# Patient Record
Sex: Male | Born: 1955 | Race: Black or African American | Hispanic: No | Smoking: Current every day smoker
Health system: Southern US, Community
[De-identification: ages and names within clinical notes are randomized; demographics above are authoritative.]

## PROBLEM LIST (undated history)

## (undated) DIAGNOSIS — I739 Peripheral vascular disease, unspecified: Secondary | ICD-10-CM

## (undated) DIAGNOSIS — H409 Unspecified glaucoma: Secondary | ICD-10-CM

## (undated) DIAGNOSIS — I509 Heart failure, unspecified: Secondary | ICD-10-CM

## (undated) DIAGNOSIS — N189 Chronic kidney disease, unspecified: Secondary | ICD-10-CM

## (undated) DIAGNOSIS — I779 Disorder of arteries and arterioles, unspecified: Secondary | ICD-10-CM

## (undated) DIAGNOSIS — I255 Ischemic cardiomyopathy: Secondary | ICD-10-CM

## (undated) DIAGNOSIS — I5020 Unspecified systolic (congestive) heart failure: Secondary | ICD-10-CM

## (undated) DIAGNOSIS — H5440 Blindness, one eye, unspecified eye: Secondary | ICD-10-CM

## (undated) DIAGNOSIS — E119 Type 2 diabetes mellitus without complications: Secondary | ICD-10-CM

## (undated) DIAGNOSIS — I10 Essential (primary) hypertension: Secondary | ICD-10-CM

## (undated) DIAGNOSIS — I2510 Atherosclerotic heart disease of native coronary artery without angina pectoris: Secondary | ICD-10-CM

## (undated) HISTORY — DX: Unspecified systolic (congestive) heart failure: I50.20

---

## 2020-03-27 ENCOUNTER — Ambulatory Visit (HOSPITAL_COMMUNITY)
Admission: EM | Admit: 2020-03-27 | Discharge: 2020-03-27 | Disposition: A | Discharge status: Home / Self Care | Payer: Self-pay

## 2020-03-27 ENCOUNTER — Other Ambulatory Visit: Payer: Self-pay

## 2020-03-27 ENCOUNTER — Encounter (HOSPITAL_COMMUNITY): Payer: Self-pay

## 2020-03-27 ENCOUNTER — Inpatient Hospital Stay (HOSPITAL_COMMUNITY)
Admission: EM | Admit: 2020-03-27 | Discharge: 2020-04-07 | DRG: 233 | Disposition: A | Discharge status: Home / Self Care | Payer: Self-pay | Attending: Cardiothoracic Surgery | Admitting: Cardiothoracic Surgery

## 2020-03-27 ENCOUNTER — Emergency Department (HOSPITAL_COMMUNITY): Payer: Self-pay

## 2020-03-27 DIAGNOSIS — I130 Hypertensive heart and chronic kidney disease with heart failure and stage 1 through stage 4 chronic kidney disease, or unspecified chronic kidney disease: Secondary | ICD-10-CM | POA: Diagnosis present

## 2020-03-27 DIAGNOSIS — F17210 Nicotine dependence, cigarettes, uncomplicated: Secondary | ICD-10-CM | POA: Diagnosis present

## 2020-03-27 DIAGNOSIS — N179 Acute kidney failure, unspecified: Secondary | ICD-10-CM | POA: Diagnosis present

## 2020-03-27 DIAGNOSIS — I255 Ischemic cardiomyopathy: Secondary | ICD-10-CM | POA: Diagnosis present

## 2020-03-27 DIAGNOSIS — Z7984 Long term (current) use of oral hypoglycemic drugs: Secondary | ICD-10-CM

## 2020-03-27 DIAGNOSIS — E1169 Type 2 diabetes mellitus with other specified complication: Secondary | ICD-10-CM | POA: Diagnosis present

## 2020-03-27 DIAGNOSIS — Z09 Encounter for follow-up examination after completed treatment for conditions other than malignant neoplasm: Secondary | ICD-10-CM

## 2020-03-27 DIAGNOSIS — K5900 Constipation, unspecified: Secondary | ICD-10-CM | POA: Diagnosis not present

## 2020-03-27 DIAGNOSIS — I440 Atrioventricular block, first degree: Secondary | ICD-10-CM | POA: Diagnosis present

## 2020-03-27 DIAGNOSIS — Z20822 Contact with and (suspected) exposure to covid-19: Secondary | ICD-10-CM | POA: Diagnosis present

## 2020-03-27 DIAGNOSIS — Z9889 Other specified postprocedural states: Secondary | ICD-10-CM

## 2020-03-27 DIAGNOSIS — L97509 Non-pressure chronic ulcer of other part of unspecified foot with unspecified severity: Secondary | ICD-10-CM | POA: Diagnosis present

## 2020-03-27 DIAGNOSIS — E1122 Type 2 diabetes mellitus with diabetic chronic kidney disease: Secondary | ICD-10-CM | POA: Diagnosis present

## 2020-03-27 DIAGNOSIS — I509 Heart failure, unspecified: Secondary | ICD-10-CM

## 2020-03-27 DIAGNOSIS — E119 Type 2 diabetes mellitus without complications: Secondary | ICD-10-CM

## 2020-03-27 DIAGNOSIS — R609 Edema, unspecified: Secondary | ICD-10-CM

## 2020-03-27 DIAGNOSIS — Z79899 Other long term (current) drug therapy: Secondary | ICD-10-CM

## 2020-03-27 DIAGNOSIS — M86672 Other chronic osteomyelitis, left ankle and foot: Secondary | ICD-10-CM | POA: Diagnosis present

## 2020-03-27 DIAGNOSIS — Z951 Presence of aortocoronary bypass graft: Secondary | ICD-10-CM

## 2020-03-27 DIAGNOSIS — D62 Acute posthemorrhagic anemia: Secondary | ICD-10-CM | POA: Diagnosis not present

## 2020-03-27 DIAGNOSIS — I5043 Acute on chronic combined systolic (congestive) and diastolic (congestive) heart failure: Secondary | ICD-10-CM | POA: Diagnosis present

## 2020-03-27 DIAGNOSIS — R0602 Shortness of breath: Secondary | ICD-10-CM

## 2020-03-27 DIAGNOSIS — M86671 Other chronic osteomyelitis, right ankle and foot: Secondary | ICD-10-CM | POA: Diagnosis present

## 2020-03-27 DIAGNOSIS — D631 Anemia in chronic kidney disease: Secondary | ICD-10-CM | POA: Diagnosis present

## 2020-03-27 DIAGNOSIS — E11621 Type 2 diabetes mellitus with foot ulcer: Secondary | ICD-10-CM | POA: Diagnosis present

## 2020-03-27 DIAGNOSIS — I25110 Atherosclerotic heart disease of native coronary artery with unstable angina pectoris: Principal | ICD-10-CM

## 2020-03-27 DIAGNOSIS — I10 Essential (primary) hypertension: Secondary | ICD-10-CM | POA: Diagnosis present

## 2020-03-27 DIAGNOSIS — L97519 Non-pressure chronic ulcer of other part of right foot with unspecified severity: Secondary | ICD-10-CM | POA: Diagnosis present

## 2020-03-27 DIAGNOSIS — N183 Chronic kidney disease, stage 3 unspecified: Secondary | ICD-10-CM | POA: Diagnosis present

## 2020-03-27 DIAGNOSIS — F1290 Cannabis use, unspecified, uncomplicated: Secondary | ICD-10-CM | POA: Diagnosis present

## 2020-03-27 DIAGNOSIS — L84 Corns and callosities: Secondary | ICD-10-CM | POA: Diagnosis present

## 2020-03-27 DIAGNOSIS — E785 Hyperlipidemia, unspecified: Secondary | ICD-10-CM | POA: Diagnosis present

## 2020-03-27 DIAGNOSIS — L97529 Non-pressure chronic ulcer of other part of left foot with unspecified severity: Secondary | ICD-10-CM | POA: Diagnosis present

## 2020-03-27 HISTORY — DX: Essential (primary) hypertension: I10

## 2020-03-27 HISTORY — DX: Type 2 diabetes mellitus without complications: E11.9

## 2020-03-27 LAB — CBC
HCT: 34 % — ABNORMAL LOW (ref 39.0–52.0)
Hemoglobin: 10.8 g/dL — ABNORMAL LOW (ref 13.0–17.0)
MCH: 31.8 pg (ref 26.0–34.0)
MCHC: 31.8 g/dL (ref 30.0–36.0)
MCV: 100 fL (ref 80.0–100.0)
Platelets: 286 10*3/uL (ref 150–400)
RBC: 3.4 MIL/uL — ABNORMAL LOW (ref 4.22–5.81)
RDW: 12.6 % (ref 11.5–15.5)
WBC: 6.7 10*3/uL (ref 4.0–10.5)
nRBC: 0 % (ref 0.0–0.2)

## 2020-03-27 LAB — BASIC METABOLIC PANEL
Anion gap: 10 (ref 5–15)
BUN: 19 mg/dL (ref 8–23)
CO2: 23 mmol/L (ref 22–32)
Calcium: 8.8 mg/dL — ABNORMAL LOW (ref 8.9–10.3)
Chloride: 105 mmol/L (ref 98–111)
Creatinine, Ser: 1.3 mg/dL — ABNORMAL HIGH (ref 0.61–1.24)
GFR, Estimated: >60 mL/min (ref >60)
Glucose, Bld: 122 mg/dL — ABNORMAL HIGH (ref 70–99)
Potassium: 4.3 mmol/L (ref 3.5–5.1)
Sodium: 138 mmol/L (ref 135–145)

## 2020-03-27 LAB — BRAIN NATRIURETIC PEPTIDE: B Natriuretic Peptide: 1383.1 pg/mL — ABNORMAL HIGH (ref 0.0–100.0)

## 2020-03-27 MED ORDER — INSULIN ASPART 100 UNIT/ML ~~LOC~~ SOLN
0.0000 [IU] | Freq: Three times a day (TID) | SUBCUTANEOUS | Status: DC
  Administered 2020-03-28: 10:00:00 3 [IU] via SUBCUTANEOUS
  Administered 2020-03-29: 8 [IU] via SUBCUTANEOUS
  Administered 2020-03-30: 3 [IU] via SUBCUTANEOUS
  Administered 2020-03-30 (×2): 2 [IU] via SUBCUTANEOUS
  Administered 2020-03-31: 3 [IU] via SUBCUTANEOUS
  Administered 2020-03-31: 2 [IU] via SUBCUTANEOUS
  Administered 2020-03-31 – 2020-04-02 (×5): 3 [IU] via SUBCUTANEOUS

## 2020-03-27 MED ORDER — ACETAMINOPHEN 325 MG PO TABS: 650.0000 mg | ORAL_TABLET | ORAL | Status: DC | PRN

## 2020-03-27 MED ORDER — FUROSEMIDE 10 MG/ML IJ SOLN
40.0000 mg | Freq: Every day | INTRAMUSCULAR | Status: DC
  Administered 2020-03-28: 40 mg via INTRAVENOUS
  Filled 2020-03-27: qty 4

## 2020-03-27 MED ORDER — ENOXAPARIN SODIUM 40 MG/0.4ML ~~LOC~~ SOLN
40.0000 mg | SUBCUTANEOUS | Status: DC
  Administered 2020-03-27: 40 mg via SUBCUTANEOUS
  Filled 2020-03-27: qty 0.4

## 2020-03-27 MED ORDER — SODIUM CHLORIDE 0.9% FLUSH: 3.0000 mL | INTRAVENOUS | Status: DC | PRN

## 2020-03-27 MED ORDER — ONDANSETRON HCL 4 MG/2ML IJ SOLN: 4.0000 mg | Freq: Four times a day (QID) | INTRAMUSCULAR | Status: DC | PRN

## 2020-03-27 MED ORDER — SODIUM CHLORIDE 0.9 % IV SOLN
250.0000 mL | INTRAVENOUS | Status: DC | PRN
  Administered 2020-03-29: 250 mL via INTRAVENOUS

## 2020-03-27 MED ORDER — FUROSEMIDE 10 MG/ML IJ SOLN
40.0000 mg | Freq: Once | INTRAMUSCULAR | Status: AC
  Administered 2020-03-27: 40 mg via INTRAVENOUS
  Filled 2020-03-27: qty 4

## 2020-03-27 MED ORDER — LISINOPRIL 5 MG PO TABS
5.0000 mg | ORAL_TABLET | Freq: Every day | ORAL | Status: DC
  Administered 2020-03-28 – 2020-03-30 (×3): 5 mg via ORAL
  Filled 2020-03-27 (×3): qty 1

## 2020-03-27 MED ORDER — SODIUM CHLORIDE 0.9% FLUSH
3.0000 mL | Freq: Two times a day (BID) | INTRAVENOUS | Status: DC
  Administered 2020-03-27 – 2020-03-28 (×2): 3 mL via INTRAVENOUS

## 2020-03-27 NOTE — ED Provider Notes (Addendum)
[facility]    CSN: [id number] Arrival date & time: 03/27/20  1426      History   Chief Complaint Chief Complaint  Patient presents with  . Hypotension  . Shortness of Breath  . Leg Swelling    HPI Curtis Watkins is a 65 y.o. male.   HPI   SOB: Pt states that he has had SOB, hypotension, leg swelling for the past 5 days. He reports that first he noticed hypotension episodes at home. He then held his hctz 25 mg medication for the last 5 days which he helped some. He then noticed weight gain of over 2 pounds per day, SOB when laying down, fatigue, peripheral edema and mild cough. Symptoms have not improved. He denies chest pains, weeping lesions of lower legs, productive cough.   Past Medical History:  Diagnosis Date  . Diabetes mellitus without complication (HCC)   . Hypertension     There are no problems to display for this patient.   History reviewed. No pertinent surgical history.     Home Medications    Prior to Admission medications   Medication Sig Start Date End Date Taking? Authorizing Provider  metFORMIN (GLUCOPHAGE) 1000 MG tablet Take by mouth. 12/12/19  Yes [provider]  hydrochlorothiazide (HYDRODIURIL) 25 MG tablet Take 25 mg by mouth daily. 12/12/19   [provider]    Family History History reviewed. No pertinent family history.  Social History Social History   Tobacco Use  . Smoking status: Current Every Day Smoker    Packs/day: 1.00    Types: Cigarettes  . Smokeless tobacco: Never Used  Substance Use Topics  . Alcohol use: Yes  . Drug use: Yes    Frequency: 3.0 times per week    Types: Marijuana     Allergies   Patient has no known allergies.   Review of Systems Review of Systems  As stated above in HPI  Physical Exam Triage Vital Signs ED Triage Vitals  Enc Vitals Group     BP 03/27/20 1444 115/73     Pulse Rate 03/27/20 1444 88     Resp 03/27/20 1444 (!) 28     Temp 03/27/20 1444 97.9 F  (36.6 C)     Temp Source 03/27/20 1444 Oral     SpO2 03/27/20 1444 100 %     Weight --      Height --      Head Circumference --      Peak Flow --      Pain Score 03/27/20 1441 0     Pain Loc --      Pain Edu? --      Excl. in GC? --    No data found.  Updated Vital Signs BP 115/73 (BP Location: Right Arm)   Pulse 88   Temp 97.9 F (36.6 C) (Oral)   Resp (!) 28   SpO2 100%  Resp repeated on exam were 22  Physical Exam Vitals and nursing note reviewed.  Constitutional:      General: He is not in acute distress.    Appearance: He is well-developed. He is not ill-appearing, toxic-appearing or diaphoretic.  HENT:     Head: Normocephalic.     Mouth/Throat:     Mouth: Mucous membranes are moist.  Cardiovascular:     Rate and Rhythm: Normal rate and regular rhythm.     Heart sounds: No murmur heard. No gallop.   Pulmonary:     Effort:  Pulmonary effort is normal. No tachypnea, bradypnea or respiratory distress.     Breath sounds: No stridor. Examination of the right-lower field reveals rhonchi. Examination of the left-lower field reveals rhonchi. Rhonchi present.  Chest:     Chest wall: No mass, deformity, tenderness, crepitus or edema.  Abdominal:     Palpations: Abdomen is soft.     Tenderness: There is no rebound.  Musculoskeletal:     Right lower leg: Edema present.     Left lower leg: Edema present.  Skin:    General: Skin is dry.  Neurological:     Mental Status: He is alert.      UC Treatments / Results  Labs (all labs ordered are listed, but only abnormal results are displayed) Labs Reviewed - No data to display  EKG   Radiology No results found.  Procedures Procedures (including critical care time)  Medications Ordered in UC Medications - No data to display  Initial Impression / Assessment and Plan / UC Course  I have reviewed the triage vital signs and the nursing notes.  Pertinent labs & imaging results that were available during my care  of the patient were reviewed by me and considered in my medical decision making (see chart for details).     New. Likely new onset CHF. Discussed with patient along with recommendation for treatment in the ER. Pt agreeable. Discussed red flag symptoms. He wishes to go via private vehicle.    Final Clinical Impressions(s) / UC Diagnoses   Final diagnoses:  Peripheral edema  SOB (shortness of breath)     Discharge Instructions     Please go directly to the ER    ED Prescriptions    None     PDMP not reviewed this encounter.   Rushie Chestnut, PA-C 03/27/20 [address] [place] 03/27/20 [phone]

## 2020-03-27 NOTE — Discharge Instructions (Addendum)
Please go directly to the ER.

## 2020-03-27 NOTE — ED Triage Notes (Addendum)
Pt presents with bilateral swelling in legs, shortness of breath when laying down and starts coughing days, shortness of breath on exertion x 2 days. Pt was told by the nurse at the PCP to be evaluated at the ED or UC, as PCP is out of the office. Denies chest pain, vision changes, weakness, fatigue, headache.  Pt took hydrochlorothiazide 25 mg, last time 6 days ago, as his blood pressure was low 80/47, 70/45.

## 2020-03-27 NOTE — ED Notes (Signed)
Patient transported to X-ray 

## 2020-03-27 NOTE — ED Triage Notes (Signed)
Patient sent from urgent care to be evaluated for bilateral leg swelling and SOB x 2 days. patient has no hx of CHF. Denies CP. Vaccinated and denies fever

## 2020-03-27 NOTE — ED Provider Notes (Signed)
[facility] EMERGENCY DEPARTMENT Provider Note   CSN: [id number] Arrival date & time: 03/27/20  1629     History No chief complaint on file.   Curtis Watkins is a 65 y.o. male with PMH of HTN and DM who presents the ED sent from urgent care for bilateral leg swelling and shortness of breath.    On my examination, patient reports that over the course the past few days, he has been feeling short of breath, particular with exertion.  He states that when he lays down flat at night, he feels as though he cannot breathe.  He also reports a nonproductive cough worse when lying flat.  He has noticed significant lower extremity swelling and edema which is new for him.  He denies any history of clots or clotting disorder.  No recent hemoptysis.  He reports that he experienced chills and cold symptoms a few weeks ago, but his symptoms improved.  He is fully immunized for COVID-19 including multiple booster injections.  Patient also states that he has been self treating his wounds on his feet bilaterally over the course of the past few weeks.  He states that he wanted to follow-up with his primary care provider, but no longer has insurance.  Patient denies any chest pain.  No pleuritic symptoms.  Of note, patient states that he is checking his blood sugars regularly and they have been relatively well controlled with Metformin.  While he had chills associated with his cold symptoms a few weeks ago, no recent fevers or chills.  He states that he had been experiencing hypotension at home and stopped taking his HCTZ medications.  HPI     Past Medical History:  Diagnosis Date  . Diabetes mellitus without complication (HCC)   . Hypertension     There are no problems to display for this patient.   History reviewed. No pertinent surgical history.     No family history on file.  Social History   Tobacco Use  . Smoking status: Current Every Day Smoker    Packs/day: 1.00    Types:  Cigarettes  . Smokeless tobacco: Never Used  Substance Use Topics  . Alcohol use: Yes  . Drug use: Yes    Frequency: 3.0 times per week    Types: Marijuana    Home Medications Prior to Admission medications   Medication Sig Start Date End Date Taking? Authorizing Provider  hydrochlorothiazide (HYDRODIURIL) 25 MG tablet Take 25 mg by mouth daily. 12/12/19   [provider]  metFORMIN (GLUCOPHAGE) 1000 MG tablet Take by mouth. 12/12/19   [provider]    Allergies    Patient has no known allergies.  Review of Systems   Review of Systems  All other systems reviewed and are negative.   Physical Exam Updated Vital Signs BP 129/78   Pulse 79   Temp 98.1 F (36.7 C)   Resp 16   Ht 5\' 11"  (1.803 m)   Wt 74.8 kg   SpO2 99%   BMI 23.01 kg/m   Physical Exam Vitals and nursing note reviewed. Exam conducted with a chaperone present.  Constitutional:      General: He is not in acute distress.    Appearance: He is ill-appearing. He is not toxic-appearing.  HENT:     Head: Normocephalic and atraumatic.  Eyes:     General: No scleral icterus.    Conjunctiva/sclera: Conjunctivae normal.  Cardiovascular:     Rate and Rhythm: Normal rate  and regular rhythm.     Pulses: Normal pulses.     Heart sounds: Normal heart sounds.  Pulmonary:     Effort: Pulmonary effort is normal. No respiratory distress.     Breath sounds: Normal breath sounds. No wheezing or rales.  Musculoskeletal:     Right lower leg: Edema present.     Left lower leg: Edema present.     Comments: 3+ pitting edema in lower extremities bilaterally. Fusiform swelling of second and third digits on right foot with wound on plantar aspect of distal toes.  No significant tenderness. Wound on plantar aspect of left foot. Peripheral pulses intact and symmetric. Can wiggle toes.  Sensation grossly intact throughout.  Skin:    General: Skin is dry.  Neurological:     Mental Status: He is alert.      GCS: GCS eye subscore is 4. GCS verbal subscore is 5. GCS motor subscore is 6.  Psychiatric:        Mood and Affect: Mood normal.        Behavior: Behavior normal.        Thought Content: Thought content normal.          No  ED Results / Procedures / Treatments   Labs (all labs ordered are listed, but only abnormal results are displayed) Labs Reviewed  BASIC METABOLIC PANEL - Abnormal; Notable for the following components:      Result Value   Glucose, Bld 122 (*)    Creatinine, Ser 1.30 (*)    Calcium 8.8 (*)    All other components within normal limits  CBC - Abnormal; Notable for the following components:   RBC 3.40 (*)    Hemoglobin 10.8 (*)    HCT 34.0 (*)    All other components within normal limits  BRAIN NATRIURETIC PEPTIDE - Abnormal; Notable for the following components:   B Natriuretic Peptide 1,383.1 (*)    All other components within normal limits  SARS CORONAVIRUS 2 (TAT 6-24 HRS)  TROPONIN I (HIGH SENSITIVITY)    EKG None  Radiology DG Chest 2 View  Result Date: 03/27/2020 CLINICAL DATA:  65 year old male with shortness of breath. EXAM: CHEST - 2 VIEW COMPARISON:  Chest radiograph dated 03/15/2016 FINDINGS: The small bilateral pleural effusions with bibasilar atelectasis or infiltrate. No pneumothorax. The cardiac silhouette is within limits. No acute osseous pathology. Atherosclerotic calcification of the aorta. IMPRESSION: Small bilateral pleural effusions with bibasilar atelectasis or infiltrate. Electronically Signed   By: Elgie Collard M.D.   On: 03/27/2020 17:23   DG Foot Complete Left  Result Date: 03/27/2020 CLINICAL DATA:  Wounds on the second and third digits EXAM: LEFT FOOT - COMPLETE 3+ VIEW COMPARISON:  None. FINDINGS: There is no evidence of fracture or dislocation. There is no evidence of arthropathy or other focal bone abnormality. Mild soft tissue swelling seen around the third through fifth distal phalanges. IMPRESSION: Negative.  Electronically Signed   By: Jonna Clark M.D.   On: 03/27/2020 23:04   DG Foot Complete Right  Result Date: 03/27/2020 CLINICAL DATA:  Wounds second third digit EXAM: RIGHT FOOT COMPLETE - 3+ VIEW COMPARISON:  None. FINDINGS: There is cortical destruction and erosive type change seen at the distal phalanx the second third digits. There is overlying tissue swelling and nailbed irregularity noted. No displaced fracture is identified. Dorsal soft tissue swelling is seen. 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 Electronically Signed   By: Kandis Fantasia  Avutu M.D.   On: 03/27/2020 23:03    Procedures Procedures   Medications Ordered in ED Medications  furosemide (LASIX) injection 40 mg (has no administration in time range)    ED Course  I have reviewed the triage vital signs and the nursing notes.  Pertinent labs & imaging results that were available during my care of the patient were reviewed by me and considered in my medical decision making (see chart for details).  Clinical Course as of 03/27/20 2321  Wed Mar 27, 2020  2316 I spoke with Dr. Julian Reil.  He will see and admit patient.  His EKG is concerning for ischemia and if his new onset CHF was precipitated by an MI he will need to be admitted by cardiology.  [GG]    Clinical Course User Index [GG] Lorelee New, PA-C   MDM Rules/Calculators/A&P                          Curtis Watkins was evaluated in Emergency Department on 03/27/2020 for the symptoms described in the history of present illness. He was evaluated in the context of the global COVID-19 pandemic, which necessitated consideration that the patient might be at risk for infection with the SARS-CoV-2 virus that causes COVID-19. Institutional protocols and algorithms that pertain to the evaluation of patients at risk for COVID-19 are in a state of rapid change based on information released by regulatory bodies including the CDC and  federal and state organizations. These policies and algorithms were followed during the patient's care in the ED.  I personally reviewed patient's medical chart and all notes from triage and staff during today's encounter. I have also ordered and reviewed all labs and imaging that I felt to be medically necessary in the evaluation of this patient's complaints and with consideration of their with their physical exam. If needed, translation services were available and utilized.   Patient's history physical exam is suggestive of new onset CHF.  Small bilateral effusions noted on chest x-ray.  We will start patient on 40 mg Lasix IV and admit to hospitalist services.  Patient would benefit from echocardiogram.  We will also obtain plain films of feet given diabetic foot ulcers and concern for osteomyelitis.  Patient reported fevers and chills a few weeks ago, but attributed to cold symptoms.  Vital signs stable here in the ED and no leukocytosis.  Do not feel as though cultures or antibiotics are warranted at this time.  He has poor outpatient follow-up given lack of insurance at this time.  Will consult hospitalist given that unassigned medicine has been capped for this evening.  I spoke with Dr. Julian Reil.  He will see and admit.  His EKG is concerning for ischemia and if his new onset CHF was precipitated by an MI he will need to be admitted by cardiology.     Final Clinical Impression(s) / ED Diagnoses Final diagnoses:  Acute congestive heart failure, unspecified heart failure type [facility])    Rx / DC Orders ED Discharge Orders    None       Elvera Maria 03/27/20 2321    Gerhard Munch, MD 03/28/20 0005

## 2020-03-27 NOTE — H&P (Signed)
History and Physical    Curtis Watkins LXB:[id number] DOB: 1955/09/12 DOA: 03/27/2020  PCP: Doran Stabler, NP  Patient coming from: Home  I have personally briefly reviewed patient's old medical records in [facility]  Chief Complaint: SOB, leg swelling  HPI: Curtis Watkins is a 65 y.o. male with medical history significant of DM2, HTN.  Pt sent to ED from UC for new onset of BLE edema and SOB.  Symptoms onset over the past few days.  He had recently stopped taking HCTZ after noting his BP was low at home.  Since that time he has had progressive BLE edema, DOE, orthopnea.  Pt is COVID vaccinated and boosted.  Has had wounds on BLE feet over course of past couple of weeks.  No CP, no abd pain.   ED Course: BNP 1383.  Creat 1.3.  CXR = small B pleural effusions with bibasilar atelectasis or infiltrate.  COVID pending.   Review of Systems: As per HPI, otherwise all review of systems negative.  Past Medical History:  Diagnosis Date  . Diabetes mellitus without complication (HCC)   . Hypertension     History reviewed. No pertinent surgical history.   reports that he has been smoking cigarettes. He has been smoking about 1.00 pack per day. He has never used smokeless tobacco. He reports current alcohol use. He reports current drug use. Frequency: 3.00 times per week. Drug: Marijuana.  No Known Allergies  No family history on file.   Prior to Admission medications   Medication Sig Start Date End Date Taking? Authorizing Provider  hydrochlorothiazide (HYDRODIURIL) 25 MG tablet Take 25 mg by mouth daily. 12/12/19   [provider]  metFORMIN (GLUCOPHAGE) 1000 MG tablet Take by mouth. 12/12/19   [provider]    Physical Exam: Vitals:   03/27/20 1654 03/27/20 1812 03/27/20 2029 03/27/20 2208  BP: 119/81 120/85 110/68 129/78  Pulse: 80 82 77 79  Resp: 20 18 18 16   Temp: 99.2 F (37.3 C)  98.9 F (37.2 C) 98.1 F (36.7 C)  TempSrc: Oral  Oral    SpO2: 100% 100% 99% 99%  Weight: 74.8 kg     Height: 5\' 11"  (1.803 m)       Constitutional: NAD, calm, comfortable Eyes: PERRL, lids and conjunctivae normal ENMT: Mucous membranes are moist. Posterior pharynx clear of any exudate or lesions.Normal dentition.  Neck: normal, supple, no masses, no thyromegaly Respiratory: clear to auscultation bilaterally, no wheezing, no crackles. Normal respiratory effort. No accessory muscle use.  Cardiovascular: Regular rate and rhythm, no murmurs / rubs / gallops. 3+ BLE edema. 2+ pedal pulses. No carotid bruits.  Abdomen: no tenderness, no masses palpated. No hepatosplenomegaly. Bowel sounds positive.  Musculoskeletal: no clubbing / cyanosis. No joint deformity upper and lower extremities. Good ROM, no contractures. Normal muscle tone.  Skin:      Neurologic: CN 2-12 grossly intact. Sensation intact, DTR normal. Strength 5/5 in all 4.  Psychiatric: Normal judgment and insight. Alert and oriented x 3. Normal mood.    Labs on Admission: I have personally reviewed following labs and imaging studies  CBC: Recent Labs  Lab 03/27/20 1701  WBC 6.7  HGB 10.8*  HCT 34.0*  MCV 100.0  PLT 286   Basic Metabolic Panel: Recent Labs  Lab 03/27/20 1701  NA 138  K 4.3  CL 105  CO2 23  GLUCOSE 122*  BUN 19  CREATININE 1.30*  CALCIUM 8.8*   GFR: Estimated Creatinine  Clearance: 60.7 mL/min (A) (by C-G formula based on SCr of 1.3 mg/dL (H)). Liver Function Tests: No results for input(s): AST, ALT, ALKPHOS, BILITOT, PROT, ALBUMIN in the last 168 hours. No results for input(s): LIPASE, AMYLASE in the last 168 hours. No results for input(s): AMMONIA in the last 168 hours. Coagulation Profile: No results for input(s): INR, PROTIME in the last 168 hours. Cardiac Enzymes: No results for input(s): CKTOTAL, CKMB, CKMBINDEX, TROPONINI in the last 168 hours. BNP (last 3 results) No results for input(s): PROBNP in the last 8760 hours. HbA1C: No  results for input(s): HGBA1C in the last 72 hours. CBG: No results for input(s): GLUCAP in the last 168 hours. Lipid Profile: No results for input(s): CHOL, HDL, LDLCALC, TRIG, CHOLHDL, LDLDIRECT in the last 72 hours. Thyroid Function Tests: No results for input(s): TSH, T4TOTAL, FREET4, T3FREE, THYROIDAB in the last 72 hours. Anemia Panel: No results for input(s): VITAMINB12, FOLATE, FERRITIN, TIBC, IRON, RETICCTPCT in the last 72 hours. Urine analysis: No results found for: COLORURINE, APPEARANCEUR, LABSPEC, PHURINE, GLUCOSEU, HGBUR, BILIRUBINUR, KETONESUR, PROTEINUR, UROBILINOGEN, NITRITE, LEUKOCYTESUR  Radiological Exams on Admission: DG Chest 2 View  Result Date: 03/27/2020 CLINICAL DATA:  65 year old male with shortness of breath. EXAM: CHEST - 2 VIEW COMPARISON:  Chest radiograph dated 03/15/2016 FINDINGS: The small bilateral pleural effusions with bibasilar atelectasis or infiltrate. No pneumothorax. The cardiac silhouette is within limits. No acute osseous pathology. Atherosclerotic calcification of the aorta. IMPRESSION: Small bilateral pleural effusions with bibasilar atelectasis or infiltrate. Electronically Signed   By: Elgie Collard M.D.   On: 03/27/2020 17:23   DG Foot Complete Left  Result Date: 03/27/2020 CLINICAL DATA:  Wounds on the second and third digits EXAM: LEFT FOOT - COMPLETE 3+ VIEW COMPARISON:  None. FINDINGS: There is no evidence of fracture or dislocation. There is no evidence of arthropathy or other focal bone abnormality. Mild soft tissue swelling seen around the third through fifth distal phalanges. IMPRESSION: Negative. Electronically Signed   By: Jonna Clark M.D.   On: 03/27/2020 23:04   DG Foot Complete Right  Result Date: 03/27/2020 CLINICAL DATA:  Wounds second third digit EXAM: RIGHT FOOT COMPLETE - 3+ VIEW COMPARISON:  None. FINDINGS: There is cortical destruction and erosive type change seen at the distal phalanx the second third digits. There is  overlying tissue swelling and nailbed irregularity noted. No displaced fracture is identified. Dorsal soft tissue swelling is seen. 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 Electronically Signed   By: Jonna Clark M.D.   On: 03/27/2020 23:03    EKG: Independently reviewed. TWI in lateral leads.  ? LVH.  ? Some amount of early repol vs LVH causing ST elevations in leads V2 and V3?  Assessment/Plan Principal Problem:   New onset of congestive heart failure (HCC) Active Problems:   Diabetic foot ulcers (HCC)   DM2 (diabetes mellitus, type 2) (HCC)   HTN (hypertension)    1. New onset CHF - 1. CHF pathway 2. Lasix 40mg  IV x1 now then 40mg  IV daily 3. BMP daily 4. Strict intake and output 5. 2d echo ordered and pending 6. Tele monitor 7. Trop ordered and pending 1. STEMI felt unlikely by myself and Dr. after reviewing the EKG. 2. Immediate cards consult if significant elevation however. 2. HTN - 1. Will go ahead and start lisinopril 5mg  2. Stop HCTZ 3. Monitor renal function with lisinopril 3. DM2 - 1. Hold metformin 2. Mod scale SSI AC  for the moment 4. Diabetic foot ulcers - 1. No osteo on plain films 2. dont appear grossly infected 3. Wound care consult  DVT prophylaxis: Lovenox Code Status: Full Family Communication: No family in room Disposition Plan: Home after CHF work up Consults called: None Admission status: Admit to inpatient  Severity of Illness: The appropriate patient status for this patient is INPATIENT. Inpatient status is judged to be reasonable and necessary in order to provide the required intensity of service to ensure the patient's safety. The patient's presenting symptoms, physical exam findings, and initial radiographic and laboratory data in the context of their chronic comorbidities is felt to place them at high risk for further clinical deterioration. Furthermore, it is not anticipated  that the patient will be medically stable for discharge from the hospital within 2 midnights of admission. The following factors support the patient status of inpatient.   IP status for new onset CHF.   * I certify that at the point of admission it is my clinical judgment that the patient will require inpatient hospital care spanning beyond 2 midnights from the point of admission due to high intensity of service, high risk for further deterioration and high frequency of surveillance required.*    Ashonte Angelucci M. DO [facility]  How to contact the [facility] Attending or Consulting provider 7A - 7P or covering provider during after hours 7P -7A, for this patient?  1. Check the care team in [facility] and look for a) attending/consulting [facility] provider listed and b) the [facility] team listed 2. Log into [email or web address]  Am[employer] Physician Scheduling and messaging for groups and whole hospitals  On call and physician scheduling software for group practices, residents, hospitalists and other medical providers for call, clinic, rotation and shift schedules. On[employer] is a hospital-wide system for scheduling doctors and paging doctors on call. EasyPlot is for scientific plotting and data analysis.  [email or web address]  and use [facility]'s universal password to access. If you do not have the password, please contact the hospital operator.  3. Locate the [facility] provider you are looking for under [facility] and page to a number that you can be directly reached. 4. If you still have difficulty reaching the provider, please page the [facility] (Director on Call) for the Hospitalists listed on amion for assistance.  03/27/2020, 11:31 PM

## 2020-03-28 ENCOUNTER — Inpatient Hospital Stay (HOSPITAL_COMMUNITY): Payer: Self-pay

## 2020-03-28 DIAGNOSIS — I5041 Acute combined systolic (congestive) and diastolic (congestive) heart failure: Secondary | ICD-10-CM

## 2020-03-28 DIAGNOSIS — I10 Essential (primary) hypertension: Secondary | ICD-10-CM

## 2020-03-28 DIAGNOSIS — I214 Non-ST elevation (NSTEMI) myocardial infarction: Secondary | ICD-10-CM

## 2020-03-28 DIAGNOSIS — L97509 Non-pressure chronic ulcer of other part of unspecified foot with unspecified severity: Secondary | ICD-10-CM

## 2020-03-28 DIAGNOSIS — I5021 Acute systolic (congestive) heart failure: Secondary | ICD-10-CM

## 2020-03-28 LAB — HEMOGLOBIN A1C
Hgb A1c MFr Bld: 6.8 % — ABNORMAL HIGH (ref 4.8–5.6)
Mean Plasma Glucose: 148.46 mg/dL

## 2020-03-28 LAB — ECHOCARDIOGRAM COMPLETE
Area-P 1/2: 3.72 cm2
Calc EF: 41 %
Height: 71 in
S' Lateral: 3.5 cm
Single Plane A2C EF: 41.4 %
Single Plane A4C EF: 40.3 %
Weight: 2640 oz

## 2020-03-28 LAB — TROPONIN I (HIGH SENSITIVITY)
Troponin I (High Sensitivity): 353 ng/L (ref <18)
Troponin I (High Sensitivity): 337 ng/L (ref <18)
Troponin I (High Sensitivity): 309 ng/L (ref <18)

## 2020-03-28 LAB — BASIC METABOLIC PANEL
Anion gap: 8 (ref 5–15)
BUN: 18 mg/dL (ref 8–23)
CO2: 24 mmol/L (ref 22–32)
Calcium: 8.7 mg/dL — ABNORMAL LOW (ref 8.9–10.3)
Chloride: 105 mmol/L (ref 98–111)
Creatinine, Ser: 1.29 mg/dL — ABNORMAL HIGH (ref 0.61–1.24)
GFR, Estimated: >60 mL/min (ref >60)
Glucose, Bld: 115 mg/dL — ABNORMAL HIGH (ref 70–99)
Potassium: 3.9 mmol/L (ref 3.5–5.1)
Sodium: 137 mmol/L (ref 135–145)

## 2020-03-28 LAB — CBC
HCT: 33.7 % — ABNORMAL LOW (ref 39.0–52.0)
Hemoglobin: 11.3 g/dL — ABNORMAL LOW (ref 13.0–17.0)
MCH: 31.7 pg (ref 26.0–34.0)
MCHC: 33.5 g/dL (ref 30.0–36.0)
MCV: 94.7 fL (ref 80.0–100.0)
Platelets: 254 10*3/uL (ref 150–400)
RBC: 3.56 MIL/uL — ABNORMAL LOW (ref 4.22–5.81)
RDW: 12.3 % (ref 11.5–15.5)
WBC: 6.6 10*3/uL (ref 4.0–10.5)
nRBC: 0 % (ref 0.0–0.2)

## 2020-03-28 LAB — SARS CORONAVIRUS 2 (TAT 6-24 HRS): SARS Coronavirus 2: NEGATIVE

## 2020-03-28 LAB — HEPARIN LEVEL (UNFRACTIONATED): Heparin Unfractionated: <0.1 IU/mL — ABNORMAL LOW (ref 0.30–0.70)

## 2020-03-28 LAB — HIV ANTIBODY (ROUTINE TESTING W REFLEX): HIV Screen 4th Generation wRfx: NONREACTIVE

## 2020-03-28 LAB — CREATININE, SERUM
Creatinine, Ser: 1.36 mg/dL — ABNORMAL HIGH (ref 0.61–1.24)
GFR, Estimated: 58 mL/min — ABNORMAL LOW (ref >60)

## 2020-03-28 LAB — CBG MONITORING, ED
Glucose-Capillary: 171 mg/dL — ABNORMAL HIGH (ref 70–99)
Glucose-Capillary: 81 mg/dL (ref 70–99)

## 2020-03-28 LAB — GLUCOSE, CAPILLARY: Glucose-Capillary: 189 mg/dL — ABNORMAL HIGH (ref 70–99)

## 2020-03-28 MED ORDER — HYDROCERIN EX CREA
TOPICAL_CREAM | Freq: Every day | CUTANEOUS | Status: DC
  Administered 2020-04-05: 1 via TOPICAL
  Filled 2020-03-28 (×3): qty 113

## 2020-03-28 MED ORDER — ASPIRIN EC 81 MG PO TBEC
81.0000 mg | DELAYED_RELEASE_TABLET | Freq: Every day | ORAL | Status: DC
  Administered 2020-03-29 – 2020-04-02 (×5): 81 mg via ORAL
  Filled 2020-03-28 (×5): qty 1

## 2020-03-28 MED ORDER — SODIUM CHLORIDE 0.9 % IV SOLN: INTRAVENOUS | Status: DC

## 2020-03-28 MED ORDER — FUROSEMIDE 10 MG/ML IJ SOLN
40.0000 mg | Freq: Two times a day (BID) | INTRAMUSCULAR | Status: DC
  Administered 2020-03-28 – 2020-03-30 (×3): 40 mg via INTRAVENOUS
  Filled 2020-03-28 (×3): qty 4

## 2020-03-28 MED ORDER — HEPARIN (PORCINE) 25000 UT/250ML-% IV SOLN
1300.0000 [IU]/h | INTRAVENOUS | Status: DC
  Filled 2020-03-28: qty 250

## 2020-03-28 MED ORDER — SODIUM CHLORIDE 0.9% FLUSH: 3.0000 mL | INTRAVENOUS | Status: DC | PRN

## 2020-03-28 MED ORDER — ASPIRIN 325 MG PO TABS
325.0000 mg | ORAL_TABLET | Freq: Once | ORAL | Status: AC
  Administered 2020-03-28: 325 mg via ORAL
  Filled 2020-03-28: qty 1

## 2020-03-28 MED ORDER — HEPARIN (PORCINE) 25000 UT/250ML-% IV SOLN
900.0000 [IU]/h | INTRAVENOUS | Status: DC
  Administered 2020-03-28: 900 [IU]/h via INTRAVENOUS
  Filled 2020-03-28: qty 250

## 2020-03-28 MED ORDER — SODIUM CHLORIDE 0.9% FLUSH
3.0000 mL | Freq: Two times a day (BID) | INTRAVENOUS | Status: DC
  Administered 2020-03-28: 3 mL via INTRAVENOUS

## 2020-03-28 MED ORDER — SODIUM CHLORIDE 0.9 % IV SOLN: 250.0000 mL | INTRAVENOUS | Status: DC | PRN

## 2020-03-28 MED ORDER — ATORVASTATIN CALCIUM 80 MG PO TABS
80.0000 mg | ORAL_TABLET | Freq: Every day | ORAL | Status: DC
  Administered 2020-03-29 – 2020-04-07 (×9): 80 mg via ORAL
  Filled 2020-03-28 (×9): qty 1

## 2020-03-28 MED ORDER — ASPIRIN EC 81 MG PO TBEC: 81.0000 mg | DELAYED_RELEASE_TABLET | Freq: Every day | ORAL | Status: DC

## 2020-03-28 NOTE — Consult Note (Signed)
Reason for Consult:Right foot osteo Referring Physician: Nunzio CoryM Mikhail Time called: 1545 Time at bedside: 1548   Curtis Watkins is an 65 y.o. male.  HPI: Curtis Watkins came to the ED 2/2 BLE edema and was found to be in new onset CHF. During examination it was noted that he had some foot ulcerations and swelling and x-rays were obtained. 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 He denies pain, fevers, chills, sweats, N/V, and says that he gets ulcers from time to time.  Past Medical History:  Diagnosis Date  . Diabetes mellitus without complication (HCC)   . Hypertension     History reviewed. No pertinent surgical history.  No family history on file.  Social History:  reports that he has been smoking cigarettes. He has been smoking about 1.00 pack per day. He has never used smokeless tobacco. He reports current alcohol use. He reports current drug use. Frequency: 3.00 times per week. Drug: Marijuana.  Allergies: No Known Allergies  Medications: I have reviewed the patient's current medications.  Results for orders placed or performed during the hospital encounter of 03/27/20 (from the past 48 hour(s))  Basic metabolic panel     Status: Abnormal   Collection Time: 03/27/20  5:01 PM  Result Value Ref Range   Sodium 138 135 - 145 mmol/L   Potassium 4.3 3.5 - 5.1 mmol/L   Chloride 105 98 - 111 mmol/L   CO2 23 22 - 32 mmol/L   Glucose, Bld 122 (H) 70 - 99 mg/dL    Comment: Glucose reference range applies only to samples taken after fasting for at least 8 hours.   BUN 19 8 - 23 mg/dL   Creatinine, Ser 1.611.30 (H) 0.61 - 1.24 mg/dL   Calcium 8.8 (L) 8.9 - 10.3 mg/dL   GFR, Estimated >09>60 >60>60 mL/min    Comment: (NOTE) Calculated using the CKD-EPI Creatinine Equation (2021)    Anion gap 10 5 - 15    Comment: Performed at [facility], [address] [place], [place] [address]  CBC     Status: Abnormal   Collection Time: 03/27/20  5:01 PM  Result  Value Ref Range   WBC 6.7 4.0 - 10.5 K/uL   RBC 3.40 (L) 4.22 - 5.81 MIL/uL   Hemoglobin 10.8 (L) 13.0 - 17.0 g/dL   HCT 81.134.0 (L) 91.439.0 - 78.252.0 %   MCV 100.0 80.0 - 100.0 fL   MCH 31.8 26.0 - 34.0 pg   MCHC 31.8 30.0 - 36.0 g/dL   RDW 95.612.6 21.311.5 - 08.615.5 %   Platelets 286 150 - 400 K/uL   nRBC 0.0 0.0 - 0.2 %    Comment: Performed at [facility], [address] [place], [place] [address]  Brain natriuretic peptide     Status: Abnormal   Collection Time: 03/27/20  6:11 PM  Result Value Ref Range   B Natriuretic Peptide 1,383.1 (H) 0.0 - 100.0 pg/mL    Comment: Performed at [facility], [address] [place], [place] [address]  SARS CORONAVIRUS 2 (TAT 6-24 HRS) Nasopharyngeal Nasopharyngeal Swab     Status: None   Collection Time: 03/27/20 10:33 PM   Specimen: Nasopharyngeal Swab  Result Value Ref Range   SARS Coronavirus 2 NEGATIVE NEGATIVE    Comment: (NOTE) SARS-CoV-2 target nucleic acids are NOT DETECTED.  The SARS-CoV-2 RNA is generally detectable in upper and lower respiratory specimens during the acute  phase of infection. Negative results do not preclude SARS-CoV-2 infection, do not rule out co-infections with other pathogens, and should not be used as the sole basis for treatment or other patient management decisions. Negative results must be combined with clinical observations, patient history, and epidemiological information. The expected result is Negative.  Fact Sheet for Patients: HairSlick.no  Fact Sheet for Healthcare Providers: quierodirigir.com  This test is not yet approved or cleared by the [place] FDA and  has been authorized for detection and/or diagnosis of SARS-CoV-2 by FDA under an Emergency Use Authorization (EUA). This EUA will remain  in effect (meaning this test can be used) for the duration of the COVID-19 declaration under Se ction 564(b)(1) of the Act, 21 U.S.C. section  360bbb-3(b)(1), unless the authorization is terminated or revoked sooner.  Performed at [facility], [address] [place], [place] [address]   Troponin I (High Sensitivity)     Status: Abnormal   Collection Time: 03/27/20 11:37 PM  Result Value Ref Range   Troponin I (High Sensitivity) 353 (HH) <18 ng/L    Comment: CRITICAL RESULT CALLED TO, READ BACK BY AND VERIFIED WITH: CHRISTINE CHRISCO RN [phone] M GARRETT (NOTE) Elevated high sensitivity troponin I (hsTnI) values and significant  changes across serial measurements may suggest ACS but many other  chronic and acute conditions are known to elevate hsTnI results.  Refer to the Links section for chest pain algorithms and additional  guidance. Performed at [facility], [address] [place], [place] [address]   Creatinine, serum     Status: Abnormal   Collection Time: 03/27/20 11:37 PM  Result Value Ref Range   Creatinine, Ser 1.36 (H) 0.61 - 1.24 mg/dL   GFR, Estimated 58 (L) >60 mL/min    Comment: (NOTE) Calculated using the CKD-EPI Creatinine Equation (2021) Performed at [facility], [address] [place], [place] [address]   Troponin I (High Sensitivity)     Status: Abnormal   Collection Time: 03/28/20  1:46 AM  Result Value Ref Range   Troponin I (High Sensitivity) 337 (HH) <18 ng/L    Comment: CRITICAL VALUE NOTED.  VALUE IS CONSISTENT WITH PREVIOUSLY REPORTED AND CALLED VALUE. (NOTE) Elevated high sensitivity troponin I (hsTnI) values and significant  changes across serial measurements may suggest ACS but many other  chronic and acute conditions are known to elevate hsTnI results.  Refer to the Links section for chest pain algorithms and additional  guidance. Performed at [facility], [address] [place], [place] [address]   Basic metabolic panel     Status: Abnormal   Collection Time: 03/28/20  4:29 AM  Result Value Ref Range   Sodium 137 135 - 145 mmol/L   Potassium  3.9 3.5 - 5.1 mmol/L   Chloride 105 98 - 111 mmol/L   CO2 24 22 - 32 mmol/L   Glucose, Bld 115 (H) 70 - 99 mg/dL    Comment: Glucose reference range applies only to samples taken after fasting for at least 8 hours.   BUN 18 8 - 23 mg/dL   Creatinine, Ser 0.97 (H) 0.61 - 1.24 mg/dL   Calcium 8.7 (L) 8.9 - 10.3 mg/dL   GFR, Estimated >35 >32 mL/min    Comment: (NOTE) Calculated using the CKD-EPI Creatinine Equation (2021)    Anion gap 8 5 - 15    Comment: Performed at [facility], [address], [place], [place] [address]  CBG monitoring, ED     Status: Abnormal   Collection Time: 03/28/20  9:15 AM  Result Value Ref Range   Glucose-Capillary 171 (H) 70 - 99 mg/dL    Comment: Glucose reference range applies only to samples taken after fasting for at least 8 hours.  CBG monitoring, ED     Status: None   Collection Time: 03/28/20 12:13 PM  Result Value Ref Range   Glucose-Capillary 81 70 - 99 mg/dL    Comment: Glucose reference range applies only to samples taken after fasting for at least 8 hours.    DG Chest 2 View  Result Date: 03/27/2020 CLINICAL DATA:  65 year old male with shortness of breath. EXAM: CHEST - 2 VIEW COMPARISON:  Chest radiograph dated 03/15/2016 FINDINGS: The small bilateral pleural effusions with bibasilar atelectasis or infiltrate. No pneumothorax. The cardiac silhouette is within limits. No acute osseous pathology. Atherosclerotic calcification of the aorta. IMPRESSION: Small bilateral pleural effusions with bibasilar atelectasis or infiltrate. Electronically Signed   By: Elgie Collard M.D.   On: 03/27/2020 17:23   DG Foot Complete Left  Result Date: 03/27/2020 CLINICAL DATA:  Wounds on the second and third digits EXAM: LEFT FOOT - COMPLETE 3+ VIEW COMPARISON:  None. FINDINGS: There is no evidence of fracture or dislocation. There is no evidence of arthropathy or other focal bone abnormality. Mild soft tissue swelling seen around the third through fifth  distal phalanges. IMPRESSION: Negative. Electronically Signed   By: Jonna Clark M.D.   On: 03/27/2020 23:04   DG Foot Complete Right  Result Date: 03/27/2020 CLINICAL DATA:  Wounds second third digit EXAM: RIGHT FOOT COMPLETE - 3+ VIEW COMPARISON:  None. FINDINGS: There is cortical destruction and erosive type change seen at the distal phalanx the second third digits. There is overlying tissue swelling and nailbed irregularity noted. No displaced fracture is identified. Dorsal soft tissue swelling is seen. 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 Electronically Signed   By: Jonna Clark M.D.   On: 03/27/2020 23:03   ECHOCARDIOGRAM COMPLETE  Result Date: 03/28/2020    ECHOCARDIOGRAM REPORT   Patient Name:   Curtis Watkins Date of Exam: 03/28/2020 Medical Rec #:  [id number]   Height:       71.0 in Accession #:    [id number]  Weight:       165.0 lb Date of Birth:  01-01-1956  BSA:          1.943 m Patient Age:    64 years    BP:           123/80 mmHg Patient Gender: M           HR:           75 bpm. Exam Location:  Inpatient Procedure: 2D Echo, Color Doppler and Cardiac Doppler Indications:    I50.21 Acute systolic (congestive) heart failure  History:        Patient has no prior history of Echocardiogram examinations.                 Risk Factors:Hypertension and Diabetes.  Sonographer:    Irving Burton Senior RDCS Referring Phys: [phone] JARED M GARDNER IMPRESSIONS  1. Left ventricular ejection fraction, by estimation, is 40 to 45%. The left ventricle has mildly decreased function. There is mild left ventricular hypertrophy. Left ventricular diastolic parameters are consistent with Grade II diastolic dysfunction (pseudonormalization). Elevated left atrial pressure.  2. Right ventricular systolic function is normal.  The right ventricular size is normal.  3. Left atrial size was mildly dilated.  4. Mild mitral valve regurgitation.  5. The inferior vena cava is dilated in  size with <50% respiratory variability, suggesting right atrial pressure of 15 mmHg. FINDINGS  Left Ventricle: LVEF is depressed with hypokineis of the distal septal, distal inferior, distal lateral walls; akinesis at very apex. Left ventricular ejection fraction, by estimation, is 40 to 45%. The left ventricle has mildly decreased function. The left ventricle demonstrates regional wall motion abnormalities. The left ventricular internal cavity size was normal in size. There is mild left ventricular hypertrophy. Left ventricular diastolic parameters are consistent with Grade II diastolic dysfunction (pseudonormalization). Elevated left atrial pressure. Right Ventricle: The right ventricular size is normal. Right vetricular wall thickness was not assessed. Right ventricular systolic function is normal. Left Atrium: Left atrial size was mildly dilated. Right Atrium: Right atrial size was normal in size. Pericardium: There is no evidence of pericardial effusion. Mitral Valve: The mitral valve is normal in structure. Mild mitral valve regurgitation. Tricuspid Valve: The tricuspid valve is normal in structure. Tricuspid valve regurgitation is trivial. Aortic Valve: The aortic valve is normal in structure. Aortic valve regurgitation is not visualized. Pulmonic Valve: The pulmonic valve was grossly normal. Pulmonic valve regurgitation is not visualized. Aorta: The aortic root is normal in size and structure. Venous: The inferior vena cava is dilated in size with less than 50% respiratory variability, suggesting right atrial pressure of 15 mmHg. IAS/Shunts: No atrial level shunt detected by color flow Doppler.  LEFT VENTRICLE PLAX 2D LVIDd:         4.60 cm      Diastology LVIDs:         3.50 cm      LV e' medial:    3.92 cm/s LV PW:         1.20 cm      LV E/e' medial:  32.1 LV IVS:        1.10 cm      LV e' lateral:   5.44 cm/s LVOT diam:     2.20 cm      LV E/e' lateral: 23.2 LV SV:         71 LV SV Index:   37 LVOT Area:      3.80 cm  LV Volumes (MOD) LV vol d, MOD A2C: 78.9 ml LV vol d, MOD A4C: 142.0 ml LV vol s, MOD A2C: 46.2 ml LV vol s, MOD A4C: 84.8 ml LV SV MOD A2C:     32.7 ml LV SV MOD A4C:     142.0 ml LV SV MOD BP:      47.1 ml RIGHT VENTRICLE RV S prime:     9.14 cm/s TAPSE (M-mode): 2.7 cm LEFT ATRIUM             Index       RIGHT ATRIUM           Index LA diam:        4.40 cm 2.26 cm/m  RA Area:     11.10 cm LA Vol (A2C):   65.6 ml 33.76 ml/m RA Volume:   25.20 ml  12.97 ml/m LA Vol (A4C):   70.2 ml 36.13 ml/m LA Biplane Vol: 74.1 ml 38.14 ml/m  AORTIC VALVE LVOT Vmax:   74.60 cm/s LVOT Vmean:  55.100 cm/s LVOT VTI:    0.187 m  AORTA Ao Root diam: 3.10 cm MITRAL VALVE MV Area (PHT):  3.72 cm     SHUNTS MV Decel Time: 204 msec     Systemic VTI:  0.19 m MV E velocity: 126.00 cm/s  Systemic Diam: 2.20 cm MV A velocity: 79.60 cm/s MV E/A ratio:  1.58 Dietrich Pates MD Electronically signed by Dietrich Pates MD Signature Date/Time: 03/28/2020/11:51:17 AM    Final     Review of Systems  Constitutional: Negative for chills, diaphoresis and fever.  HENT: Negative for ear discharge, ear pain, hearing loss and tinnitus.   Eyes: Negative for photophobia and pain.  Respiratory: Negative for cough and shortness of breath.   Cardiovascular: Positive for leg swelling. Negative for chest pain.  Gastrointestinal: Negative for abdominal pain, nausea and vomiting.  Genitourinary: Negative for dysuria, flank pain, frequency and urgency.  Musculoskeletal: Negative for back pain, myalgias and neck pain.  Neurological: Negative for dizziness and headaches.  Hematological: Does not bruise/bleed easily.  Psychiatric/Behavioral: The patient is not nervous/anxious.    Blood pressure 98/70, pulse 77, temperature 98.1 F (36.7 C), resp. rate 19, height 5\' 11"  (1.803 m), weight 74.8 kg, SpO2 96 %. Physical Exam Constitutional:      General: He is not in acute distress.    Appearance: He is well-developed and well-nourished. He is  not diaphoretic.  HENT:     Head: Normocephalic and atraumatic.  Eyes:     General: No scleral icterus.       Right eye: No discharge.        Left eye: No discharge.     Conjunctiva/sclera: Conjunctivae normal.  Cardiovascular:     Rate and Rhythm: Normal rate and regular rhythm.  Pulmonary:     Effort: Pulmonary effort is normal. No respiratory distress.  Musculoskeletal:     Cervical back: Normal range of motion.  Feet:     Comments: Right foot: 2nd and 3rd toes discolored with fusiform edema, no significant ulcerations except at tips of toes, EHL/flex/ext 5/5, SPN/DPN/TN intact, 2+ DP, 0 PT  Left foot: Ulceration plantar 1st MTP joint, EHL/flex/ext 5/5, SPN/DPN/TN intact, 2+ DP, 0 PT Skin:    General: Skin is warm and dry.  Neurological:     Mental Status: He is alert.  Psychiatric:        Mood and Affect: Mood and affect normal.        Behavior: Behavior normal.     Assessment/Plan: Right foot osteo -- Will have Dr. evaluate. Broached amputation with patient.    Lajoyce Corners, PA-C Orthopedic Surgery [phone] 03/28/2020, 3:53 PM

## 2020-03-28 NOTE — Progress Notes (Signed)
Curtis Watkins is a 65 y.o. male with a hx of HTN and DM who is being seen today for the evaluation of CHF & elevated troponin at the request of Dr. Catha Gosselin. No prior cardiac hx.  Presented with few days hx of bilateral LE swelling, shortness of breath, orthopnea and non productive cough. He was hypotensive at home and held HCTZ. Has gained weight. Went to urgent care and sent to ER. Has bilateral LE wound. He has received his COVID vaccine and boosted.   Physical exam showed:   Lab showed: Crea 1.29, Troponin  353-> 337. Hb 10.8, Ptl 286. BNP 1383. CXR with small bilateral pleural effusions with bibasilar atelectasis or infiltrate.  ECG shows: SR, LVH, anterior Q wave, negative T waves in the lateral leads.   Echocardiogram showed: LVEF: 40 to 45%. There is hypokinesis in the apical inferior, septal and anterior walls. Mild LVH. Grade 2 DD, Normal RV size and diastolic function. LA mildly dilated, mild MR.

## 2020-03-28 NOTE — Progress Notes (Signed)
PROGRESS NOTE    Curtis Watkins  GHW:[id number] DOB: [birth year] DOA: 03/27/2020 PCP: Doran Stabler, NP   Brief Narrative:  HPI On 03/27/20 by Dr. Lyda Perone Curtis Watkins is a 65 y.o. male with medical history significant of DM2, HTN.  Pt sent to ED from [facility] for new onset of BLE edema and SOB.  Symptoms onset over the past few days.  He had recently stopped taking HCTZ after noting his BP was low at home.  Since that time he has had progressive BLE edema, DOE, orthopnea.  Pt is COVID vaccinated and boosted.  Has had wounds on BLE feet over course of past couple of weeks.  No CP, no abd pain.  Interim history Patient presented with volume overload, found to have a BNP of 1383.  Echocardiogram showing CHF.  Cardiology consulted and appreciated, pending catheterization. Also noted to have osteo of the right toes, ortho consulted. Assessment & Plan   New onset combined systolic and diastolic CHF -Patient presented with lower extremity swelling and orthopnea along with fatigue -BNP on admission 1383 -Echocardiogram showed an EF of 40 to 45%, grade 2 diastolic dysfunction.  Mild LVH. -Will started on IV Lasix -Cardiology consulted and appreciated  NSTEMI -High-sensitivity troponin 353, 337 -Cardiology consulted and -Patient started on heparin -Pending lipid panel -Planning for left heart catheterization on 03/29/2020  Osteomyelitis/diabetic foot ulcer -Right foot x-ray suggestive of osteomyelitis of the second and third distal phalanges -Patient does have foot ulcers on both feet bilaterally -Orthopedics consulted and appreciated  Acute kidney injury vs chronic kidney disease, stage II -No previous labs for comparison -Creatinine currently 1.29, continue to monitor BMP  Essential hypertension -BP currently soft -Currently placed on IV Lasix and lisinopril   Diabetes mellitus, type II -Metformin held -Continue insulin sliding scale CBG monitor -Hemoglobin A1c  pending  Anemia -Hemoglobin currently 10.8, no prior labs for comparison -Trend CBC  DVT Prophylaxis  Heparin  Code Status: Full  Family Communication: None at bedside  Disposition Plan:  Status is: Inpatient  Remains inpatient appropriate because:Inpatient level of care appropriate due to severity of illness   Dispo: The patient is from: Home              Anticipated d/c is to: Home              Anticipated d/c date is: 3 days              Patient currently is not medically stable to d/c.   Difficult to place patient No   Consultants Cardiology Orthopedics  Procedures  Echocardiogram  Antibiotics   Anti-infectives (From admission, onward)   None      Subjective:   Curtis Watkins seen and examined today.  Patient states he is feeling better since coming to the hospital.  He states he is able to lay flat somewhat.  States he has been having lower extremity swelling as well as fatigue and dyspnea with exertion and inability to lay flat for quite some time.  Currently denies chest pain, abdominal pain, nausea or vomiting, dizziness or headache. Objective:   Vitals:   03/28/20 0441 03/28/20 0933 03/28/20 1054 03/28/20 1416  BP: 123/80 (!) 94/55 100/68 98/70  Pulse: 70 72 77 77  Resp: (!) 21 (!) 22 (!) 22 19  Temp:      TempSrc:      SpO2: 98% 98% 94% 96%  Weight:      Height:  No intake or output data in the 24 hours ending 03/28/20 1541 Filed Weights   03/27/20 1654  Weight: 74.8 kg    Exam  General: Well developed, chronically ill-appearing, NAD  HEENT: NCAT,  mucous membranes moist.   Cardiovascular: S1 S2 auscultated, RRR, no murmur  Respiratory: Diminished breath sounds, faint rales  Abdomen: Soft, nontender, nondistended, + bowel sounds  Extremities: warm dry without cyanosis clubbing, LE edema with bilateral foot ulcers  Neuro: AAOx3, nonfocal  Skin: Without rashes exudates or nodules  Psych: Normal affect and demeanor with intact  judgement and insight   Data Reviewed: I have personally reviewed following labs and imaging studies  CBC: Recent Labs  Lab 03/27/20 1701  WBC 6.7  HGB 10.8*  HCT 34.0*  MCV 100.0  PLT 286   Basic Metabolic Panel: Recent Labs  Lab 03/27/20 1701 03/27/20 2337 03/28/20 0429  NA 138  --  137  K 4.3  --  3.9  CL 105  --  105  CO2 23  --  24  GLUCOSE 122*  --  115*  BUN 19  --  18  CREATININE 1.30* 1.36* 1.29*  CALCIUM 8.8*  --  8.7*   GFR: Estimated Creatinine Clearance: 61.2 mL/min (A) (by C-G formula based on SCr of 1.29 mg/dL (H)). Liver Function Tests: No results for input(s): AST, ALT, ALKPHOS, BILITOT, PROT, ALBUMIN in the last 168 hours. No results for input(s): LIPASE, AMYLASE in the last 168 hours. No results for input(s): AMMONIA in the last 168 hours. Coagulation Profile: No results for input(s): INR, PROTIME in the last 168 hours. Cardiac Enzymes: No results for input(s): CKTOTAL, CKMB, CKMBINDEX, TROPONINI in the last 168 hours. BNP (last 3 results) No results for input(s): PROBNP in the last 8760 hours. HbA1C: No results for input(s): HGBA1C in the last 72 hours. CBG: Recent Labs  Lab 03/28/20 0915 03/28/20 1213  GLUCAP 171* 81   Lipid Profile: No results for input(s): CHOL, HDL, LDLCALC, TRIG, CHOLHDL, LDLDIRECT in the last 72 hours. Thyroid Function Tests: No results for input(s): TSH, T4TOTAL, FREET4, T3FREE, THYROIDAB in the last 72 hours. Anemia Panel: No results for input(s): VITAMINB12, FOLATE, FERRITIN, TIBC, IRON, RETICCTPCT in the last 72 hours. Urine analysis: No results found for: COLORURINE, APPEARANCEUR, LABSPEC, PHURINE, GLUCOSEU, HGBUR, BILIRUBINUR, KETONESUR, PROTEINUR, UROBILINOGEN, NITRITE, LEUKOCYTESUR Sepsis Labs: @LABRCNTIP (procalcitonin:4,lacticidven:4)  ) Recent Results (from the past 240 hour(s))  SARS CORONAVIRUS 2 (TAT 6-24 HRS) Nasopharyngeal Nasopharyngeal Swab     Status: None   Collection Time: 03/27/20 10:33 PM    Specimen: Nasopharyngeal Swab  Result Value Ref Range Status   SARS Coronavirus 2 NEGATIVE NEGATIVE Final    Comment: (NOTE) SARS-CoV-2 target nucleic acids are NOT DETECTED.  The SARS-CoV-2 RNA is generally detectable in upper and lower respiratory specimens during the acute phase of infection. Negative results do not preclude SARS-CoV-2 infection, do not rule out co-infections with other pathogens, and should not be used as the sole basis for treatment or other patient management decisions. Negative results must be combined with clinical observations, patient history, and epidemiological information. The expected result is Negative.  Fact Sheet for Patients: HairSlick.no  Fact Sheet for Healthcare Providers: quierodirigir.com  This test is not yet approved or cleared by the [place] FDA and  has been authorized for detection and/or diagnosis of SARS-CoV-2 by FDA under an Emergency Use Authorization (EUA). This EUA will remain  in effect (meaning this test can be used) for the duration of the COVID-19  declaration under Se ction 564(b)(1) of the Act, 21 U.S.C. section 360bbb-3(b)(1), unless the authorization is terminated or revoked sooner.  Performed at [facility], [address] [place], [place] [address]       Radiology Studies: DG Chest 2 View  Result Date: 03/27/2020 CLINICAL DATA:  65 year old male with shortness of breath. EXAM: CHEST - 2 VIEW COMPARISON:  Chest radiograph dated 03/15/2016 FINDINGS: The small bilateral pleural effusions with bibasilar atelectasis or infiltrate. No pneumothorax. The cardiac silhouette is within limits. No acute osseous pathology. Atherosclerotic calcification of the aorta. IMPRESSION: Small bilateral pleural effusions with bibasilar atelectasis or infiltrate. Electronically Signed   By: Elgie CollardArash  Radparvar M.D.   On: 03/27/2020 17:23   DG Foot Complete Left  Result Date:  03/27/2020 CLINICAL DATA:  Wounds on the second and third digits EXAM: LEFT FOOT - COMPLETE 3+ VIEW COMPARISON:  None. FINDINGS: There is no evidence of fracture or dislocation. There is no evidence of arthropathy or other focal bone abnormality. Mild soft tissue swelling seen around the third through fifth distal phalanges. IMPRESSION: Negative. Electronically Signed   By: Jonna ClarkBindu  Avutu M.D.   On: 03/27/2020 23:04   DG Foot Complete Right  Result Date: 03/27/2020 CLINICAL DATA:  Wounds second third digit EXAM: RIGHT FOOT COMPLETE - 3+ VIEW COMPARISON:  None. FINDINGS: There is cortical destruction and erosive type change seen at the distal phalanx the second third digits. There is overlying tissue swelling and nailbed irregularity noted. No displaced fracture is identified. Dorsal soft tissue swelling is seen. 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 Electronically Signed   By: Jonna ClarkBindu  Avutu M.D.   On: 03/27/2020 23:03   ECHOCARDIOGRAM COMPLETE  Result Date: 03/28/2020    ECHOCARDIOGRAM REPORT   Patient Name:   Curtis Watkins Date of Exam: 03/28/2020 Medical Rec #:  [id number]   Height:       71.0 in Accession #:    [id number]  Weight:       165.0 lb Date of Birth:  [birth year]  BSA:          1.943 m Patient Age:    64 years    BP:           123/80 mmHg Patient Gender: M           HR:           75 bpm. Exam Location:  Inpatient Procedure: 2D Echo, Color Doppler and Cardiac Doppler Indications:    I50.21 Acute systolic (congestive) heart failure  History:        Patient has no prior history of Echocardiogram examinations.                 Risk Factors:Hypertension and Diabetes.  Sonographer:    Irving BurtonEmily Senior RDCS Referring Phys: [id number] JARED M GARDNER IMPRESSIONS  1. Left ventricular ejection fraction, by estimation, is 40 to 45%. The left ventricle has mildly decreased function. There is mild left ventricular hypertrophy. Left ventricular diastolic parameters are  consistent with Grade II diastolic dysfunction (pseudonormalization). Elevated left atrial pressure.  2. Right ventricular systolic function is normal. The right ventricular size is normal.  3. Left atrial size was mildly dilated.  4. Mild mitral valve regurgitation.  5. The inferior vena cava is dilated in size with <50% respiratory variability, suggesting right atrial pressure of 15 mmHg. FINDINGS  Left Ventricle: LVEF is depressed with hypokineis of the distal septal, distal inferior, distal lateral walls;  akinesis at very apex. Left ventricular ejection fraction, by estimation, is 40 to 45%. The left ventricle has mildly decreased function. The left ventricle demonstrates regional wall motion abnormalities. The left ventricular internal cavity size was normal in size. There is mild left ventricular hypertrophy. Left ventricular diastolic parameters are consistent with Grade II diastolic dysfunction (pseudonormalization). Elevated left atrial pressure. Right Ventricle: The right ventricular size is normal. Right vetricular wall thickness was not assessed. Right ventricular systolic function is normal. Left Atrium: Left atrial size was mildly dilated. Right Atrium: Right atrial size was normal in size. Pericardium: There is no evidence of pericardial effusion. Mitral Valve: The mitral valve is normal in structure. Mild mitral valve regurgitation. Tricuspid Valve: The tricuspid valve is normal in structure. Tricuspid valve regurgitation is trivial. Aortic Valve: The aortic valve is normal in structure. Aortic valve regurgitation is not visualized. Pulmonic Valve: The pulmonic valve was grossly normal. Pulmonic valve regurgitation is not visualized. Aorta: The aortic root is normal in size and structure. Venous: The inferior vena cava is dilated in size with less than 50% respiratory variability, suggesting right atrial pressure of 15 mmHg. IAS/Shunts: No atrial level shunt detected by color flow Doppler.  LEFT  VENTRICLE PLAX 2D LVIDd:         4.60 cm      Diastology LVIDs:         3.50 cm      LV e' medial:    3.92 cm/s LV PW:         1.20 cm      LV E/e' medial:  32.1 LV IVS:        1.10 cm      LV e' lateral:   5.44 cm/s LVOT diam:     2.20 cm      LV E/e' lateral: 23.2 LV SV:         71 LV SV Index:   37 LVOT Area:     3.80 cm  LV Volumes (MOD) LV vol d, MOD A2C: 78.9 ml LV vol d, MOD A4C: 142.0 ml LV vol s, MOD A2C: 46.2 ml LV vol s, MOD A4C: 84.8 ml LV SV MOD A2C:     32.7 ml LV SV MOD A4C:     142.0 ml LV SV MOD BP:      47.1 ml RIGHT VENTRICLE RV S prime:     9.14 cm/s TAPSE (M-mode): 2.7 cm LEFT ATRIUM             Index       RIGHT ATRIUM           Index LA diam:        4.40 cm 2.26 cm/m  RA Area:     11.10 cm LA Vol (A2C):   65.6 ml 33.76 ml/m RA Volume:   25.20 ml  12.97 ml/m LA Vol (A4C):   70.2 ml 36.13 ml/m LA Biplane Vol: 74.1 ml 38.14 ml/m  AORTIC VALVE LVOT Vmax:   74.60 cm/s LVOT Vmean:  55.100 cm/s LVOT VTI:    0.187 m  AORTA Ao Root diam: 3.10 cm MITRAL VALVE MV Area (PHT): 3.72 cm     SHUNTS MV Decel Time: 204 msec     Systemic VTI:  0.19 m MV E velocity: 126.00 cm/s  Systemic Diam: 2.20 cm MV A velocity: 79.60 cm/s MV E/A ratio:  1.58 Dietrich Pates MD Electronically signed by Dietrich Pates MD Signature Date/Time: 03/28/2020/11:51:17 AM    Final  Scheduled Meds: . [START ON 03/29/2020] aspirin EC  81 mg Oral Daily  . atorvastatin  80 mg Oral Daily  . furosemide  40 mg Intravenous BID  . hydrocerin   Topical Daily  . insulin aspart  0-15 Units Subcutaneous TID WC  . lisinopril  5 mg Oral Daily  . sodium chloride flush  3 mL Intravenous Q12H  . sodium chloride flush  3 mL Intravenous Q12H   Continuous Infusions: . sodium chloride    . heparin       LOS: 1 day   Time Spent in minutes   45 minutes  Shikira Folino D.O. on 03/28/2020 at 3:41 PM  Between 7am to 7pm - Please see pager noted on [email or web address]  After 7pm go to [email or web address]  And look for the night coverage person  covering for me after hours  [facility] Office  [phone]

## 2020-03-28 NOTE — Consult Note (Addendum)
Cardiology Consultation:   Patient ID: Curtis Watkins MRN: [id number]; DOB: [birth year]  Admit date: 03/27/2020 Date of Consult: 03/28/2020  Primary Care Provider: Doran Stabler, NP [facility] Cardiologist: New to [facility] (Dr. Delton See)   Patient Profile:   Curtis Watkins is a 65 y.o. male with a hx of HTN and DM who is being seen today for the evaluation of CHF & elevated troponin at the request of Dr. Catha Gosselin.   No prior cardiac hx.   History of Present Illness:   Curtis Watkins presented with 2 days hx of bilateral LE swelling, shortness of breath, orthopnea and non productive cough. He was hypotensive at home 4 days ago and held HCTZ. Has gained weight. Went to urgent care and sent to ER. Has bilateral LE wound. He has received his COVID vaccine and boosted. Had fever and chills 2 weeks ago. Did not tested for COVID at that time. He denies chest pressure or tightness. Reported SOB with exertion for past few days. No palpitations, dizziness, syncope or melena. Smokes daily tobacco for past "2 year". Occasional marijuana smoking and alcohol drinking. Denies high salt diet. No prior cardiac hx. No family hx of CAD.   BNP 1383 CXR with small bilateral pleural effusions with bibasilar atelectasis or infiltrate. Hs-troponin 353>>337 Scr 1.9>>1.36>>1.29 Right foot Xray: Findings suggestive of osteomyelitis involving the second and third distal phalanges.  He has received IV lasix x 2 with improved symptoms.   Past Medical History:  Diagnosis Date  . Diabetes mellitus without complication (HCC)   . Hypertension     Inpatient Medications: Scheduled Meds: . aspirin EC  81 mg Oral Daily  . enoxaparin (LOVENOX) injection  40 mg Subcutaneous Q24H  . furosemide  40 mg Intravenous Daily  . hydrocerin   Topical Daily  . insulin aspart  0-15 Units Subcutaneous TID WC  . lisinopril  5 mg Oral Daily  . sodium chloride flush  3 mL Intravenous Q12H   Continuous Infusions: . sodium chloride      PRN Meds: sodium chloride, acetaminophen, ondansetron (ZOFRAN) IV, sodium chloride flush  Allergies:   No Known Allergies  Social History:   Social History   Socioeconomic History  . Marital status: Significant Other    Spouse name: Not on file  . Number of children: Not on file  . Years of education: Not on file  . Highest education level: Not on file  Occupational History  . Not on file  Tobacco Use  . Smoking status: Current Every Day Smoker    Packs/day: 1.00    Types: Cigarettes  . Smokeless tobacco: Never Used  Substance and Sexual Activity  . Alcohol use: Yes  . Drug use: Yes    Frequency: 3.0 times per week    Types: Marijuana  . Sexual activity: Not Currently  Other Topics Concern  . Not on file  Social History Narrative  . Not on file   Social Determinants of Health   Financial Resource Strain: Not on file  Food Insecurity: Not on file  Transportation Needs: Not on file  Physical Activity: Not on file  Stress: Not on file  Social Connections: Not on file  Intimate Partner Violence: Not on file    Family History:   Denies family hx of CAD  ROS:  Please see the history of present illness.  All other ROS reviewed and negative.     Physical Exam/Data:   Vitals:   03/28/20 0441 03/28/20 0933 03/28/20 1054 03/28/20  1416  BP: 123/80 (!) 94/55 100/68 98/70  Pulse: 70 72 77 77  Resp: (!) 21 (!) 22 (!) 22 19  Temp:      TempSrc:      SpO2: 98% 98% 94% 96%  Weight:      Height:       No intake or output data in the 24 hours ending 03/28/20 1503 Last 3 Weights 03/27/2020  Weight (lbs) 165 lb  Weight (kg) 74.844 kg     Body mass index is 23.01 kg/m.  General:  Well nourished, well developed, in no acute distress HEENT: normal Lymph: no adenopathy Neck: + JVD Endocrine:  No thryomegaly Vascular: No carotid bruits; FA pulses 2+ bilaterally without bruits  Cardiac:  normal S1, S2; RRR; no murmur Lungs: faint rales bibasilar  Abd: soft,  nontender, no hepatomegaly  Ext: Trace to 1+ edema bilaterally foot digits with edema and discoloration  Musculoskeletal:  , BUE and BLE strength normal and equal Skin: warm and dry  Neuro:  CNs 2-12 intact, no focal abnormalities noted Psych:  Normal affect   EKG:  The EKG was personally reviewed and demonstrates:  Sinus rhythm with ST changes in lateral leads Telemetry:  Telemetry was personally reviewed and demonstrates:  Not on tele. In hallway currently   Relevant CV Studies:  Echo 03/28/2020 1. Left ventricular ejection fraction, by estimation, is 40 to 45%. The  left ventricle has mildly decreased function. There is mild left  ventricular hypertrophy. Left ventricular diastolic parameters are  consistent with Grade II diastolic dysfunction  (pseudonormalization). Elevated left atrial pressure.  2. Right ventricular systolic function is normal. The right ventricular  size is normal.  3. Left atrial size was mildly dilated.  4. Mild mitral valve regurgitation.  5. The inferior vena cava is dilated in size with <50% respiratory  variability, suggesting right atrial pressure of 15 mmHg.   FINDINGS  Left Ventricle: LVEF is depressed with hypokineis of the distal septal,  distal inferior, distal lateral walls; akinesis at very apex. Left  ventricular ejection fraction, by estimation, is 40 to 45%. The left  ventricle has mildly decreased function. The  left ventricle demonstrates regional wall motion abnormalities. The left  ventricular internal cavity size was normal in size. There is mild left  ventricular hypertrophy. Left ventricular diastolic parameters are  consistent with Grade II diastolic  dysfunction (pseudonormalization). Elevated left atrial pressure.   Right Ventricle: The right ventricular size is normal. Right vetricular  wall thickness was not assessed. Right ventricular systolic function is  normal.   Left Atrium: Left atrial size was mildly dilated.    Right Atrium: Right atrial size was normal in size.   Pericardium: There is no evidence of pericardial effusion.   Mitral Valve: The mitral valve is normal in structure. Mild mitral valve  regurgitation.   Tricuspid Valve: The tricuspid valve is normal in structure. Tricuspid  valve regurgitation is trivial.   Aortic Valve: The aortic valve is normal in structure. Aortic valve  regurgitation is not visualized.   Pulmonic Valve: The pulmonic valve was grossly normal. Pulmonic valve  regurgitation is not visualized.   Aorta: The aortic root is normal in size and structure.   Venous: The inferior vena cava is dilated in size with less than 50%  respiratory variability, suggesting right atrial pressure of 15 mmHg.   IAS/Shunts: No atrial level shunt detected by color flow Doppler.     LEFT VENTRICLE  PLAX 2D  LVIDd:  4.60 cm   Diastology  LVIDs:     3.50 cm   LV e' medial:  3.92 cm/s  LV PW:     1.20 cm   LV E/e' medial: 32.1  LV IVS:    1.10 cm   LV e' lateral:  5.44 cm/s  LVOT diam:   2.20 cm   LV E/e' lateral: 23.2  LV SV:     71  LV SV Index:  37  LVOT Area:   3.80 cm    LV Volumes (MOD)  LV vol d, MOD A2C: 78.9 ml  LV vol d, MOD A4C: 142.0 ml  LV vol s, MOD A2C: 46.2 ml  LV vol s, MOD A4C: 84.8 ml  LV SV MOD A2C:   32.7 ml  LV SV MOD A4C:   142.0 ml  LV SV MOD BP:   47.1 ml   RIGHT VENTRICLE  RV S prime:   9.14 cm/s  TAPSE (M-mode): 2.7 cm   LEFT ATRIUM       Index    RIGHT ATRIUM      Index  LA diam:    4.40 cm 2.26 cm/m RA Area:   11.10 cm  LA Vol (A2C):  65.6 ml 33.76 ml/m RA Volume:  25.20 ml 12.97 ml/m  LA Vol (A4C):  70.2 ml 36.13 ml/m  LA Biplane Vol: 74.1 ml 38.14 ml/m  AORTIC VALVE  LVOT Vmax:  74.60 cm/s  LVOT Vmean: 55.100 cm/s  LVOT VTI:  0.187 m    AORTA  Ao Root diam: 3.10 cm   MITRAL VALVE  MV Area (PHT): 3.72 cm   SHUNTS  MV Decel Time:  204 msec   Systemic VTI: 0.19 m  MV E velocity: 126.00 cm/s Systemic Diam: 2.20 cm  MV A velocity: 79.60 cm/s  MV E/A ratio: 1.58   Dietrich Pates MD  Electronically signed by Dietrich Pates MD  Signature Date/Time: 03/28/2020/11:51:17 AM   Laboratory Data:  High Sensitivity Troponin:   Recent Labs  Lab 03/27/20 2337 03/28/20 0146  TROPONINIHS 353* 337*     Chemistry Recent Labs  Lab 03/27/20 1701 03/27/20 2337 03/28/20 0429  NA 138  --  137  K 4.3  --  3.9  CL 105  --  105  CO2 23  --  24  GLUCOSE 122*  --  115*  BUN 19  --  18  CREATININE 1.30* 1.36* 1.29*  CALCIUM 8.8*  --  8.7*  GFRNONAA >60 58* >60  ANIONGAP 10  --  8    Hematology Recent Labs  Lab 03/27/20 1701  WBC 6.7  RBC 3.40*  HGB 10.8*  HCT 34.0*  MCV 100.0  MCH 31.8  MCHC 31.8  RDW 12.6  PLT 286   BNP Recent Labs  Lab 03/27/20 1811  BNP 1,383.1*    Radiology/Studies:  DG Chest 2 View  Result Date: 03/27/2020 CLINICAL DATA:  65 year old male with shortness of breath. EXAM: CHEST - 2 VIEW COMPARISON:  Chest radiograph dated 03/15/2016 FINDINGS: The small bilateral pleural effusions with bibasilar atelectasis or infiltrate. No pneumothorax. The cardiac silhouette is within limits. No acute osseous pathology. Atherosclerotic calcification of the aorta. IMPRESSION: Small bilateral pleural effusions with bibasilar atelectasis or infiltrate. Electronically Signed   By: Elgie Collard M.D.   On: 03/27/2020 17:23   DG Foot Complete Left  Result Date: 03/27/2020 CLINICAL DATA:  Wounds on the second and third digits EXAM: LEFT FOOT - COMPLETE 3+ VIEW COMPARISON:  None.  FINDINGS: There is no evidence of fracture or dislocation. There is no evidence of arthropathy or other focal bone abnormality. Mild soft tissue swelling seen around the third through fifth distal phalanges. IMPRESSION: Negative. Electronically Signed   By: Jonna Clark M.D.   On: 03/27/2020 23:04   DG Foot Complete Right  Result Date:  03/27/2020 CLINICAL DATA:  Wounds second third digit EXAM: RIGHT FOOT COMPLETE - 3+ VIEW COMPARISON:  None. FINDINGS: There is cortical destruction and erosive type change seen at the distal phalanx the second third digits. There is overlying tissue swelling and nailbed irregularity noted. No displaced fracture is identified. Dorsal soft tissue swelling is seen. 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 Electronically Signed   By: Jonna Clark M.D.   On: 03/27/2020 23:03   ECHOCARDIOGRAM COMPLETE  Result Date: 03/28/2020    ECHOCARDIOGRAM REPORT   Patient Name:   Curtis Watkins Date of Exam: 03/28/2020 Medical Rec #:  [id number]   Height:       71.0 in Accession #:    [id number]  Weight:       165.0 lb Date of Birth:  [birth year]  BSA:          1.943 m Patient Age:    64 years    BP:           123/80 mmHg Patient Gender: M           HR:           75 bpm. Exam Location:  Inpatient Procedure: 2D Echo, Color Doppler and Cardiac Doppler Indications:    I50.21 Acute systolic (congestive) heart failure  History:        Patient has no prior history of Echocardiogram examinations.                 Risk Factors:Hypertension and Diabetes.  Sonographer:    Irving Burton Senior RDCS Referring Phys: [phone] JARED M GARDNER IMPRESSIONS  1. Left ventricular ejection fraction, by estimation, is 40 to 45%. The left ventricle has mildly decreased function. There is mild left ventricular hypertrophy. Left ventricular diastolic parameters are consistent with Grade II diastolic dysfunction (pseudonormalization). Elevated left atrial pressure.  2. Right ventricular systolic function is normal. The right ventricular size is normal.  3. Left atrial size was mildly dilated.  4. Mild mitral valve regurgitation.  5. The inferior vena cava is dilated in size with <50% respiratory variability, suggesting right atrial pressure of 15 mmHg. FINDINGS  Left Ventricle: LVEF is depressed with hypokineis of the  distal septal, distal inferior, distal lateral walls; akinesis at very apex. Left ventricular ejection fraction, by estimation, is 40 to 45%. The left ventricle has mildly decreased function. The left ventricle demonstrates regional wall motion abnormalities. The left ventricular internal cavity size was normal in size. There is mild left ventricular hypertrophy. Left ventricular diastolic parameters are consistent with Grade II diastolic dysfunction (pseudonormalization). Elevated left atrial pressure. Right Ventricle: The right ventricular size is normal. Right vetricular wall thickness was not assessed. Right ventricular systolic function is normal. Left Atrium: Left atrial size was mildly dilated. Right Atrium: Right atrial size was normal in size. Pericardium: There is no evidence of pericardial effusion. Mitral Valve: The mitral valve is normal in structure. Mild mitral valve regurgitation. Tricuspid Valve: The tricuspid valve is normal in structure. Tricuspid valve regurgitation is trivial. Aortic Valve: The aortic valve is normal in structure. Aortic valve regurgitation is not visualized. Pulmonic Valve: The  pulmonic valve was grossly normal. Pulmonic valve regurgitation is not visualized. Aorta: The aortic root is normal in size and structure. Venous: The inferior vena cava is dilated in size with less than 50% respiratory variability, suggesting right atrial pressure of 15 mmHg. IAS/Shunts: No atrial level shunt detected by color flow Doppler.  LEFT VENTRICLE PLAX 2D LVIDd:         4.60 cm      Diastology LVIDs:         3.50 cm      LV e' medial:    3.92 cm/s LV PW:         1.20 cm      LV E/e' medial:  32.1 LV IVS:        1.10 cm      LV e' lateral:   5.44 cm/s LVOT diam:     2.20 cm      LV E/e' lateral: 23.2 LV SV:         71 LV SV Index:   37 LVOT Area:     3.80 cm  LV Volumes (MOD) LV vol d, MOD A2C: 78.9 ml LV vol d, MOD A4C: 142.0 ml LV vol s, MOD A2C: 46.2 ml LV vol s, MOD A4C: 84.8 ml LV SV MOD  A2C:     32.7 ml LV SV MOD A4C:     142.0 ml LV SV MOD BP:      47.1 ml RIGHT VENTRICLE RV S prime:     9.14 cm/s TAPSE (M-mode): 2.7 cm LEFT ATRIUM             Index       RIGHT ATRIUM           Index LA diam:        4.40 cm 2.26 cm/m  RA Area:     11.10 cm LA Vol (A2C):   65.6 ml 33.76 ml/m RA Volume:   25.20 ml  12.97 ml/m LA Vol (A4C):   70.2 ml 36.13 ml/m LA Biplane Vol: 74.1 ml 38.14 ml/m  AORTIC VALVE LVOT Vmax:   74.60 cm/s LVOT Vmean:  55.100 cm/s LVOT VTI:    0.187 m  AORTA Ao Root diam: 3.10 cm MITRAL VALVE MV Area (PHT): 3.72 cm     SHUNTS MV Decel Time: 204 msec     Systemic VTI:  0.19 m MV E velocity: 126.00 cm/s  Systemic Diam: 2.20 cm MV A velocity: 79.60 cm/s MV E/A ratio:  1.58 Dietrich Pates MD Electronically signed by Dietrich Pates MD Signature Date/Time: 03/28/2020/11:51:17 AM    Final      Assessment and Plan:   1. Elevated troponin / NSTEMI 2. Acute combined CHF 3. Abnormal EKG  Presented with 2 days hx of dyspnea, LE edema, orthopnea and weight gain while holding HCTZ due to hypotension. BNP elevated. Chest xray with infiltrate vs atelectasis. Hs-troponin 353>>337. EKG with non specific lateral ST changes. Echo with reduce LVEF at 40-45%, grade 2 DD, regional wall motion abnormality. Fortunately he did not have any chest pain but recently noted DOE and was sick with fever and chills 2 weeks ago. Cardiac risk factors includes HTN, DM and tobacco smoking. He has eaten today.   - Will plan LHC tomorrow.  - Add ASA 81mg  qd and Lipitor 80mg  qd - Check Lipid panel - Continue Lisinopril 5mg  and IV lasix 40mg  daily - Add BB post cath (BP soft currently)  - Start Heparin (No bolus)  4. Anemia -  HGb 10.8 - Work up per primary team - Denies bleeding  5. Osteomyelitis - Right foot Xray: Findings suggestive of osteomyelitis involving the second and third distal phalanges. - Normal WBC - Per primary team  6. HTN - BP soft - As above  7. DM - HgbA1c pending - Management per  primary team   8. AKI -Scr 1.3>>1.63>>1.29 - Follow closely   9. Tobacco and marijuana smoking - Cessation recomended  Risk Assessment/Risk Scores:   TIMI Risk Score for Unstable Angina or Non-ST Elevation MI:   The patient's TIMI risk score is 3, which indicates a 13% risk of all cause mortality, new or recurrent myocardial infarction or need for urgent revascularization in the next 14 days.  New York Heart Association (NYHA) Functional Class NYHA Class II  For questions or updates, please contact [facility] Please consult [email or web address] for contact info under   Lorelei PontSigned, Bhavinkumar Bhagat, [place]  03/28/2020 3:03 PM   The patient was seen, examined and discussed with Bhagat,Bhavinkumar PA-C and I agree with the above.   Curtis Watkins a 64 y.o.malewith a hx of HTN and DMwho is being seen today for the evaluation ofCHF & elevated troponinat the request ofDr. Catha GosselinMikhail. No prior cardiac hx. Presented with few days hx of bilateral LE swelling, shortness of breath, orthopnea and non productive cough. He was hypotensive at home and held HCTZ. Has gained weight. Went to urgent care and sent to ER. Has bilateral LE wound. He has received his COVID vaccine and boosted.   Physical exam showed: no JVDs, S1-S2 2 out of 6 systolic murmur, crackles at both lung bases, and bilateral 2+ pitting edema with chronic venous stasis discoloration of the skin, edema up to his knees.  Lab showed: Crea 1.29, Troponin  353-> 337. Hb 10.8, Ptl 286. BNP 1383. CXRwith small bilateral pleural effusions with bibasilar atelectasis or infiltrate.  ECG shows: SR, LVH, anterior Q wave, negative T waves in the lateral leads.   Echocardiogram showed: LVEF: 40 to 45%. There is hypokinesis in the apical inferior, septal and anterior walls. Mild LVH. Grade 2 DD, Normal RV size and diastolic function. LA mildly dilated, mild MR.   Assessment and plan:  Newly diagnosed acute combined systolic diastolic  CHF -We will follow creatinine closely, administer Lasix 40 mg IV twice daily  Elevated troponin, possibly's non-STEMI, will initiate heparin drip until cardiac cath despite downtrending troponins.  Will plan for cardiac catheterization tomorrow.  Hypertension Hyperlipidemia Smoking -smoking education will need to be provided.  Curtis AlexanderKatarina Albertia Carvin, MD 03/28/2020

## 2020-03-28 NOTE — Consult Note (Signed)
WOC Nurse Consult Note: Reason for Consult: Patient with chronic plantar foot calluses over metatarsal heads with early bony deformity of toes.  Was seen by Podiatry at [facility] on 10/19/19 (NP, Ms J. Foreman) for paring of the calluses, but no visit since then is noted. Wound type: Neuropathic Pressure Injury POA: N/A Measurement: To be measured by Bedside RN with first dressing change and documented on Nursing Flow Sheet. Right foot, second and third digits with edema, discoloration, peeling and flaking. Corresponding calluses on the plantar aspects of the foot at the metatarsal heads 1 and 2 (2>1) are in need of paring. Left foot, third digit with edema and mild discoloration, corresponding calluses on the plantar aspect of the foot at the metatarsal heads 1 and 3 (1>3) are in need of paring. Wound bed: Dry, deep red from trauma typically associated with ambulation without protective foot wear or off-loading orthotics. Drainage (amount, consistency, odor) none Periwound:dry, intact Dressing procedure/placement/frequency: See photos in the EMR from the ED provider last evening. The plantar surface calluses are dry and relatively flat.  I will provide Nursing with guidance for the topical care of these areas which will include daily cleansing with soap and water, moisturizing with Eucerin Cream and softening the calluses and wrapping the affected digits with a topical antimicrobial nonadherent (xeroform) dressing.  This will be secured with a few turns of Kerlix roll gauze/paper tape. Feet are to be placed into Prevalon Boots for pressure injury to the heel prevention and a sacral foam is to be placed for sacral pressure injury prevention.  As this is not the reason for the admission and they do not appear to be a source of infection, I will defer to the Medical team to see if they wish to consult with Podiatry while in house or consider referral as an outpatient.  It is noted that the patient states he did  not follow up with a PCP because he does not currently have medical insurance.   WOC nursing team will not follow, but will remain available to this patient, the nursing and medical teams.  Please re-consult if needed. Thanks, Ladona Mow, MSN, RN, GNP, [place]  Pager# [phone]

## 2020-03-28 NOTE — Progress Notes (Signed)
ANTICOAGULATION CONSULT NOTE   Pharmacy Consult for heparin Indication: chest pain/ACS   Labs: Recent Labs    03/27/20 1701 03/27/20 2337 03/28/20 0146 03/28/20 0429 03/28/20 1915 03/28/20 2124  HGB 10.8*  --   --   --  11.3*  --   HCT 34.0*  --   --   --  33.7*  --   PLT 286  --   --   --  254  --   HEPARINUNFRC  --   --   --   --   --  <0.10*  CREATININE 1.30* 1.36*  --  1.29*  --   --   TROPONINIHS  --  353* 337*  --  309*  --     Assessment: 65 yo M presents w/ concern for NSTEMI. Pharmacy asked to start IV heparin with no bolus. No AC noted PTA. CBC ok, Hgb 10, plts wnl. Last dose of enoxaparin given @2350  on 03/27/20 for DVT PPX. Initial heparin level <0.1 units/ml  Goal of Therapy:  Heparin level 0.3-0.7 units/ml Monitor platelets by anticoagulation protocol: Yes   Plan:  Increase heparin infusion to 1100 units/hr Monitor daily HL, CBC, s/sx bleeding  Thanks for allowing pharmacy to be a part of this patient's care.  05/25/20, PharmD Clinical Pharmacist

## 2020-03-28 NOTE — Progress Notes (Addendum)
First trop noted to be 353.  Pt resting comfortably (though sitting up in bed), still denying any CP.  Will give cards a message.  Spoke with Dr. Deforest Hoyles: 1) Trend trop 2) EKG doesn't show an acute STEMI, may have delayed presentation MI though, trop will tell us more. 3) dont start heparin gtt yet, start if trop elevates  Ill order ASA 324.

## 2020-03-28 NOTE — Progress Notes (Signed)
ANTICOAGULATION CONSULT NOTE - Initial Consult  Pharmacy Consult for heparin Indication: chest pain/ACS  No Known Allergies  Patient Measurements: Height: 5\' 11"  (180.3 cm) Weight: 74.8 kg (165 lb) IBW/kg (Calculated) : 75.3 Heparin Dosing Weight: 74.8 kg  Vital Signs: BP: 98/70 (02/03 1416) Pulse Rate: 77 (02/03 1416)  Labs: Recent Labs    03/27/20 1701 03/27/20 2337 03/28/20 0146 03/28/20 0429  HGB 10.8*  --   --   --   HCT 34.0*  --   --   --   PLT 286  --   --   --   CREATININE 1.30* 1.36*  --  1.29*  TROPONINIHS  --  353* 337*  --     Estimated Creatinine Clearance: 61.2 mL/min (A) (by C-G formula based on SCr of 1.29 mg/dL (H)).   Medical History: Past Medical History:  Diagnosis Date  . Diabetes mellitus without complication (HCC)   . Hypertension     Assessment: 65 yo M presents w/ concern for NSTEMI. Pharmacy asked to start IV heparin with no bolus. No AC noted PTA. CBC ok, Hgb 10, plts wnl. Last dose of enoxaparin given @2350  on 03/27/20 for DVT PPX.  Goal of Therapy:  Heparin level 0.3-0.7 units/ml Monitor platelets by anticoagulation protocol: Yes   Plan:  Start heparin infusion at 900 units/hr Check 6-hr HL Monitor daily HL, CBC, s/sx bleeding  , PharmD, BCPS PGY2 Cardiology Pharmacy Resident Phone: [phone] 03/28/2020  3:39 PM  Please check [email or web address] for unit-specific pharmacy phone numbers.

## 2020-03-28 NOTE — H&P (View-Only) (Signed)
Sayan L Bogan is a 64 y.o. male with a hx of HTN and DM who is being seen today for the evaluation of CHF & elevated troponin at the request of Dr. Mikhail. No prior cardiac hx.  Presented with few days hx of bilateral LE swelling, shortness of breath, orthopnea and non productive cough. He was hypotensive at home and held HCTZ. Has gained weight. Went to urgent care and sent to ER. Has bilateral LE wound. He has received his COVID vaccine and boosted.   Physical exam showed:   Lab showed: Crea 1.29, Troponin  353-> 337. Hb 10.8, Ptl 286. BNP 1383. CXR with small bilateral pleural effusions with bibasilar atelectasis or infiltrate.  ECG shows: SR, LVH, anterior Q wave, negative T waves in the lateral leads.   Echocardiogram showed: LVEF: 40 to 45%. There is hypokinesis in the apical inferior, septal and anterior walls. Mild LVH. Grade 2 DD, Normal RV size and diastolic function. LA mildly dilated, mild MR.    

## 2020-03-28 NOTE — ED Notes (Signed)
Breakfast ordered 

## 2020-03-28 NOTE — Progress Notes (Signed)
Echocardiogram 2D Echocardiogram has been performed.  Curtis Watkins 03/28/2020, 8:26 AM

## 2020-03-29 ENCOUNTER — Other Ambulatory Visit (HOSPITAL_COMMUNITY): Payer: Self-pay

## 2020-03-29 ENCOUNTER — Inpatient Hospital Stay (HOSPITAL_COMMUNITY): Payer: Self-pay

## 2020-03-29 ENCOUNTER — Encounter (HOSPITAL_COMMUNITY): Payer: Self-pay | Admitting: Interventional Cardiology

## 2020-03-29 ENCOUNTER — Encounter (HOSPITAL_COMMUNITY)
Admission: EM | Disposition: A | Discharge status: Home / Self Care | Payer: Self-pay | Source: Home / Self Care | Attending: Internal Medicine

## 2020-03-29 DIAGNOSIS — I25110 Atherosclerotic heart disease of native coronary artery with unstable angina pectoris: Principal | ICD-10-CM

## 2020-03-29 DIAGNOSIS — E119 Type 2 diabetes mellitus without complications: Secondary | ICD-10-CM

## 2020-03-29 DIAGNOSIS — I509 Heart failure, unspecified: Secondary | ICD-10-CM

## 2020-03-29 DIAGNOSIS — L97501 Non-pressure chronic ulcer of other part of unspecified foot limited to breakdown of skin: Secondary | ICD-10-CM

## 2020-03-29 DIAGNOSIS — E11621 Type 2 diabetes mellitus with foot ulcer: Secondary | ICD-10-CM

## 2020-03-29 HISTORY — PX: CORONARY PRESSURE WIRE/FFR WITH 3D MAPPING: CATH118309

## 2020-03-29 HISTORY — PX: UPPER EXTREMITY ANGIOGRAPHY: CATH118270

## 2020-03-29 HISTORY — PX: LEFT HEART CATH AND CORONARY ANGIOGRAPHY: CATH118249

## 2020-03-29 LAB — PULMONARY FUNCTION TEST
FEF 25-75 Pre: 1.37 L/sec
FEF2575-%Pred-Pre: 48 %
FEV1-%Pred-Pre: 53 %
FEV1-Pre: 1.69 L
FEV1FVC-%Pred-Pre: 76 %
FEV6-%Pred-Pre: 68 %
FEV6-Pre: 2.7 L
FEV6FVC-%Pred-Pre: 102 %
FVC-%Pred-Pre: 69 %
FVC-Pre: 2.85 L
Pre FEV1/FVC ratio: 59 %
Pre FEV6/FVC Ratio: 98 %

## 2020-03-29 LAB — LIPID PANEL
Cholesterol: 119 mg/dL (ref 0–200)
HDL: 42 mg/dL (ref >40)
LDL Cholesterol: 65 mg/dL (ref 0–99)
Total CHOL/HDL Ratio: 2.8 RATIO
Triglycerides: 59 mg/dL (ref <150)
VLDL: 12 mg/dL (ref 0–40)

## 2020-03-29 LAB — CBC
HCT: 31 % — ABNORMAL LOW (ref 39.0–52.0)
Hemoglobin: 11.2 g/dL — ABNORMAL LOW (ref 13.0–17.0)
MCH: 33 pg (ref 26.0–34.0)
MCHC: 36.1 g/dL — ABNORMAL HIGH (ref 30.0–36.0)
MCV: 91.4 fL (ref 80.0–100.0)
Platelets: 266 10*3/uL (ref 150–400)
RBC: 3.39 MIL/uL — ABNORMAL LOW (ref 4.22–5.81)
RDW: 12.3 % (ref 11.5–15.5)
WBC: 6.1 10*3/uL (ref 4.0–10.5)
nRBC: 0 % (ref 0.0–0.2)

## 2020-03-29 LAB — GLUCOSE, CAPILLARY
Glucose-Capillary: 119 mg/dL — ABNORMAL HIGH (ref 70–99)
Glucose-Capillary: 99 mg/dL (ref 70–99)
Glucose-Capillary: 88 mg/dL (ref 70–99)
Glucose-Capillary: 260 mg/dL — ABNORMAL HIGH (ref 70–99)
Glucose-Capillary: 83 mg/dL (ref 70–99)

## 2020-03-29 LAB — BASIC METABOLIC PANEL
Anion gap: 12 (ref 5–15)
BUN: 23 mg/dL (ref 8–23)
CO2: 24 mmol/L (ref 22–32)
Calcium: 8.8 mg/dL — ABNORMAL LOW (ref 8.9–10.3)
Chloride: 101 mmol/L (ref 98–111)
Creatinine, Ser: 1.38 mg/dL — ABNORMAL HIGH (ref 0.61–1.24)
GFR, Estimated: 57 mL/min — ABNORMAL LOW (ref >60)
Glucose, Bld: 129 mg/dL — ABNORMAL HIGH (ref 70–99)
Potassium: 3.8 mmol/L (ref 3.5–5.1)
Sodium: 137 mmol/L (ref 135–145)

## 2020-03-29 LAB — POCT ACTIVATED CLOTTING TIME
Activated Clotting Time: 220 seconds
Activated Clotting Time: 220 seconds
Activated Clotting Time: 172 seconds
Activated Clotting Time: 190 seconds

## 2020-03-29 LAB — HEPARIN LEVEL (UNFRACTIONATED): Heparin Unfractionated: 0.12 IU/mL — ABNORMAL LOW (ref 0.30–0.70)

## 2020-03-29 SURGERY — LEFT HEART CATH AND CORONARY ANGIOGRAPHY
Anesthesia: LOCAL

## 2020-03-29 MED ORDER — FENTANYL CITRATE (PF) 100 MCG/2ML IJ SOLN
INTRAMUSCULAR | Status: AC
  Filled 2020-03-29: qty 2

## 2020-03-29 MED ORDER — LABETALOL HCL 5 MG/ML IV SOLN: 10.0000 mg | INTRAVENOUS | Status: AC | PRN

## 2020-03-29 MED ORDER — HEPARIN (PORCINE) IN NACL 1000-0.9 UT/500ML-% IV SOLN
INTRAVENOUS | Status: DC | PRN
  Administered 2020-03-29: 500 mL

## 2020-03-29 MED ORDER — LIDOCAINE HCL (PF) 1 % IJ SOLN
INTRAMUSCULAR | Status: AC
  Filled 2020-03-29: qty 30

## 2020-03-29 MED ORDER — HEPARIN (PORCINE) 25000 UT/250ML-% IV SOLN
1600.0000 [IU]/h | INTRAVENOUS | Status: DC
  Administered 2020-03-29: 1400 [IU]/h via INTRAVENOUS
  Administered 2020-03-30 – 2020-04-02 (×6): 1600 [IU]/h via INTRAVENOUS
  Filled 2020-03-29 (×8): qty 250

## 2020-03-29 MED ORDER — SODIUM CHLORIDE 0.9 % IV SOLN: 250.0000 mL | INTRAVENOUS | Status: DC | PRN

## 2020-03-29 MED ORDER — NITROGLYCERIN 1 MG/10 ML FOR IR/CATH LAB
INTRA_ARTERIAL | Status: DC | PRN
  Administered 2020-03-29: 200 ug via INTRACORONARY

## 2020-03-29 MED ORDER — SODIUM CHLORIDE 0.9% FLUSH
3.0000 mL | Freq: Two times a day (BID) | INTRAVENOUS | Status: DC
  Administered 2020-03-29 – 2020-03-30 (×3): 3 mL via INTRAVENOUS

## 2020-03-29 MED ORDER — IOHEXOL 350 MG/ML SOLN
INTRAVENOUS | Status: DC | PRN
  Administered 2020-03-29: 120 mL

## 2020-03-29 MED ORDER — FENTANYL CITRATE (PF) 100 MCG/2ML IJ SOLN
INTRAMUSCULAR | Status: DC | PRN
  Administered 2020-03-29: 25 ug via INTRAVENOUS

## 2020-03-29 MED ORDER — HEPARIN SODIUM (PORCINE) 1000 UNIT/ML IJ SOLN
INTRAMUSCULAR | Status: DC | PRN
  Administered 2020-03-29: 2500 [IU] via INTRAVENOUS
  Administered 2020-03-29: 6000 [IU] via INTRAVENOUS

## 2020-03-29 MED ORDER — HEPARIN SODIUM (PORCINE) 1000 UNIT/ML IJ SOLN
INTRAMUSCULAR | Status: AC
  Filled 2020-03-29: qty 1

## 2020-03-29 MED ORDER — LIDOCAINE HCL (PF) 1 % IJ SOLN
INTRAMUSCULAR | Status: DC | PRN
  Administered 2020-03-29: 15 mL
  Administered 2020-03-29: 7 mL

## 2020-03-29 MED ORDER — ONDANSETRON HCL 4 MG/2ML IJ SOLN: 4.0000 mg | Freq: Four times a day (QID) | INTRAMUSCULAR | Status: DC | PRN

## 2020-03-29 MED ORDER — MIDAZOLAM HCL 2 MG/2ML IJ SOLN
INTRAMUSCULAR | Status: DC | PRN
  Administered 2020-03-29: 1 mg via INTRAVENOUS

## 2020-03-29 MED ORDER — VERAPAMIL HCL 2.5 MG/ML IV SOLN
INTRAVENOUS | Status: AC
  Filled 2020-03-29: qty 2

## 2020-03-29 MED ORDER — ASPIRIN 81 MG PO CHEW
CHEWABLE_TABLET | ORAL | Status: AC
  Filled 2020-03-29: qty 1

## 2020-03-29 MED ORDER — SODIUM CHLORIDE 0.9 % IV SOLN: INTRAVENOUS | Status: AC

## 2020-03-29 MED ORDER — SODIUM CHLORIDE 0.9% FLUSH: 3.0000 mL | INTRAVENOUS | Status: DC | PRN

## 2020-03-29 MED ORDER — VERAPAMIL HCL 2.5 MG/ML IV SOLN
INTRAVENOUS | Status: DC | PRN
  Administered 2020-03-29: 10 mL via INTRA_ARTERIAL

## 2020-03-29 MED ORDER — NITROGLYCERIN 1 MG/10 ML FOR IR/CATH LAB
INTRA_ARTERIAL | Status: AC
  Filled 2020-03-29: qty 10

## 2020-03-29 MED ORDER — ASPIRIN 81 MG PO CHEW
81.0000 mg | CHEWABLE_TABLET | Freq: Every day | ORAL | Status: DC
  Administered 2020-03-29 – 2020-04-02 (×5): 81 mg via ORAL
  Filled 2020-03-29 (×4): qty 1

## 2020-03-29 MED ORDER — HEPARIN (PORCINE) IN NACL 1000-0.9 UT/500ML-% IV SOLN
INTRAVENOUS | Status: AC
  Filled 2020-03-29: qty 1000

## 2020-03-29 MED ORDER — HYDRALAZINE HCL 20 MG/ML IJ SOLN
10.0000 mg | INTRAMUSCULAR | Status: AC | PRN
Start: 2020-03-29 — End: 2020-03-29

## 2020-03-29 MED ORDER — MIDAZOLAM HCL 2 MG/2ML IJ SOLN
INTRAMUSCULAR | Status: AC
  Filled 2020-03-29: qty 2

## 2020-03-29 MED ORDER — ACETAMINOPHEN 325 MG PO TABS: 650.0000 mg | ORAL_TABLET | ORAL | Status: DC | PRN

## 2020-03-29 SURGICAL SUPPLY — 20 items
BAG SNAP BAND KOVER 36X36 (MISCELLANEOUS) ×1 IMPLANT
CATH 5FR JL3.5 JR4 ANG PIG MP (CATHETERS) ×1 IMPLANT
CATH INFINITI 5FR JL4 (CATHETERS) ×1 IMPLANT
CATH INFINITI 5FR MPB2 (CATHETERS) ×1 IMPLANT
CATH LAUNCHER 5F EBU3.5 (CATHETERS) ×1 IMPLANT
COVER DOME SNAP 22 D (MISCELLANEOUS) ×1 IMPLANT
DEVICE CONTINUOUS FLUSH (MISCELLANEOUS) ×1 IMPLANT
DEVICE RAD COMP TR BAND LRG (VASCULAR PRODUCTS) ×1 IMPLANT
GLIDESHEATH SLEND A-KIT 6F 22G (SHEATH) ×1 IMPLANT
GUIDEWIRE INQWIRE 1.5J.035X260 (WIRE) IMPLANT
GUIDEWIRE PRESSURE X 175 (WIRE) ×1 IMPLANT
INQWIRE 1.5J .035X260CM (WIRE) ×3
KIT ESSENTIALS PG (KITS) ×1 IMPLANT
KIT HEART LEFT (KITS) ×3 IMPLANT
PACK CARDIAC CATHETERIZATION (CUSTOM PROCEDURE TRAY) ×3 IMPLANT
SHEATH PINNACLE 5F 10CM (SHEATH) ×1 IMPLANT
TRANSDUCER W/STOPCOCK (MISCELLANEOUS) ×3 IMPLANT
TUBING CIL FLEX 10 FLL-RA (TUBING) ×3 IMPLANT
WIRE EMERALD 3MM-J .035X150CM (WIRE) ×1 IMPLANT
WIRE HI TORQ VERSACORE-J 145CM (WIRE) ×1 IMPLANT

## 2020-03-29 NOTE — Progress Notes (Signed)
ANTICOAGULATION CONSULT NOTE   Pharmacy Consult for heparin Indication: chest pain/ACS   Labs: Recent Labs    03/27/20 1701 03/27/20 2337 03/28/20 0146 03/28/20 0429 03/28/20 1915 03/28/20 2124 03/29/20 0600  HGB 10.8*  --   --   --  11.3*  --  11.2*  HCT 34.0*  --   --   --  33.7*  --  31.0*  PLT 286  --   --   --  254  --  266  HEPARINUNFRC  --   --   --   --   --  <0.10* 0.12*  CREATININE 1.30* 1.36*  --  1.29*  --   --  1.38*  TROPONINIHS  --  353* 337*  --  309*  --   --     Assessment: 65 yo M presents w/ concern for NSTEMI.  S/p cath - needs CABG eval  Heparin to resume at 1830 pm  Goal of Therapy:  Heparin level 0.3-0.7 units/ml Monitor platelets by anticoagulation protocol: Yes   Plan:  Heparin at 1400 units / hr Monitor daily HL, CBC, s/sx bleeding  Thanks for allowing pharmacy to be a part of this patient's care.  Okey Regal, PharmD Clinical Pharmacist

## 2020-03-29 NOTE — Progress Notes (Addendum)
PROGRESS NOTE    Curtis Watkins  WPY:[id number] DOB: 01-Oct-1955 DOA: 03/27/2020 PCP: Doran Stabler, NP   Brief Narrative:  HPI On 03/27/20 by Dr. Lyda Perone Curtis Watkins is a 65 y.o. male with medical history significant of DM2, HTN.  Pt sent to ED from [facility] for new onset of BLE edema and SOB.  Symptoms onset over the past few days.  He had recently stopped taking HCTZ after noting his BP was low at home.  Since that time he has had progressive BLE edema, DOE, orthopnea.  Pt is COVID vaccinated and boosted.  Has had wounds on BLE feet over course of past couple of weeks.  No CP, no abd pain.  Interim history Patient presented with volume overload, found to have a BNP of 1383.  Echocardiogram showing CHF.  Cardiology consulted and appreciated, pending catheterization. Also noted to have osteo of the right toes, ortho consulted. Assessment & Plan   New onset combined systolic and diastolic CHF -Patient presented with lower extremity swelling and orthopnea along with fatigue -BNP on admission 1383 -Echocardiogram showed an EF of 40 to 45%, grade 2 diastolic dysfunction.  Mild LVH. -Continue Lasix 40 mg IV twice daily -Cardiology consulted and appreciated  NSTEMI -High-sensitivity troponin 353, 337 -Cardiology consulted and -Patient started on heparin -Pending lipid panel -Status post left heart catheterization showing severe two-vessel disease including the LAD and complex disease circumflex.  EF of 35 to 40%.  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, stage II -No previous labs for comparison -Creatinine currently 1.38, continue  to monitor BMP  Essential hypertension -BP currently soft -Currently placed on IV Lasix and lisinopril   Diabetes mellitus, type II -Metformin held -Continue insulin sliding scale CBG monitor -Hemoglobin A1c 6.8  Anemia -Hemoglobin currently 11.2 -Continue to monitor CBC  DVT Prophylaxis  Heparin  Code Status: Full  Family Communication: None at bedside  Disposition Plan:  Status is: Inpatient  Remains inpatient appropriate because:Inpatient level of care appropriate due to severity of illness   Dispo: The patient is from: Home              Anticipated d/c is to: Home              Anticipated d/c date is: 3 days              Patient currently is not medically stable to d/c.   Difficult to place patient No   Consultants Cardiology Orthopedics Cardiothoracic surgery  Procedures  Echocardiogram Cardiac cath  Antibiotics   Anti-infectives (From admission, onward)   None      Subjective:   Derec Mozingo seen and examined today.  Patient states that his breathing is better since coming to the hospital and being on medications.  He denies current chest pain, abdominal pain, nausea or vomiting, diarrhea constipation, dizziness or headache.    Objective:   Vitals:   03/29/20 1250 03/29/20 1255 03/29/20 1321 03/29/20 1340  BP: 110/77 118/84 117/82 122/85  Pulse: 75 79 79 81  Resp: 19 20 20 16   Temp:   98.3 F (36.8 C) 98.3 F (36.8 C)  TempSrc:   Oral Oral  SpO2: (!) 89% 92% 98% 100%  Weight:  Height:        Intake/Output Summary (Last 24 hours) at 03/29/2020 1413 Last data filed at 03/29/2020 1230 Gross per 24 hour  Intake 311.71 ml  Output 2025 ml  Net -1713.29 ml   Filed Weights   03/27/20 1654 03/28/20 1910 03/29/20 0300  Weight: 74.8 kg 75.5 kg 74.5 kg   Exam  General: Well developed, chronically ill-appearing, NAD  HEENT: NCAT, mucous membranes moist.   Cardiovascular: S1 S2 auscultated, RRR, no murmur  Respiratory: Diminished breath  sounds however clear  Abdomen: Soft, nontender, nondistended, + bowel sounds  Extremities: warm dry without cyanosis clubbing.  Lower extremity edema bilateral foot ulcers  Neuro: AAOx3, nonfocal  Psych: appropriate mood and affect, pleasant   Data Reviewed: I have personally reviewed following labs and imaging studies  CBC: Recent Labs  Lab 03/27/20 1701 03/28/20 1915 03/29/20 0600  WBC 6.7 6.6 6.1  HGB 10.8* 11.3* 11.2*  HCT 34.0* 33.7* 31.0*  MCV 100.0 94.7 91.4  PLT 286 254 266   Basic Metabolic Panel: Recent Labs  Lab 03/27/20 1701 03/27/20 2337 03/28/20 0429 03/29/20 0600  NA 138  --  137 137  K 4.3  --  3.9 3.8  CL 105  --  105 101  CO2 23  --  24 24  GLUCOSE 122*  --  115* 129*  BUN 19  --  18 23  CREATININE 1.30* 1.36* 1.29* 1.38*  CALCIUM 8.8*  --  8.7* 8.8*   GFR: Estimated Creatinine Clearance: 57 mL/min (A) (by C-G formula based on SCr of 1.38 mg/dL (H)). Liver Function Tests: No results for input(s): AST, ALT, ALKPHOS, BILITOT, PROT, ALBUMIN in the last 168 hours. No results for input(s): LIPASE, AMYLASE in the last 168 hours. No results for input(s): AMMONIA in the last 168 hours. Coagulation Profile: No results for input(s): INR, PROTIME in the last 168 hours. Cardiac Enzymes: No results for input(s): CKTOTAL, CKMB, CKMBINDEX, TROPONINI in the last 168 hours. BNP (last 3 results) No results for input(s): PROBNP in the last 8760 hours. HbA1C: Recent Labs    03/28/20 1915  HGBA1C 6.8*   CBG: Recent Labs  Lab 03/28/20 1213 03/28/20 2057 03/29/20 0607 03/29/20 1153 03/29/20 1323  GLUCAP 81 189* 119* 99 88   Lipid Profile: Recent Labs    03/29/20 0600  CHOL 119  HDL 42  LDLCALC 65  TRIG 59  CHOLHDL 2.8   Thyroid Function Tests: No results for input(s): TSH, T4TOTAL, FREET4, T3FREE, THYROIDAB in the last 72 hours. Anemia Panel: No results for input(s): VITAMINB12, FOLATE, FERRITIN, TIBC, IRON, RETICCTPCT in the last 72  hours. Urine analysis: No results found for: COLORURINE, APPEARANCEUR, LABSPEC, PHURINE, GLUCOSEU, HGBUR, BILIRUBINUR, KETONESUR, PROTEINUR, UROBILINOGEN, NITRITE, LEUKOCYTESUR Sepsis Labs: @LABRCNTIP (procalcitonin:4,lacticidven:4)  ) Recent Results (from the past 240 hour(s))  SARS CORONAVIRUS 2 (TAT 6-24 HRS) Nasopharyngeal Nasopharyngeal Swab     Status: None   Collection Time: 03/27/20 10:33 PM   Specimen: Nasopharyngeal Swab  Result Value Ref Range Status   SARS Coronavirus 2 NEGATIVE NEGATIVE Final    Comment: (NOTE) SARS-CoV-2 target nucleic acids are NOT DETECTED.  The SARS-CoV-2 RNA is generally detectable in upper and lower respiratory specimens during the acute phase of infection. Negative results do not preclude SARS-CoV-2 infection, do not rule out co-infections with other pathogens, and should not be used as the sole basis for treatment or other patient management decisions. Negative results must be combined with clinical observations, patient history, and epidemiological information. The expected  result is Negative.  Fact Sheet for Patients: HairSlick.no  Fact Sheet for Healthcare Providers: quierodirigir.com  This test is not yet approved or cleared by the [place] FDA and  has been authorized for detection and/or diagnosis of SARS-CoV-2 by FDA under an Emergency Use Authorization (EUA). This EUA will remain  in effect (meaning this test can be used) for the duration of the COVID-19 declaration under Se ction 564(b)(1) of the Act, 21 U.S.C. section 360bbb-3(b)(1), unless the authorization is terminated or revoked sooner.  Performed at [facility], [address] [place], [place] [address]       Radiology Studies: DG Chest 2 View  Result Date: 03/27/2020 CLINICAL DATA:  65 year old male with shortness of breath. EXAM: CHEST - 2 VIEW COMPARISON:  Chest radiograph dated 03/15/2016 FINDINGS:  The small bilateral pleural effusions with bibasilar atelectasis or infiltrate. No pneumothorax. The cardiac silhouette is within limits. No acute osseous pathology. Atherosclerotic calcification of the aorta. IMPRESSION: Small bilateral pleural effusions with bibasilar atelectasis or infiltrate. Electronically Signed   By: Elgie Collard M.D.   On: 03/27/2020 17:23   CARDIAC CATHETERIZATION  Result Date: 03/29/2020  Diabetic with severe two-vessel disease including the LAD and complex disease in the circumflex.  Ischemic cardiomyopathy with LVEF 35 to 40%.  LVEDP is less than 10 mmHg.  Left main is widely patent  Right coronary is widely patent but contains diffuse atherosclerosis without focal narrowing.  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 RECOMMENDATIONS:  Continue IV heparin  Continue to treat heart failure  Watch kidney function  T CTS evaluation to consider coronary artery bypass grafting with LIMA to the LAD.  SVG to circumflex or arterial graft assuming that the left radial has flow.Marland Kitchen  PERIPHERAL VASCULAR CATHETERIZATION  Result Date: 03/29/2020  Diabetic with severe two-vessel disease including the LAD and complex disease in the circumflex.  Ischemic cardiomyopathy with LVEF 35 to 40%.  LVEDP is less than 10 mmHg.  Left main is widely patent  Right coronary is widely patent but contains diffuse atherosclerosis without focal narrowing.  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 RECOMMENDATIONS:  Continue IV heparin  Continue to treat heart failure  Watch kidney function  T CTS evaluation to consider coronary artery bypass grafting with LIMA to the LAD.  SVG to circumflex or arterial graft assuming that the left radial has flow..  DG Foot Complete Left  Result Date: 03/27/2020 CLINICAL DATA:  Wounds on the second and third digits EXAM: LEFT FOOT - COMPLETE 3+ VIEW COMPARISON:  None. FINDINGS: There is no evidence of fracture or dislocation. There is no evidence of  arthropathy or other focal bone abnormality. Mild soft tissue swelling seen around the third through fifth distal phalanges. IMPRESSION: Negative. Electronically Signed   By: Jonna Clark M.D.   On: 03/27/2020 23:04   DG Foot Complete Right  Result Date: 03/27/2020 CLINICAL DATA:  Wounds second third digit EXAM: RIGHT FOOT COMPLETE - 3+ VIEW COMPARISON:  None. FINDINGS: There is cortical destruction and erosive type change seen at the distal phalanx the second third digits. There is overlying tissue swelling and nailbed irregularity noted. No displaced fracture is identified. Dorsal soft tissue swelling is seen. 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 Electronically Signed   By: Jonna Clark M.D.   On: 03/27/2020 23:03   ECHOCARDIOGRAM COMPLETE  Result Date: 03/28/2020    ECHOCARDIOGRAM REPORT   Patient Name:   Damacio L Breeze  Date of Exam: 03/28/2020 Medical Rec #:  [id number]   Height:       71.0 in Accession #:    [id number]  Weight:       165.0 lb Date of Birth:  09-Nov-1955  BSA:          1.943 m Patient Age:    64 years    BP:           123/80 mmHg Patient Gender: M           HR:           75 bpm. Exam Location:  Inpatient Procedure: 2D Echo, Color Doppler and Cardiac Doppler Indications:    I50.21 Acute systolic (congestive) heart failure  History:        Patient has no prior history of Echocardiogram examinations.                 Risk Factors:Hypertension and Diabetes.  Sonographer:    Irving Burton Senior RDCS Referring Phys: [phone] JARED M GARDNER IMPRESSIONS  1. Left ventricular ejection fraction, by estimation, is 40 to 45%. The left ventricle has mildly decreased function. There is mild left ventricular hypertrophy. Left ventricular diastolic parameters are consistent with Grade II diastolic dysfunction (pseudonormalization). Elevated left atrial pressure.  2. Right ventricular systolic function is normal. The right ventricular size is normal.  3. Left  atrial size was mildly dilated.  4. Mild mitral valve regurgitation.  5. The inferior vena cava is dilated in size with <50% respiratory variability, suggesting right atrial pressure of 15 mmHg. FINDINGS  Left Ventricle: LVEF is depressed with hypokineis of the distal septal, distal inferior, distal lateral walls; akinesis at very apex. Left ventricular ejection fraction, by estimation, is 40 to 45%. The left ventricle has mildly decreased function. The left ventricle demonstrates regional wall motion abnormalities. The left ventricular internal cavity size was normal in size. There is mild left ventricular hypertrophy. Left ventricular diastolic parameters are consistent with Grade II diastolic dysfunction (pseudonormalization). Elevated left atrial pressure. Right Ventricle: The right ventricular size is normal. Right vetricular wall thickness was not assessed. Right ventricular systolic function is normal. Left Atrium: Left atrial size was mildly dilated. Right Atrium: Right atrial size was normal in size. Pericardium: There is no evidence of pericardial effusion. Mitral Valve: The mitral valve is normal in structure. Mild mitral valve regurgitation. Tricuspid Valve: The tricuspid valve is normal in structure. Tricuspid valve regurgitation is trivial. Aortic Valve: The aortic valve is normal in structure. Aortic valve regurgitation is not visualized. Pulmonic Valve: The pulmonic valve was grossly normal. Pulmonic valve regurgitation is not visualized. Aorta: The aortic root is normal in size and structure. Venous: The inferior vena cava is dilated in size with less than 50% respiratory variability, suggesting right atrial pressure of 15 mmHg. IAS/Shunts: No atrial level shunt detected by color flow Doppler.  LEFT VENTRICLE PLAX 2D LVIDd:         4.60 cm      Diastology LVIDs:         3.50 cm      LV e' medial:    3.92 cm/s LV PW:         1.20 cm      LV E/e' medial:  32.1 LV IVS:        1.10 cm      LV e'  lateral:   5.44 cm/s LVOT diam:     2.20 cm      LV E/e' lateral: 23.2  LV SV:         71 LV SV Index:   37 LVOT Area:     3.80 cm  LV Volumes (MOD) LV vol d, MOD A2C: 78.9 ml LV vol d, MOD A4C: 142.0 ml LV vol s, MOD A2C: 46.2 ml LV vol s, MOD A4C: 84.8 ml LV SV MOD A2C:     32.7 ml LV SV MOD A4C:     142.0 ml LV SV MOD BP:      47.1 ml RIGHT VENTRICLE RV S prime:     9.14 cm/s TAPSE (M-mode): 2.7 cm LEFT ATRIUM             Index       RIGHT ATRIUM           Index LA diam:        4.40 cm 2.26 cm/m  RA Area:     11.10 cm LA Vol (A2C):   65.6 ml 33.76 ml/m RA Volume:   25.20 ml  12.97 ml/m LA Vol (A4C):   70.2 ml 36.13 ml/m LA Biplane Vol: 74.1 ml 38.14 ml/m  AORTIC VALVE LVOT Vmax:   74.60 cm/s LVOT Vmean:  55.100 cm/s LVOT VTI:    0.187 m  AORTA Ao Root diam: 3.10 cm MITRAL VALVE MV Area (PHT): 3.72 cm     SHUNTS MV Decel Time: 204 msec     Systemic VTI:  0.19 m MV E velocity: 126.00 cm/s  Systemic Diam: 2.20 cm MV A velocity: 79.60 cm/s MV E/A ratio:  1.58 Dietrich PatesPaula Ross MD Electronically signed by Dietrich PatesPaula Ross MD Signature Date/Time: 03/28/2020/11:51:17 AM    Final      Scheduled Meds: . aspirin  81 mg Oral Daily  . aspirin EC  81 mg Oral Daily  . atorvastatin  80 mg Oral Daily  . furosemide  40 mg Intravenous BID  . hydrocerin   Topical Daily  . insulin aspart  0-15 Units Subcutaneous TID WC  . lisinopril  5 mg Oral Daily  . sodium chloride flush  3 mL Intravenous Q12H  . sodium chloride flush  3 mL Intravenous Q12H  . sodium chloride flush  3 mL Intravenous Q12H   Continuous Infusions: . sodium chloride 250 mL (03/29/20 0957)  . sodium chloride 125 mL/hr at 03/29/20 1123  . sodium chloride    . heparin       LOS: 2 days   Time Spent in minutes   45 minutes  Aprill Banko D.O. on 03/29/2020 at 2:13 PM  Between 7am to 7pm - Please see pager noted on [email or web address]  After 7pm go to [email or web address]  And look for the night coverage person covering for me after hours  [facility] Office  [phone]

## 2020-03-29 NOTE — Interval H&P Note (Signed)
Cath Lab Visit (complete for each Cath Lab visit)  Clinical Evaluation Leading to the Procedure:   ACS: No.  Non-ACS:    Anginal Classification: CCS II  Anti-ischemic medical therapy: Minimal Therapy (1 class of medications)  Non-Invasive Test Results: No non-invasive testing performed  Prior CABG: No previous CABG      History and Physical Interval Note:  03/29/2020 9:13 AM  Curtis Watkins  has presented today for surgery, with the diagnosis of NSTEMI.  The various methods of treatment have been discussed with the patient and family. After consideration of risks, benefits and other options for treatment, the patient has consented to  Procedure(s): LEFT HEART CATH AND CORONARY ANGIOGRAPHY (N/A) as a surgical intervention.  The patient's history has been reviewed, patient examined, no change in status, stable for surgery.  I have reviewed the patient's chart and labs.  Questions were answered to the patient's satisfaction.     Lyn Records III

## 2020-03-29 NOTE — Plan of Care (Signed)
  Problem: Education: Goal: Ability to demonstrate management of disease process will improve Outcome: Progressing   Problem: Activity: Goal: Capacity to carry out activities will improve Outcome: Progressing   Problem: Safety: Goal: Ability to remain free from injury will improve Outcome: Progressing   

## 2020-03-29 NOTE — Consult Note (Signed)
[address]       [place] [address]             [phone]        Curtis Watkins [facility] Medical Record #[id number] Date of Birth: 05/31/1955  Referring: Lyn Records, MD Primary Care: Doran Stabler, NP Primary Cardiologist:No primary care provider on file.  Chief Complaint:   Shortness of breath, lower extremity swelling   History of Present Illness:     [name] is a 65 year old male with past history significant for type 2 diabetes mellitus, hypertension, and tobacco use.  He presented to an urgent care facility earlier this week with a primary complaint of shortness of breath of about 2 days duration with associated lower extremity swelling.  He was referred on to the [facility], ED where he was seen on 03/27/2020.  Initial EKG showed no acute ischemic changes.  Chest x-ray showed small bilateral pleural effusions.  High-sensitivity troponin was measured at about 300 remained near that level on serial studies.  He was admitted by the hospitalist service with the diagnosis of new onset congestive heart failure.  He was started on IV Lasix and oral lisinopril and quickly had improvement in his symptoms.  Cardiology service was consulted and further work-up with left heart catheterization was recommended.  That study was performed earlier today. In the cath lab,  Dr. Katrinka Blazing attempted to advance the catheter from the right radial artery to engage the coronary arteries but was unable to advance past the axillary artery.  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  The right femoral artery was then cannulated and the team proceeded with their two-vessel disease including the LAD and complex disease in the circumflex coronary artery.  Left ventriculogram was consistent with ischemic cardiomyopathy with left-ventricular ejection fraction of 35 to 40%.    We have been asked to evaluate Curtis Watkins for consideration  of surgical coronary revascularization.  Curtis Watkins was noted on initial assessment in the emergency room to also have significant diabetic foot ulcerations bilaterally.  X-rays of both feet have confirmed osteomyelitis in the distal portion of the right second and third toes.  He also has large chronic calluses Dr. Lajoyce Corners has been consulted.  He does not feel that he is a cellulitis or abscess and has recommended no surgical intervention at this time.  He will follow-up with Curtis Watkins as an outpatient.  Current Activity/ Functional Status:    Zubrod Score: At the time of surgery this patient's most appropriate activity status/level should be described as: []     0    Normal activity, no symptoms [x]     1    Restricted in physical strenuous activity but ambulatory, able to do out light work []     2    Ambulatory and capable of self care, unable to do work activities, up and about                 more than 50%  Of the time                            []     3    Only limited self care, in bed greater than 50% of waking hours []     4    Completely disabled, no self care, confined to bed or chair []     5  Moribund  Past Medical History:  Diagnosis Date  . Diabetes mellitus without complication (HCC)   . Hypertension     Past Surgical History:  Procedure Laterality Date  . CORONARY PRESSURE WIRE/FFR WITH 3D MAPPING N/A 03/29/2020   Procedure: Coronary Pressure Wire/FFR w/3D Mapping;  Surgeon: Lyn Records, MD;  Location: [facility] INVASIVE CV LAB;  Service: Cardiovascular;  Laterality: N/A;  . LEFT HEART CATH AND CORONARY ANGIOGRAPHY N/A 03/29/2020   Procedure: LEFT HEART CATH AND CORONARY ANGIOGRAPHY;  Surgeon: Lyn Records, MD;  Location: [facility] INVASIVE CV LAB;  Service: Cardiovascular;  Laterality: N/A;  . UPPER EXTREMITY ANGIOGRAPHY  03/29/2020   Procedure: Upper Extremity Angiography;  Surgeon: Lyn Records, MD;  Location: [facility] INVASIVE CV LAB;  Service: Cardiovascular;;  rt.  arm    Social History    Tobacco Use  Smoking Status Current Every Day Smoker  . Packs/day: 1.00  . Types: Cigarettes  Smokeless Tobacco Never Used    Social History   Substance and Sexual Activity  Alcohol Use Yes     No Known Allergies  Current Facility-Administered Medications  Medication Dose Route Frequency Provider Last Rate Last Admin  . 0.9 %  sodium chloride infusion  250 mL Intravenous PRN Lyn Records, MD 999 mL/hr at 03/29/20 0957 250 mL at 03/29/20 0957  . 0.9 %  sodium chloride infusion   Intravenous Continuous Lyn Records, MD 125 mL/hr at 03/29/20 1123 New Bag at 03/29/20 1123  . 0.9 %  sodium chloride infusion  250 mL Intravenous PRN Lyn Records, MD      . acetaminophen (TYLENOL) tablet 650 mg  650 mg Oral Q4H PRN Lyn Records, MD      . aspirin chewable tablet 81 mg  81 mg Oral Daily Lyn Records, MD   81 mg at 03/29/20 1127  . aspirin EC tablet 81 mg  81 mg Oral Daily Lyn Records, MD   81 mg at 03/29/20 1610  . atorvastatin (LIPITOR) tablet 80 mg  80 mg Oral Daily Lyn Records, MD      . furosemide (LASIX) injection 40 mg  40 mg Intravenous BID Lyn Records, MD   40 mg at 03/28/20 2357  . heparin ADULT infusion 100 units/mL (25000 units/290mL)  1,400 Units/hr Intravenous Continuous Mikhail, [name], DO      . hydrALAZINE (APRESOLINE) injection 10 mg  10 mg Intravenous Q20 Min PRN Lyn Records, MD      . hydrocerin (EUCERIN) cream   Topical Daily Lyn Records, MD      . insulin aspart (novoLOG) injection 0-15 Units  0-15 Units Subcutaneous TID WC Lyn Records, MD   3 Units at 03/28/20 0930  . labetalol (NORMODYNE) injection 10 mg  10 mg Intravenous Q10 min PRN Lyn Records, MD      . lisinopril (ZESTRIL) tablet 5 mg  5 mg Oral Daily Lyn Records, MD   5 mg at 03/28/20 1046  . ondansetron (ZOFRAN) injection 4 mg  4 mg Intravenous Q6H PRN Lyn Records, MD      . sodium chloride flush (NS) 0.9 % injection 3 mL  3 mL Intravenous Q12H Lyn Records, MD   3 mL at  03/28/20 1046  . sodium chloride flush (NS) 0.9 % injection 3 mL  3 mL Intravenous PRN Lyn Records, MD      . sodium chloride flush (NS) 0.9 % injection 3 mL  3 mL Intravenous Q12H Lyn Records, MD   3 mL at 03/28/20 1626  . sodium chloride flush (NS) 0.9 % injection 3 mL  3 mL Intravenous Q12H Lyn Records, MD      . sodium chloride flush (NS) 0.9 % injection 3 mL  3 mL Intravenous PRN Lyn Records, MD        Medications Prior to Admission  Medication Sig Dispense Refill Last Dose  . hydrochlorothiazide (HYDRODIURIL) 25 MG tablet Take 25 mg by mouth daily.   Past Week at Unknown time  . metFORMIN (GLUCOPHAGE) 1000 MG tablet Take 1,000 mg by mouth daily with breakfast.   03/27/2020 at Unknown time    No family history on file.   Review of Systems:     Cardiac Review of Systems: Y or  [    ]= no  Chest Pain [    ]  Resting SOB [ y  ] Exertional SOB  Cove.Etienne  ]  Orthopnea [  ]   Pedal Edema [  y ]    Palpitations [  ] Syncope  [  ]   Presyncope [   ]  General Review of Systems: [Y] = yes [  ]=no Constitional: recent weight change [  ]; anorexia [  ]; fatigue [  ]; nausea [  ]; night sweats [  ]; fever [  ]; or chills [  ]                                                                Eye : blurred vision [  ]; diplopia [   ]; vision changes [  ];  Amaurosis fugax[  ]; Resp: cough [ x ];  wheezing[  ];  hemoptysis[  ]; shortness of breath[ x ]; paroxysmal nocturnal dyspnea[  ]; dyspnea on exertion[ x ]; or orthopnea[ x ];  GI:  gallstones[  ], vomiting[  ];  dysphagia[  ]; melena[  ];  hematochezia [  ]; heartburn[  ];   Hx of  Colonoscopy[  ]; GU: kidney stones [  ]; hematuria[  ];   dysuria [  ];  nocturia[  ];  history of     obstruction [  ]; urinary frequency [  ]             Skin: rash, swelling[  ];, hair loss[  ];  peripheral edema[  ];  or itching[  ]; Musculosketetal: myalgias[  ];  joint swelling[  ];  joint erythema[  ];  joint pain[  ];  back pain[  ];  Heme/Lymph:  bruising[  ];  bleeding[  ];  anemia[  ];  Neuro: TIA[  ];  headaches[  ];  stroke[  ];  vertigo[  ];  seizures[  ];   paresthesias[  ];  difficulty walking[  ];  Psych:depression[  ]; anxiety[  ];  Endocrine: diabetes[  ];  thyroid dysfunction[  ];               Physical Exam: BP 122/85 (BP Location: Left Arm)   Pulse 81   Temp 98.3 F (36.8 C) (Oral)   Resp 16   Ht 5\' 11"  (1.803 m)   Wt 74.5 kg Comment: scale b  SpO2 100%   BMI 22.90 kg/m    General appearance: alert, cooperative and no distress Head: Normocephalic, without obvious abnormality, atraumatic Neck: no adenopathy, no carotid bruit, no JVD and supple, symmetrical, trachea midline Resp: clear to auscultation bilaterally Cardio: regular rate and rhythm, S1, S2 normal, no murmur, click, rub or gallop GI: soft, non-tender; bowel sounds normal; no masses,  no organomegaly Extremities: All are warm and well-perfused.  There is a TR band overlying the right radial artery.  Despite this, I am able to palpate a weak pulse distal to the TR band.  He has a strongly palpable left radial pulse.  Modified Allen's test utilizing pulse oximetry placed to the left thumb shows edema maintains a pulsatile waveform in the thumb with the left radial artery occluded. Neurologic: Alert and oriented X 3, normal strength and tone. Normal symmetric reflexes. Normal coordination and gait  Diagnostic Studies & Laboratory data:  Coronary Pressure Wire/FFR w/3D Mapping  LEFT HEART CATH AND CORONARY ANGIOGRAPHY  Upper Extremity Angiography    Conclusion   Diabetic with severe two-vessel disease including the LAD and complex disease in the circumflex.  Ischemic cardiomyopathy with LVEF 35 to 40%.  LVEDP is less than 10 mmHg.  Left main is widely patent  Right coronary is widely patent but contains diffuse atherosclerosis without focal narrowing.  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  RECOMMENDATIONS:   Continue IV  heparin  Continue to treat heart failure  Watch kidney function  T CTS evaluation to consider coronary artery bypass grafting with LIMA to the LAD.  SVG to circumflex or arterial graft assuming that the left radial has flow..   Recommendations  Antiplatelet/Anticoag Recommend Aspirin 81mg  daily for moderate CAD.   Surgeon Notes    03/29/2020 10:29 AM CV Procedure signed by 05/27/2020, MD    Indications  Acute on chronic combined systolic and diastolic CHF (congestive heart failure) (HCC) [I50.43 (ICD-10-CM)]  Non-ST elevation (NSTEMI) myocardial infarction (HCC) [I21.4 (ICD-10-CM)]   Procedural Details  Technical Details The right radial area was sterilely prepped and draped. Intravenous sedation with Versed and fentanyl was administered. 1% Xylocaine was infiltrated to achieve local analgesia. Using real-time vascular ultrasound, a double wall stick with an angiocath was utilized to obtain intra-arterial access. A VUS image was saved for the record.  The guidewire would not advance into the subclavian beyond the axilla.  Angiography demonstrated 99% stenosis with a systolic blood pressure of 60 mmHg.  Due to obstruction of the right radial, we switched over to the right femoral.  Real-time vascular ultrasound was used to identify the bifurcation.  Fluoroscopic imaging was used to identify the femoral head.  1% lidocaine was used to achieve anesthesia.  A Seldinger needle was then used to perform an anterior wall stick on 1 pass.  A 5 French sheath was then placed.  Angiography was performed with a B2 multipurpose catheter.  Hemodynamic recordings were made with the catheter as well.  A 4 cm left Judkins catheter was also required.  After identifying significant disease in the left circumflex and LAD, borderline significance in the LAD was interrogated with RFR using the Abbott Combo 1 pressure wire.  A 3.5 cm EBU guide catheter was used.  A total of 8500 units of heparin was  administered.  ACT was documented to be greater than 240 seconds.  DFR across the proximal to mid stenosis in the LAD was 0.62 which is strongly positive for ischemia. Hemostasis was achieved using  a pneumatic band.  During this procedure the patient is administered a total of Versed 1 mg and Fentanyl 25 mg to achieve and maintain moderate conscious sedation.  The patient's heart rate, blood pressure, and oxygen saturation are monitored continuously during the procedure. The period of conscious sedation is 64 minutes, of which I was present face-to-face 100% of this time. Estimated blood loss <50 mL.   During this procedure medications were administered to achieve and maintain moderate conscious sedation while the patient's heart rate, blood pressure, and oxygen saturation were continuously monitored and I was present face-to-face 100% of this time.   Medications (Filter: Administrations occurring from 0900 to 1038 on 03/29/20) (important) Continuous medications are totaled by the amount administered until 03/29/20 1038.    Heparin (Porcine) in NaCl 1000-0.9 UT/500ML-% SOLN (mL) Total volume:  500 mL  Date/Time Rate/Dose/Volume Action   03/29/20 0911 500 mL Given    Heparin (Porcine) in NaCl 1000-0.9 UT/500ML-% SOLN (mL) Total volume:  500 mL  Date/Time Rate/Dose/Volume Action   03/29/20 0911 500 mL Given    midazolam (VERSED) injection (mg) Total dose:  1 mg  Date/Time Rate/Dose/Volume Action   03/29/20 0920 1 mg Given    fentaNYL (SUBLIMAZE) injection (mcg) Total dose:  25 mcg  Date/Time Rate/Dose/Volume Action   03/29/20 0921 25 mcg Given    lidocaine (PF) (XYLOCAINE) 1 % injection (mL) Total volume:  22 mL  Date/Time Rate/Dose/Volume Action   03/29/20 0928 7 mL Given   0939 15 mL Given    Radial Cocktail/Verapamil only (mL) Total volume:  10 mL  Date/Time Rate/Dose/Volume Action   03/29/20 0930 10 mL Given    nitroGLYCERIN 1 mg/10 mL (100 mcg/mL) - IR/CATH LAB  (mcg) Total dose:  200 mcg  Date/Time Rate/Dose/Volume Action   03/29/20 0956 200 mcg Given    heparin sodium (porcine) injection (Units) Total dose:  8,500 Units  Date/Time Rate/Dose/Volume Action   03/29/20 1005 6,000 Units Given   1016 2,500 Units Given    iohexol (OMNIPAQUE) 350 MG/ML injection (mL) Total volume:  120 mL  Date/Time Rate/Dose/Volume Action   03/29/20 1037 120 mL Given    0.9 % sodium chloride infusion (mL/hr) Total volume:  683.21 mL Dosing weight:  74.8  Date/Time Rate/Dose/Volume Action   03/29/20 0920 *Not included in total MAR Hold   0957 250 mL - 999 mL/hr Bolus from Bag    aspirin EC tablet 81 mg (mg) Total dose:  Cannot be calculated* Dosing weight:  74.8  *Administration dose not documented Date/Time Rate/Dose/Volume Action   03/29/20 0920 *Not included in total MAR Hold    atorvastatin (LIPITOR) tablet 80 mg (mg) Total dose:  Cannot be calculated* Dosing weight:  74.8  *Administration dose not documented Date/Time Rate/Dose/Volume Action   03/29/20 0920 *Not included in total MAR Hold   1000 *Not included in total Automatically Held    furosemide (LASIX) injection 40 mg (mg) Total dose:  Cannot be calculated* Dosing weight:  74.8  *Administration dose not documented Date/Time Rate/Dose/Volume Action   03/29/20 0920 *Not included in total MAR Hold   1000 *Not included in total Automatically Held    hydrocerin (EUCERIN) cream Total dose:  Cannot be calculated* Dosing weight:  74.8  *Administration dose not documented Date/Time Rate/Dose/Volume Action   03/29/20 0920 *Not included in total MAR Hold   1000 *Not included in total Automatically Held    insulin aspart (novoLOG) injection 0-15 Units (Units) Total dose:  Cannot be calculated* Dosing  weight:  74.8  *Administration dose not documented Date/Time Rate/Dose/Volume Action   03/29/20 0920 *Not included in total MAR Hold    lisinopril (ZESTRIL) tablet 5 mg (mg) Total dose:   Cannot be calculated* Dosing weight:  74.8  *Administration dose not documented Date/Time Rate/Dose/Volume Action   03/29/20 0920 *Not included in total MAR Hold   1000 *Not included in total Automatically Held    sodium chloride flush (NS) 0.9 % injection 3 mL (mL) Total dose:  Cannot be calculated* Dosing weight:  74.8  *Administration dose not documented Date/Time Rate/Dose/Volume Action   03/29/20 0920 *Not included in total MAR Hold   1000 *Not included in total Automatically Held    sodium chloride flush (NS) 0.9 % injection 3 mL (mL) Total dose:  Cannot be calculated* Dosing weight:  74.8  *Administration dose not documented Date/Time Rate/Dose/Volume Action   03/29/20 0920 *Not included in total MAR Hold    sodium chloride flush (NS) 0.9 % injection 3 mL (mL) Total dose:  Cannot be calculated* Dosing weight:  74.8  *Administration dose not documented Date/Time Rate/Dose/Volume Action   03/29/20 0920 *Not included in total MAR Hold   1000 *Not included in total Automatically Held    acetaminophen (TYLENOL) tablet 650 mg (mg) Total dose:  Cannot be calculated* Dosing weight:  74.8  *Administration dose not documented Date/Time Rate/Dose/Volume Action   03/29/20 0920 *Not included in total MAR Hold    ondansetron (ZOFRAN) injection 4 mg (mg) Total dose:  Cannot be calculated* Dosing weight:  74.8  *Administration dose not documented Date/Time Rate/Dose/Volume Action   03/29/20 0920 *Not included in total MAR Hold    Sedation Time  Sedation Time Physician-1: 1 hour 3 minutes 54 seconds   Radiation/Fluoro  Fluoro time: 11.9 (min) DAP: 21.8 (Gycm2) Cumulative Air Kerma: 395.5 (mGy)   Coronary Findings   Diagnostic Dominance: Right  Left Anterior Descending  There is mild diffuse disease throughout the vessel.  Mid LAD lesion is 85% stenosed.  First Diagonal Branch  Vessel is small in size. There is mild disease in the vessel.  Second Diagonal  Branch  There is moderate disease in the vessel.  Third Diagonal Branch  There is moderate disease in the vessel.  Left Circumflex  Prox Cx to Mid Cx lesion is 80% stenosed.  First Obtuse Marginal Branch  1st Mrg lesion is 90% stenosed.  Right Coronary Artery  There is moderate diffuse disease throughout the vessel.  Right Posterior Descending Artery  There is moderate disease in the vessel.  Right Posterior Atrioventricular Artery  There is moderate disease in the vessel.   Intervention   No interventions have been documented.  Peripheral Vascular Findings   Diagnostic   No diagnostic findings have been documented.  Intervention   No interventions have been documented.  Wall Motion  Resting                Left Heart  Left Ventricle LV end diastolic pressure is normal. The left ventricular ejection fraction is 35-45% by visual estimate.   Coronary Diagrams   Diagnostic Dominance: Right      Patient Name:  GENEVA PALLAS Date of Exam: 03/28/2020  Medical Rec #: [id number]  Height:    71.0 in  Accession #:  [id number] Weight:    165.0 lb  Date of Birth: [birth year] BSA:     1.943 m  Patient Age:  64 years  BP:      123/80 mmHg  Patient  Gender: M      HR:      75 bpm.  Exam Location: Inpatient   Procedure: 2D Echo, Color Doppler and Cardiac Doppler   Indications:  I50.21 Acute systolic (congestive) heart failure    History:    Patient has no prior history of Echocardiogram  examinations.         Risk Factors:Hypertension and Diabetes.    Sonographer:  Irving Burton Senior RDCS  Referring Phys: [phone] JARED M GARDNER   IMPRESSIONS    1. Left ventricular ejection fraction, by estimation, is 40 to 45%. The  left ventricle has mildly decreased function. There is mild left  ventricular hypertrophy. Left ventricular diastolic parameters are  consistent with Grade II diastolic dysfunction   (pseudonormalization). Elevated left atrial pressure.  2. Right ventricular systolic function is normal. The right ventricular  size is normal.  3. Left atrial size was mildly dilated.  4. Mild mitral valve regurgitation.  5. The inferior vena cava is dilated in size with <50% respiratory  variability, suggesting right atrial pressure of 15 mmHg.   FINDINGS  Left Ventricle: LVEF is depressed with hypokineis of the distal septal,  distal inferior, distal lateral walls; akinesis at very apex. Left  ventricular ejection fraction, by estimation, is 40 to 45%. The left  ventricle has mildly decreased function. The  left ventricle demonstrates regional wall motion abnormalities. The left  ventricular internal cavity size was normal in size. There is mild left  ventricular hypertrophy. Left ventricular diastolic parameters are  consistent with Grade II diastolic  dysfunction (pseudonormalization). Elevated left atrial pressure.   Right Ventricle: The right ventricular size is normal. Right vetricular  wall thickness was not assessed. Right ventricular systolic function is  normal.   Left Atrium: Left atrial size was mildly dilated.   Right Atrium: Right atrial size was normal in size.   Pericardium: There is no evidence of pericardial effusion.   Mitral Valve: The mitral valve is normal in structure. Mild mitral valve  regurgitation.   Tricuspid Valve: The tricuspid valve is normal in structure. Tricuspid  valve regurgitation is trivial.   Aortic Valve: The aortic valve is normal in structure. Aortic valve  regurgitation is not visualized.   Pulmonic Valve: The pulmonic valve was grossly normal. Pulmonic valve  regurgitation is not visualized.   Aorta: The aortic root is normal in size and structure.   Venous: The inferior vena cava is dilated in size with less than 50%  respiratory variability, suggesting right atrial pressure of 15 mmHg.   IAS/Shunts: No atrial level  shunt detected by color flow Doppler.     LEFT VENTRICLE  PLAX 2D  LVIDd:     4.60 cm   Diastology  LVIDs:     3.50 cm   LV e' medial:  3.92 cm/s  LV PW:     1.20 cm   LV E/e' medial: 32.1  LV IVS:    1.10 cm   LV e' lateral:  5.44 cm/s  LVOT diam:   2.20 cm   LV E/e' lateral: 23.2  LV SV:     71  LV SV Index:  37  LVOT Area:   3.80 cm    LV Volumes (MOD)  LV vol d, MOD A2C: 78.9 ml  LV vol d, MOD A4C: 142.0 ml  LV vol s, MOD A2C: 46.2 ml  LV vol s, MOD A4C: 84.8 ml  LV SV MOD A2C:   32.7 ml  LV SV MOD A4C:  142.0 ml  LV SV MOD BP:   47.1 ml   RIGHT VENTRICLE  RV S prime:   9.14 cm/s  TAPSE (M-mode): 2.7 cm   LEFT ATRIUM       Index    RIGHT ATRIUM      Index  LA diam:    4.40 cm 2.26 cm/m RA Area:   11.10 cm  LA Vol (A2C):  65.6 ml 33.76 ml/m RA Volume:  25.20 ml 12.97 ml/m  LA Vol (A4C):  70.2 ml 36.13 ml/m  LA Biplane Vol: 74.1 ml 38.14 ml/m  AORTIC VALVE  LVOT Vmax:  74.60 cm/s  LVOT Vmean: 55.100 cm/s  LVOT VTI:  0.187 m    AORTA  Ao Root diam: 3.10 cm   MITRAL VALVE  MV Area (PHT): 3.72 cm   SHUNTS  MV Decel Time: 204 msec   Systemic VTI: 0.19 m  MV E velocity: 126.00 cm/s Systemic Diam: 2.20 cm  MV A velocity: 79.60 cm/s  MV E/A ratio: 1.58   Dietrich PatesPaula Ross MD  Electronically signed by Dietrich PatesPaula Ross MD  Signature Date/Time: 03/28/2020/11:51:17 AM    ----------------------------------------------------------------------------------------------     Recent Radiology Findings:    DG Chest 2 View  Result Date: 03/27/2020 CLINICAL DATA:  65 year old male with shortness of breath. EXAM: CHEST - 2 VIEW COMPARISON:  Chest radiograph dated 03/15/2016 FINDINGS: The small bilateral pleural effusions with bibasilar atelectasis or infiltrate. No pneumothorax. The cardiac silhouette is within limits. No acute osseous pathology. Atherosclerotic calcification of the aorta.  IMPRESSION: Small bilateral pleural effusions with bibasilar atelectasis or infiltrate. Electronically Signed   By: Elgie CollardArash  Radparvar M.D.   On: 03/27/2020 17:23   DG Foot Complete Left  Result Date: 03/27/2020 CLINICAL DATA:  Wounds on the second and third digits EXAM: LEFT FOOT - COMPLETE 3+ VIEW COMPARISON:  None. FINDINGS: There is no evidence of fracture or dislocation. There is no evidence of arthropathy or other focal bone abnormality. Mild soft tissue swelling seen around the third through fifth distal phalanges. IMPRESSION: Negative. Electronically Signed   By: Jonna ClarkBindu  Avutu M.D.   On: 03/27/2020 23:04   DG Foot Complete Right  Result Date: 03/27/2020 CLINICAL DATA:  Wounds second third digit EXAM: RIGHT FOOT COMPLETE - 3+ VIEW COMPARISON:  None. FINDINGS: There is cortical destruction and erosive type change seen at the distal phalanx the second third digits. There is overlying tissue swelling and nailbed irregularity noted. No displaced fracture is identified. Dorsal soft tissue swelling is seen. 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 Electronically Signed   By: Jonna ClarkBindu  Avutu M.D.   On: 03/27/2020 23:03       I have independently reviewed the above radiologic studies and discussed with the patient   Recent Lab Findings: Lab Results  Component Value Date   WBC 6.1 03/29/2020   HGB 11.2 (L) 03/29/2020   HCT 31.0 (L) 03/29/2020   PLT 266 03/29/2020   GLUCOSE 129 (H) 03/29/2020   CHOL 119 03/29/2020   TRIG 59 03/29/2020   HDL 42 03/29/2020   LDLCALC 65 03/29/2020   NA 137 03/29/2020   K 3.8 03/29/2020   CL 101 03/29/2020   CREATININE 1.38 (H) 03/29/2020   BUN 23 03/29/2020   CO2 24 03/29/2020   HGBA1C 6.8 (H) 03/28/2020      Assessment / Plan:     65 year old male with history of type 2 diabetes mellitus, tobacco use, and hypertension but no past cardiac  history now presents with new onset  heart failure presumably  related to ischemic cardiomyopathy.  He has been treated with diuresis and antihypertensives.  His symptoms of shortness of breath and lower extremity edema have resolved since his admission 2 days ago.  He denies ever having any chest pain.  He has been seen in consultation by the cardiology team.  Left heart catheterization today demonstrates severe two-vessel coronary artery disease.  Ejection fraction is estimated 35 to 40%.  He has distal septal, lateral, and inferior hypokinesis with an akinetic region at the tip of the apex.  Echocardiogram does not demonstrate any significant valvular disease.  Coronary bypass grafting is his best option for coronary revascularization.  The nature of the procedure was described to him all and his wife who was listening by phone.  The questions regarding the procedure, the post-operative recovery, and post discharge recovery phases were all discussed and answered in detail to their satisfaction.  Curtis Watkins would like for [place] to proceed with preoperative work-up in preparation for coronary bypass grafting on Wednesday of next week.  In addition, he has osteomyelitis in the distal second and third toes of his right foot.  There is no active cellulitis or abscess.  He also has thick calluses on the plantar surface of both feet.  These lesions have all been evaluated by Dr. Lajoyce Corners and he recommends no surgical intervention at this time but he will follow-up with Curtis Watkins on outpatient basis  I  spent 25 minutes counseling the patient face to face.   Leary Roca, PA-C  03/29/2020 2:09 PM

## 2020-03-29 NOTE — CV Procedure (Signed)
   Severe segmental mid LAD stenosis with up to 80% narrowing and RFR 0.61 (significant less than 0.89).  3 diagonals contain diffuse disease.  Circumflex contains 90% OM 1 and 80% OM 2 forming a [place] 011 bifurcation stenosis.  Left main is widely patent  RCA contains moderate diffuse disease proximal to distal and including the PDA and left ventricular branches.  Anterior wall motion abnormality.  Estimated to be 35 to 40%.  EDP is normal.   Considering diabetes mellitus type 2, significant LV systolic dysfunction, diffuse disease, feel coronary bypass surgery should be considered as this would likely provide better long-term outcome.

## 2020-03-29 NOTE — Consult Note (Signed)
ORTHOPAEDIC CONSULTATION  REQUESTING PHYSICIAN: Edsel Petrin, DO  Chief Complaint: swelling lower extremities with chronic callous both feet  HPI: Curtis Watkins is a 65 y.o. male who presents with chronic ulcers both feet, with diabetes, HTN, smoker  Past Medical History:  Diagnosis Date  . Diabetes mellitus without complication (HCC)   . Hypertension    History reviewed. No pertinent surgical history. Social History   Socioeconomic History  . Marital status: Significant Other    Spouse name: Not on file  . Number of children: Not on file  . Years of education: Not on file  . Highest education level: Not on file  Occupational History  . Not on file  Tobacco Use  . Smoking status: Current Every Day Smoker    Packs/day: 1.00    Types: Cigarettes  . Smokeless tobacco: Never Used  Substance and Sexual Activity  . Alcohol use: Yes  . Drug use: Yes    Frequency: 3.0 times per week    Types: Marijuana  . Sexual activity: Not Currently  Other Topics Concern  . Not on file  Social History Narrative  . Not on file   Social Determinants of Health   Financial Resource Strain: Not on file  Food Insecurity: Not on file  Transportation Needs: Not on file  Physical Activity: Not on file  Stress: Not on file  Social Connections: Not on file   No family history on file. - negative except otherwise stated in the family history section No Known Allergies Prior to Admission medications   Medication Sig Start Date End Date Taking? Authorizing Provider  hydrochlorothiazide (HYDRODIURIL) 25 MG tablet Take 25 mg by mouth daily. 12/12/19  Yes [provider]  metFORMIN (GLUCOPHAGE) 1000 MG tablet Take 1,000 mg by mouth daily with breakfast. 12/12/19  Yes [provider]   DG Chest 2 View  Result Date: 03/27/2020 CLINICAL DATA:  65 year old male with shortness of breath. EXAM: CHEST - 2 VIEW COMPARISON:  Chest radiograph dated 03/15/2016 FINDINGS: The small  bilateral pleural effusions with bibasilar atelectasis or infiltrate. No pneumothorax. The cardiac silhouette is within limits. No acute osseous pathology. Atherosclerotic calcification of the aorta. IMPRESSION: Small bilateral pleural effusions with bibasilar atelectasis or infiltrate. Electronically Signed   By: Elgie Collard M.D.   On: 03/27/2020 17:23   DG Foot Complete Left  Result Date: 03/27/2020 CLINICAL DATA:  Wounds on the second and third digits EXAM: LEFT FOOT - COMPLETE 3+ VIEW COMPARISON:  None. FINDINGS: There is no evidence of fracture or dislocation. There is no evidence of arthropathy or other focal bone abnormality. Mild soft tissue swelling seen around the third through fifth distal phalanges. IMPRESSION: Negative. Electronically Signed   By: Jonna Clark M.D.   On: 03/27/2020 23:04   DG Foot Complete Right  Result Date: 03/27/2020 CLINICAL DATA:  Wounds second third digit EXAM: RIGHT FOOT COMPLETE - 3+ VIEW COMPARISON:  None. FINDINGS: There is cortical destruction and erosive type change seen at the distal phalanx the second third digits. There is overlying tissue swelling and nailbed irregularity noted. No displaced fracture is identified. Dorsal soft tissue swelling is seen. 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 Electronically Signed   By: Jonna Clark M.D.   On: 03/27/2020 23:03   ECHOCARDIOGRAM COMPLETE  Result Date: 03/28/2020    ECHOCARDIOGRAM REPORT   Patient Name:   Curtis Watkins Date of Exam: 03/28/2020 Medical Rec #:  [id number]  Height:       71.0 in Accession #:    [id number]  Weight:       165.0 lb Date of Birth:  [birth year]  BSA:          1.943 m Patient Age:    64 years    BP:           123/80 mmHg Patient Gender: M           HR:           75 bpm. Exam Location:  Inpatient Procedure: 2D Echo, Color Doppler and Cardiac Doppler Indications:    I50.21 Acute systolic (congestive) heart failure  History:         Patient has no prior history of Echocardiogram examinations.                 Risk Factors:Hypertension and Diabetes.  Sonographer:    Irving Burton Senior RDCS Referring Phys: [phone] JARED M GARDNER IMPRESSIONS  1. Left ventricular ejection fraction, by estimation, is 40 to 45%. The left ventricle has mildly decreased function. There is mild left ventricular hypertrophy. Left ventricular diastolic parameters are consistent with Grade II diastolic dysfunction (pseudonormalization). Elevated left atrial pressure.  2. Right ventricular systolic function is normal. The right ventricular size is normal.  3. Left atrial size was mildly dilated.  4. Mild mitral valve regurgitation.  5. The inferior vena cava is dilated in size with <50% respiratory variability, suggesting right atrial pressure of 15 mmHg. FINDINGS  Left Ventricle: LVEF is depressed with hypokineis of the distal septal, distal inferior, distal lateral walls; akinesis at very apex. Left ventricular ejection fraction, by estimation, is 40 to 45%. The left ventricle has mildly decreased function. The left ventricle demonstrates regional wall motion abnormalities. The left ventricular internal cavity size was normal in size. There is mild left ventricular hypertrophy. Left ventricular diastolic parameters are consistent with Grade II diastolic dysfunction (pseudonormalization). Elevated left atrial pressure. Right Ventricle: The right ventricular size is normal. Right vetricular wall thickness was not assessed. Right ventricular systolic function is normal. Left Atrium: Left atrial size was mildly dilated. Right Atrium: Right atrial size was normal in size. Pericardium: There is no evidence of pericardial effusion. Mitral Valve: The mitral valve is normal in structure. Mild mitral valve regurgitation. Tricuspid Valve: The tricuspid valve is normal in structure. Tricuspid valve regurgitation is trivial. Aortic Valve: The aortic valve is normal in structure. Aortic valve  regurgitation is not visualized. Pulmonic Valve: The pulmonic valve was grossly normal. Pulmonic valve regurgitation is not visualized. Aorta: The aortic root is normal in size and structure. Venous: The inferior vena cava is dilated in size with less than 50% respiratory variability, suggesting right atrial pressure of 15 mmHg. IAS/Shunts: No atrial level shunt detected by color flow Doppler.  LEFT VENTRICLE PLAX 2D LVIDd:         4.60 cm      Diastology LVIDs:         3.50 cm      LV e' medial:    3.92 cm/s LV PW:         1.20 cm      LV E/e' medial:  32.1 LV IVS:        1.10 cm      LV e' lateral:   5.44 cm/s LVOT diam:     2.20 cm      LV E/e' lateral: 23.2 LV SV:         71  LV SV Index:   37 LVOT Area:     3.80 cm  LV Volumes (MOD) LV vol d, MOD A2C: 78.9 ml LV vol d, MOD A4C: 142.0 ml LV vol s, MOD A2C: 46.2 ml LV vol s, MOD A4C: 84.8 ml LV SV MOD A2C:     32.7 ml LV SV MOD A4C:     142.0 ml LV SV MOD BP:      47.1 ml RIGHT VENTRICLE RV S prime:     9.14 cm/s TAPSE (M-mode): 2.7 cm LEFT ATRIUM             Index       RIGHT ATRIUM           Index LA diam:        4.40 cm 2.26 cm/m  RA Area:     11.10 cm LA Vol (A2C):   65.6 ml 33.76 ml/m RA Volume:   25.20 ml  12.97 ml/m LA Vol (A4C):   70.2 ml 36.13 ml/m LA Biplane Vol: 74.1 ml 38.14 ml/m  AORTIC VALVE LVOT Vmax:   74.60 cm/s LVOT Vmean:  55.100 cm/s LVOT VTI:    0.187 m  AORTA Ao Root diam: 3.10 cm MITRAL VALVE MV Area (PHT): 3.72 cm     SHUNTS MV Decel Time: 204 msec     Systemic VTI:  0.19 m MV E velocity: 126.00 cm/s  Systemic Diam: 2.20 cm MV A velocity: 79.60 cm/s MV E/A ratio:  1.58 Dietrich Pates MD Electronically signed by Dietrich Pates MD Signature Date/Time: 03/28/2020/11:51:17 AM    Final    - pertinent xrays, CT, MRI studies were reviewed and independently interpreted  Positive ROS: All other systems have been reviewed and were otherwise negative with the exception of those mentioned in the HPI and as above.  Physical Exam: General: Alert, no  acute distress Psychiatric: Patient is competent for consent with normal mood and affect Lymphatic: No axillary or cervical lymphadenopathy Cardiovascular: No pedal edema Respiratory: No cyanosis, no use of accessory musculature GI: No organomegaly, abdomen is soft and non-tender    Images:  @ENCIMAGES @  Labs:  Lab Results  Component Value Date   HGBA1C 6.8 (H) 03/28/2020    No results found for: ALBUMIN, PREALBUMIN, LABURIC  Neurologic: Patient does not have protective sensation bilateral lower extremities.   MUSCULOSKELETAL:   Skin: no cellulitis of either foot, swelling of second and third toe right foot, with swelling bilateral lower extremity, with massive callous bilateral feet  Strong DP pulses bilateral.  No cellulitis, no drainage either foot  Radiographs show chronic osteomyelitis tuft second and third toe  Assessment: Massive callous both feet with stable chronic osteomyelitis tuft of the second and third toes without cellulitis or abscess  Plan: I'll follow up as outpatient for routine foot care, no surgical intervention for both feet at this time  Thank you for the consult and the opportunity to see Mr. Abran Gavigan, MD [facility] [phone] 6:38 AM

## 2020-03-29 NOTE — Progress Notes (Signed)
SITE AREA:  Right femoral  SITE PRIOR TO REMOVAL:  LEVEL 0  PRESSURE APPLIED FOR: approximately 20 minutes  MANUAL: YES  PATIENT STATUS DURING PULL: WNL  POST PULL SITE:  LEVEL 0  POST PULL INSTRUCTIONS GIVEN: YES  POST PULL PULSES PRESENT: +2 bilateral pedal pulses  DRESSING APPLIED: yes, gauze with tegaderm  BEDREST BEGINS @ 1250  COMMENTS:

## 2020-03-29 NOTE — Progress Notes (Signed)
TCTS consulted for CABG evaluation. °

## 2020-03-29 NOTE — Plan of Care (Signed)
°  Problem: Education: °Goal: Ability to demonstrate management of disease process will improve °Outcome: Progressing °Goal: Ability to verbalize understanding of medication therapies will improve °Outcome: Progressing °Goal: Individualized Educational Video(s) °Outcome: Progressing °  °

## 2020-03-30 LAB — BASIC METABOLIC PANEL
Anion gap: 10 (ref 5–15)
BUN: 30 mg/dL — ABNORMAL HIGH (ref 8–23)
CO2: 24 mmol/L (ref 22–32)
Calcium: 8.2 mg/dL — ABNORMAL LOW (ref 8.9–10.3)
Chloride: 103 mmol/L (ref 98–111)
Creatinine, Ser: 1.58 mg/dL — ABNORMAL HIGH (ref 0.61–1.24)
GFR, Estimated: 49 mL/min — ABNORMAL LOW (ref >60)
Glucose, Bld: 149 mg/dL — ABNORMAL HIGH (ref 70–99)
Potassium: 3.9 mmol/L (ref 3.5–5.1)
Sodium: 137 mmol/L (ref 135–145)

## 2020-03-30 LAB — RETICULOCYTES
Immature Retic Fract: 7.6 % (ref 2.3–15.9)
RBC.: 3.45 MIL/uL — ABNORMAL LOW (ref 4.22–5.81)
Retic Count, Absolute: 63.1 10*3/uL (ref 19.0–186.0)
Retic Ct Pct: 1.8 % (ref 0.4–3.1)

## 2020-03-30 LAB — CBC
HCT: 28.7 % — ABNORMAL LOW (ref 39.0–52.0)
Hemoglobin: 10 g/dL — ABNORMAL LOW (ref 13.0–17.0)
MCH: 32.7 pg (ref 26.0–34.0)
MCHC: 34.8 g/dL (ref 30.0–36.0)
MCV: 93.8 fL (ref 80.0–100.0)
Platelets: 239 10*3/uL (ref 150–400)
RBC: 3.06 MIL/uL — ABNORMAL LOW (ref 4.22–5.81)
RDW: 12.6 % (ref 11.5–15.5)
WBC: 6.4 10*3/uL (ref 4.0–10.5)
nRBC: 0 % (ref 0.0–0.2)

## 2020-03-30 LAB — HEPARIN LEVEL (UNFRACTIONATED)
Heparin Unfractionated: <0.1 IU/mL — ABNORMAL LOW (ref 0.30–0.70)
Heparin Unfractionated: 0.32 IU/mL (ref 0.30–0.70)
Heparin Unfractionated: 0.34 IU/mL (ref 0.30–0.70)

## 2020-03-30 LAB — IRON AND TIBC
Iron: 94 ug/dL (ref 45–182)
Saturation Ratios: 33 % (ref 17.9–39.5)
TIBC: 281 ug/dL (ref 250–450)
UIBC: 187 ug/dL

## 2020-03-30 LAB — GLUCOSE, CAPILLARY
Glucose-Capillary: 134 mg/dL — ABNORMAL HIGH (ref 70–99)
Glucose-Capillary: 143 mg/dL — ABNORMAL HIGH (ref 70–99)
Glucose-Capillary: 190 mg/dL — ABNORMAL HIGH (ref 70–99)
Glucose-Capillary: 173 mg/dL — ABNORMAL HIGH (ref 70–99)

## 2020-03-30 LAB — VITAMIN B12: Vitamin B-12: 182 pg/mL (ref 180–914)

## 2020-03-30 LAB — FOLATE: Folate: 11.1 ng/mL (ref >5.9)

## 2020-03-30 LAB — FERRITIN: Ferritin: 249 ng/mL (ref 24–336)

## 2020-03-30 MED ORDER — ISOSORBIDE MONONITRATE ER 30 MG PO TB24
15.0000 mg | ORAL_TABLET | Freq: Every day | ORAL | Status: DC
  Administered 2020-03-30 – 2020-03-31 (×2): 15 mg via ORAL
  Filled 2020-03-30 (×2): qty 1

## 2020-03-30 MED ORDER — HYDRALAZINE HCL 25 MG PO TABS
25.0000 mg | ORAL_TABLET | Freq: Three times a day (TID) | ORAL | Status: DC
  Administered 2020-03-30 – 2020-04-02 (×9): 25 mg via ORAL
  Filled 2020-03-30 (×9): qty 1

## 2020-03-30 MED ORDER — CARVEDILOL 3.125 MG PO TABS
3.1250 mg | ORAL_TABLET | Freq: Two times a day (BID) | ORAL | Status: DC
  Administered 2020-03-30 – 2020-03-31 (×2): 3.125 mg via ORAL
  Filled 2020-03-30 (×2): qty 1

## 2020-03-30 NOTE — Progress Notes (Signed)
ANTICOAGULATION CONSULT NOTE   Pharmacy Consult for heparin Indication: chest pain/ACS   Labs: Recent Labs     0000 03/27/20 2337 03/28/20 0146 03/28/20 0429 03/28/20 1915 03/28/20 2124 03/29/20 0600 03/30/20 0249 03/30/20 1037 03/30/20 1848  HGB   < >  --   --   --  11.3*  --  11.2* 10.0*  --   --   HCT  --   --   --   --  33.7*  --  31.0* 28.7*  --   --   PLT  --   --   --   --  254  --  266 239  --   --   HEPARINUNFRC  --   --   --   --   --    < > 0.12* <0.10* 0.32 0.34  CREATININE  --  1.36*  --  1.29*  --   --  1.38* 1.58*  --   --   TROPONINIHS  --  353* 337*  --  309*  --   --   --   --   --    < > = values in this interval not displayed.    Assessment: 65 yo M presents w/ concern for NSTEMI. Heparin restarted post cath for severe 2V CAD. TCTS evaluating for CABG.  Heparin level continues to be at goal on confirmation check tonight at 0.34. No signs of bleeding noted and CBC WNL.   Goal of Therapy:  Heparin level 0.3-0.7 units/ml Monitor platelets by anticoagulation protocol: Yes   Plan:  Continue heparin 1600 units / hr Monitor CBC, s/sx bleeding, and plans for oral AC  Thanks for allowing pharmacy to be a part of this patient's care.  Sheppard Coil PharmD., BCPS Clinical Pharmacist 03/30/2020 9:12 PM

## 2020-03-30 NOTE — Progress Notes (Signed)
ANTICOAGULATION CONSULT NOTE   Pharmacy Consult for heparin Indication: chest pain/ACS   Labs: Recent Labs    03/27/20 2337 03/28/20 0146 03/28/20 0429 03/28/20 1915 03/28/20 2124 03/29/20 0600 03/30/20 0249  HGB  --   --   --  11.3*  --  11.2* 10.0*  HCT  --   --   --  33.7*  --  31.0* 28.7*  PLT  --   --   --  254  --  266 239  HEPARINUNFRC  --   --   --   --  <0.10* 0.12* <0.10*  CREATININE 1.36*  --  1.29*  --   --  1.38* 1.58*  TROPONINIHS 353* 337*  --  309*  --   --   --     Assessment: 65 yo M presents w/ concern for NSTEMI. Heparin restarted post cath for severe 2V CAD. TCTS evaluating for CABG.  Heparin level undetectable on 1400 units/hr. No bleeding reported per RN. RN reports that pt keeps bending arm and line becomes occluded - pt doesn't always call very quickly when this happens so may be off for some periods before RN notices.  Goal of Therapy:  Heparin level 0.3-0.7 units/ml Monitor platelets by anticoagulation protocol: Yes   Plan:  Increase heparin to 1600 units / hr Will f/u 6 hr heparin level  Thanks for allowing pharmacy to be a part of this patient's care.  Christoper Fabian, PharmD, BCPS Please see amion for complete clinical pharmacist phone list 03/30/2020 4:46 AM

## 2020-03-30 NOTE — Progress Notes (Signed)
PROGRESS NOTE    Curtis Watkins  HWT:[id number] DOB: [birth year] DOA: 03/27/2020 PCP: Doran Stabler, NP   Brief Narrative:  HPI On 03/27/20 by Dr. Lyda Perone Curtis Watkins is a 65 y.o. male with medical history significant of DM2, HTN.  Pt sent to ED from UC for new onset of BLE edema and SOB.  Symptoms onset over the past few days.  He had recently stopped taking HCTZ after noting his BP was low at home.  Since that time he has had progressive BLE edema, DOE, orthopnea.  Pt is COVID vaccinated and boosted.  Has had wounds on BLE feet over course of past couple of weeks.  No CP, no abd pain.  Interim history Patient presented with volume overload, found to have a BNP of 1383.  Echocardiogram showing CHF.  Cardiology consulted and appreciated, pending catheterization. Also noted to have osteo of the right toes, ortho consulted. Assessment & Plan   New onset combined systolic and diastolic CHF -Patient presented with lower extremity swelling and orthopnea along with fatigue -BNP on admission 1383 -Echocardiogram showed an EF of 40 to 45%, grade 2 diastolic dysfunction.  Mild LVH. -Continue Lasix 40 mg IV twice daily -Monitor intake and output, daily weights -Cardiology consulted and appreciated  NSTEMI -High-sensitivity troponin 353, 337 -Cardiology consulted and appreciated -Patient started on heparin -Lipid panel: Total cholesterol 119, HDL 42, LDL 65, triglycerides 59, VLDL 12 -Status post left heart catheterization showing severe two-vessel disease including the LAD and complex disease circumflex.  EF of 35 to 40%.  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, stage II -No previous labs for comparison -Creatinine currently 1.58 - rise possibly due to lasix -Will continue to monitor BMP  Essential hypertension -BP currently soft -Currently placed on IV Lasix and lisinopril   Diabetes mellitus, type II -Metformin held -Continue insulin sliding scale CBG monitor -Hemoglobin A1c 6.8  Normocytic Anemia -Hemoglobin currently 10 -Will obtain anemia panel -Continue to monitor CBC  DVT Prophylaxis  Heparin  Code Status: Full  Family Communication: None at bedside  Disposition Plan:  Status is: Inpatient  Remains inpatient appropriate because:Inpatient level of care appropriate due to severity of illness   Dispo: The patient is from: Home              Anticipated d/c is to: Home              Anticipated d/c date is: > 3 days              Patient currently is not medically stable to d/c.   Difficult to place patient No   Consultants Cardiology Orthopedics Cardiothoracic surgery  Procedures  Echocardiogram Cardiac cath  Antibiotics   Anti-infectives (From admission, onward)   None      Subjective:   Curtis Watkins seen and examined today.  Patient feels his breathing has improved.  Denies current chest pain, abdominal pain, nausea or vomiting, diarrhea or constipation, dizziness or headache.  Concerned about what is going on with him and the need for surgery.   Objective:   Vitals:   03/29/20 1949 03/30/20 0030 03/30/20 0315 03/30/20 0800  BP: (!) 150/60 (!) 150/55 (!) 171/57 Marland Kitchen)  191/67  Pulse: 80 85  75  Resp: 18 19 16    Temp: 98.6 F (37 C) 98.4 F (36.9 C) 98.3 F (36.8 C) 98.3 F (36.8 C)  TempSrc: Oral Oral Oral   SpO2: 98% 98% 97%   Weight:   74.2 kg   Height:        Intake/Output Summary (Last 24 hours) at 03/30/2020 1054 Last data filed at 03/30/2020 1000 Gross per 24 hour  Intake 1884.02 ml  Output 2900 ml  Net -1015.98 ml   Filed  Weights   03/28/20 1910 03/29/20 0300 03/30/20 0315  Weight: 75.5 kg 74.5 kg 74.2 kg   Exam  General: Well developed, chronically ill-appearing, NAD  HEENT: NCAT, mucous membranes moist.   Cardiovascular: S1 S2 auscultated, RRR, no murmur  Respiratory: Diminished breath sounds however clear  Abdomen: Soft, nontender, nondistended, + bowel sounds  Extremities: warm dry without cyanosis clubbing.  Lower extremity edema improving, bilateral foot ulcers  Neuro: AAOx3, nonfocal  Psych: anxious, although appropriate mood and affect   Data Reviewed: I have personally reviewed following labs and imaging studies  CBC: Recent Labs  Lab 03/27/20 1701 03/28/20 1915 03/29/20 0600 03/30/20 0249  WBC 6.7 6.6 6.1 6.4  HGB 10.8* 11.3* 11.2* 10.0*  HCT 34.0* 33.7* 31.0* 28.7*  MCV 100.0 94.7 91.4 93.8  PLT 286 254 266 239   Basic Metabolic Panel: Recent Labs  Lab 03/27/20 1701 03/27/20 2337 03/28/20 0429 03/29/20 0600 03/30/20 0249  NA 138  --  137 137 137  K 4.3  --  3.9 3.8 3.9  CL 105  --  105 101 103  CO2 23  --  24 24 24   GLUCOSE 122*  --  115* 129* 149*  BUN 19  --  18 23 30*  CREATININE 1.30* 1.36* 1.29* 1.38* 1.58*  CALCIUM 8.8*  --  8.7* 8.8* 8.2*   GFR: Estimated Creatinine Clearance: 49.6 mL/min (A) (by C-G formula based on SCr of 1.58 mg/dL (H)). Liver Function Tests: No results for input(s): AST, ALT, ALKPHOS, BILITOT, PROT, ALBUMIN in the last 168 hours. No results for input(s): LIPASE, AMYLASE in the last 168 hours. No results for input(s): AMMONIA in the last 168 hours. Coagulation Profile: No results for input(s): INR, PROTIME in the last 168 hours. Cardiac Enzymes: No results for input(s): CKTOTAL, CKMB, CKMBINDEX, TROPONINI in the last 168 hours. BNP (last 3 results) No results for input(s): PROBNP in the last 8760 hours. HbA1C: Recent Labs    03/28/20 1915  HGBA1C 6.8*   CBG: Recent Labs  Lab 03/29/20 1153 03/29/20 1323 03/29/20 1605  03/29/20 2101 03/30/20 0605  GLUCAP 99 88 260* 83 134*   Lipid Profile: Recent Labs    03/29/20 0600  CHOL 119  HDL 42  LDLCALC 65  TRIG 59  CHOLHDL 2.8   Thyroid Function Tests: No results for input(s): TSH, T4TOTAL, FREET4, T3FREE, THYROIDAB in the last 72 hours. Anemia Panel: No results for input(s): VITAMINB12, FOLATE, FERRITIN, TIBC, IRON, RETICCTPCT in the last 72 hours. Urine analysis: No results found for: COLORURINE, APPEARANCEUR, LABSPEC, PHURINE, GLUCOSEU, HGBUR, BILIRUBINUR, KETONESUR, PROTEINUR, UROBILINOGEN, NITRITE, LEUKOCYTESUR Sepsis Labs: @LABRCNTIP (procalcitonin:4,lacticidven:4)  ) Recent Results (from the past 240 hour(s))  SARS CORONAVIRUS 2 (TAT 6-24 HRS) Nasopharyngeal Nasopharyngeal Swab     Status: None   Collection Time: 03/27/20 10:33 PM   Specimen: Nasopharyngeal Swab  Result Value Ref Range Status   SARS Coronavirus 2 NEGATIVE NEGATIVE Final    Comment: (NOTE) SARS-CoV-2 target  nucleic acids are NOT DETECTED.  The SARS-CoV-2 RNA is generally detectable in upper and lower respiratory specimens during the acute phase of infection. Negative results do not preclude SARS-CoV-2 infection, do not rule out co-infections with other pathogens, and should not be used as the sole basis for treatment or other patient management decisions. Negative results must be combined with clinical observations, patient history, and epidemiological information. The expected result is Negative.  Fact Sheet for Patients: HairSlick.no  Fact Sheet for Healthcare Providers: quierodirigir.com  This test is not yet approved or cleared by the [place] FDA and  has been authorized for detection and/or diagnosis of SARS-CoV-2 by FDA under an Emergency Use Authorization (EUA). This EUA will remain  in effect (meaning this test can be used) for the duration of the COVID-19 declaration under Se ction 564(b)(1) of the  Act, 21 U.S.C. section 360bbb-3(b)(1), unless the authorization is terminated or revoked sooner.  Performed at [facility], [address], [place], [place] [address]       Radiology Studies: CARDIAC CATHETERIZATION  Result Date: 03/29/2020  Diabetic with severe two-vessel disease including the LAD and complex disease in the circumflex.  Ischemic cardiomyopathy with LVEF 35 to 40%.  LVEDP is less than 10 mmHg.  Left main is widely patent  Right coronary is widely patent but contains diffuse atherosclerosis without focal narrowing.  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 RECOMMENDATIONS:  Continue IV heparin  Continue to treat heart failure  Watch kidney function  T CTS evaluation to consider coronary artery bypass grafting with LIMA to the LAD.  SVG to circumflex or arterial graft assuming that the left radial has flow.Marland Kitchen  PERIPHERAL VASCULAR CATHETERIZATION  Result Date: 03/29/2020  Diabetic with severe two-vessel disease including the LAD and complex disease in the circumflex.  Ischemic cardiomyopathy with LVEF 35 to 40%.  LVEDP is less than 10 mmHg.  Left main is widely patent  Right coronary is widely patent but contains diffuse atherosclerosis without focal narrowing.  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 RECOMMENDATIONS:  Continue IV heparin  Continue to treat heart failure  Watch kidney function  T CTS evaluation to consider coronary artery bypass grafting with LIMA to the LAD.  SVG to circumflex or arterial graft assuming that the left radial has flow..    Scheduled Meds: . aspirin  81 mg Oral Daily  . aspirin EC  81 mg Oral Daily  . atorvastatin  80 mg Oral Daily  . furosemide  40 mg Intravenous BID  . hydrocerin   Topical Daily  . insulin aspart  0-15 Units Subcutaneous TID WC  . lisinopril  5 mg Oral Daily  . sodium chloride flush  3 mL Intravenous Q12H  . sodium chloride flush  3 mL Intravenous Q12H  . sodium chloride flush  3 mL  Intravenous Q12H   Continuous Infusions: . sodium chloride 250 mL (03/29/20 0957)  . sodium chloride    . heparin 1,600 Units/hr (03/30/20 0538)     LOS: 3 days   Time Spent in minutes   45 minutes  Curtis Watkins D.O. on 03/30/2020 at 10:54 AM  Between 7am to 7pm - Please see pager noted on [email or web address]  After 7pm go to [email or web address]  And look for the night coverage person covering for me after hours  [facility] Office  [phone]

## 2020-03-30 NOTE — Plan of Care (Signed)
  Problem: Cardiac: Goal: Ability to achieve and maintain adequate cardiopulmonary perfusion will improve Outcome: Progressing   

## 2020-03-30 NOTE — Progress Notes (Signed)
ANTICOAGULATION CONSULT NOTE   Pharmacy Consult for heparin Indication: chest pain/ACS   Labs: Recent Labs    03/27/20 2337 03/28/20 0146 03/28/20 0429 03/28/20 1915 03/28/20 2124 03/29/20 0600 03/30/20 0249 03/30/20 1037  HGB  --   --   --  11.3*  --  11.2* 10.0*  --   HCT  --   --   --  33.7*  --  31.0* 28.7*  --   PLT  --   --   --  254  --  266 239  --   HEPARINUNFRC  --   --   --   --    < > 0.12* <0.10* 0.32  CREATININE 1.36*  --  1.29*  --   --  1.38* 1.58*  --   TROPONINIHS 353* 337*  --  309*  --   --   --   --    < > = values in this interval not displayed.    Assessment: 65 yo M presents w/ concern for NSTEMI. Heparin restarted post cath for severe 2V CAD. TCTS evaluating for CABG.  F/u 6-hr HL 0.32, therapeutic. No signs of bleeding noted and CBC WNL.   Goal of Therapy:  Heparin level 0.3-0.7 units/ml Monitor platelets by anticoagulation protocol: Yes   Plan:  Continue heparin 1600 units / hr Will f/u with 6-hr confirmatory heparin level then transition to daily Monitor CBC, s/sx bleeding, and plans for oral [facility]  Thanks for allowing pharmacy to be a part of this patient's care.  Yvetta Coder, PharmD PGY1 Acute Care Pharmacy Resident Please refer to [facility] for unit-specific pharmacist

## 2020-03-30 NOTE — Progress Notes (Addendum)
Progress Note  Patient Name: Curtis Watkins Date of Encounter: 03/30/2020  [facility] Cardiologist: New patient  Subjective   He feels better today, he has no recurrent chest pain or shortness of breath.  Inpatient Medications    Scheduled Meds: . aspirin  81 mg Oral Daily  . aspirin EC  81 mg Oral Daily  . atorvastatin  80 mg Oral Daily  . carvedilol  3.125 mg Oral BID WC  . hydrALAZINE  25 mg Oral TID  . hydrocerin   Topical Daily  . insulin aspart  0-15 Units Subcutaneous TID WC  . isosorbide mononitrate  15 mg Oral Daily  . sodium chloride flush  3 mL Intravenous Q12H  . sodium chloride flush  3 mL Intravenous Q12H  . sodium chloride flush  3 mL Intravenous Q12H   Continuous Infusions: . sodium chloride 250 mL (03/29/20 0957)  . sodium chloride    . heparin 1,600 Units/hr (03/30/20 0538)   PRN Meds: sodium chloride, sodium chloride, acetaminophen, ondansetron (ZOFRAN) IV, sodium chloride flush, sodium chloride flush   Vital Signs    Vitals:   03/30/20 0030 03/30/20 0315 03/30/20 0800 03/30/20 1116  BP: (!) 150/55 (!) 171/57 (!) 191/67 113/76  Pulse: 85  75 74  Resp: 19 16  20   Temp: 98.4 F (36.9 C) 98.3 F (36.8 C) 98.3 F (36.8 C) 98.1 F (36.7 C)  TempSrc: Oral Oral  Oral  SpO2: 98% 97%  100%  Weight:  74.2 kg    Height:        Intake/Output Summary (Last 24 hours) at 03/30/2020 1133 Last data filed at 03/30/2020 1000 Gross per 24 hour  Intake 1884.02 ml  Output 2900 ml  Net -1015.98 ml   Last 3 Weights 03/30/2020 03/29/2020 03/28/2020  Weight (lbs) 163 lb 9.6 oz 164 lb 3.2 oz 166 lb 6.4 oz  Weight (kg) 74.208 kg 74.481 kg 75.479 kg      Telemetry    SR - Personally Reviewed  ECG    No new tracing - Personally Reviewed  Physical Exam   GEN: No acute distress.   Neck: No JVD Cardiac: RRR, no murmurs, rubs, or gallops.  Respiratory: Clear to auscultation bilaterally. GI: Soft, nontender, non-distended  MS: No edema; No deformity. Neuro:   Nonfocal  Psych: Normal affect   Labs    High Sensitivity Troponin:   Recent Labs  Lab 03/27/20 2337 03/28/20 0146 03/28/20 1915  TROPONINIHS 353* 337* 309*      Chemistry Recent Labs  Lab 03/28/20 0429 03/29/20 0600 03/30/20 0249  NA 137 137 137  K 3.9 3.8 3.9  CL 105 101 103  CO2 24 24 24   GLUCOSE 115* 129* 149*  BUN 18 23 30*  CREATININE 1.29* 1.38* 1.58*  CALCIUM 8.7* 8.8* 8.2*  GFRNONAA >60 57* 49*  ANIONGAP 8 12 10      Hematology Recent Labs  Lab 03/28/20 1915 03/29/20 0600 03/30/20 0249  WBC 6.6 6.1 6.4  RBC 3.56* 3.39* 3.06*  HGB 11.3* 11.2* 10.0*  HCT 33.7* 31.0* 28.7*  MCV 94.7 91.4 93.8  MCH 31.7 33.0 32.7  MCHC 33.5 36.1* 34.8  RDW 12.3 12.3 12.6  PLT 254 266 239    BNP Recent Labs  Lab 03/27/20 1811  BNP 1,383.1*     DDimer No results for input(s): DDIMER in the last 168 hours.   Radiology    CARDIAC CATHETERIZATION  Result Date: 03/29/2020  Diabetic with severe two-vessel disease including the  LAD and complex disease in the circumflex.  Ischemic cardiomyopathy with LVEF 35 to 40%.  LVEDP is less than 10 mmHg.  Left main is widely patent  Right coronary is widely patent but contains diffuse atherosclerosis without focal narrowing.  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 RECOMMENDATIONS:  Continue IV heparin  Continue to treat heart failure  Watch kidney function  T CTS evaluation to consider coronary artery bypass grafting with LIMA to the LAD.  SVG to circumflex or arterial graft assuming that the left radial has flow.Marland Kitchen  PERIPHERAL VASCULAR CATHETERIZATION  Result Date: 03/29/2020  Diabetic with severe two-vessel disease including the LAD and complex disease in the circumflex.  Ischemic cardiomyopathy with LVEF 35 to 40%.  LVEDP is less than 10 mmHg.  Left main is widely patent  Right coronary is widely patent but contains diffuse atherosclerosis without focal narrowing.  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  RECOMMENDATIONS:  Continue IV heparin  Continue to treat heart failure  Watch kidney function  T CTS evaluation to consider coronary artery bypass grafting with LIMA to the LAD.  SVG to circumflex or arterial graft assuming that the left radial has flow..   Cardiac Studies   TTE: 03/28/2020 LVEF: 40 to 45%. There is hypokinesis in the apical inferior, septal and anterior walls. Mild LVH. Grade 2 DD, Normal RV size and diastolic function. LA mildly dilated, mild MR.   Cardiac catheterization: 03/29/2020   Diabetic with severe two-vessel disease including the LAD and complex disease in the circumflex.  Ischemic cardiomyopathy with LVEF 35 to 40%.  LVEDP is less than 10 mmHg.  Left main is widely patent  Right coronary is widely patent but contains diffuse atherosclerosis without focal narrowing.  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  RECOMMENDATIONS:   Continue IV heparin  Continue to treat heart failure  Watch kidney function  T CTS evaluation to consider coronary artery bypass grafting with LIMA to the LAD.  SVG to circumflex or arterial graft assuming that the left radial has flow..  Patient Profile     65 y.o. male with a hx of HTN and DM who is being seen today for the evaluation of CHF & elevated troponin.  Lab showed: Crea 1.29, Troponin  353-> 337. Hb 10.8, Ptl 286. BNP 1383. CXR with small bilateral pleural effusions with bibasilar atelectasis or infiltrate.  Assessment & Plan    Nstemi, CAD, cath showed severe 2 vessel disease, he was seen by CT surgery and is scheduled for CABG next Wednesday April 04, 2020. Acute combined systolic diastolic CHF, LVEF 35 to 40% Hypertension Hyperlipidemia  -We will continue aspirin, heparin drip, and high-dose statin -He remains hypertensive, his creatinine is up what is possibly result of contrast nephropathy, I will hold his Lasix today he appears euvolemic his LVEDP was 10 yesterday.  I will also hold his  lisinopril for now and start him on Imdur and hydralazine and carvedilol 3.125 mg p.o. twice daily.  For questions or updates, please contact [facility] Please consult [email or web address] for contact info under     Signed, Tobias Alexander, MD  03/30/2020, 11:33 AM

## 2020-03-31 DIAGNOSIS — I5031 Acute diastolic (congestive) heart failure: Secondary | ICD-10-CM

## 2020-03-31 LAB — GLUCOSE, CAPILLARY
Glucose-Capillary: 125 mg/dL — ABNORMAL HIGH (ref 70–99)
Glucose-Capillary: 156 mg/dL — ABNORMAL HIGH (ref 70–99)
Glucose-Capillary: 188 mg/dL — ABNORMAL HIGH (ref 70–99)
Glucose-Capillary: 163 mg/dL — ABNORMAL HIGH (ref 70–99)

## 2020-03-31 LAB — BASIC METABOLIC PANEL
Anion gap: 10 (ref 5–15)
BUN: 28 mg/dL — ABNORMAL HIGH (ref 8–23)
CO2: 23 mmol/L (ref 22–32)
Calcium: 8.5 mg/dL — ABNORMAL LOW (ref 8.9–10.3)
Chloride: 104 mmol/L (ref 98–111)
Creatinine, Ser: 1.59 mg/dL — ABNORMAL HIGH (ref 0.61–1.24)
GFR, Estimated: 48 mL/min — ABNORMAL LOW (ref >60)
Glucose, Bld: 126 mg/dL — ABNORMAL HIGH (ref 70–99)
Potassium: 4.1 mmol/L (ref 3.5–5.1)
Sodium: 137 mmol/L (ref 135–145)

## 2020-03-31 LAB — HEPARIN LEVEL (UNFRACTIONATED): Heparin Unfractionated: 0.51 IU/mL (ref 0.30–0.70)

## 2020-03-31 LAB — CBC
HCT: 29.4 % — ABNORMAL LOW (ref 39.0–52.0)
Hemoglobin: 10.2 g/dL — ABNORMAL LOW (ref 13.0–17.0)
MCH: 32.4 pg (ref 26.0–34.0)
MCHC: 34.7 g/dL (ref 30.0–36.0)
MCV: 93.3 fL (ref 80.0–100.0)
Platelets: 255 10*3/uL (ref 150–400)
RBC: 3.15 MIL/uL — ABNORMAL LOW (ref 4.22–5.81)
RDW: 12.6 % (ref 11.5–15.5)
WBC: 5.8 10*3/uL (ref 4.0–10.5)
nRBC: 0 % (ref 0.0–0.2)

## 2020-03-31 MED ORDER — ISOSORBIDE MONONITRATE ER 30 MG PO TB24
30.0000 mg | ORAL_TABLET | Freq: Every day | ORAL | Status: DC
  Administered 2020-04-01 – 2020-04-02 (×2): 30 mg via ORAL
  Filled 2020-03-31 (×2): qty 1

## 2020-03-31 MED ORDER — CARVEDILOL 6.25 MG PO TABS
6.2500 mg | ORAL_TABLET | Freq: Two times a day (BID) | ORAL | Status: DC
  Administered 2020-03-31: 6.25 mg via ORAL
  Filled 2020-03-31: qty 1

## 2020-03-31 NOTE — Plan of Care (Signed)

## 2020-03-31 NOTE — Progress Notes (Addendum)
PROGRESS NOTE    Curtis Watkins  BPZ:[id number] DOB: Apr 11, 1955 DOA: 03/27/2020 PCP: Doran Stabler, NP   Brief Narrative:  HPI On 03/27/20 by Dr. Lyda Perone Curtis Watkins is a 65 y.o. male with medical history significant of DM2, HTN.  Pt sent to ED from UC for new onset of BLE edema and SOB.  Symptoms onset over the past few days.  He had recently stopped taking HCTZ after noting his BP was low at home.  Since that time he has had progressive BLE edema, DOE, orthopnea.  Pt is COVID vaccinated and boosted.  Has had wounds on BLE feet over course of past couple of weeks.  No CP, no abd pain.  Interim history Patient presented with volume overload, found to have a BNP of 1383.  Echocardiogram showing CHF.  Catheterization showed severe two-vessel disease, cardiothoracic surgery consulted and pending surgery.  Also noted to have osteo of the right toes, ortho consulted, recommended outpatient follow-up. Assessment & Plan   New onset combined systolic and diastolic CHF -Patient presented with lower extremity swelling and orthopnea along with fatigue -BNP on admission 1383 -Echocardiogram showed an EF of 40 to 45%, grade 2 diastolic dysfunction.  Mild LVH. -Was placed on IV Lasix 40 mg twice daily however cardiology has discontinued this. -Monitor intake and output, daily weights -Cardiology consulted and appreciated -Continue Coreg, hydralazine and Imdur.  NSTEMI -High-sensitivity troponin 353, 337 -Cardiology consulted and appreciated -Patient started on heparin -Lipid panel: Total cholesterol 119, HDL 42, LDL 65, triglycerides 59, VLDL 12 -Status post left heart catheterization showing severe two-vessel disease including the LAD and complex disease circumflex.  EF of 35 to 40%.  Recommendations: Continue IV heparin, treat heart failure, cardiothoracic surgery consulted- possible intervention next week -Lasix discontinued.  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, stage II -No previous labs for comparison -Creatinine currently 1.59 - rise possibly due to lasix -Will continue to monitor BMP  Essential hypertension -BP currently soft -Currently placed on IV Lasix and lisinopril   Diabetes mellitus, type II -Metformin held -Continue insulin sliding scale CBG monitor -Hemoglobin A1c 6.8  Normocytic Anemia -Hemoglobin currently 10.2-stable -Anemia panel showed iron 94, ferritin 249, saturation ratio 33 -Continue to monitor CBC  DVT Prophylaxis  Heparin  Code Status: Full  Family Communication: None at bedside  Disposition Plan:  Status is: Inpatient  Remains inpatient appropriate because:Inpatient level of care appropriate due to severity of illness   Dispo: The patient is from: Home              Anticipated d/c is to: Home              Anticipated d/c date is: > 3 days              Patient currently is not medically stable to d/c.   Difficult to place patient No   Consultants Cardiology Orthopedics Cardiothoracic surgery  Procedures  Echocardiogram Cardiac cath  Antibiotics   Anti-infectives (From admission, onward)   None      Subjective:   Curtis Watkins seen and examined today.  Patient feels breathing has improved as well as lower extremity swelling.  Denies current chest pain, abdominal pain, nausea or vomiting, diarrhea or constipation,  dizziness or headache.   Objective:   Vitals:   03/30/20 1116 03/30/20 1600 03/30/20 2027 03/31/20 0429  BP: 113/76 (!) 171/61 (!) 114/55 (!) 159/64  Pulse: 74 69 79 72  Resp: 20 20 12 20   Temp: 98.1 F (36.7 C) 98.3 F (36.8 C) 98.6 F (37 C) 98.3 F (36.8 C)  TempSrc:  Oral Oral Oral Oral  SpO2: 100% 97% 99% 100%  Weight:    73.6 kg  Height:        Intake/Output Summary (Last 24 hours) at 03/31/2020 1059 Last data filed at 03/31/2020 0500 Gross per 24 hour  Intake 1451.04 ml  Output 2250 ml  Net -798.96 ml   Filed Weights   03/29/20 0300 03/30/20 0315 03/31/20 0429  Weight: 74.5 kg 74.2 kg 73.6 kg   Exam  General: Well developed, chronically ill-appearing, NAD  HEENT: NCAT, mucous membranes moist.   Cardiovascular: S1 S2 auscultated, RRR  Respiratory: Diminished breath sounds however clear  Abdomen: Soft, nontender, nondistended, + bowel sounds  Extremities: warm dry without cyanosis clubbing.  Lower extremity edema, improving.  Bilateral foot ulcers.  Neuro: AAOx3, nonfocal  Psych: appropriate mood and affect, pleasant   Data Reviewed: I have personally reviewed following labs and imaging studies  CBC: Recent Labs  Lab 03/27/20 1701 03/28/20 1915 03/29/20 0600 03/30/20 0249 03/31/20 0440  WBC 6.7 6.6 6.1 6.4 5.8  HGB 10.8* 11.3* 11.2* 10.0* 10.2*  HCT 34.0* 33.7* 31.0* 28.7* 29.4*  MCV 100.0 94.7 91.4 93.8 93.3  PLT 286 254 266 239 255   Basic Metabolic Panel: Recent Labs  Lab 03/27/20 1701 03/27/20 2337 03/28/20 0429 03/29/20 0600 03/30/20 0249 03/31/20 0440  NA 138  --  137 137 137 137  K 4.3  --  3.9 3.8 3.9 4.1  CL 105  --  105 101 103 104  CO2 23  --  24 24 24 23   GLUCOSE 122*  --  115* 129* 149* 126*  BUN 19  --  18 23 30* 28*  CREATININE 1.30* 1.36* 1.29* 1.38* 1.58* 1.59*  CALCIUM 8.8*  --  8.7* 8.8* 8.2* 8.5*   GFR: Estimated Creatinine Clearance: 48.9 mL/min (A) (by C-G formula based on SCr of 1.59 mg/dL (H)). Liver Function Tests: No results for input(s): AST, ALT, ALKPHOS, BILITOT, PROT, ALBUMIN in the last 168 hours. No results for input(s): LIPASE, AMYLASE in the last 168 hours. No results for input(s): AMMONIA in the last 168 hours. Coagulation Profile: No results for input(s): INR, PROTIME in  the last 168 hours. Cardiac Enzymes: No results for input(s): CKTOTAL, CKMB, CKMBINDEX, TROPONINI in the last 168 hours. BNP (last 3 results) No results for input(s): PROBNP in the last 8760 hours. HbA1C: Recent Labs    03/28/20 1915  HGBA1C 6.8*   CBG: Recent Labs  Lab 03/30/20 0605 03/30/20 1117 03/30/20 1605 03/30/20 2036 03/31/20 0617  GLUCAP 134* 143* 190* 173* 125*   Lipid Profile: Recent Labs    03/29/20 0600  CHOL 119  HDL 42  LDLCALC 65  TRIG 59  CHOLHDL 2.8   Thyroid Function Tests: No results for input(s): TSH, T4TOTAL, FREET4, T3FREE, THYROIDAB in the last 72 hours. Anemia Panel: Recent Labs    03/30/20 1112 03/30/20 1121  VITAMINB12  --  182  FOLATE  --  11.1  FERRITIN  --  249  TIBC  --  281  IRON  --  94  RETICCTPCT 1.8  --    Urine analysis: No results found  for: COLORURINE, APPEARANCEUR, LABSPEC, PHURINE, GLUCOSEU, HGBUR, BILIRUBINUR, KETONESUR, PROTEINUR, UROBILINOGEN, NITRITE, LEUKOCYTESUR Sepsis Labs: @LABRCNTIP (procalcitonin:4,lacticidven:4)  ) Recent Results (from the past 240 hour(s))  SARS CORONAVIRUS 2 (TAT 6-24 HRS) Nasopharyngeal Nasopharyngeal Swab     Status: None   Collection Time: 03/27/20 10:33 PM   Specimen: Nasopharyngeal Swab  Result Value Ref Range Status   SARS Coronavirus 2 NEGATIVE NEGATIVE Final    Comment: (NOTE) SARS-CoV-2 target nucleic acids are NOT DETECTED.  The SARS-CoV-2 RNA is generally detectable in upper and lower respiratory specimens during the acute phase of infection. Negative results do not preclude SARS-CoV-2 infection, do not rule out co-infections with other pathogens, and should not be used as the sole basis for treatment or other patient management decisions. Negative results must be combined with clinical observations, patient history, and epidemiological information. The expected result is Negative.  Fact Sheet for Patients: 05/25/20  Fact Sheet for  Healthcare Providers: HairSlick.no  This test is not yet approved or cleared by the quierodirigir.com FDA and  has been authorized for detection and/or diagnosis of SARS-CoV-2 by FDA under an Emergency Use Authorization (EUA). This EUA will remain  in effect (meaning this test can be used) for the duration of the COVID-19 declaration under Se ction 564(b)(1) of the Act, 21 U.S.C. section 360bbb-3(b)(1), unless the authorization is terminated or revoked sooner.  Performed at [facility], [address], [place], [place]       Radiology Studies: No results found.   Scheduled Meds: . aspirin  81 mg Oral Daily  . aspirin EC  81 mg Oral Daily  . atorvastatin  80 mg Oral Daily  . carvedilol  3.125 mg Oral BID WC  . hydrALAZINE  25 mg Oral TID  . hydrocerin   Topical Daily  . insulin aspart  0-15 Units Subcutaneous TID WC  . isosorbide mononitrate  15 mg Oral Daily   Continuous Infusions: . heparin 1,600 Units/hr (03/31/20 0434)     LOS: 4 days   Time Spent in minutes   45 minutes  Diamon Reddinger D.O. on 03/31/2020 at 10:59 AM  Between 7am to 7pm - Please see pager noted on [email or web address]  After 7pm go to [email or web address]  And look for the night coverage person covering for me after hours  [facility] Office  [phone]

## 2020-03-31 NOTE — Progress Notes (Signed)
Progress Note  Patient Name: Curtis Watkins Date of Encounter: 03/31/2020  [facility] Cardiologist: New patient  Subjective   He feels better today, he has no recurrent chest pain or shortness of breath.  Inpatient Medications    Scheduled Meds: . aspirin  81 mg Oral Daily  . aspirin EC  81 mg Oral Daily  . atorvastatin  80 mg Oral Daily  . carvedilol  3.125 mg Oral BID WC  . hydrALAZINE  25 mg Oral TID  . hydrocerin   Topical Daily  . insulin aspart  0-15 Units Subcutaneous TID WC  . isosorbide mononitrate  15 mg Oral Daily   Continuous Infusions: . heparin 1,600 Units/hr (03/31/20 0434)   PRN Meds: acetaminophen, ondansetron (ZOFRAN) IV   Vital Signs    Vitals:   03/30/20 1600 03/30/20 2027 03/31/20 0429 03/31/20 1136  BP: (!) 171/61 (!) 114/55 (!) 159/64 (!) 153/55  Pulse: 69 79 72 71  Resp: 20 12 20 19   Temp: 98.3 F (36.8 C) 98.6 F (37 C) 98.3 F (36.8 C) 98.3 F (36.8 C)  TempSrc: Oral Oral Oral Oral  SpO2: 97% 99% 100% 98%  Weight:   73.6 kg   Height:        Intake/Output Summary (Last 24 hours) at 03/31/2020 1141 Last data filed at 03/31/2020 0500 Gross per 24 hour  Intake 1451.04 ml  Output 2250 ml  Net -798.96 ml   Last 3 Weights 03/31/2020 03/30/2020 03/29/2020  Weight (lbs) 162 lb 4.8 oz 163 lb 9.6 oz 164 lb 3.2 oz  Weight (kg) 73.619 kg 74.208 kg 74.481 kg      Telemetry    SR - Personally Reviewed  ECG    No new tracing - Personally Reviewed  Physical Exam   GEN: No acute distress.   Neck: No JVD Cardiac: RRR, no murmurs, rubs, or gallops.  Respiratory: Clear to auscultation bilaterally. GI: Soft, nontender, non-distended  MS: No edema; No deformity. Neuro:  Nonfocal  Psych: Normal affect   Labs    High Sensitivity Troponin:   Recent Labs  Lab 03/27/20 2337 03/28/20 0146 03/28/20 1915  TROPONINIHS 353* 337* 309*      Chemistry Recent Labs  Lab 03/29/20 0600 03/30/20 0249 03/31/20 0440  NA 137 137 137  K 3.8 3.9  4.1  CL 101 103 104  CO2 24 24 23   GLUCOSE 129* 149* 126*  BUN 23 30* 28*  CREATININE 1.38* 1.58* 1.59*  CALCIUM 8.8* 8.2* 8.5*  GFRNONAA 57* 49* 48*  ANIONGAP 12 10 10      Hematology Recent Labs  Lab 03/29/20 0600 03/30/20 0249 03/30/20 1112 03/31/20 0440  WBC 6.1 6.4  --  5.8  RBC 3.39* 3.06* 3.45* 3.15*  HGB 11.2* 10.0*  --  10.2*  HCT 31.0* 28.7*  --  29.4*  MCV 91.4 93.8  --  93.3  MCH 33.0 32.7  --  32.4  MCHC 36.1* 34.8  --  34.7  RDW 12.3 12.6  --  12.6  PLT 266 239  --  255    BNP Recent Labs  Lab 03/27/20 1811  BNP 1,383.1*     DDimer No results for input(s): DDIMER in the last 168 hours.   Radiology    No results found.  Cardiac Studies   TTE: 03/28/2020 LVEF: 40 to 45%. There is hypokinesis in the apical inferior, septal and anterior walls. Mild LVH. Grade 2 DD, Normal RV size and diastolic function. LA mildly dilated, mild  MR.   Cardiac catheterization: 03/29/2020   Diabetic with severe two-vessel disease including the LAD and complex disease in the circumflex.  Ischemic cardiomyopathy with LVEF 35 to 40%.  LVEDP is less than 10 mmHg.  Left main is widely patent  Right coronary is widely patent but contains diffuse atherosclerosis without focal narrowing.  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  RECOMMENDATIONS:   Continue IV heparin  Continue to treat heart failure  Watch kidney function  T CTS evaluation to consider coronary artery bypass grafting with LIMA to the LAD.  SVG to circumflex or arterial graft assuming that the left radial has flow..  Patient Profile     65 y.o. male with a hx of HTN and DM who is being seen today for the evaluation of CHF & elevated troponin.  Lab showed: Crea 1.29, Troponin  353-> 337. Hb 10.8, Ptl 286. BNP 1383. CXR with small bilateral pleural effusions with bibasilar atelectasis or infiltrate.  Assessment & Plan    Nstemi, CAD, cath showed severe 2 vessel disease, he was seen by CT  surgery and is scheduled for CABG next Wednesday April 04, 2020. Acute combined systolic diastolic CHF, LVEF 35 to 40% Hypertension Hyperlipidemia  -We will continue aspirin, heparin drip, and high-dose statin -He remains hypertensive, his creatinine is up what is possibly result of contrast nephropathy, I will hold his Lasix today he appears euvolemic his LVEDP was 10 on RHC on 2/4.  I will also hold his lisinopril for now. I will increase Imdur to 30 mg po daily and carvedilol to 6.25 mg p.o. twice daily.  For questions or updates, please contact [facility] Please consult [email or web address] for contact info under     Signed, Tobias Alexander, MD  03/31/2020, 11:41 AM

## 2020-03-31 NOTE — Progress Notes (Signed)
ANTICOAGULATION CONSULT NOTE   Pharmacy Consult for heparin Indication: chest pain/ACS   Labs: Recent Labs    03/28/20 1915 03/28/20 2124 03/29/20 0600 03/30/20 0249 03/30/20 1037 03/30/20 1848 03/31/20 0440  HGB 11.3*  --  11.2* 10.0*  --   --  10.2*  HCT 33.7*  --  31.0* 28.7*  --   --  29.4*  PLT 254  --  266 239  --   --  255  HEPARINUNFRC  --    < > 0.12* <0.10* 0.32 0.34 0.51  CREATININE  --   --  1.38* 1.58*  --   --  1.59*  TROPONINIHS 309*  --   --   --   --   --   --    < > = values in this interval not displayed.    Assessment: 65 yo M presents w/ concern for NSTEMI. Heparin restarted post cath for severe 2V CAD. TCTS evaluating for CABG.  Heparin level continues to be at goal this AM at 0.51. No signs of bleeding noted and CBC WNL.    Goal of Therapy:  Heparin level 0.3-0.7 units/ml Monitor platelets by anticoagulation protocol: Yes   Plan:  Continue IV heparin at 1,600 units / hr Follow daily HL, monitor CBC, s/sx bleeding, and plans for oral [facility]  Thanks for allowing pharmacy to be a part of this patient's care.  Yvetta Coder, PharmD PGY1 Acute Care Pharmacy Resident Please refer to [facility] for unit-specific pharmacist

## 2020-04-01 ENCOUNTER — Inpatient Hospital Stay (HOSPITAL_COMMUNITY): Payer: Self-pay

## 2020-04-01 DIAGNOSIS — Z01810 Encounter for preprocedural cardiovascular examination: Secondary | ICD-10-CM

## 2020-04-01 LAB — GLUCOSE, CAPILLARY
Glucose-Capillary: 111 mg/dL — ABNORMAL HIGH (ref 70–99)
Glucose-Capillary: 157 mg/dL — ABNORMAL HIGH (ref 70–99)
Glucose-Capillary: 171 mg/dL — ABNORMAL HIGH (ref 70–99)
Glucose-Capillary: 235 mg/dL — ABNORMAL HIGH (ref 70–99)

## 2020-04-01 LAB — BASIC METABOLIC PANEL
Anion gap: 9 (ref 5–15)
BUN: 29 mg/dL — ABNORMAL HIGH (ref 8–23)
CO2: 21 mmol/L — ABNORMAL LOW (ref 22–32)
Calcium: 8.1 mg/dL — ABNORMAL LOW (ref 8.9–10.3)
Chloride: 107 mmol/L (ref 98–111)
Creatinine, Ser: 1.71 mg/dL — ABNORMAL HIGH (ref 0.61–1.24)
GFR, Estimated: 44 mL/min — ABNORMAL LOW (ref >60)
Glucose, Bld: 212 mg/dL — ABNORMAL HIGH (ref 70–99)
Potassium: 4.2 mmol/L (ref 3.5–5.1)
Sodium: 137 mmol/L (ref 135–145)

## 2020-04-01 LAB — CBC
HCT: 28.4 % — ABNORMAL LOW (ref 39.0–52.0)
Hemoglobin: 9.4 g/dL — ABNORMAL LOW (ref 13.0–17.0)
MCH: 31.9 pg (ref 26.0–34.0)
MCHC: 33.1 g/dL (ref 30.0–36.0)
MCV: 96.3 fL (ref 80.0–100.0)
Platelets: 212 10*3/uL (ref 150–400)
RBC: 2.95 MIL/uL — ABNORMAL LOW (ref 4.22–5.81)
RDW: 12.4 % (ref 11.5–15.5)
WBC: 5.3 10*3/uL (ref 4.0–10.5)
nRBC: 0 % (ref 0.0–0.2)

## 2020-04-01 LAB — HEPARIN LEVEL (UNFRACTIONATED): Heparin Unfractionated: 0.65 IU/mL (ref 0.30–0.70)

## 2020-04-01 MED ORDER — CARVEDILOL 12.5 MG PO TABS
12.5000 mg | ORAL_TABLET | Freq: Two times a day (BID) | ORAL | Status: DC
  Administered 2020-04-01 – 2020-04-02 (×3): 12.5 mg via ORAL
  Filled 2020-04-01 (×3): qty 1

## 2020-04-01 MED ORDER — SODIUM CHLORIDE 0.9 % IV SOLN: INTRAVENOUS | Status: AC

## 2020-04-01 NOTE — Progress Notes (Signed)
  Mobility Specialist Criteria Algorithm Info.   Mobility Team: HOB elevated: Activity: Ambulated in hall; Dangled on edge of bed Range of motion: Active; All extremities Level of assistance: Independent Assistive device: None; Other (Comment) (IV Pole) Minutes sitting in chair:  Minutes stood: 6 minutes Minutes ambulated: 6 minutes Distance ambulated (ft): 200 ft Mobility response: Tolerated well Bed Position: Semi-fowlers  Patient received dangling EOB, agreeable to participate in mobility. Completed education with receptive feedback from pt on the importance of mobility, incentive spirometer and post CABG precautions pertaining to activity/mobility. Ambulated in hallway 200 feet pushing IV pole with very slow steady gait. He tolerated ambulation well without incident or complaints of SOB or chest pain.   Pre Ambulation:  HR 76 SPO2 98%  Post Ambulation: HR 80 SPO2 100%  04/01/2020 10:30 AM

## 2020-04-01 NOTE — Progress Notes (Signed)
PROGRESS NOTE    [name]  JQZ:[id number] DOB: 10/23/1955 DOA: 03/27/2020 PCP: Doran Stabler, NP   Brief Narrative:  HPI On 03/27/20 by Dr. Lyda Perone Curtis Watkins is a 65 y.o. male with medical history significant of DM2, HTN.  Pt sent to ED from [facility] for new onset of BLE edema and SOB.  Symptoms onset over the past few days.  He had recently stopped taking HCTZ after noting his BP was low at home.  Since that time he has had progressive BLE edema, DOE, orthopnea.  Pt is COVID vaccinated and boosted.  Has had wounds on BLE feet over course of past couple of weeks.  No CP, no abd pain.  Interim history Patient presented with volume overload, found to have a BNP of 1383.  Echocardiogram showing CHF.  Catheterization showed severe two-vessel disease, cardiothoracic surgery consulted and pending surgery on 04/03/20.  Also noted to have osteo of the right toes, ortho consulted, recommended outpatient follow-up. Assessment & Plan   New onset combined systolic and diastolic CHF -Patient presented with lower extremity swelling and orthopnea along with fatigue -BNP on admission 1383 -Echocardiogram showed an EF of 40 to 45%, grade 2 diastolic dysfunction.  Mild LVH. -Was placed on IV Lasix 40 mg twice daily however cardiology has discontinued this. -Monitor intake and output, daily weights -Cardiology consulted and appreciated -Continue Coreg, hydralazine and Imdur.  NSTEMI -High-sensitivity troponin 353, 337 -Cardiology consulted and appreciated -Patient started on heparin -Lipid panel: Total cholesterol 119, HDL 42, LDL 65, triglycerides 59, VLDL 12 -Status post left heart catheterization showing severe two-vessel disease including the LAD and complex disease circumflex.  EF of 35 to 40%.  Recommendations: Continue IV heparin, treat heart failure, cardiothoracic surgery consulted- surgery planned for 04/03/20 -Lasix discontinued.  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, stage II -No previous labs for comparison -Creatinine currently 1.71  -thought lasix was discontinued, creatinine continues to worsen -will place on gentle IVF and monitor BMP  Essential hypertension -Placed on Coreg, hydralazine, Imdur  Diabetes mellitus, type II -Metformin held -Continue insulin sliding scale CBG monitor -Hemoglobin A1c 6.8  Normocytic Anemia -Hemoglobin currently 9.4-stable -Anemia panel showed iron 94, ferritin 249, saturation ratio 33 -Continue to monitor CBC  DVT Prophylaxis  Heparin  Code Status: Full  Family Communication: None at bedside  Disposition Plan:  Status is: Inpatient  Remains inpatient appropriate because:Inpatient level of care appropriate due to severity of illness   Dispo: The patient is from: Home              Anticipated d/c is to: Home              Anticipated d/c date is: > 3 days              Patient currently is not medically stable to d/c.   Difficult to place patient No   Consultants Cardiology Orthopedics Cardiothoracic surgery  Procedures  Echocardiogram Cardiac cath  Antibiotics   Anti-infectives (From admission, onward)   None      Subjective:   Curtis Watkins seen and examined today.  Feels breathing as well as lower extremity swelling have improved.  Denies frank chest pain or shortness of breath, abdominal pain, nausea  or vomiting, diarrhea or constipation, dizziness or headache.   Objective:   Vitals:   03/31/20 1700 03/31/20 1954 04/01/20 0249 04/01/20 0547  BP: (!) 175/58 (!) 174/64 (!) 164/54 (!) 165/64  Pulse: 71 75 70 68  Resp:  16 16 17   Temp:  98.2 F (36.8 C) 98.2 F (36.8 C) 98 F (36.7  C)  TempSrc:  Oral Oral Oral  SpO2:  100% 100% 100%  Weight:   75.3 kg   Height:        Intake/Output Summary (Last 24 hours) at 04/01/2020 1125 Last data filed at 04/01/2020 0954 Gross per 24 hour  Intake 1070.19 ml  Output 2000 ml  Net -929.81 ml   Filed Weights   03/30/20 0315 03/31/20 0429 04/01/20 0249  Weight: 74.2 kg 73.6 kg 75.3 kg   Exam  General: Well developed, chronically ill-appearing, NAD  HEENT: NCAT, mucous membranes moist.   Cardiovascular: S1 S2 auscultated, RRR  Respiratory: Diminished breath sounds however clear, no wheezing   Abdomen: Soft, nontender, nondistended, + bowel sounds  Extremities: warm dry without cyanosis clubbing.  Lower extremity edema- improving.  Bilateral foot ulcers.  Neuro: AAOx3, nonfocal  Psych: appropriate mood and affect, pleasant   Data Reviewed: I have personally reviewed following labs and imaging studies  CBC: Recent Labs  Lab 03/28/20 1915 03/29/20 0600 03/30/20 0249 03/31/20 0440 04/01/20 0219  WBC 6.6 6.1 6.4 5.8 5.3  HGB 11.3* 11.2* 10.0* 10.2* 9.4*  HCT 33.7* 31.0* 28.7* 29.4* 28.4*  MCV 94.7 91.4 93.8 93.3 96.3  PLT 254 266 239 255 212   Basic Metabolic Panel: Recent Labs  Lab 03/28/20 0429 03/29/20 0600 03/30/20 0249 03/31/20 0440 04/01/20 0219  NA 137 137 137 137 137  K 3.9 3.8 3.9 4.1 4.2  CL 105 101 103 104 107  CO2 24 24 24 23  21*  GLUCOSE 115* 129* 149* 126* 212*  BUN 18 23 30* 28* 29*  CREATININE 1.29* 1.38* 1.58* 1.59* 1.71*  CALCIUM 8.7* 8.8* 8.2* 8.5* 8.1*   GFR: Estimated Creatinine Clearance: 46.5 mL/min (A) (by C-G formula based on SCr of 1.71 mg/dL (H)). Liver Function Tests: No results for input(s): AST, ALT, ALKPHOS, BILITOT, PROT, ALBUMIN in the last 168 hours. No results for input(s): LIPASE, AMYLASE in the last 168 hours. No results for input(s): AMMONIA in the last 168 hours. Coagulation Profile: No results for input(s): INR, PROTIME in the last 168 hours. Cardiac  Enzymes: No results for input(s): CKTOTAL, CKMB, CKMBINDEX, TROPONINI in the last 168 hours. BNP (last 3 results) No results for input(s): PROBNP in the last 8760 hours. HbA1C: No results for input(s): HGBA1C in the last 72 hours. CBG: Recent Labs  Lab 03/31/20 0617 03/31/20 1137 03/31/20 1559 03/31/20 1959 04/01/20 0750  GLUCAP 125* 156* 188* 163* 111*   Lipid Profile: No results for input(s): CHOL, HDL, LDLCALC, TRIG, CHOLHDL, LDLDIRECT in the last 72 hours. Thyroid Function Tests: No results for input(s): TSH, T4TOTAL, FREET4, T3FREE, THYROIDAB in the last 72 hours. Anemia Panel: Recent Labs    03/30/20 1112 03/30/20 1121  VITAMINB12  --  182  FOLATE  --  11.1  FERRITIN  --  249  TIBC  --  281  IRON  --  94  RETICCTPCT 1.8  --    Urine analysis: No results found for: COLORURINE, APPEARANCEUR, LABSPEC, PHURINE, GLUCOSEU, HGBUR, BILIRUBINUR, KETONESUR, PROTEINUR, UROBILINOGEN, NITRITE, LEUKOCYTESUR Sepsis Labs: @LABRCNTIP (procalcitonin:4,lacticidven:4)  ) Recent Results (from the past 240 hour(s))  SARS CORONAVIRUS 2 (  TAT 6-24 HRS) Nasopharyngeal Nasopharyngeal Swab     Status: None   Collection Time: 03/27/20 10:33 PM   Specimen: Nasopharyngeal Swab  Result Value Ref Range Status   SARS Coronavirus 2 NEGATIVE NEGATIVE Final    Comment: (NOTE) SARS-CoV-2 target nucleic acids are NOT DETECTED.  The SARS-CoV-2 RNA is generally detectable in upper and lower respiratory specimens during the acute phase of infection. Negative results do not preclude SARS-CoV-2 infection, do not rule out co-infections with other pathogens, and should not be used as the sole basis for treatment or other patient management decisions. Negative results must be combined with clinical observations, patient history, and epidemiological information. The expected result is Negative.  Fact Sheet for Patients: HairSlick.nohttps://www.fda.gov/media/138098/download  Fact Sheet for Healthcare  Providers: quierodirigir.comhttps://www.fda.gov/media/138095/download  This test is not yet approved or cleared by the [place] FDA and  has been authorized for detection and/or diagnosis of SARS-CoV-2 by FDA under an Emergency Use Authorization (EUA). This EUA will remain  in effect (meaning this test can be used) for the duration of the COVID-19 declaration under Se ction 564(b)(1) of the Act, 21 U.S.C. section 360bbb-3(b)(1), unless the authorization is terminated or revoked sooner.  Performed at [facility], [address] [place], [place] [address]       Radiology Studies: VAS US DOPPLER PRE CABG  Result Date: 04/01/2020 PREOPERATIVE VASCULAR EVALUATION  Indications:      Pre-CABG. Risk Factors:     Hypertension, Diabetes. Comparison Study: no prior Performing Technologist: Blanch MediaMegan Riddle RVS  Examination Guidelines: A complete evaluation includes B-mode imaging, spectral Doppler, color Doppler, and power Doppler as needed of all accessible portions of each vessel. Bilateral testing is considered an integral part of a complete examination. Limited examinations for reoccurring indications may be performed as noted.  Right Carotid Findings: +----------+-------+-------+--------+---------------------------------+--------+           PSV    EDV    StenosisDescribe                         Comments           cm/s   cm/s                                                     +----------+-------+-------+--------+---------------------------------+--------+ CCA Prox  104    21             heterogenous                              +----------+-------+-------+--------+---------------------------------+--------+ CCA Distal88     13             heterogenous                              +----------+-------+-------+--------+---------------------------------+--------+ ICA Prox  287    87     60-79%  heterogenous, irregular and                                               calcific                                   +----------+-------+-------+--------+---------------------------------+--------+  ICA Mid   251    68                                                       +----------+-------+-------+--------+---------------------------------+--------+ ICA Distal154    53                                                       +----------+-------+-------+--------+---------------------------------+--------+ ECA       177                                                             +----------+-------+-------+--------+---------------------------------+--------+ Portions of this table do not appear on this page. +----------+--------+-------+--------+------------+           PSV cm/sEDV cmsDescribeArm Pressure +----------+--------+-------+--------+------------+ Subclavian65                                  +----------+--------+-------+--------+------------+ +---------+--------+--+--------+-+---------+ VertebralPSV cm/s19EDV cm/s7Antegrade +---------+--------+--+--------+-+---------+ Left Carotid Findings: +----------+--------+--------+--------+-------------------+--------------------+           PSV cm/sEDV cm/sStenosisDescribe           Comments             +----------+--------+--------+--------+-------------------+--------------------+ CCA Prox  100     17              heterogenous                            +----------+--------+--------+--------+-------------------+--------------------+ CCA Distal68      13              calcific and                                                              irregular                               +----------+--------+--------+--------+-------------------+--------------------+ ICA Prox  227     31      1-39%   heterogenous and   Velocities may                                         calcific           underestimate degree                                                      of  stenosis  due to                                                        more proximal                                                             obstruction.         +----------+--------+--------+--------+-------------------+--------------------+ ICA Distal152     39                                                      +----------+--------+--------+--------+-------------------+--------------------+ ECA       170                                                             +----------+--------+--------+--------+-------------------+--------------------+ +----------+--------+--------+--------+------------+ SubclavianPSV cm/sEDV cm/sDescribeArm Pressure +----------+--------+--------+--------+------------+           55                                   +----------+--------+--------+--------+------------+ +---------+--------+--+--------+--+---------+ VertebralPSV cm/s45EDV cm/s15Antegrade +---------+--------+--+--------+--+---------+  ABI Findings: +--------+------------------+-----+---------+--------------------+ Right   Rt Pressure (mmHg)IndexWaveform Comment              +--------+------------------+-----+---------+--------------------+ Brachial                       biphasic restricted extremity +--------+------------------+-----+---------+--------------------+ PTA     196               2.84 triphasic                     +--------+------------------+-----+---------+--------------------+ DP      189               2.74 triphasic                     +--------+------------------+-----+---------+--------------------+ +--------+------------------+-----+---------+-------+ Left    Lt Pressure (mmHg)IndexWaveform Comment +--------+------------------+-----+---------+-------+ Brachial69                     biphasic         +--------+------------------+-----+---------+-------+ PTA     200               2.90 triphasic         +--------+------------------+-----+---------+-------+ DP      193               2.80 triphasic        +--------+------------------+-----+---------+-------+  Right Doppler Findings: +--------+--------+-----+--------+--------------------+ Site    PressureIndexDoppler Comments             +--------+--------+-----+--------+--------------------+ Brachial             biphasicrestricted extremity +--------+--------+-----+--------+--------------------+ Radial  biphasic                     +--------+--------+-----+--------+--------------------+ Ulnar                biphasic                     +--------+--------+-----+--------+--------------------+  Left Doppler Findings: +--------+--------+-----+--------+--------+ Site    PressureIndexDoppler Comments +--------+--------+-----+--------+--------+ [id number]           biphasic         +--------+--------+-----+--------+--------+ Radial               biphasic         +--------+--------+-----+--------+--------+ Ulnar                biphasic         +--------+--------+-----+--------+--------+  Summary: Right Carotid: Velocities 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 Left Carotid: Velocities in the left ICA are consistent with a 1-39% stenosis. Vertebrals: Bilateral vertebral arteries demonstrate antegrade flow. Right ABI: Resting right ankle-brachial index indicates noncompressible right lower extremity arteries. Left ABI: Resting left ankle-brachial index indicates noncompressible left lower extremity arteries. Right Upper Extremity: Doppler waveforms decrease 50% with right radial compression. Doppler waveforms decrease 50% with right ulnar compression. Left Upper Extremity: Doppler waveforms remain within normal limits with left radial compression. Doppler waveforms remain within normal limits with left ulnar compression.    Preliminary      Scheduled Meds: . aspirin  81 mg  Oral Daily  . aspirin EC  81 mg Oral Daily  . atorvastatin  80 mg Oral Daily  . carvedilol  6.25 mg Oral BID WC  . hydrALAZINE  25 mg Oral TID  . hydrocerin   Topical Daily  . insulin aspart  0-15 Units Subcutaneous TID WC  . isosorbide mononitrate  30 mg Oral Daily   Continuous Infusions: . heparin 1,600 Units/hr (03/31/20 1955)     LOS: 5 days   Time Spent in minutes   45 minutes  Cecil Bixby D.O. on 04/01/2020 at 11:25 AM  Between 7am to 7pm - Please see pager noted on [email or web address]  After 7pm go to [email or web address]  And look for the night coverage person covering for me after hours  [facility] Office  [phone]

## 2020-04-01 NOTE — Progress Notes (Signed)
Discussed IS, sternal precautions, mobility post op, and d/c planning. Pt receptive, appropriate questions. 1500 mL on IS. Gave materials to review. Pt declined ambulation right now, walking around room. His significant other will be with him at d/c. Can walk hall later. [phone] Curtis Watkins CES, ACSM 3:12 PM 04/01/2020

## 2020-04-01 NOTE — Progress Notes (Signed)
Pre cabg has been completed.   Preliminary results in CV Proc.   Blanch Media 04/01/2020 9:31 AM

## 2020-04-01 NOTE — Progress Notes (Addendum)
Progress Note  Patient Name: Curtis Watkins Date of Encounter: 04/01/2020  [facility] Cardiologist: Tobias AlexanderKatarina Nelson, MD   Subjective   Eating lunch. No complaints this morning. Denies chest pain, SOB, LE edema, or palpitations.  Inpatient Medications    Scheduled Meds: . aspirin  81 mg Oral Daily  . aspirin EC  81 mg Oral Daily  . atorvastatin  80 mg Oral Daily  . carvedilol  12.5 mg Oral BID WC  . hydrALAZINE  25 mg Oral TID  . hydrocerin   Topical Daily  . insulin aspart  0-15 Units Subcutaneous TID WC  . isosorbide mononitrate  30 mg Oral Daily   Continuous Infusions: . sodium chloride    . heparin 1,600 Units/hr (03/31/20 1955)   PRN Meds: acetaminophen, ondansetron (ZOFRAN) IV   Vital Signs    Vitals:   03/31/20 1700 03/31/20 1954 04/01/20 0249 04/01/20 0547  BP: (!) 175/58 (!) 174/64 (!) 164/54 (!) 165/64  Pulse: 71 75 70 68  Resp:  16 16 17   Temp:  98.2 F (36.8 C) 98.2 F (36.8 C) 98 F (36.7 C)  TempSrc:  Oral Oral Oral  SpO2:  100% 100% 100%  Weight:   75.3 kg   Height:        Intake/Output Summary (Last 24 hours) at 04/01/2020 1147 Last data filed at 04/01/2020 0954 Gross per 24 hour  Intake 1070.19 ml  Output 1550 ml  Net -479.81 ml   Last 3 Weights 04/01/2020 03/31/2020 03/30/2020  Weight (lbs) 166 lb 1.6 oz 162 lb 4.8 oz 163 lb 9.6 oz  Weight (kg) 75.342 kg 73.619 kg 74.208 kg      Telemetry    NSR - Personally Reviewed  ECG    No new tracings - Personally Reviewed  Physical Exam   GEN: Sitting on the edge of his bed eating lunch in no acute distress.   Neck: No JVD Cardiac: RRR, no murmurs, rubs, or gallops.  Respiratory: Clear to auscultation bilaterally. GI: Soft, nontender, non-distended  MS: No edema; No deformity. Neuro:  Nonfocal  Psych: Normal affect   Labs    High Sensitivity Troponin:   Recent Labs  Lab 03/27/20 2337 03/28/20 0146 03/28/20 1915  TROPONINIHS 353* 337* 309*      Chemistry Recent Labs  Lab  03/30/20 0249 03/31/20 0440 04/01/20 0219  NA 137 137 137  K 3.9 4.1 4.2  CL 103 104 107  CO2 24 23 21*  GLUCOSE 149* 126* 212*  BUN 30* 28* 29*  CREATININE 1.58* 1.59* 1.71*  CALCIUM 8.2* 8.5* 8.1*  GFRNONAA 49* 48* 44*  ANIONGAP 10 10 9      Hematology Recent Labs  Lab 03/30/20 0249 03/30/20 1112 03/31/20 0440 04/01/20 0219  WBC 6.4  --  5.8 5.3  RBC 3.06* 3.45* 3.15* 2.95*  HGB 10.0*  --  10.2* 9.4*  HCT 28.7*  --  29.4* 28.4*  MCV 93.8  --  93.3 96.3  MCH 32.7  --  32.4 31.9  MCHC 34.8  --  34.7 33.1  RDW 12.6  --  12.6 12.4  PLT 239  --  255 212    BNP Recent Labs  Lab 03/27/20 1811  BNP 1,383.1*     DDimer No results for input(s): DDIMER in the last 168 hours.   Radiology    VAS US DOPPLER PRE CABG  Result Date: 04/01/2020 PREOPERATIVE VASCULAR EVALUATION  Indications:      Pre-CABG. Risk Factors:  Hypertension, Diabetes. Comparison Study: no prior Performing Technologist: Blanch Media RVS  Examination Guidelines: A complete evaluation includes B-mode imaging, spectral Doppler, color Doppler, and power Doppler as needed of all accessible portions of each vessel. Bilateral testing is considered an integral part of a complete examination. Limited examinations for reoccurring indications may be performed as noted.  Right Carotid Findings: +----------+-------+-------+--------+---------------------------------+--------+           PSV    EDV    StenosisDescribe                         Comments           cm/s   cm/s                                                     +----------+-------+-------+--------+---------------------------------+--------+ CCA Prox  104    21             heterogenous                              +----------+-------+-------+--------+---------------------------------+--------+ CCA Distal88     13             heterogenous                               +----------+-------+-------+--------+---------------------------------+--------+ ICA Prox  287    87     60-79%  heterogenous, irregular and                                               calcific                                  +----------+-------+-------+--------+---------------------------------+--------+ ICA Mid   251    68                                                       +----------+-------+-------+--------+---------------------------------+--------+ ICA Distal154    53                                                       +----------+-------+-------+--------+---------------------------------+--------+ ECA       177                                                             +----------+-------+-------+--------+---------------------------------+--------+ Portions of this table do not appear on this page. +----------+--------+-------+--------+------------+           PSV cm/sEDV cmsDescribeArm Pressure +----------+--------+-------+--------+------------+ [id number]                                  +----------+--------+-------+--------+------------+ +---------+--------+--+--------+-+---------+  VertebralPSV cm/s19EDV cm/s7Antegrade +---------+--------+--+--------+-+---------+ Left Carotid Findings: +----------+--------+--------+--------+-------------------+--------------------+           PSV cm/sEDV cm/sStenosisDescribe           Comments             +----------+--------+--------+--------+-------------------+--------------------+ CCA Prox  100     17              heterogenous                            +----------+--------+--------+--------+-------------------+--------------------+ CCA Distal68      13              calcific and                                                              irregular                               +----------+--------+--------+--------+-------------------+--------------------+ ICA Prox  227      31      1-39%   heterogenous and   Velocities may                                         calcific           underestimate degree                                                      of stenosis due to                                                        more proximal                                                             obstruction.         +----------+--------+--------+--------+-------------------+--------------------+ ICA Distal152     39                                                      +----------+--------+--------+--------+-------------------+--------------------+ ECA       170                                                             +----------+--------+--------+--------+-------------------+--------------------+ +----------+--------+--------+--------+------------+  SubclavianPSV cm/sEDV cm/sDescribeArm Pressure +----------+--------+--------+--------+------------+           55                                   +----------+--------+--------+--------+------------+ +---------+--------+--+--------+--+---------+ VertebralPSV cm/s45EDV cm/s15Antegrade +---------+--------+--+--------+--+---------+  ABI Findings: +--------+------------------+-----+---------+--------------------+ Right   Rt Pressure (mmHg)IndexWaveform Comment              +--------+------------------+-----+---------+--------------------+ Brachial                       biphasic restricted extremity +--------+------------------+-----+---------+--------------------+ PTA     196               2.84 triphasic                     +--------+------------------+-----+---------+--------------------+ DP      189               2.74 triphasic                     +--------+------------------+-----+---------+--------------------+ +--------+------------------+-----+---------+-------+ Left    Lt Pressure (mmHg)IndexWaveform Comment  +--------+------------------+-----+---------+-------+ Brachial69                     biphasic         +--------+------------------+-----+---------+-------+ PTA     200               2.90 triphasic        +--------+------------------+-----+---------+-------+ DP      193               2.80 triphasic        +--------+------------------+-----+---------+-------+  Right Doppler Findings: +--------+--------+-----+--------+--------------------+ Site    PressureIndexDoppler Comments             +--------+--------+-----+--------+--------------------+ Brachial             biphasicrestricted extremity +--------+--------+-----+--------+--------------------+ Radial               biphasic                     +--------+--------+-----+--------+--------------------+ Ulnar                biphasic                     +--------+--------+-----+--------+--------------------+  Left Doppler Findings: +--------+--------+-----+--------+--------+ Site    PressureIndexDoppler Comments +--------+--------+-----+--------+--------+ Loreli Dollar           biphasic         +--------+--------+-----+--------+--------+ Radial               biphasic         +--------+--------+-----+--------+--------+ Ulnar                biphasic         +--------+--------+-----+--------+--------+  Summary: Right Carotid: Velocities 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 Left Carotid: Velocities in the left ICA are consistent with a 1-39% stenosis. Vertebrals: Bilateral vertebral arteries demonstrate antegrade flow. Right ABI: Resting right ankle-brachial index indicates noncompressible right lower extremity arteries. Left ABI: Resting left ankle-brachial index indicates noncompressible left lower extremity arteries. Right Upper Extremity: Doppler waveforms decrease 50% with right radial compression. Doppler waveforms decrease 50% with right ulnar compression. Left Upper  Extremity: Doppler waveforms remain within normal limits  with left radial compression. Doppler waveforms remain within normal limits with left ulnar compression.    Preliminary     Cardiac Studies   TTE: 03/28/2020 LVEF: 40 to 45%. There is hypokinesis in the apical inferior, septal and anterior walls. Mild LVH. Grade 2 DD, Normal RV size and diastolic function. LA mildly dilated, mild MR.   Cardiac catheterization: 03/29/2020   Diabetic with severe two-vessel disease including the LAD and complex disease in the circumflex.  Ischemic cardiomyopathy with LVEF 35 to 40%. LVEDP is less than 10 mmHg.  Left main is widely patent  Right coronary is widely patent but contains diffuse atherosclerosis without focal narrowing.  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  RECOMMENDATIONS:   Continue IV heparin  Continue to treat heart failure  Watch kidney function  T CTS evaluation to consider coronary artery bypass grafting with LIMA to the LAD. SVG to circumflex or arterial graft assuming that the left radial has flow..   Patient Profile     65 y.o. male with a hx of HTN and DMwho is being followed by cardiology for the evaluation ofCHF and elevated troponin.  Assessment & Plan    1. NSTEMI: HsTrop peaked at 353. EKG non-ischemic. He was taken to the cath lab where he was found to have severe 2 vessel CAD including LAD and complex disease in the LCx with associated ischemic cardiomyopathy with EF 35-40%. Given history of DM and complex lesions, he was recommended for TCTS evaluation. Tentatively planning for CABG 04/03/20. He remains on a heparin gtt.  - Continue aspirin and statin - Continue heparin gtt - Continue imdur and BBlocker  2. Ischemic cardiomyopathy/acute combined CHF: Echo with EF 40-45% 03/28/20, though 35-40% on [facility] 03/29/20. LVEDP was <10 mmHg at the time of cath. Initially diuresed with IV lasix 40mg  daily, held 03/30/20 due to slight rise in Cr. Lisinopril  initiated this admission is on hold.  - Continue carvedilol, hydralazine, and imdur - Can consider addition of ARB if Cr normalizes - Continue to monitor strict I&Os and daily weights  3. HTN: BP remains persistently elevated despite increase in carvedilol to 6.25mg  BID yesterday  - Will increase carvedilol to 12.5 mg BID - Continue hydralazine though suspect this will be uptitrated as well if BP remains above goal   4. HLD: LDL 65 this admission; goal <70. Started on atorvastatin 80mg  daily - Continue statin - Will need repeat FLP/LFTs in 6-8 weeks for close monitoring  5. DM type 2:  A1C 6.8 this admission; goal <7 - Continue management per primary team - Consider addition of SGLT2-inhibitor prior to discharge.   6. AoCKD stage 3: Cr 1.3 on admission - suspect this is baseline, though now is up to 1.7 today despite avoidance of lasix and lisinopril since 03/30/20. Receiving gentle IV fluids now.  - Continue to monitor closely and avoid nephrotoxic agents    For questions or updates, please contact [facility] Please consult [email or web address] for contact info under      Signed, , PA-C  04/01/2020, 11:47 AM    History and all data above reviewed.  Patient examined.  I agree with the findings as above. The patient denies any chest pain.  No SOB.  He has been ambulating in the hallway.  The patient exam reveals COR:RRR  ,  Lungs: Clear  ,  Abd: , Ext:  No edema.    All available labs, radiology testing, previous records reviewed. Agree with documented assessment and  plan. CAD:  Plan for CABG on Wed.  HTN:  Increase beta blocker as above.  DM:  Likely start Jardiance before discharge.    Macdonald Rigor  2:00 PM  04/01/2020

## 2020-04-01 NOTE — Progress Notes (Signed)
Patient denies chest pain or shortness of breath over the past 24 hours. Patient is scheduled for CABG with Dr. Vickey Sages on Wednesday 04/03/2020.

## 2020-04-01 NOTE — Progress Notes (Signed)
ANTICOAGULATION CONSULT NOTE   Pharmacy Consult for heparin Indication: chest pain/ACS   Labs: Recent Labs    03/30/20 0249 03/30/20 1037 03/30/20 1848 03/31/20 0440 04/01/20 0219  HGB 10.0*  --   --  10.2* 9.4*  HCT 28.7*  --   --  29.4* 28.4*  PLT 239  --   --  255 212  HEPARINUNFRC <0.10*   < > 0.34 0.51 0.65  CREATININE 1.58*  --   --  1.59* 1.71*   < > = values in this interval not displayed.    Assessment: 65 yo M presents w/ concern for NSTEMI. Heparin restarted post cath for severe 2V CAD. Plans for CABG on 2/9 -heparin level at goal  Goal of Therapy:  Heparin level 0.3-0.7 units/ml Monitor platelets by anticoagulation protocol: Yes   Plan:  Continue IV heparin at 1,600 units / hr Follow daily HL, monitor CBC  Thanks for allowing pharmacy to be a part of this patient's care.  Harland German, PharmD Clinical Pharmacist **Pharmacist phone directory can now be found on [email or web address] ([email or web address]).  Listed under [facility].

## 2020-04-02 LAB — BLOOD GAS, ARTERIAL
Acid-base deficit: 2.1 mmol/L — ABNORMAL HIGH (ref 0.0–2.0)
Bicarbonate: 22 mmol/L (ref 20.0–28.0)
Drawn by: 36277
FIO2: 21
O2 Saturation: 97.7 %
Patient temperature: 37
pCO2 arterial: 36.1 mmHg (ref 32.0–48.0)
pH, Arterial: 7.402 (ref 7.350–7.450)
pO2, Arterial: 100 mmHg (ref 83.0–108.0)

## 2020-04-02 LAB — GLUCOSE, CAPILLARY
Glucose-Capillary: 121 mg/dL — ABNORMAL HIGH (ref 70–99)
Glucose-Capillary: 157 mg/dL — ABNORMAL HIGH (ref 70–99)
Glucose-Capillary: 197 mg/dL — ABNORMAL HIGH (ref 70–99)
Glucose-Capillary: 230 mg/dL — ABNORMAL HIGH (ref 70–99)

## 2020-04-02 LAB — HEPARIN LEVEL (UNFRACTIONATED): Heparin Unfractionated: 0.58 IU/mL (ref 0.30–0.70)

## 2020-04-02 LAB — CBC
HCT: 25.2 % — ABNORMAL LOW (ref 39.0–52.0)
Hemoglobin: 8.7 g/dL — ABNORMAL LOW (ref 13.0–17.0)
MCH: 32.6 pg (ref 26.0–34.0)
MCHC: 34.5 g/dL (ref 30.0–36.0)
MCV: 94.4 fL (ref 80.0–100.0)
Platelets: 187 10*3/uL (ref 150–400)
RBC: 2.67 MIL/uL — ABNORMAL LOW (ref 4.22–5.81)
RDW: 12.7 % (ref 11.5–15.5)
WBC: 4.8 10*3/uL (ref 4.0–10.5)
nRBC: 0 % (ref 0.0–0.2)

## 2020-04-02 LAB — URINALYSIS, ROUTINE W REFLEX MICROSCOPIC
Bilirubin Urine: NEGATIVE
Glucose, UA: NEGATIVE mg/dL
Ketones, ur: NEGATIVE mg/dL
Leukocytes,Ua: NEGATIVE
Nitrite: NEGATIVE
Protein, ur: 100 mg/dL — AB
Specific Gravity, Urine: 1.015 (ref 1.005–1.030)
pH: 6 (ref 5.0–8.0)

## 2020-04-02 LAB — SURGICAL PCR SCREEN
MRSA, PCR: NEGATIVE
Staphylococcus aureus: NEGATIVE

## 2020-04-02 LAB — URINALYSIS, MICROSCOPIC (REFLEX)
Squamous Epithelial / HPF: NONE SEEN (ref 0–5)
WBC, UA: NONE SEEN WBC/hpf (ref 0–5)

## 2020-04-02 LAB — BASIC METABOLIC PANEL
Anion gap: 7 (ref 5–15)
BUN: 26 mg/dL — ABNORMAL HIGH (ref 8–23)
CO2: 22 mmol/L (ref 22–32)
Calcium: 8 mg/dL — ABNORMAL LOW (ref 8.9–10.3)
Chloride: 109 mmol/L (ref 98–111)
Creatinine, Ser: 1.37 mg/dL — ABNORMAL HIGH (ref 0.61–1.24)
GFR, Estimated: 58 mL/min — ABNORMAL LOW (ref >60)
Glucose, Bld: 136 mg/dL — ABNORMAL HIGH (ref 70–99)
Potassium: 3.9 mmol/L (ref 3.5–5.1)
Sodium: 138 mmol/L (ref 135–145)

## 2020-04-02 LAB — ABO/RH: ABO/RH(D): O POS

## 2020-04-02 MED ORDER — CHLORHEXIDINE GLUCONATE CLOTH 2 % EX PADS
6.0000 | MEDICATED_PAD | Freq: Once | CUTANEOUS | Status: AC
  Administered 2020-04-03: 6 via TOPICAL

## 2020-04-02 MED ORDER — INSULIN REGULAR(HUMAN) IN NACL 100-0.9 UT/100ML-% IV SOLN
INTRAVENOUS | Status: AC
  Administered 2020-04-03: 2 [IU]/h via INTRAVENOUS
  Filled 2020-04-02 (×2): qty 100

## 2020-04-02 MED ORDER — SODIUM CHLORIDE 0.9 % IV SOLN
1.5000 g | INTRAVENOUS | Status: AC
  Administered 2020-04-03: 1.5 g via INTRAVENOUS
  Filled 2020-04-02: qty 1.5

## 2020-04-02 MED ORDER — DEXMEDETOMIDINE HCL IN NACL 400 MCG/100ML IV SOLN
0.1000 ug/kg/h | INTRAVENOUS | Status: AC
  Administered 2020-04-03: .3 ug/kg/h via INTRAVENOUS
  Filled 2020-04-02: qty 100

## 2020-04-02 MED ORDER — PHENYLEPHRINE HCL-NACL 20-0.9 MG/250ML-% IV SOLN
30.0000 ug/min | INTRAVENOUS | Status: AC
  Administered 2020-04-03: 20 ug/min via INTRAVENOUS
  Filled 2020-04-02: qty 250

## 2020-04-02 MED ORDER — NITROGLYCERIN IN D5W 200-5 MCG/ML-% IV SOLN
2.0000 ug/min | INTRAVENOUS | Status: AC
  Administered 2020-04-03: 5 ug/min via INTRAVENOUS
  Filled 2020-04-02: qty 250

## 2020-04-02 MED ORDER — MILRINONE LACTATE IN DEXTROSE 20-5 MG/100ML-% IV SOLN
0.3000 ug/kg/min | INTRAVENOUS | Status: AC
  Administered 2020-04-03: .25 ug/kg/min via INTRAVENOUS
  Filled 2020-04-02: qty 100

## 2020-04-02 MED ORDER — BISACODYL 5 MG PO TBEC
5.0000 mg | DELAYED_RELEASE_TABLET | Freq: Once | ORAL | Status: AC
  Administered 2020-04-02: 5 mg via ORAL
  Filled 2020-04-02: qty 1

## 2020-04-02 MED ORDER — SODIUM CHLORIDE 0.9 % IV SOLN
750.0000 mg | INTRAVENOUS | Status: AC
  Administered 2020-04-03: 750 mg via INTRAVENOUS
  Filled 2020-04-02: qty 750

## 2020-04-02 MED ORDER — TRANEXAMIC ACID (OHS) BOLUS VIA INFUSION
15.0000 mg/kg | INTRAVENOUS | Status: AC
  Administered 2020-04-03: 1156.5 mg via INTRAVENOUS
  Filled 2020-04-02: qty 1157

## 2020-04-02 MED ORDER — POTASSIUM CHLORIDE 2 MEQ/ML IV SOLN
80.0000 meq | INTRAVENOUS | Status: DC
  Filled 2020-04-02: qty 40

## 2020-04-02 MED ORDER — NOREPINEPHRINE 4 MG/250ML-% IV SOLN
0.0000 ug/min | INTRAVENOUS | Status: AC
  Administered 2020-04-03: 3 ug/min via INTRAVENOUS
  Filled 2020-04-02: qty 250

## 2020-04-02 MED ORDER — SODIUM CHLORIDE 0.9 % IV SOLN
INTRAVENOUS | Status: DC
  Filled 2020-04-02: qty 30

## 2020-04-02 MED ORDER — TRANEXAMIC ACID (OHS) PUMP PRIME SOLUTION
2.0000 mg/kg | INTRAVENOUS | Status: DC
  Filled 2020-04-02: qty 1.54

## 2020-04-02 MED ORDER — VANCOMYCIN HCL 1250 MG/250ML IV SOLN
1250.0000 mg | INTRAVENOUS | Status: AC
  Administered 2020-04-03: 1250 mg via INTRAVENOUS
  Filled 2020-04-02: qty 250

## 2020-04-02 MED ORDER — CHLORHEXIDINE GLUCONATE 0.12 % MT SOLN
15.0000 mL | Freq: Once | OROMUCOSAL | Status: AC
  Administered 2020-04-03: 15 mL via OROMUCOSAL
  Filled 2020-04-02: qty 15

## 2020-04-02 MED ORDER — HYDRALAZINE HCL 50 MG PO TABS
50.0000 mg | ORAL_TABLET | Freq: Three times a day (TID) | ORAL | Status: DC
  Administered 2020-04-02 (×2): 50 mg via ORAL
  Filled 2020-04-02 (×2): qty 1

## 2020-04-02 MED ORDER — EPINEPHRINE HCL 5 MG/250ML IV SOLN IN NS
0.0000 ug/min | INTRAVENOUS | Status: DC
  Filled 2020-04-02: qty 250

## 2020-04-02 MED ORDER — PLASMA-LYTE 148 IV SOLN
INTRAVENOUS | Status: DC
  Filled 2020-04-02: qty 2.5

## 2020-04-02 MED ORDER — TRANEXAMIC ACID 1000 MG/10ML IV SOLN
1.5000 mg/kg/h | INTRAVENOUS | Status: AC
  Administered 2020-04-03: 1.5 mg/kg/h via INTRAVENOUS
  Filled 2020-04-02: qty 25

## 2020-04-02 MED ORDER — TEMAZEPAM 15 MG PO CAPS: 15.0000 mg | ORAL_CAPSULE | Freq: Once | ORAL | Status: DC | PRN

## 2020-04-02 MED ORDER — MAGNESIUM SULFATE 50 % IJ SOLN
40.0000 meq | INTRAMUSCULAR | Status: DC
  Filled 2020-04-02: qty 9.85

## 2020-04-02 NOTE — Anesthesia Preprocedure Evaluation (Signed)
Anesthesia Evaluation  Patient identified by MRN, date of birth, ID band Patient awake    Reviewed: Allergy & Precautions, NPO status , Patient's Chart, lab work & pertinent test results, reviewed documented beta blocker date and time   Airway Mallampati: II  TM Distance: >3 FB Neck ROM: Full    Dental  (+) Dental Advisory Given, Edentulous Upper   Pulmonary Current Smoker and Patient abstained from smoking.,    Pulmonary exam normal breath sounds clear to auscultation       Cardiovascular hypertension, Pt. on medications + angina + CAD and +CHF  Normal cardiovascular exam Rhythm:Regular Rate:Normal  Echo 03/28/20: IMPRESSIONS    1. Left ventricular ejection fraction, by estimation, is 40 to 45%. The  left ventricle has mildly decreased function. There is mild left  ventricular hypertrophy. Left ventricular diastolic parameters are  consistent with Grade II diastolic dysfunction  (pseudonormalization). Elevated left atrial pressure.  2. Right ventricular systolic function is normal. The right ventricular  size is normal.  3. Left atrial size was mildly dilated.  4. Mild mitral valve regurgitation.  5. The inferior vena cava is dilated in size with <50% respiratory  variability, suggesting right atrial pressure of 15 mmHg.    Neuro/Psych negative neurological ROS     GI/Hepatic negative GI ROS, (+)     substance abuse  marijuana use,   Endo/Other  diabetes, Type 2, Oral Hypoglycemic Agents  Renal/GU negative Renal ROS     Musculoskeletal negative musculoskeletal ROS (+)   Abdominal   Peds  Hematology  (+) Blood dyscrasia, anemia ,   Anesthesia Other Findings   Reproductive/Obstetrics                           Anesthesia Physical Anesthesia Plan  ASA: IV  Anesthesia Plan: General   Post-op Pain Management:    Induction: Intravenous  PONV Risk Score and Plan: 1 and  Treatment may vary due to age or medical condition and Midazolam  Airway Management Planned: Oral ETT  Additional Equipment: Arterial line, CVP, PA Cath, TEE and Ultrasound Guidance Line Placement  Intra-op Plan:   Post-operative Plan: Post-operative intubation/ventilation  Informed Consent: I have reviewed the patients History and Physical, chart, labs and discussed the procedure including the risks, benefits and alternatives for the proposed anesthesia with the patient or authorized representative who has indicated his/her understanding and acceptance.     Dental advisory given  Plan Discussed with: CRNA  Anesthesia Plan Comments:        Anesthesia Quick Evaluation

## 2020-04-02 NOTE — Progress Notes (Signed)
PROGRESS NOTE    Curtis Watkins  ZOX:[id number] DOB: August 24, 1955 DOA: 03/27/2020 PCP: Doran Stabler, NP   Brief Narrative:  HPI On 03/27/20 by Dr. Lyda Perone Curtis Watkins is a 65 y.o. male with medical history significant of DM2, HTN.  Pt sent to ED from [facility] for new onset of BLE edema and SOB.  Symptoms onset over the past few days.  He had recently stopped taking HCTZ after noting his BP was low at home.  Since that time he has had progressive BLE edema, DOE, orthopnea.  Pt is COVID vaccinated and boosted.  Has had wounds on BLE feet over course of past couple of weeks.  No CP, no abd pain.  Interim history Patient presented with volume overload, found to have a BNP of 1383.  Echocardiogram showing CHF.  Catheterization showed severe two-vessel disease, cardiothoracic surgery consulted and pending surgery on 04/03/20.  Also noted to have osteo of the right toes, ortho consulted, recommended outpatient follow-up. Assessment & Plan   New onset combined systolic and diastolic CHF -Patient presented with lower extremity swelling and orthopnea along with fatigue -BNP on admission 1383 -Echocardiogram showed an EF of 40 to 45%, grade 2 diastolic dysfunction.  Mild LVH. -Was placed on IV Lasix 40 mg twice daily however cardiology has discontinued this. -Monitor intake and output, daily weights -Cardiology consulted and appreciated -Continue Coreg, hydralazine and Imdur.  NSTEMI -High-sensitivity troponin 353- 337 -Cardiology consulted and appreciated -Patient started on heparin -Lipid panel: Total cholesterol 119, HDL 42, LDL 65, triglycerides 59, VLDL 12 -Status post left heart catheterization showing severe two-vessel disease including the LAD and complex disease circumflex.  EF of 35 to 40%.  Recommendations: Continue IV heparin, treat heart failure, cardiothoracic surgery consulted- surgery planned for 04/03/20 -Lasix discontinued.  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, stage II -No previous labs for comparison -Creatinine peaked to 1.71 -Placed on gentle IV fluids, creatinine mildly improved down to 1.31 -Continue to monitor BMP  Essential hypertension -Placed on Coreg, hydralazine, Imdur  Diabetes mellitus, type II -Metformin held -Continue insulin sliding scale CBG monitor -Hemoglobin A1c 6.8  Normocytic Anemia -Hemoglobin currently 8.7 -Anemia panel showed iron 94, ferritin 249, saturation ratio 33 -Have ordered FOBT -Continue to monitor CBC  DVT Prophylaxis  Heparin  Code Status: Full  Family Communication: None at bedside  Disposition Plan:  Status is: Inpatient  Remains inpatient appropriate because:Inpatient level of care appropriate due to severity of illness   Dispo: The patient is from: Home              Anticipated d/c is to: Home              Anticipated d/c date is: > 3 days              Patient currently is not medically stable to d/c.   Difficult to place patient No   Consultants Cardiology Orthopedics Cardiothoracic surgery  Procedures  Echocardiogram Cardiac cath  Antibiotics   Anti-infectives (From admission, onward)   Start     Dose/Rate Route Frequency Ordered Stop   04/03/20 0400  vancomycin (VANCOREADY) IVPB 1250 mg/250 mL        1,250 mg 166.7 mL/hr over 90 Minutes Intravenous  To Surgery 04/02/20 0747 04/04/20 0400   04/03/20 0400  cefUROXime (ZINACEF) 1.5 g in sodium chloride 0.9 % 100 mL IVPB        1.5 g 200 mL/hr over 30 Minutes Intravenous To Surgery 04/02/20 0747 04/04/20 0400   04/03/20 0400  cefUROXime (ZINACEF) 750 mg in sodium chloride 0.9 % 100 mL IVPB        750 mg 200 mL/hr over 30  Minutes Intravenous To Surgery 04/02/20 0747 04/04/20 0400      Subjective:   Curtis Watkins seen and examined today.  Feels breathing has improved as well as his lower extremity swelling.  Mildly anxious about his surgery tomorrow.  Currently denies chest pain or shortness of breath, abdominal pain, nausea or vomiting, diarrhea or constipation, dizziness or headache.    Objective:   Vitals:   04/01/20 2014 04/01/20 2017 04/02/20 0348 04/02/20 0856  BP: (!) 159/55 (!) 159/55 (!) 168/77 (!) 174/61  Pulse: 75  70 72  Resp: 20  15 16   Temp: 98.2 F (36.8 C)  98.2 F (36.8 C) 98.3 F (36.8 C)  TempSrc: Oral  Oral Oral  SpO2: 100%  100% 100%  Weight:   77.1 kg   Height:        Intake/Output Summary (Last 24 hours) at 04/02/2020 1111 Last data filed at 04/02/2020 0844 Gross per 24 hour  Intake 2055.64 ml  Output 1550 ml  Net 505.64 ml   Filed Weights   03/31/20 0429 04/01/20 0249 04/02/20 0348  Weight: 73.6 kg 75.3 kg 77.1 kg   Exam  General: Well developed, chronically ill-appearing, NAD  HEENT: NCAT, mucous membranes moist.   Cardiovascular: S1 S2 auscultated, no rubs, murmurs or gallops. Regular rate and rhythm.  Respiratory: Diminished breath sounds however clear, no wheezing  Abdomen: Soft, nontender, nondistended, + bowel sounds  Extremities: warm dry without cyanosis clubbing.  Trace LE edema, greatly improved.  Bilateral foot ulcers.  Neuro: AAOx3, nonfocal   Psych: pleasant, appropriate mood and affect   Data Reviewed: I have personally reviewed following labs and imaging studies  CBC: Recent Labs  Lab 03/29/20 0600 03/30/20 0249 03/31/20 0440 04/01/20 0219 04/02/20 0321  WBC 6.1 6.4 5.8 5.3 4.8  HGB 11.2* 10.0* 10.2* 9.4* 8.7*  HCT 31.0* 28.7* 29.4* 28.4* 25.2*  MCV 91.4 93.8 93.3 96.3 94.4  PLT 266 239 255 212 187   Basic Metabolic Panel: Recent Labs  Lab 03/29/20 0600 03/30/20 0249 03/31/20 0440 04/01/20 0219 04/02/20 0321  NA 137 137 137  137 138  K 3.8 3.9 4.1 4.2 3.9  CL 101 103 104 107 109  CO2 24 24 23  21* 22  GLUCOSE 129* 149* 126* 212* 136*  BUN 23 30* 28* 29* 26*  CREATININE 1.38* 1.58* 1.59* 1.71* 1.37*  CALCIUM 8.8* 8.2* 8.5* 8.1* 8.0*   GFR: Estimated Creatinine Clearance: 58 mL/min (A) (by C-G formula based on SCr of 1.37 mg/dL (H)). Liver Function Tests: No results for input(s): AST, ALT, ALKPHOS, BILITOT, PROT, ALBUMIN in the last 168 hours. No results for input(s): LIPASE, AMYLASE in the last 168 hours. No results for input(s): AMMONIA in the last 168 hours. Coagulation Profile: No results for input(s): INR, PROTIME in the last 168 hours. Cardiac Enzymes: No results for input(s): CKTOTAL, CKMB, CKMBINDEX, TROPONINI in the last 168 hours. BNP (last 3 results) No results for input(s): PROBNP in the last 8760 hours. HbA1C: No results for input(s): HGBA1C in the last 72 hours. CBG: Recent Labs  Lab 04/01/20 0750 04/01/20 1157 04/01/20 1625 04/01/20 2141 04/02/20 0557  GLUCAP 111* 157* 171* 235* 121*   Lipid Profile: No results for input(s): CHOL, HDL, LDLCALC, TRIG, CHOLHDL, LDLDIRECT in the last 72 hours. Thyroid Function Tests: No results for input(s): TSH, T4TOTAL, FREET4, T3FREE, THYROIDAB in the last 72 hours. Anemia Panel: Recent Labs    03/30/20 1112 03/30/20 1121  VITAMINB12  --  182  FOLATE  --  11.1  FERRITIN  --  249  TIBC  --  281  IRON  --  94  RETICCTPCT 1.8  --    Urine analysis:    Component Value Date/Time   COLORURINE YELLOW 04/02/2020 0501   APPEARANCEUR CLEAR 04/02/2020 0501   LABSPEC 1.015 04/02/2020 0501   PHURINE 6.0 04/02/2020 0501   GLUCOSEU NEGATIVE 04/02/2020 0501   HGBUR MODERATE (A) 04/02/2020 0501   BILIRUBINUR NEGATIVE 04/02/2020 0501   KETONESUR NEGATIVE 04/02/2020 0501   PROTEINUR 100 (A) 04/02/2020 0501   NITRITE NEGATIVE 04/02/2020 0501   LEUKOCYTESUR NEGATIVE 04/02/2020 0501   Sepsis  Labs: @LABRCNTIP (procalcitonin:4,lacticidven:4)  ) Recent Results (from the past 240 hour(s))  SARS CORONAVIRUS 2 (TAT 6-24 HRS) Nasopharyngeal Nasopharyngeal Swab     Status: None   Collection Time: 03/27/20 10:33 PM   Specimen: Nasopharyngeal Swab  Result Value Ref Range Status   SARS Coronavirus 2 NEGATIVE NEGATIVE Final    Comment: (NOTE) SARS-CoV-2 target nucleic acids are NOT DETECTED.  The SARS-CoV-2 RNA is generally detectable in upper and lower respiratory specimens during the acute phase of infection. Negative results do not preclude SARS-CoV-2 infection, do not rule out co-infections with other pathogens, and should not be used as the sole basis for treatment or other patient management decisions. Negative results must be combined with clinical observations, patient history, and epidemiological information. The expected result is Negative.  Fact Sheet for Patients: HairSlick.no  Fact Sheet for Healthcare Providers: quierodirigir.com  This test is not yet approved or cleared by the [place] FDA and  has been authorized for detection and/or diagnosis of SARS-CoV-2 by FDA under an Emergency Use Authorization (EUA). This EUA will remain  in effect (meaning this test can be used) for the duration of the COVID-19 declaration under Se ction 564(b)(1) of the Act, 21 U.S.C. section 360bbb-3(b)(1), unless the authorization is terminated or revoked sooner.  Performed at [facility], [address] [place], [place] [address]   Surgical pcr screen     Status: None   Collection Time: 04/02/20  7:59 AM   Specimen: Nasal Mucosa; Nasal Swab  Result Value Ref Range Status   MRSA, PCR NEGATIVE NEGATIVE Final   Staphylococcus aureus NEGATIVE NEGATIVE Final    Comment: (NOTE) The Xpert SA Assay (FDA approved for NASAL specimens in patients 40 years of age and older), is one component of a  comprehensive surveillance program. It is not intended to diagnose infection nor to guide or monitor treatment. Performed at [facility], [address], [place], [place] [address]       Radiology Studies: VAS US DOPPLER PRE CABG  Result Date: 04/01/2020 PREOPERATIVE VASCULAR EVALUATION  Indications:      Pre-CABG. Risk Factors:     Hypertension, Diabetes. Comparison Study: no prior Performing Technologist: Blanch Media RVS  Examination Guidelines: A complete evaluation includes B-mode imaging, spectral Doppler, color Doppler, and power Doppler as needed of all accessible portions of each vessel. Bilateral testing is considered an integral part of a complete examination. Limited  examinations for reoccurring indications may be performed as noted.  Right Carotid Findings: +----------+-------+-------+--------+---------------------------------+--------+           PSV    EDV    StenosisDescribe                         Comments           cm/s   cm/s                                                     +----------+-------+-------+--------+---------------------------------+--------+ CCA Prox  104    21             heterogenous                              +----------+-------+-------+--------+---------------------------------+--------+ CCA Distal88     13             heterogenous                              +----------+-------+-------+--------+---------------------------------+--------+ ICA Prox  287    87     60-79%  heterogenous, irregular and                                               calcific                                  +----------+-------+-------+--------+---------------------------------+--------+ ICA Mid   251    68                                                       +----------+-------+-------+--------+---------------------------------+--------+ ICA Distal154    53                                                        +----------+-------+-------+--------+---------------------------------+--------+ ECA       177                                                             +----------+-------+-------+--------+---------------------------------+--------+ Portions of this table do not appear on this page. +----------+--------+-------+--------+------------+           PSV cm/sEDV cmsDescribeArm Pressure +----------+--------+-------+--------+------------+ Subclavian65                                  +----------+--------+-------+--------+------------+ +---------+--------+--+--------+-+---------+ VertebralPSV cm/s19EDV cm/s7Antegrade +---------+--------+--+--------+-+---------+ Left Carotid Findings: +----------+--------+--------+--------+-------------------+--------------------+           PSV  cm/sEDV cm/sStenosisDescribe           Comments             +----------+--------+--------+--------+-------------------+--------------------+ CCA Prox  100     17              heterogenous                            +----------+--------+--------+--------+-------------------+--------------------+ CCA Distal68      13              calcific and                                                              irregular                               +----------+--------+--------+--------+-------------------+--------------------+ ICA Prox  227     31      1-39%   heterogenous and   Velocities may                                         calcific           underestimate degree                                                      of stenosis due to                                                        more proximal                                                             obstruction.         +----------+--------+--------+--------+-------------------+--------------------+ ICA Distal152     39                                                       +----------+--------+--------+--------+-------------------+--------------------+ ECA       170                                                             +----------+--------+--------+--------+-------------------+--------------------+ +----------+--------+--------+--------+------------+ SubclavianPSV cm/sEDV cm/sDescribeArm Pressure +----------+--------+--------+--------+------------+  55                                   +----------+--------+--------+--------+------------+ +---------+--------+--+--------+--+---------+ VertebralPSV cm/s45EDV cm/s15Antegrade +---------+--------+--+--------+--+---------+  ABI Findings: +--------+------------------+-----+---------+--------------------+ Right   Rt Pressure (mmHg)IndexWaveform Comment              +--------+------------------+-----+---------+--------------------+ Brachial                       biphasic restricted extremity +--------+------------------+-----+---------+--------------------+ PTA     196               2.84 triphasic                     +--------+------------------+-----+---------+--------------------+ DP      189               2.74 triphasic                     +--------+------------------+-----+---------+--------------------+ +--------+------------------+-----+---------+-------+ Left    Lt Pressure (mmHg)IndexWaveform Comment +--------+------------------+-----+---------+-------+ Brachial69                     biphasic         +--------+------------------+-----+---------+-------+ PTA     200               2.90 triphasic        +--------+------------------+-----+---------+-------+ DP      193               2.80 triphasic        +--------+------------------+-----+---------+-------+  Right Doppler Findings: +--------+--------+-----+--------+--------------------+ Site    PressureIndexDoppler Comments             +--------+--------+-----+--------+--------------------+  Brachial             biphasicrestricted extremity +--------+--------+-----+--------+--------------------+ Radial               biphasic                     +--------+--------+-----+--------+--------------------+ Ulnar                biphasic                     +--------+--------+-----+--------+--------------------+  Left Doppler Findings: +--------+--------+-----+--------+--------+ Site    PressureIndexDoppler Comments +--------+--------+-----+--------+--------+ Loreli DollarBrachial69           biphasic         +--------+--------+-----+--------+--------+ Radial               biphasic         +--------+--------+-----+--------+--------+ Ulnar                biphasic         +--------+--------+-----+--------+--------+  Summary: Right Carotid: Velocities 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 Left Carotid: Velocities in the left ICA are consistent with a 1-39% stenosis. Vertebrals: Bilateral vertebral arteries demonstrate antegrade flow. Right ABI: Resting right ankle-brachial index indicates noncompressible right lower extremity arteries. Left ABI: Resting left ankle-brachial index indicates noncompressible left lower extremity arteries. Right Upper Extremity: Doppler waveforms decrease 50% with right radial compression. Doppler waveforms decrease 50% with right ulnar compression. Left Upper Extremity: Doppler waveforms remain within normal limits with left radial compression. Doppler waveforms remain within normal limits with left ulnar compression.  Electronically  signed by Heath Lark on 04/01/2020 at 5:00:17 PM.    Final      Scheduled Meds: . aspirin  81 mg Oral Daily  . aspirin EC  81 mg Oral Daily  . atorvastatin  80 mg Oral Daily  . carvedilol  12.5 mg Oral BID WC  . [START ON 04/03/2020] epinephrine  0-10 mcg/min Intravenous To OR  . [START ON 04/03/2020] heparin-papaverine-plasmalyte irrigation   Irrigation To OR  . hydrALAZINE  50 mg Oral  TID  . hydrocerin   Topical Daily  . insulin aspart  0-15 Units Subcutaneous TID WC  . [START ON 04/03/2020] insulin   Intravenous To OR  . isosorbide mononitrate  30 mg Oral Daily  . [START ON 04/03/2020] magnesium sulfate  40 mEq Other To OR  . [START ON 04/03/2020] phenylephrine  30-200 mcg/min Intravenous To OR  . [START ON 04/03/2020] potassium chloride  80 mEq Other To OR  . [START ON 04/03/2020] tranexamic acid  15 mg/kg Intravenous To OR  . [START ON 04/03/2020] tranexamic acid  2 mg/kg Intracatheter To OR   Continuous Infusions: . [START ON 04/03/2020] cefUROXime (ZINACEF)  IV    . [START ON 04/03/2020] cefUROXime (ZINACEF)  IV    . [START ON 04/03/2020] dexmedetomidine    . [START ON 04/03/2020] heparin 30,000 units/NS 1000 mL solution for CELLSAVER    . heparin 1,600 Units/hr (04/02/20 0529)  . [START ON 04/03/2020] milrinone    . [START ON 04/03/2020] nitroGLYCERIN    . [START ON 04/03/2020] norepinephrine    . [START ON 04/03/2020] tranexamic acid (CYKLOKAPRON) infusion ([facility])    . [START ON 04/03/2020] vancomycin       LOS: 6 days   Time Spent in minutes   45 minutes  Thurmond Hildebran D.O. on 04/02/2020 at 11:11 AM  Between 7am to 7pm - Please see pager noted on [email or web address]  After 7pm go to [email or web address]  And look for the night coverage person covering for me after hours  [facility] Office  [phone]

## 2020-04-02 NOTE — Progress Notes (Signed)
Pt listening to music. Ambulated hall earlier and ambulated room again recently, 10-15 min. Has been practicing IS, 1250 mL observed. No questions regarding surgery. [id number] Ethelda Chick CES, ACSM 2:27 PM 04/02/2020

## 2020-04-02 NOTE — Progress Notes (Signed)
Progress Note  Patient Name: Curtis Watkins Date of Encounter: 04/02/2020  Primary Cardiologist:   Tobias AlexanderKatarina Nelson, MD   Subjective   No chest pain.  No SOB.   Inpatient Medications    Scheduled Meds: . aspirin  81 mg Oral Daily  . aspirin EC  81 mg Oral Daily  . atorvastatin  80 mg Oral Daily  . carvedilol  12.5 mg Oral BID WC  . [START ON 04/03/2020] epinephrine  0-10 mcg/min Intravenous To OR  . [START ON 04/03/2020] heparin-papaverine-plasmalyte irrigation   Irrigation To OR  . hydrALAZINE  25 mg Oral TID  . hydrocerin   Topical Daily  . insulin aspart  0-15 Units Subcutaneous TID WC  . [START ON 04/03/2020] insulin   Intravenous To OR  . isosorbide mononitrate  30 mg Oral Daily  . [START ON 04/03/2020] magnesium sulfate  40 mEq Other To OR  . [START ON 04/03/2020] phenylephrine  30-200 mcg/min Intravenous To OR  . [START ON 04/03/2020] potassium chloride  80 mEq Other To OR  . [START ON 04/03/2020] tranexamic acid  15 mg/kg Intravenous To OR  . [START ON 04/03/2020] tranexamic acid  2 mg/kg Intracatheter To OR   Continuous Infusions: . [START ON 04/03/2020] cefUROXime (ZINACEF)  IV    . [START ON 04/03/2020] cefUROXime (ZINACEF)  IV    . [START ON 04/03/2020] dexmedetomidine    . [START ON 04/03/2020] heparin 30,000 units/NS 1000 mL solution for CELLSAVER    . heparin 1,600 Units/hr (04/02/20 0529)  . [START ON 04/03/2020] milrinone    . [START ON 04/03/2020] nitroGLYCERIN    . [START ON 04/03/2020] norepinephrine    . [START ON 04/03/2020] tranexamic acid (CYKLOKAPRON) infusion ([facility])    . [START ON 04/03/2020] vancomycin     PRN Meds: acetaminophen, ondansetron (ZOFRAN) IV   Vital Signs    Vitals:   04/01/20 2014 04/01/20 2017 04/02/20 0348 04/02/20 0856  BP: (!) 159/55 (!) 159/55 (!) 168/77 (!) 174/61  Pulse: 75  70 72  Resp: 20  15 16   Temp: 98.2 F (36.8 C)  98.2 F (36.8 C) 98.3 F (36.8 C)  TempSrc: Oral  Oral Oral  SpO2: 100%  100% 100%  Weight:   77.1 kg   Height:         Intake/Output Summary (Last 24 hours) at 04/02/2020 1034 Last data filed at 04/02/2020 0844 Gross per 24 hour  Intake 2055.64 ml  Output 1550 ml  Net 505.64 ml   Filed Weights   03/31/20 0429 04/01/20 0249 04/02/20 0348  Weight: 73.6 kg 75.3 kg 77.1 kg    Telemetry    NSR - Personally Reviewed  ECG    NA - Personally Reviewed  Physical Exam   GEN: No acute distress.   Neck: No  JVD Cardiac: RRR, no murmurs, rubs, or gallops.  Respiratory: Clear  to auscultation bilaterally. GI: Soft, nontender, non-distended  MS: No  edema; No deformity. Neuro:  Nonfocal  Psych: Normal affect   Labs    Chemistry Recent Labs  Lab 03/31/20 0440 04/01/20 0219 04/02/20 0321  NA 137 137 138  K 4.1 4.2 3.9  CL 104 107 109  CO2 23 21* 22  GLUCOSE 126* 212* 136*  BUN 28* 29* 26*  CREATININE 1.59* 1.71* 1.37*  CALCIUM 8.5* 8.1* 8.0*  GFRNONAA 48* 44* 58*  ANIONGAP 10 9 7      Hematology Recent Labs  Lab 03/31/20 0440 04/01/20 0219 04/02/20 0321  WBC 5.8  5.3 4.8  RBC 3.15* 2.95* 2.67*  HGB 10.2* 9.4* 8.7*  HCT 29.4* 28.4* 25.2*  MCV 93.3 96.3 94.4  MCH 32.4 31.9 32.6  MCHC 34.7 33.1 34.5  RDW 12.6 12.4 12.7  PLT 255 212 187    Cardiac EnzymesNo results for input(s): TROPONINI in the last 168 hours. No results for input(s): TROPIPOC in the last 168 hours.   BNP Recent Labs  Lab 03/27/20 1811  BNP 1,383.1*     DDimer No results for input(s): DDIMER in the last 168 hours.   Radiology    VAS US DOPPLER PRE CABG  Result Date: 04/01/2020 PREOPERATIVE VASCULAR EVALUATION  Indications:      Pre-CABG. Risk Factors:     Hypertension, Diabetes. Comparison Study: no prior Performing Technologist: Blanch Media RVS  Examination Guidelines: A complete evaluation includes B-mode imaging, spectral Doppler, color Doppler, and power Doppler as needed of all accessible portions of each vessel. Bilateral testing is considered an integral part of a complete examination. Limited  examinations for reoccurring indications may be performed as noted.  Right Carotid Findings: +----------+-------+-------+--------+---------------------------------+--------+           PSV    EDV    StenosisDescribe                         Comments           cm/s   cm/s                                                     +----------+-------+-------+--------+---------------------------------+--------+ CCA Prox  104    21             heterogenous                              +----------+-------+-------+--------+---------------------------------+--------+ CCA Distal88     13             heterogenous                              +----------+-------+-------+--------+---------------------------------+--------+ ICA Prox  287    87     60-79%  heterogenous, irregular and                                               calcific                                  +----------+-------+-------+--------+---------------------------------+--------+ ICA Mid   251    68                                                       +----------+-------+-------+--------+---------------------------------+--------+ ICA Distal154    53                                                       +----------+-------+-------+--------+---------------------------------+--------+  ECA       177                                                             +----------+-------+-------+--------+---------------------------------+--------+ Portions of this table do not appear on this page. +----------+--------+-------+--------+------------+           PSV cm/sEDV cmsDescribeArm Pressure +----------+--------+-------+--------+------------+ Subclavian65                                  +----------+--------+-------+--------+------------+ +---------+--------+--+--------+-+---------+ VertebralPSV cm/s19EDV cm/s7Antegrade +---------+--------+--+--------+-+---------+ Left Carotid Findings:  +----------+--------+--------+--------+-------------------+--------------------+           PSV cm/sEDV cm/sStenosisDescribe           Comments             +----------+--------+--------+--------+-------------------+--------------------+ CCA Prox  100     17              heterogenous                            +----------+--------+--------+--------+-------------------+--------------------+ CCA Distal68      13              calcific and                                                              irregular                               +----------+--------+--------+--------+-------------------+--------------------+ ICA Prox  227     31      1-39%   heterogenous and   Velocities may                                         calcific           underestimate degree                                                      of stenosis due to                                                        more proximal                                                             obstruction.         +----------+--------+--------+--------+-------------------+--------------------+  ICA Distal152     39                                                      +----------+--------+--------+--------+-------------------+--------------------+ ECA       170                                                             +----------+--------+--------+--------+-------------------+--------------------+ +----------+--------+--------+--------+------------+ SubclavianPSV cm/sEDV cm/sDescribeArm Pressure +----------+--------+--------+--------+------------+           55                                   +----------+--------+--------+--------+------------+ +---------+--------+--+--------+--+---------+ VertebralPSV cm/s45EDV cm/s15Antegrade +---------+--------+--+--------+--+---------+  ABI Findings: +--------+------------------+-----+---------+--------------------+ Right    Rt Pressure (mmHg)IndexWaveform Comment              +--------+------------------+-----+---------+--------------------+ Brachial                       biphasic restricted extremity +--------+------------------+-----+---------+--------------------+ PTA     196               2.84 triphasic                     +--------+------------------+-----+---------+--------------------+ DP      189               2.74 triphasic                     +--------+------------------+-----+---------+--------------------+ +--------+------------------+-----+---------+-------+ Left    Lt Pressure (mmHg)IndexWaveform Comment +--------+------------------+-----+---------+-------+ Brachial69                     biphasic         +--------+------------------+-----+---------+-------+ PTA     200               2.90 triphasic        +--------+------------------+-----+---------+-------+ DP      193               2.80 triphasic        +--------+------------------+-----+---------+-------+  Right Doppler Findings: +--------+--------+-----+--------+--------------------+ Site    PressureIndexDoppler Comments             +--------+--------+-----+--------+--------------------+ Brachial             biphasicrestricted extremity +--------+--------+-----+--------+--------------------+ Radial               biphasic                     +--------+--------+-----+--------+--------------------+ Ulnar                biphasic                     +--------+--------+-----+--------+--------------------+  Left Doppler Findings: +--------+--------+-----+--------+--------+ Site    PressureIndexDoppler Comments +--------+--------+-----+--------+--------+ [id number]           biphasic         +--------+--------+-----+--------+--------+ Radial               biphasic         +--------+--------+-----+--------+--------+  Ulnar                biphasic          +--------+--------+-----+--------+--------+  Summary: Right Carotid: Velocities 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 Left Carotid: Velocities in the left ICA are consistent with a 1-39% stenosis. Vertebrals: Bilateral vertebral arteries demonstrate antegrade flow. Right ABI: Resting right ankle-brachial index indicates noncompressible right lower extremity arteries. Left ABI: Resting left ankle-brachial index indicates noncompressible left lower extremity arteries. Right Upper Extremity: Doppler waveforms decrease 50% with right radial compression. Doppler waveforms decrease 50% with right ulnar compression. Left Upper Extremity: Doppler waveforms remain within normal limits with left radial compression. Doppler waveforms remain within normal limits with left ulnar compression.  Electronically signed by Heath Lark on 04/01/2020 at 5:00:17 PM.    Final     Cardiac Studies   Diagnostic Dominance: Right      Patient Profile     65 y.o. male with a hx of HTN and DMwho is being followed by cardiology for the evaluation ofCHF and elevated troponin.  Assessment & Plan    NSTEMI:  CAD as above with plans for CABG tomorrow.    ISCHEMIC CM:   Continue current Coreg, lisinopril and Imdur.  Beta blocker increased yesterday in part secondary to increased BP.     HTN:   BP remains elevated.  I will increase the hydralazine.      CKD STAGE III:   Creat is improved.  No change in therapy.      For questions or updates, please contact [facility] Please consult [email or web address] for contact info under Cardiology/STEMI.   Signed, Rollene Rotunda, MD  04/02/2020, 10:34 AM

## 2020-04-02 NOTE — Progress Notes (Addendum)
Patient denies shortness of breath or chest pain. Creatinine decreased from 1.71 to 1.37. Scheduled for CABG in am with Dr. Vickey Sages

## 2020-04-02 NOTE — Progress Notes (Signed)
ANTICOAGULATION CONSULT NOTE   Pharmacy Consult for heparin Indication: chest pain/ACS   Labs: Recent Labs    03/31/20 0440 04/01/20 0219 04/02/20 0321  HGB 10.2* 9.4* 8.7*  HCT 29.4* 28.4* 25.2*  PLT 255 212 187  HEPARINUNFRC 0.51 0.65 0.58  CREATININE 1.59* 1.71* 1.37*    Assessment: 65 yo M presents w/ concern for NSTEMI. Heparin restarted post cath for severe 2V CAD. Plans for CABG on 2/9 -heparin level at goal.  Hgb down some to 8.7, pltc 187K.  No bleeding reported.  Goal of Therapy:  Heparin level 0.3-0.7 units/ml Monitor platelets by anticoagulation protocol: Yes   Plan:  Continue IV heparin at 1,600 units / hr Follow daily HL, monitor CBC  Thanks for allowing pharmacy to be a part of this patient's care.  Noah Delaine, RPh Clinical Pharmacist **Pharmacist phone directory can now be found on [email or web address] ([email or web address]).  Listed under [facility].

## 2020-04-02 NOTE — Plan of Care (Signed)
°  Problem: Education: °Goal: Ability to demonstrate management of disease process will improve °Outcome: Progressing °Goal: Ability to verbalize understanding of medication therapies will improve °Outcome: Progressing °Goal: Individualized Educational Video(s) °Outcome: Progressing °  °

## 2020-04-03 ENCOUNTER — Inpatient Hospital Stay (HOSPITAL_COMMUNITY): Payer: Self-pay | Admitting: Certified Registered Nurse Anesthetist

## 2020-04-03 ENCOUNTER — Inpatient Hospital Stay (HOSPITAL_COMMUNITY)
Admission: EM | Disposition: A | Discharge status: Home / Self Care | Payer: Self-pay | Source: Home / Self Care | Attending: Internal Medicine

## 2020-04-03 ENCOUNTER — Inpatient Hospital Stay (HOSPITAL_COMMUNITY): Payer: Self-pay

## 2020-04-03 DIAGNOSIS — Z951 Presence of aortocoronary bypass graft: Secondary | ICD-10-CM

## 2020-04-03 DIAGNOSIS — I25110 Atherosclerotic heart disease of native coronary artery with unstable angina pectoris: Secondary | ICD-10-CM

## 2020-04-03 HISTORY — PX: CENTRAL VENOUS CATHETER INSERTION: SHX401

## 2020-04-03 HISTORY — PX: CORONARY ARTERY BYPASS GRAFT: SHX141

## 2020-04-03 HISTORY — PX: RADIAL ARTERY HARVEST: SHX5067

## 2020-04-03 HISTORY — PX: TEE WITHOUT CARDIOVERSION: SHX5443

## 2020-04-03 LAB — POCT I-STAT, CHEM 8
BUN: 22 mg/dL (ref 8–23)
BUN: 21 mg/dL (ref 8–23)
BUN: 21 mg/dL (ref 8–23)
BUN: 22 mg/dL (ref 8–23)
BUN: 20 mg/dL (ref 8–23)
Calcium, Ion: 1.25 mmol/L (ref 1.15–1.40)
Calcium, Ion: 1.28 mmol/L (ref 1.15–1.40)
Calcium, Ion: 1.23 mmol/L (ref 1.15–1.40)
Calcium, Ion: 1.16 mmol/L (ref 1.15–1.40)
Calcium, Ion: 1.18 mmol/L (ref 1.15–1.40)
Chloride: 106 mmol/L (ref 98–111)
Chloride: 107 mmol/L (ref 98–111)
Chloride: 107 mmol/L (ref 98–111)
Chloride: 107 mmol/L (ref 98–111)
Chloride: 110 mmol/L (ref 98–111)
Creatinine, Ser: 1.2 mg/dL (ref 0.61–1.24)
Creatinine, Ser: 1.1 mg/dL (ref 0.61–1.24)
Creatinine, Ser: 1.1 mg/dL (ref 0.61–1.24)
Creatinine, Ser: 1.1 mg/dL (ref 0.61–1.24)
Creatinine, Ser: 0.9 mg/dL (ref 0.61–1.24)
Glucose, Bld: 124 mg/dL — ABNORMAL HIGH (ref 70–99)
Glucose, Bld: 108 mg/dL — ABNORMAL HIGH (ref 70–99)
Glucose, Bld: 114 mg/dL — ABNORMAL HIGH (ref 70–99)
Glucose, Bld: 130 mg/dL — ABNORMAL HIGH (ref 70–99)
Glucose, Bld: 114 mg/dL — ABNORMAL HIGH (ref 70–99)
HCT: 26 % — ABNORMAL LOW (ref 39.0–52.0)
HCT: 26 % — ABNORMAL LOW (ref 39.0–52.0)
HCT: 23 % — ABNORMAL LOW (ref 39.0–52.0)
HCT: 20 % — ABNORMAL LOW (ref 39.0–52.0)
HCT: 23 % — ABNORMAL LOW (ref 39.0–52.0)
Hemoglobin: 8.8 g/dL — ABNORMAL LOW (ref 13.0–17.0)
Hemoglobin: 8.8 g/dL — ABNORMAL LOW (ref 13.0–17.0)
Hemoglobin: 7.8 g/dL — ABNORMAL LOW (ref 13.0–17.0)
Hemoglobin: 6.8 g/dL — CL (ref 13.0–17.0)
Hemoglobin: 7.8 g/dL — ABNORMAL LOW (ref 13.0–17.0)
Potassium: 4.1 mmol/L (ref 3.5–5.1)
Potassium: 4.1 mmol/L (ref 3.5–5.1)
Potassium: 4.8 mmol/L (ref 3.5–5.1)
Potassium: 5.3 mmol/L — ABNORMAL HIGH (ref 3.5–5.1)
Potassium: 4.2 mmol/L (ref 3.5–5.1)
Sodium: 141 mmol/L (ref 135–145)
Sodium: 141 mmol/L (ref 135–145)
Sodium: 140 mmol/L (ref 135–145)
Sodium: 140 mmol/L (ref 135–145)
Sodium: 143 mmol/L (ref 135–145)
TCO2: 24 mmol/L (ref 22–32)
TCO2: 23 mmol/L (ref 22–32)
TCO2: 24 mmol/L (ref 22–32)
TCO2: 25 mmol/L (ref 22–32)
TCO2: 21 mmol/L — ABNORMAL LOW (ref 22–32)

## 2020-04-03 LAB — POCT I-STAT 7, (LYTES, BLD GAS, ICA,H+H)
Acid-Base Excess: 1 mmol/L (ref 0.0–2.0)
Acid-base deficit: 1 mmol/L (ref 0.0–2.0)
Acid-base deficit: 1 mmol/L (ref 0.0–2.0)
Acid-base deficit: 4 mmol/L — ABNORMAL HIGH (ref 0.0–2.0)
Acid-base deficit: 3 mmol/L — ABNORMAL HIGH (ref 0.0–2.0)
Acid-base deficit: 6 mmol/L — ABNORMAL HIGH (ref 0.0–2.0)
Acid-base deficit: 6 mmol/L — ABNORMAL HIGH (ref 0.0–2.0)
Bicarbonate: 24.6 mmol/L (ref 20.0–28.0)
Bicarbonate: 26 mmol/L (ref 20.0–28.0)
Bicarbonate: 21.1 mmol/L (ref 20.0–28.0)
Bicarbonate: 24 mmol/L (ref 20.0–28.0)
Bicarbonate: 23.1 mmol/L (ref 20.0–28.0)
Bicarbonate: 19.8 mmol/L — ABNORMAL LOW (ref 20.0–28.0)
Bicarbonate: 19.7 mmol/L — ABNORMAL LOW (ref 20.0–28.0)
Calcium, Ion: 1.24 mmol/L (ref 1.15–1.40)
Calcium, Ion: 1.16 mmol/L (ref 1.15–1.40)
Calcium, Ion: 1.16 mmol/L (ref 1.15–1.40)
Calcium, Ion: 1.19 mmol/L (ref 1.15–1.40)
Calcium, Ion: 1.24 mmol/L (ref 1.15–1.40)
Calcium, Ion: 1.22 mmol/L (ref 1.15–1.40)
Calcium, Ion: 1.24 mmol/L (ref 1.15–1.40)
HCT: 25 % — ABNORMAL LOW (ref 39.0–52.0)
HCT: 22 % — ABNORMAL LOW (ref 39.0–52.0)
HCT: 19 % — ABNORMAL LOW (ref 39.0–52.0)
HCT: 20 % — ABNORMAL LOW (ref 39.0–52.0)
HCT: 25 % — ABNORMAL LOW (ref 39.0–52.0)
HCT: 22 % — ABNORMAL LOW (ref 39.0–52.0)
HCT: 23 % — ABNORMAL LOW (ref 39.0–52.0)
Hemoglobin: 8.5 g/dL — ABNORMAL LOW (ref 13.0–17.0)
Hemoglobin: 7.5 g/dL — ABNORMAL LOW (ref 13.0–17.0)
Hemoglobin: 6.5 g/dL — CL (ref 13.0–17.0)
Hemoglobin: 6.8 g/dL — CL (ref 13.0–17.0)
Hemoglobin: 8.5 g/dL — ABNORMAL LOW (ref 13.0–17.0)
Hemoglobin: 7.5 g/dL — ABNORMAL LOW (ref 13.0–17.0)
Hemoglobin: 7.8 g/dL — ABNORMAL LOW (ref 13.0–17.0)
O2 Saturation: 100 %
O2 Saturation: 100 %
O2 Saturation: 100 %
O2 Saturation: 89 %
O2 Saturation: 99 %
O2 Saturation: 98 %
O2 Saturation: 96 %
Patient temperature: 35.4
Patient temperature: 36.1
Patient temperature: 36.2
Potassium: 4.1 mmol/L (ref 3.5–5.1)
Potassium: 4.4 mmol/L (ref 3.5–5.1)
Potassium: 4.2 mmol/L (ref 3.5–5.1)
Potassium: 4.8 mmol/L (ref 3.5–5.1)
Potassium: 4.1 mmol/L (ref 3.5–5.1)
Potassium: 4.6 mmol/L (ref 3.5–5.1)
Potassium: 4.7 mmol/L (ref 3.5–5.1)
Sodium: 140 mmol/L (ref 135–145)
Sodium: 142 mmol/L (ref 135–145)
Sodium: 140 mmol/L (ref 135–145)
Sodium: 143 mmol/L (ref 135–145)
Sodium: 142 mmol/L (ref 135–145)
Sodium: 140 mmol/L (ref 135–145)
Sodium: 139 mmol/L (ref 135–145)
TCO2: 26 mmol/L (ref 22–32)
TCO2: 27 mmol/L (ref 22–32)
TCO2: 22 mmol/L (ref 22–32)
TCO2: 25 mmol/L (ref 22–32)
TCO2: 24 mmol/L (ref 22–32)
TCO2: 21 mmol/L — ABNORMAL LOW (ref 22–32)
TCO2: 21 mmol/L — ABNORMAL LOW (ref 22–32)
pCO2 arterial: 43.8 mmHg (ref 32.0–48.0)
pCO2 arterial: 45 mmHg (ref 32.0–48.0)
pCO2 arterial: 39.1 mmHg (ref 32.0–48.0)
pCO2 arterial: 43.2 mmHg (ref 32.0–48.0)
pCO2 arterial: 41.2 mmHg (ref 32.0–48.0)
pCO2 arterial: 36.3 mmHg (ref 32.0–48.0)
pCO2 arterial: 36.8 mmHg (ref 32.0–48.0)
pH, Arterial: 7.358 (ref 7.350–7.450)
pH, Arterial: 7.369 (ref 7.350–7.450)
pH, Arterial: 7.34 — ABNORMAL LOW (ref 7.350–7.450)
pH, Arterial: 7.353 (ref 7.350–7.450)
pH, Arterial: 7.348 — ABNORMAL LOW (ref 7.350–7.450)
pH, Arterial: 7.34 — ABNORMAL LOW (ref 7.350–7.450)
pH, Arterial: 7.332 — ABNORMAL LOW (ref 7.350–7.450)
pO2, Arterial: 431 mmHg — ABNORMAL HIGH (ref 83.0–108.0)
pO2, Arterial: 367 mmHg — ABNORMAL HIGH (ref 83.0–108.0)
pO2, Arterial: 379 mmHg — ABNORMAL HIGH (ref 83.0–108.0)
pO2, Arterial: 61 mmHg — ABNORMAL LOW (ref 83.0–108.0)
pO2, Arterial: 125 mmHg — ABNORMAL HIGH (ref 83.0–108.0)
pO2, Arterial: 116 mmHg — ABNORMAL HIGH (ref 83.0–108.0)
pO2, Arterial: 87 mmHg (ref 83.0–108.0)

## 2020-04-03 LAB — GLUCOSE, CAPILLARY
Glucose-Capillary: 106 mg/dL — ABNORMAL HIGH (ref 70–99)
Glucose-Capillary: 105 mg/dL — ABNORMAL HIGH (ref 70–99)
Glucose-Capillary: 96 mg/dL (ref 70–99)
Glucose-Capillary: 100 mg/dL — ABNORMAL HIGH (ref 70–99)
Glucose-Capillary: 99 mg/dL (ref 70–99)
Glucose-Capillary: 102 mg/dL — ABNORMAL HIGH (ref 70–99)
Glucose-Capillary: 102 mg/dL — ABNORMAL HIGH (ref 70–99)
Glucose-Capillary: 121 mg/dL — ABNORMAL HIGH (ref 70–99)
Glucose-Capillary: 136 mg/dL — ABNORMAL HIGH (ref 70–99)
Glucose-Capillary: 132 mg/dL — ABNORMAL HIGH (ref 70–99)

## 2020-04-03 LAB — MAGNESIUM: Magnesium: 2.9 mg/dL — ABNORMAL HIGH (ref 1.7–2.4)

## 2020-04-03 LAB — APTT: aPTT: 34 seconds (ref 24–36)

## 2020-04-03 LAB — CBC
HCT: 27 % — ABNORMAL LOW (ref 39.0–52.0)
HCT: 27 % — ABNORMAL LOW (ref 39.0–52.0)
HCT: 24.5 % — ABNORMAL LOW (ref 39.0–52.0)
Hemoglobin: 8.9 g/dL — ABNORMAL LOW (ref 13.0–17.0)
Hemoglobin: 8.8 g/dL — ABNORMAL LOW (ref 13.0–17.0)
Hemoglobin: 8.4 g/dL — ABNORMAL LOW (ref 13.0–17.0)
MCH: 32 pg (ref 26.0–34.0)
MCH: 30.2 pg (ref 26.0–34.0)
MCH: 31.2 pg (ref 26.0–34.0)
MCHC: 33 g/dL (ref 30.0–36.0)
MCHC: 32.6 g/dL (ref 30.0–36.0)
MCHC: 34.3 g/dL (ref 30.0–36.0)
MCV: 97.1 fL (ref 80.0–100.0)
MCV: 92.8 fL (ref 80.0–100.0)
MCV: 91.1 fL (ref 80.0–100.0)
Platelets: 200 10*3/uL (ref 150–400)
Platelets: 112 10*3/uL — ABNORMAL LOW (ref 150–400)
Platelets: 148 10*3/uL — ABNORMAL LOW (ref 150–400)
RBC: 2.78 MIL/uL — ABNORMAL LOW (ref 4.22–5.81)
RBC: 2.91 MIL/uL — ABNORMAL LOW (ref 4.22–5.81)
RBC: 2.69 MIL/uL — ABNORMAL LOW (ref 4.22–5.81)
RDW: 12.5 % (ref 11.5–15.5)
RDW: 16.1 % — ABNORMAL HIGH (ref 11.5–15.5)
RDW: 16.6 % — ABNORMAL HIGH (ref 11.5–15.5)
WBC: 5.3 10*3/uL (ref 4.0–10.5)
WBC: 7.7 10*3/uL (ref 4.0–10.5)
WBC: 9.5 10*3/uL (ref 4.0–10.5)
nRBC: 0 % (ref 0.0–0.2)
nRBC: 0 % (ref 0.0–0.2)
nRBC: 0 % (ref 0.0–0.2)

## 2020-04-03 LAB — HEMOGLOBIN AND HEMATOCRIT, BLOOD
HCT: 20.2 % — ABNORMAL LOW (ref 39.0–52.0)
Hemoglobin: 6.7 g/dL — CL (ref 13.0–17.0)

## 2020-04-03 LAB — POCT I-STAT EG7
Acid-base deficit: 2 mmol/L (ref 0.0–2.0)
Bicarbonate: 23.2 mmol/L (ref 20.0–28.0)
Calcium, Ion: 1.04 mmol/L — ABNORMAL LOW (ref 1.15–1.40)
HCT: 19 % — ABNORMAL LOW (ref 39.0–52.0)
Hemoglobin: 6.5 g/dL — CL (ref 13.0–17.0)
O2 Saturation: 79 %
Potassium: 5 mmol/L (ref 3.5–5.1)
Sodium: 139 mmol/L (ref 135–145)
TCO2: 24 mmol/L (ref 22–32)
pCO2, Ven: 42.3 mmHg — ABNORMAL LOW (ref 44.0–60.0)
pH, Ven: 7.347 (ref 7.250–7.430)
pO2, Ven: 46 mmHg — ABNORMAL HIGH (ref 32.0–45.0)

## 2020-04-03 LAB — BASIC METABOLIC PANEL
Anion gap: 7 (ref 5–15)
Anion gap: 8 (ref 5–15)
BUN: 23 mg/dL (ref 8–23)
BUN: 22 mg/dL (ref 8–23)
CO2: 23 mmol/L (ref 22–32)
CO2: 18 mmol/L — ABNORMAL LOW (ref 22–32)
Calcium: 8.5 mg/dL — ABNORMAL LOW (ref 8.9–10.3)
Calcium: 7.8 mg/dL — ABNORMAL LOW (ref 8.9–10.3)
Chloride: 109 mmol/L (ref 98–111)
Chloride: 111 mmol/L (ref 98–111)
Creatinine, Ser: 1.4 mg/dL — ABNORMAL HIGH (ref 0.61–1.24)
Creatinine, Ser: 1.31 mg/dL — ABNORMAL HIGH (ref 0.61–1.24)
GFR, Estimated: 56 mL/min — ABNORMAL LOW (ref >60)
GFR, Estimated: >60 mL/min (ref >60)
Glucose, Bld: 119 mg/dL — ABNORMAL HIGH (ref 70–99)
Glucose, Bld: 117 mg/dL — ABNORMAL HIGH (ref 70–99)
Potassium: 4.2 mmol/L (ref 3.5–5.1)
Potassium: 4.6 mmol/L (ref 3.5–5.1)
Sodium: 139 mmol/L (ref 135–145)
Sodium: 137 mmol/L (ref 135–145)

## 2020-04-03 LAB — PLATELET COUNT: Platelets: 151 10*3/uL (ref 150–400)

## 2020-04-03 LAB — HEPARIN LEVEL (UNFRACTIONATED): Heparin Unfractionated: 0.54 IU/mL (ref 0.30–0.70)

## 2020-04-03 LAB — PROTIME-INR
INR: 1.4 — ABNORMAL HIGH (ref 0.8–1.2)
Prothrombin Time: 16.9 seconds — ABNORMAL HIGH (ref 11.4–15.2)

## 2020-04-03 LAB — PREPARE RBC (CROSSMATCH)

## 2020-04-03 SURGERY — CORONARY ARTERY BYPASS GRAFTING (CABG)
Anesthesia: General | Site: Groin

## 2020-04-03 MED ORDER — SODIUM CHLORIDE 0.9% FLUSH: 3.0000 mL | INTRAVENOUS | Status: DC | PRN

## 2020-04-03 MED ORDER — ARTIFICIAL TEARS OPHTHALMIC OINT
TOPICAL_OINTMENT | OPHTHALMIC | Status: DC | PRN
  Administered 2020-04-03: 1 via OPHTHALMIC

## 2020-04-03 MED ORDER — PROTAMINE SULFATE 10 MG/ML IV SOLN
INTRAVENOUS | Status: AC
  Filled 2020-04-03: qty 25

## 2020-04-03 MED ORDER — PROPOFOL 10 MG/ML IV BOLUS
INTRAVENOUS | Status: AC
  Filled 2020-04-03: qty 40

## 2020-04-03 MED ORDER — DOCUSATE SODIUM 100 MG PO CAPS
200.0000 mg | ORAL_CAPSULE | Freq: Every day | ORAL | Status: DC
  Administered 2020-04-04 – 2020-04-06 (×3): 200 mg via ORAL
  Filled 2020-04-03 (×4): qty 2

## 2020-04-03 MED ORDER — INSULIN REGULAR(HUMAN) IN NACL 100-0.9 UT/100ML-% IV SOLN
INTRAVENOUS | Status: DC
  Administered 2020-04-03: 1 [IU]/h via INTRAVENOUS

## 2020-04-03 MED ORDER — EPHEDRINE SULFATE-NACL 50-0.9 MG/10ML-% IV SOSY
PREFILLED_SYRINGE | INTRAVENOUS | Status: DC | PRN
  Administered 2020-04-03 (×2): 5 mg via INTRAVENOUS

## 2020-04-03 MED ORDER — ONDANSETRON HCL 4 MG/2ML IJ SOLN
4.0000 mg | Freq: Four times a day (QID) | INTRAMUSCULAR | Status: DC | PRN
  Administered 2020-04-03: 4 mg via INTRAVENOUS
  Filled 2020-04-03: qty 2

## 2020-04-03 MED ORDER — ALBUMIN HUMAN 5 % IV SOLN
250.0000 mL | INTRAVENOUS | Status: AC | PRN
  Administered 2020-04-03 – 2020-04-04 (×4): 12.5 g via INTRAVENOUS
  Filled 2020-04-03 (×2): qty 250

## 2020-04-03 MED ORDER — PROPOFOL 10 MG/ML IV BOLUS
INTRAVENOUS | Status: DC | PRN
  Administered 2020-04-03: 50 mg via INTRAVENOUS

## 2020-04-03 MED ORDER — FENTANYL CITRATE (PF) 250 MCG/5ML IJ SOLN
INTRAMUSCULAR | Status: AC
  Filled 2020-04-03: qty 25

## 2020-04-03 MED ORDER — HEPARIN SODIUM (PORCINE) 1000 UNIT/ML IJ SOLN
INTRAMUSCULAR | Status: AC
  Filled 2020-04-03: qty 1

## 2020-04-03 MED ORDER — PROTAMINE SULFATE 10 MG/ML IV SOLN
INTRAVENOUS | Status: AC
  Filled 2020-04-03: qty 5

## 2020-04-03 MED ORDER — MILRINONE LACTATE IN DEXTROSE 20-5 MG/100ML-% IV SOLN
0.2500 ug/kg/min | INTRAVENOUS | Status: DC
  Administered 2020-04-03 – 2020-04-04 (×2): 0.25 ug/kg/min via INTRAVENOUS
  Filled 2020-04-03: qty 100

## 2020-04-03 MED ORDER — ASPIRIN EC 325 MG PO TBEC
325.0000 mg | DELAYED_RELEASE_TABLET | Freq: Every day | ORAL | Status: DC
  Administered 2020-04-04 – 2020-04-07 (×4): 325 mg via ORAL
  Filled 2020-04-03 (×4): qty 1

## 2020-04-03 MED ORDER — MIDAZOLAM HCL 2 MG/2ML IJ SOLN: 2.0000 mg | INTRAMUSCULAR | Status: DC | PRN

## 2020-04-03 MED ORDER — BUPIVACAINE LIPOSOME 1.3 % IJ SUSP
20.0000 mL | INTRAMUSCULAR | Status: DC
  Filled 2020-04-03: qty 20

## 2020-04-03 MED ORDER — METOPROLOL TARTRATE 5 MG/5ML IV SOLN
2.5000 mg | INTRAVENOUS | Status: DC | PRN
  Administered 2020-04-05: 2.5 mg via INTRAVENOUS
  Filled 2020-04-03: qty 5

## 2020-04-03 MED ORDER — POTASSIUM CHLORIDE 10 MEQ/50ML IV SOLN: 10.0000 meq | INTRAVENOUS | Status: AC

## 2020-04-03 MED ORDER — SODIUM CHLORIDE 0.9 % IV SOLN: INTRAVENOUS | Status: DC

## 2020-04-03 MED ORDER — ACETAMINOPHEN 160 MG/5ML PO SOLN: 1000.0000 mg | Freq: Four times a day (QID) | ORAL | Status: DC

## 2020-04-03 MED ORDER — NITROGLYCERIN IN D5W 200-5 MCG/ML-% IV SOLN
7.0000 ug/min | INTRAVENOUS | Status: DC
  Administered 2020-04-03: 5 ug/min via INTRAVENOUS

## 2020-04-03 MED ORDER — LACTATED RINGERS IV SOLN: INTRAVENOUS | Status: DC

## 2020-04-03 MED ORDER — HEPARIN SODIUM (PORCINE) 1000 UNIT/ML IJ SOLN
INTRAMUSCULAR | Status: DC | PRN
  Administered 2020-04-03: 28000 [IU] via INTRAVENOUS

## 2020-04-03 MED ORDER — FENTANYL CITRATE (PF) 100 MCG/2ML IJ SOLN
INTRAMUSCULAR | Status: DC | PRN
  Administered 2020-04-03: 50 ug via INTRAVENOUS
  Administered 2020-04-03 (×4): 100 ug via INTRAVENOUS
  Administered 2020-04-03 (×2): 50 ug via INTRAVENOUS
  Administered 2020-04-03: 100 ug via INTRAVENOUS
  Administered 2020-04-03: 200 ug via INTRAVENOUS
  Administered 2020-04-03: 50 ug via INTRAVENOUS

## 2020-04-03 MED ORDER — OXYCODONE HCL 5 MG PO TABS
5.0000 mg | ORAL_TABLET | ORAL | Status: DC | PRN
Start: 2020-04-03 — End: 2020-04-07

## 2020-04-03 MED ORDER — STERILE WATER FOR INJECTION IJ SOLN
INTRAMUSCULAR | Status: AC
  Filled 2020-04-03: qty 10

## 2020-04-03 MED ORDER — PLASMA-LYTE 148 IV SOLN
INTRAVENOUS | Status: DC | PRN
  Administered 2020-04-03: 500 mL

## 2020-04-03 MED ORDER — ALBUMIN HUMAN 5 % IV SOLN: INTRAVENOUS | Status: DC | PRN

## 2020-04-03 MED ORDER — ASPIRIN 81 MG PO CHEW
324.0000 mg | CHEWABLE_TABLET | Freq: Every day | ORAL | Status: DC
  Filled 2020-04-03: qty 4

## 2020-04-03 MED ORDER — TRAMADOL HCL 50 MG PO TABS
50.0000 mg | ORAL_TABLET | ORAL | Status: DC | PRN
  Administered 2020-04-05: 100 mg via ORAL
  Filled 2020-04-03: qty 2

## 2020-04-03 MED ORDER — MIDAZOLAM HCL (PF) 5 MG/ML IJ SOLN
INTRAMUSCULAR | Status: DC | PRN
  Administered 2020-04-03: 1 mg via INTRAVENOUS
  Administered 2020-04-03 (×3): 2 mg via INTRAVENOUS
  Administered 2020-04-03: 1 mg via INTRAVENOUS
  Administered 2020-04-03: 2 mg via INTRAVENOUS

## 2020-04-03 MED ORDER — CEFUROXIME SODIUM 1.5 G IV SOLR
1.5000 g | Freq: Two times a day (BID) | INTRAVENOUS | Status: AC
  Administered 2020-04-03 – 2020-04-05 (×4): 1.5 g via INTRAVENOUS
  Filled 2020-04-03 (×3): qty 1.5

## 2020-04-03 MED ORDER — VANCOMYCIN HCL 1000 MG IV SOLR
INTRAVENOUS | Status: AC
  Filled 2020-04-03: qty 3000

## 2020-04-03 MED ORDER — DEXTROSE 50 % IV SOLN
0.0000 mL | INTRAVENOUS | Status: DC | PRN
Start: 2020-04-03 — End: 2020-04-04

## 2020-04-03 MED ORDER — ACETAMINOPHEN 500 MG PO TABS
1000.0000 mg | ORAL_TABLET | Freq: Four times a day (QID) | ORAL | Status: DC
  Administered 2020-04-03 – 2020-04-06 (×11): 1000 mg via ORAL
  Filled 2020-04-03 (×13): qty 2

## 2020-04-03 MED ORDER — MORPHINE SULFATE (PF) 2 MG/ML IV SOLN
1.0000 mg | INTRAVENOUS | Status: DC | PRN
Start: 2020-04-03 — End: 2020-04-07
  Administered 2020-04-03: 4 mg via INTRAVENOUS
  Administered 2020-04-04 – 2020-04-05 (×3): 2 mg via INTRAVENOUS
  Filled 2020-04-03 (×3): qty 1
  Filled 2020-04-03: qty 2

## 2020-04-03 MED ORDER — FAMOTIDINE IN NACL 20-0.9 MG/50ML-% IV SOLN
20.0000 mg | Freq: Two times a day (BID) | INTRAVENOUS | Status: AC
  Administered 2020-04-03: 20 mg via INTRAVENOUS
  Filled 2020-04-03 (×2): qty 50

## 2020-04-03 MED ORDER — CHLORHEXIDINE GLUCONATE 0.12 % MT SOLN
15.0000 mL | OROMUCOSAL | Status: AC
  Administered 2020-04-03: 15 mL via OROMUCOSAL

## 2020-04-03 MED ORDER — BUPIVACAINE HCL (PF) 0.5 % IJ SOLN
INTRAMUSCULAR | Status: AC
  Filled 2020-04-03: qty 30

## 2020-04-03 MED ORDER — BISACODYL 10 MG RE SUPP: 10.0000 mg | Freq: Every day | RECTAL | Status: DC

## 2020-04-03 MED ORDER — METOPROLOL TARTRATE 25 MG/10 ML ORAL SUSPENSION
12.5000 mg | Freq: Two times a day (BID) | ORAL | Status: DC
  Filled 2020-04-03 (×2): qty 5

## 2020-04-03 MED ORDER — ACETAMINOPHEN 160 MG/5ML PO SOLN: 650.0000 mg | Freq: Once | ORAL | Status: DC

## 2020-04-03 MED ORDER — ROCURONIUM BROMIDE 10 MG/ML (PF) SYRINGE
PREFILLED_SYRINGE | INTRAVENOUS | Status: DC | PRN
  Administered 2020-04-03: 100 mg via INTRAVENOUS
  Administered 2020-04-03 (×3): 50 mg via INTRAVENOUS

## 2020-04-03 MED ORDER — CARVEDILOL 12.5 MG PO TABS
12.5000 mg | ORAL_TABLET | Freq: Two times a day (BID) | ORAL | Status: DC
  Administered 2020-04-03: 12.5 mg via ORAL
  Filled 2020-04-03: qty 1

## 2020-04-03 MED ORDER — VANCOMYCIN HCL 1000 MG IV SOLR
INTRAVENOUS | Status: DC | PRN
  Administered 2020-04-03: 3 g

## 2020-04-03 MED ORDER — DEXMEDETOMIDINE HCL IN NACL 400 MCG/100ML IV SOLN
0.0000 ug/kg/h | INTRAVENOUS | Status: DC
  Administered 2020-04-03 (×2): 0.7 ug/kg/h via INTRAVENOUS
  Filled 2020-04-03: qty 100

## 2020-04-03 MED ORDER — SODIUM CHLORIDE 0.9 % IV SOLN: 250.0000 mL | INTRAVENOUS | Status: DC

## 2020-04-03 MED ORDER — SODIUM CHLORIDE 0.45 % IV SOLN
INTRAVENOUS | Status: DC | PRN
Start: 2020-04-03 — End: 2020-04-07

## 2020-04-03 MED ORDER — PANTOPRAZOLE SODIUM 40 MG PO TBEC
40.0000 mg | DELAYED_RELEASE_TABLET | Freq: Every day | ORAL | Status: DC
  Administered 2020-04-05 – 2020-04-07 (×3): 40 mg via ORAL
  Filled 2020-04-03 (×3): qty 1

## 2020-04-03 MED ORDER — ORAL CARE MOUTH RINSE
15.0000 mL | Freq: Two times a day (BID) | OROMUCOSAL | Status: DC
  Administered 2020-04-03 – 2020-04-07 (×7): 15 mL via OROMUCOSAL

## 2020-04-03 MED ORDER — LACTATED RINGERS IV SOLN: INTRAVENOUS | Status: DC | PRN

## 2020-04-03 MED ORDER — ACETAMINOPHEN 650 MG RE SUPP: 650.0000 mg | Freq: Once | RECTAL | Status: DC

## 2020-04-03 MED ORDER — PHENYLEPHRINE 40 MCG/ML (10ML) SYRINGE FOR IV PUSH (FOR BLOOD PRESSURE SUPPORT)
PREFILLED_SYRINGE | INTRAVENOUS | Status: DC | PRN
  Administered 2020-04-03: 120 ug via INTRAVENOUS

## 2020-04-03 MED ORDER — LIDOCAINE HCL (CARDIAC) PF 100 MG/5ML IV SOSY
PREFILLED_SYRINGE | INTRAVENOUS | Status: DC | PRN
  Administered 2020-04-03: 80 mg via INTRATRACHEAL

## 2020-04-03 MED ORDER — BUPIVACAINE LIPOSOME 1.3 % IJ SUSP
INTRAMUSCULAR | Status: DC | PRN
  Administered 2020-04-03: 50 mL

## 2020-04-03 MED ORDER — BISACODYL 5 MG PO TBEC
10.0000 mg | DELAYED_RELEASE_TABLET | Freq: Every day | ORAL | Status: DC
  Administered 2020-04-04 – 2020-04-06 (×3): 10 mg via ORAL
  Filled 2020-04-03 (×3): qty 2

## 2020-04-03 MED ORDER — ROCURONIUM BROMIDE 10 MG/ML (PF) SYRINGE
PREFILLED_SYRINGE | INTRAVENOUS | Status: AC
  Filled 2020-04-03: qty 10

## 2020-04-03 MED ORDER — LACTATED RINGERS IV SOLN
500.0000 mL | Freq: Once | INTRAVENOUS | Status: AC | PRN
  Administered 2020-04-04: 500 mL via INTRAVENOUS

## 2020-04-03 MED ORDER — PHENYLEPHRINE HCL-NACL 20-0.9 MG/250ML-% IV SOLN: 0.0000 ug/min | INTRAVENOUS | Status: DC

## 2020-04-03 MED ORDER — MAGNESIUM SULFATE 4 GM/100ML IV SOLN
4.0000 g | Freq: Once | INTRAVENOUS | Status: AC
  Administered 2020-04-03: 4 g via INTRAVENOUS
  Filled 2020-04-03: qty 100

## 2020-04-03 MED ORDER — CHLORHEXIDINE GLUCONATE CLOTH 2 % EX PADS
6.0000 | MEDICATED_PAD | Freq: Every day | CUTANEOUS | Status: DC
  Administered 2020-04-03 – 2020-04-06 (×4): 6 via TOPICAL

## 2020-04-03 MED ORDER — MIDAZOLAM HCL (PF) 10 MG/2ML IJ SOLN
INTRAMUSCULAR | Status: AC
  Filled 2020-04-03: qty 2

## 2020-04-03 MED ORDER — VANCOMYCIN HCL IN DEXTROSE 1-5 GM/200ML-% IV SOLN
1000.0000 mg | Freq: Once | INTRAVENOUS | Status: AC
  Administered 2020-04-03: 1000 mg via INTRAVENOUS
  Filled 2020-04-03: qty 200

## 2020-04-03 MED ORDER — METOPROLOL TARTRATE 12.5 MG HALF TABLET
12.5000 mg | ORAL_TABLET | Freq: Two times a day (BID) | ORAL | Status: DC
  Administered 2020-04-04 – 2020-04-05 (×3): 12.5 mg via ORAL
  Filled 2020-04-03 (×4): qty 1

## 2020-04-03 MED ORDER — EPHEDRINE 5 MG/ML INJ
INTRAVENOUS | Status: AC
  Filled 2020-04-03: qty 10

## 2020-04-03 MED ORDER — 0.9 % SODIUM CHLORIDE (POUR BTL) OPTIME
TOPICAL | Status: DC | PRN
  Administered 2020-04-03: 1000 mL

## 2020-04-03 MED ORDER — PROTAMINE SULFATE 10 MG/ML IV SOLN
INTRAVENOUS | Status: DC | PRN
  Administered 2020-04-03: 30 mg via INTRAVENOUS
  Administered 2020-04-03: 250 mg via INTRAVENOUS

## 2020-04-03 MED ORDER — PHENYLEPHRINE 40 MCG/ML (10ML) SYRINGE FOR IV PUSH (FOR BLOOD PRESSURE SUPPORT)
PREFILLED_SYRINGE | INTRAVENOUS | Status: AC
  Filled 2020-04-03: qty 10

## 2020-04-03 MED ORDER — SODIUM CHLORIDE 0.9% FLUSH
3.0000 mL | Freq: Two times a day (BID) | INTRAVENOUS | Status: DC
  Administered 2020-04-04 – 2020-04-06 (×5): 3 mL via INTRAVENOUS

## 2020-04-03 SURGICAL SUPPLY — 101 items
ADAPTER CARDIO PERF ANTE/RETRO (ADAPTER) ×6 IMPLANT
APPLICATOR TIP COSEAL (VASCULAR PRODUCTS) IMPLANT
APPLIER CLIP 9.375 SM OPEN (CLIP) ×6
BAG DECANTER FOR FLEXI CONT (MISCELLANEOUS) ×6 IMPLANT
BLADE CLIPPER SURG (BLADE) ×6 IMPLANT
BLADE STERNUM SYSTEM 6 (BLADE) ×6 IMPLANT
BLADE SURG 15 STRL LF DISP TIS (BLADE) ×4 IMPLANT
BLADE SURG 15 STRL SS (BLADE) ×2
BNDG ELASTIC 4X5.8 VLCR STR LF (GAUZE/BANDAGES/DRESSINGS) ×6 IMPLANT
BNDG ELASTIC 6X5.8 VLCR STR LF (GAUZE/BANDAGES/DRESSINGS) ×6 IMPLANT
BNDG GAUZE ELAST 4 BULKY (GAUZE/BANDAGES/DRESSINGS) ×6 IMPLANT
CANISTER SUCT 3000ML PPV (MISCELLANEOUS) ×6 IMPLANT
CATH CPB KIT HENDRICKSON (MISCELLANEOUS) ×6 IMPLANT
CATH ROBINSON RED A/P 18FR (CATHETERS) ×12 IMPLANT
CLIP APPLIE 9.375 SM OPEN (CLIP) ×4 IMPLANT
CLIP RETRACTION 3.0MM CORONARY (MISCELLANEOUS) ×6 IMPLANT
CLIP VESOCCLUDE MED 24/CT (CLIP) IMPLANT
CLIP VESOCCLUDE SM WIDE 24/CT (CLIP) ×18 IMPLANT
COVER MAYO STAND STRL (DRAPES) ×6 IMPLANT
CUFF TOURN SGL QUICK 18X4 (TOURNIQUET CUFF) IMPLANT
CUFF TOURN SGL QUICK 24 (TOURNIQUET CUFF)
CUFF TRNQT CYL 24X4X16.5-23 (TOURNIQUET CUFF) IMPLANT
DERMABOND ADVANCED (GAUZE/BANDAGES/DRESSINGS) ×2
DERMABOND ADVANCED .7 DNX12 (GAUZE/BANDAGES/DRESSINGS) ×4 IMPLANT
DRAIN CHANNEL 28F RND 3/8 FF (WOUND CARE) ×18 IMPLANT
DRAPE CARDIOVASCULAR INCISE (DRAPES) ×2
DRAPE EXTREMITY T 121X128X90 (DISPOSABLE) ×6 IMPLANT
DRAPE HALF SHEET 40X57 (DRAPES) ×6 IMPLANT
DRAPE SLUSH/WARMER DISC (DRAPES) ×6 IMPLANT
DRAPE SRG 135X102X78XABS (DRAPES) ×4 IMPLANT
DRSG AQUACEL AG ADV 3.5X14 (GAUZE/BANDAGES/DRESSINGS) ×6 IMPLANT
ELECT CAUTERY BLADE 6.4 (BLADE) ×6 IMPLANT
ELECT REM PT RETURN 9FT ADLT (ELECTROSURGICAL) ×12
ELECTRODE REM PT RTRN 9FT ADLT (ELECTROSURGICAL) ×8 IMPLANT
FELT TEFLON 1X6 (MISCELLANEOUS) ×12 IMPLANT
GAUZE SPONGE 4X4 12PLY STRL (GAUZE/BANDAGES/DRESSINGS) ×12 IMPLANT
GEL ULTRASOUND 20GR AQUASONIC (MISCELLANEOUS) ×6 IMPLANT
GLOVE NEODERM STRL 7.5 LF PF (GLOVE) ×12 IMPLANT
GLOVE SURG NEODERM 7.5  LF PF (GLOVE) ×6
GOWN STRL REUS W/ TWL LRG LVL3 (GOWN DISPOSABLE) ×16 IMPLANT
GOWN STRL REUS W/TWL LRG LVL3 (GOWN DISPOSABLE) ×8
HEMOSTAT POWDER SURGIFOAM 1G (HEMOSTASIS) ×12 IMPLANT
INSERT FOGARTY XLG (MISCELLANEOUS) ×6 IMPLANT
INSERT SUTURE HOLDER (MISCELLANEOUS) ×6 IMPLANT
KIT BASIN OR (CUSTOM PROCEDURE TRAY) ×6 IMPLANT
KIT SUCTION CATH 14FR (SUCTIONS) ×6 IMPLANT
KIT TURNOVER KIT B (KITS) ×6 IMPLANT
KIT VASOVIEW HEMOPRO 2 VH 4000 (KITS) ×6 IMPLANT
MARKER GRAFT CORONARY BYPASS (MISCELLANEOUS) ×18 IMPLANT
MARKER SKIN DUAL TIP RULER LAB (MISCELLANEOUS) ×6 IMPLANT
NEEDLE 18GX1X1/2 (RX/OR ONLY) (NEEDLE) ×6 IMPLANT
NS IRRIG 1000ML POUR BTL (IV SOLUTION) ×30 IMPLANT
PACK E OPEN HEART (SUTURE) ×6 IMPLANT
PACK OPEN HEART (CUSTOM PROCEDURE TRAY) ×6 IMPLANT
PACK SPY-PHI (KITS) ×6 IMPLANT
PAD ARMBOARD 7.5X6 YLW CONV (MISCELLANEOUS) ×12 IMPLANT
PAD ELECT DEFIB RADIOL ZOLL (MISCELLANEOUS) ×6 IMPLANT
PENCIL BUTTON HOLSTER BLD 10FT (ELECTRODE) ×6 IMPLANT
POSITIONER HEAD DONUT 9IN (MISCELLANEOUS) ×6 IMPLANT
POWDER SURGICEL 3.0 GRAM (HEMOSTASIS) ×6 IMPLANT
PUNCH AORTIC ROTATE 4.5MM 8IN (MISCELLANEOUS) ×6 IMPLANT
SEALANT SURG COSEAL 4ML (VASCULAR PRODUCTS) ×6 IMPLANT
SEALANT SURG COSEAL 8ML (VASCULAR PRODUCTS) IMPLANT
SET CARDIOPLEGIA MPS 5001102 (MISCELLANEOUS) ×6 IMPLANT
SHEARS HARMONIC 9CM CVD (BLADE) IMPLANT
STAPLER VISISTAT 35W (STAPLE) ×6 IMPLANT
STOPCOCK 4 WAY LG BORE MALE ST (IV SETS) ×6 IMPLANT
SUT BONE WAX W31G (SUTURE) ×6 IMPLANT
SUT MNCRL AB 3-0 PS2 18 (SUTURE) ×12 IMPLANT
SUT PDS AB 1 CTX 36 (SUTURE) ×12 IMPLANT
SUT PROLENE 3 0 SH DA (SUTURE) ×12 IMPLANT
SUT PROLENE 5 0 C 1 36 (SUTURE) IMPLANT
SUT PROLENE 6 0 C 1 30 (SUTURE) ×18 IMPLANT
SUT PROLENE 8 0 BV175 6 (SUTURE) ×6 IMPLANT
SUT PROLENE BLUE 7 0 (SUTURE) ×6 IMPLANT
SUT SILK 2 0 SH CR/8 (SUTURE) IMPLANT
SUT SILK 3 0 SH CR/8 (SUTURE) IMPLANT
SUT STEEL 6MS V (SUTURE) ×6 IMPLANT
SUT STEEL SZ 6 DBL 3X14 BALL (SUTURE) ×6 IMPLANT
SUT VIC AB 2-0 CT1 27 (SUTURE)
SUT VIC AB 2-0 CT1 TAPERPNT 27 (SUTURE) IMPLANT
SUT VIC AB 2-0 CTX 27 (SUTURE) IMPLANT
SUT VIC AB 3-0 SH 27 (SUTURE)
SUT VIC AB 3-0 SH 27X BRD (SUTURE) IMPLANT
SUT VIC AB 3-0 X1 27 (SUTURE) IMPLANT
SYR 10ML LL (SYRINGE) IMPLANT
SYR 30ML LL (SYRINGE) ×6 IMPLANT
SYR 3ML LL SCALE MARK (SYRINGE) ×6 IMPLANT
SYR 50ML SLIP (SYRINGE) IMPLANT
SYSTEM SAHARA CHEST DRAIN ATS (WOUND CARE) ×6 IMPLANT
TAPE CLOTH SURG 4X10 WHT LF (GAUZE/BANDAGES/DRESSINGS) ×6 IMPLANT
TOWEL GREEN STERILE (TOWEL DISPOSABLE) ×6 IMPLANT
TOWEL GREEN STERILE FF (TOWEL DISPOSABLE) ×6 IMPLANT
TRAY CATH LUMEN 1 20CM STRL (SET/KITS/TRAYS/PACK) ×6 IMPLANT
TRAY FOLEY SLVR 16FR TEMP STAT (SET/KITS/TRAYS/PACK) ×6 IMPLANT
TUBE SUCTION CARDIAC 10FR (CANNULA) ×6 IMPLANT
TUBING ART PRESS 48 MALE/FEM (TUBING) ×12 IMPLANT
TUBING LAP HI FLOW INSUFFLATIO (TUBING) ×6 IMPLANT
UNDERPAD 30X36 HEAVY ABSORB (UNDERPADS AND DIAPERS) ×6 IMPLANT
WATER STERILE IRR 1000ML POUR (IV SOLUTION) ×12 IMPLANT
WATER STERILE IRR 1000ML UROMA (IV SOLUTION) IMPLANT

## 2020-04-03 NOTE — Transfer of Care (Signed)
Immediate Anesthesia Transfer of Care Note  Patient: Curtis Watkins  Procedure(s) Performed: CORONARY ARTERY BYPASS GRAFTING (CABG) TIMES FOUR USING LEFT INTERNAL MAMMARY ARTERY AND LEFT RADIAL ARTERY. (N/A Chest) LEFT RADIAL ARTERY HARVEST (Left Arm Lower) TRANSESOPHAGEAL ECHOCARDIOGRAM (TEE) (N/A Esophagus) INDOCYANINE GREEN FLUORESCENCE IMAGING (ICG) (N/A Chest) INSERTION OF FEMORAL ARTERIAL LINE (Left Groin)  Patient Location: ICU  Anesthesia Type:General  Level of Consciousness: sedated and Patient remains intubated per anesthesia plan  Airway & Oxygen Therapy: Patient remains intubated per anesthesia plan and Patient placed on Ventilator (see vital sign flow sheet for setting)  Post-op Assessment: Report given to RN and Post -op Vital signs reviewed and stable  Post vital signs: Reviewed and stable  Last Vitals:  Vitals Value Taken Time  BP    Temp    Pulse 89 04/03/20 1343  Resp 12 04/03/20 1343  SpO2 100 % 04/03/20 1343  Vitals shown include unvalidated device data.  Last Pain:  Vitals:   04/03/20 0516  TempSrc: Oral  PainSc:          Complications: No complications documented.   Patient transported to ICU with standard monitors (HR, BP, SPO2, RR) and emergency drugs/equipment. Controlled ventilation maintained via ambu bag. Report given to bedside RN and respiratory therapist. Pt connected to ICU monitor and ventilator. All questions answered and vital signs stable before leaving

## 2020-04-03 NOTE — Anesthesia Postprocedure Evaluation (Signed)
Anesthesia Post Note  Patient: Curtis Watkins  Procedure(s) Performed: CORONARY ARTERY BYPASS GRAFTING (CABG) TIMES FOUR USING LEFT INTERNAL MAMMARY ARTERY AND LEFT RADIAL ARTERY. (N/A Chest) LEFT RADIAL ARTERY HARVEST (Left Arm Lower) TRANSESOPHAGEAL ECHOCARDIOGRAM (TEE) (N/A Esophagus) INDOCYANINE GREEN FLUORESCENCE IMAGING (ICG) (N/A Chest) INSERTION OF FEMORAL ARTERIAL LINE (Left Groin)     Patient location during evaluation: SICU Anesthesia Type: General Level of consciousness: sedated Pain management: pain level controlled Vital Signs Assessment: post-procedure vital signs reviewed and stable Respiratory status: patient remains intubated per anesthesia plan Cardiovascular status: stable Postop Assessment: no apparent nausea or vomiting Anesthetic complications: no   No complications documented.  Last Vitals:  Vitals:   04/03/20 1539 04/03/20 1600  BP:  103/78  Pulse:  80  Resp: 12 (!) 0  Temp:  (!) 35.8 C  SpO2: 100% 100%    Last Pain:  Vitals:   04/03/20 1600  TempSrc: Core  PainSc:                  Cecile Hearing

## 2020-04-03 NOTE — Procedures (Signed)
Extubation Procedure Note  Patient Details:   Name: Curtis Watkins DOB: 1955/11/27 MRN: [id number]   Airway Documentation:    Vent end date: 04/03/20 Vent end time: 1845   Evaluation  O2 sats: stable throughout Complications: No apparent complications Patient did tolerate procedure well. Bilateral Breath Sounds: Clear,Diminished   Yes   Patient was extubated to a 4L [place] without any complications, dyspnea or stridor noted. Patient was instructed on IS x 5. Positive cuff leak prior to extubation. NIF: -26, VC: 1.6L.  Carlynn Spry 04/03/2020, 6:45 PM

## 2020-04-03 NOTE — Anesthesia Procedure Notes (Signed)
Procedure Name: Intubation Date/Time: 04/03/2020 8:34 AM Performed by: Darletta Moll, CRNA Pre-anesthesia Checklist: Patient identified, Emergency Drugs available, Suction available and Patient being monitored Patient Re-evaluated:Patient Re-evaluated prior to induction Oxygen Delivery Method: Circle system utilized Preoxygenation: Pre-oxygenation with 100% oxygen Induction Type: IV induction Ventilation: Mask ventilation without difficulty Laryngoscope Size: Mac and 4 Grade View: Grade II Tube type: Oral Tube size: 8.0 mm Number of attempts: 1 Airway Equipment and Method: Stylet and Oral airway Placement Confirmation: ETT inserted through vocal cords under direct vision,  positive ETCO2 and breath sounds checked- equal and bilateral Secured at: 23 cm Tube secured with: Tape Dental Injury: Teeth and Oropharynx as per pre-operative assessment  Comments: Performed by Marguarite Arbour Rickerts SRNA

## 2020-04-03 NOTE — Anesthesia Procedure Notes (Signed)
Central Venous Catheter Insertion Performed by: Annye Asa, MD, anesthesiologist Start/End2/10/2020 7:41 AM, 04/03/2020 7:56 AM Patient location: Pre-op. Preanesthetic checklist: patient identified, IV checked, risks and benefits discussed, surgical consent, monitors and equipment checked, pre-op evaluation, timeout performed and anesthesia consent Position: supine Lidocaine 1% used for infiltration and patient sedated Hand hygiene performed , maximum sterile barriers used  and Seldinger technique used Catheter size: 8.5 Fr PA cath was placed.Sheath introducer Swan type:thermodilution Procedure performed using ultrasound guided technique. Ultrasound Notes:anatomy identified, needle tip was noted to be adjacent to the nerve/plexus identified, no ultrasound evidence of intravascular and/or intraneural injection and image(s) printed for medical record Attempts: 1 Following insertion, line sutured, dressing applied and Biopatch. Post procedure assessment: blood return through all ports, free fluid flow and no air  Patient tolerated the procedure well with no immediate complications. Additional procedure comments: PA catheter:  Routine monitors. Timeout, sterile prep, drape, FBP R neck.  Supine position.  1% Lido local, finder and trocar RIJ 1st pass with US guidance.  Cordis placed over J wire. PA catheter in easily.  Sterile dressing applied.  Patient tolerated well, VSS.  Jenita Seashore, MD.

## 2020-04-03 NOTE — Brief Op Note (Signed)
03/27/2020 - 04/03/2020  12:04 PM  PATIENT:  Curtis Watkins  65 y.o. male  PRE-OPERATIVE DIAGNOSIS:  CAD  POST-OPERATIVE DIAGNOSIS:  CAD  PROCEDURE:  Procedure(s) with comments: CORONARY ARTERY BYPASS GRAFTING (CABG) (N/A) - POSSIBLE BIMA possible RADIAL ARTERY HARVEST (Left) TRANSESOPHAGEAL ECHOCARDIOGRAM (TEE) (N/A) INDOCYANINE GREEN FLUORESCENCE IMAGING (ICG) (N/A) [place] LEFT RADIAL-OM1-OM3  SURGEON:  Surgeon(s) and Role:    * Linden Dolin, MD - Primary  PHYSICIAN ASSISTANT: WAYNE GOLD PA-C  ASSISTANTS: STAFF   ANESTHESIA:   general  EBL:  Per anes record   BLOOD ADMINISTERED:none  DRAINS: LEFT PLEURAL AND MEDIASTINAL CHEST TUBES   LOCAL MEDICATIONS USED:  NONE  SPECIMEN:  No Specimen  DISPOSITION OF SPECIMEN:  N/A  COUNTS:  YES  TOURNIQUET:  * No tourniquets in log *  DICTATION: .Dragon Dictation  PLAN OF CARE: Admit to inpatient   PATIENT DISPOSITION:  ICU - intubated and hemodynamically stable.   Delay start of Pharmacological VTE agent (>24hrs) due to surgical blood loss or risk of bleeding: yes  COMPLICATIONS: NO KNOWN

## 2020-04-03 NOTE — Op Note (Signed)
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  Lorayne Marek, MD  Assistant: Webb Laws, PA-C  Anesthesia: General  Operative Findings:  Mildly reduced left ventricular systolic function  Good quality left internal mammary artery conduit  Good quality radial artery conduit  Good quality target vessels for grafting    BRIEF CLINICAL NOTE AND INDICATIONS FOR SURGERY  64 year old gentleman presented with heart failure symptoms including shortness of breath and lower extremity edema.  His work-up suggested LV dysfunction.  He underwent left heart catheterization which demonstrated severe LAD disease and circumflex disease.  He was referred for surgery.  The patient been thoroughly evaluated and considered a good candidate.  He is taken the operating room for CABG.   DETAILS OF THE OPERATIVE PROCEDURE  Preparation:  The patient is brought to the operating room on the above mentioned date and central monitoring was established by the anesthesia team including placement of Swan-Ganz catheter and radial arterial line. The patient is placed in the supine position on the operating table.  Intravenous antibiotics are administered. General endotracheal anesthesia is induced uneventfully. A Foley catheter is placed.  Baseline transesophageal echocardiogram was performed.  Findings  were notable for mild to moderate mitral regurgitation and mildly reduced LV function  The patient's chest, abdomen, the left upper extremity, both groins, and both lower extremities are prepared and draped in a sterile manner. A time out procedure is performed.   Surgical Approach and Conduit Harvest:  A median sternotomy incision was performed and the left internal mammary artery is dissected from the chest wall and prepared for bypass grafting. The left internal mammary artery is notably good quality conduit. Simultaneously, open left radial artery harvesting is performed.  When the radial has been removed, the wound is closed in layers and the arm is retucked at the side following systemic heparinization, the left internal mammary artery was transected distally noted to have excellent flow.   Extracorporeal Cardiopulmonary Bypass and Myocardial Protection:  The pericardium is opened. The ascending aorta is nondiseased in appearance. The ascending aorta and the right atrium are cannulated for cardiopulmonary bypass.  Adequate heparinization is verified.    The entire pre-bypass portion of the operation was notable for stable hemodynamics.  Cardiopulmonary bypass was begun and the surface of the heart is inspected. Distal target vessels are selected for coronary artery bypass grafting. A cardioplegia cannula is placed in the ascending aorta.    The patient is allowed to cool passively to 34C systemic temperature.  The aortic cross clamp is applied and cold blood cardioplegia is delivered initially in an antegrade fashion through the aortic root.  ced saline slush is applied for topical hypothermia.  The initial cardioplegic arrest is rapid with early diastolic arrest.  Repeat doses of cardioplegia are administered intermittently throughout the entire cross clamp portion of the operation through the aortic root,and through subsequently placed vein grafts in order to maintain completely flat  electrocardiogram.  Coronary Artery Bypass Grafting:  The first and third obtuse marginal branches of the left circumflex coronary artery were grafted using the left radial artery graft ina sequenced fashion.  At the site of distal anastomosis the target vessels were good quality and measured approximately 1.5 mm in diameter.  The diagonal branch of the left anterior descending coronary artery and the LAD itself were grafted using the LIMA graft in a sequenced fashion.  At the site of distal anastomosis the target vessels were good quality and measured approximately 1.5 mm in diameter. Anastomotic patency and runoff was confirmed with indocyanine green fluorescence imaging (SPY).  The proximal radial graft anastomosis was placed directly to the ascending aorta prior to removal of the aortic cross clamp.  A hotshot dose of cardioplegia was given down the aortic root and the aortic cross-clamp was removed after de-airing procedures.   Procedure Completion:  All proximal and distal coronary anastomoses were inspected for hemostasis and appropriate graft orientation. Epicardial pacing wires are fixed to the right ventricular outflow tract and to the right atrial appendage. The patient is rewarmed to 37C temperature. The patient is weaned and disconnected from cardiopulmonary bypass.  The patient's rhythm at separation from bypass was sinus bradycardia.  The patient was weaned from cardiopulmonary bypass without any inotropic support.   Followup transesophageal echocardiogram performed after separation from bypass revealed no changes from the preoperative exam.  The aortic and venous cannula were removed uneventfully. Protamine was administered to reverse the anticoagulation. The mediastinum and pleural space were inspected for hemostasis and irrigated with saline solution. The mediastinum and both pleural spaces were drained using fluted chest tubes placed through separate stab incisions  inferiorly.  The soft tissues anterior to the aorta were reapproximated loosely. The sternum is closed with double strength sternal wire. The soft tissues anterior to the sternum were closed in multiple layers and the skin is closed with a running subcuticular skin closure.  The post-bypass portion of the operation was notable for stable rhythm and hemodynamics.     Disposition:  The patient tolerated the procedure well and is transported to the surgical intensive care in stable condition. There are no intraoperative complications. All sponge instrument and needle counts are verified correct at completion of the operation.    Brantley Fling, MD 04/03/2020 1:51 PM

## 2020-04-03 NOTE — Progress Notes (Signed)
Patient ID: Curtis Watkins, male   DOB: 12-08-1955, 65 y.o.   MRN: [id number]  TCTS Evening Rounds:   Hemodynamically stable  CI = 2.9 on milrinone 0.25  Just extubated  Urine output good  CT output low  CBC    Component Value Date/Time   WBC 7.7 04/03/2020 1352   RBC 2.91 (L) 04/03/2020 1352   HGB 8.8 (L) 04/03/2020 1352   HCT 27.0 (L) 04/03/2020 1352   PLT 112 (L) 04/03/2020 1352   MCV 92.8 04/03/2020 1352   MCH 30.2 04/03/2020 1352   MCHC 32.6 04/03/2020 1352   RDW 16.1 (H) 04/03/2020 1352     BMET    Component Value Date/Time   NA 143 04/03/2020 1233   K 4.2 04/03/2020 1233   CL 110 04/03/2020 1233   CO2 23 04/03/2020 0359   GLUCOSE 114 (H) 04/03/2020 1233   BUN 20 04/03/2020 1233   CREATININE 0.90 04/03/2020 1233   CALCIUM 8.5 (L) 04/03/2020 0359   GFRNONAA 56 (L) 04/03/2020 0359     A/P:  Stable postop course. Continue current plans

## 2020-04-03 NOTE — Hospital Course (Addendum)
History of Present Illness:      Mr. Jake Fuhrmann is a 65 year old male with past history significant for type 2 diabetes mellitus, hypertension, and tobacco use.  He presented to an urgent care facility earlier this week with a primary complaint of shortness of breath of about 2 days duration with associated lower extremity swelling.  He was referred on to the [facility], ED where he was seen on 03/27/2020.  Initial EKG showed no acute ischemic changes.  Chest x-ray showed small bilateral pleural effusions.  High-sensitivity troponin was measured at about 300 remained near that level on serial studies.  He was admitted by the hospitalist service with the diagnosis of new onset congestive heart failure.  He was started on IV Lasix and oral lisinopril and quickly had improvement in his symptoms.  Cardiology service was consulted and further work-up with left heart catheterization was recommended.  That study was performed earlier today. In the cath lab,  Dr. Katrinka Blazing attempted to advance the catheter from the right radial artery to engage the coronary arteries but was unable to advance past the axillary artery.  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  The right femoral artery was then cannulated and the team proceeded with their two-vessel disease including the LAD and complex disease in the circumflex coronary artery.  Left ventriculogram was consistent with ischemic cardiomyopathy with left-ventricular ejection fraction of 35 to 40%.     We have been asked to evaluate Mr. Ekstein for consideration of surgical coronary revascularization.   Mr. Caspers was noted on initial assessment in the emergency room to also have significant diabetic foot ulcerations bilaterally.  X-rays of both feet have confirmed osteomyelitis in the distal portion of the right second and third toes.  He also has large chronic calluses Dr. Lajoyce Corners has been consulted.  He does not feel that he is a  cellulitis or abscess and has recommended no surgical intervention at this time.  He will follow-up with Mr. Soffer as an outpatient.  The patient and all relative studies were reviewed by Dr. Vickey Sages who recommended proceeding with coronary artery surgical revascularization.  Hospital course:  Patient was taken the operating room on 04/03/2020 at which time he underwent CABG x4 with sequential LIMA to diagonal-LAD and a sequential left radial to OM1-OM 3.  He tolerated the procedure well and was taken to the surgical intensive care unit in stable condition.  He was extubated the evening of surgery without difficulty.  He was weaned off Milrinone as hemodynamics allowed.  His chest tubes and arterial lines were removed without difficulty. The patient has diabetic foot ulcers.  Wound care consult was placed and orders were placed to help with these wounds.  He will require outpatient follow up with Dr. Lajoyce Corners.  He remains in NSR.  His pacing wires were removed without difficulty.  His blood sugars have been well controlled. His surgical incisions are healing without evidence of infection.  He is ambulating without difficulty.  He is medically stable for discharge home today.

## 2020-04-03 NOTE — H&P (Signed)
History and Physical Interval Note:  04/03/2020 7:40 AM  Curtis Watkins  has presented today for surgery, with the diagnosis of CAD.  The various methods of treatment have been discussed with the patient and family. After consideration of risks, benefits and other options for treatment, the patient has consented to  Procedure(s) with comments: CORONARY ARTERY BYPASS GRAFTING (CABG) (N/A) - POSSIBLE BIMA possible RADIAL ARTERY HARVEST (Left) TRANSESOPHAGEAL ECHOCARDIOGRAM (TEE) (N/A) INDOCYANINE GREEN FLUORESCENCE IMAGING (ICG) (N/A) as a surgical intervention.  The patient's history has been reviewed, patient examined, no change in status, stable for surgery.  I have reviewed the patient's chart and labs.  Questions were answered to the patient's satisfaction.     Linden Dolin

## 2020-04-03 NOTE — Anesthesia Procedure Notes (Signed)
Arterial Line Insertion Start/End2/10/2020 7:53 AM Performed by: Alease Medina, CRNA  Patient location: Pre-op. Preanesthetic checklist: patient identified, IV checked, site marked, risks and benefits discussed, surgical consent, monitors and equipment checked, pre-op evaluation, timeout performed and anesthesia consent Lidocaine 1% used for infiltration Right, radial was placed Catheter size: 20 Fr Hand hygiene performed  and maximum sterile barriers used   Attempts: 1 Procedure performed without using ultrasound guided technique. Following insertion, dressing applied and Biopatch. Post procedure assessment: normal and unchanged  Patient tolerated the procedure well with no immediate complications. Additional procedure comments: Performed by Shauna Rickards SRNA.

## 2020-04-04 ENCOUNTER — Encounter (HOSPITAL_COMMUNITY): Payer: Self-pay | Admitting: Cardiothoracic Surgery

## 2020-04-04 ENCOUNTER — Inpatient Hospital Stay (HOSPITAL_COMMUNITY): Payer: Self-pay

## 2020-04-04 LAB — BASIC METABOLIC PANEL
Anion gap: 10 (ref 5–15)
Anion gap: 8 (ref 5–15)
BUN: 22 mg/dL (ref 8–23)
BUN: 23 mg/dL (ref 8–23)
CO2: 18 mmol/L — ABNORMAL LOW (ref 22–32)
CO2: 20 mmol/L — ABNORMAL LOW (ref 22–32)
Calcium: 8 mg/dL — ABNORMAL LOW (ref 8.9–10.3)
Calcium: 8.1 mg/dL — ABNORMAL LOW (ref 8.9–10.3)
Chloride: 109 mmol/L (ref 98–111)
Chloride: 106 mmol/L (ref 98–111)
Creatinine, Ser: 1.38 mg/dL — ABNORMAL HIGH (ref 0.61–1.24)
Creatinine, Ser: 1.52 mg/dL — ABNORMAL HIGH (ref 0.61–1.24)
GFR, Estimated: 57 mL/min — ABNORMAL LOW (ref >60)
GFR, Estimated: 51 mL/min — ABNORMAL LOW (ref >60)
Glucose, Bld: 130 mg/dL — ABNORMAL HIGH (ref 70–99)
Glucose, Bld: 151 mg/dL — ABNORMAL HIGH (ref 70–99)
Potassium: 4.4 mmol/L (ref 3.5–5.1)
Potassium: 5.1 mmol/L (ref 3.5–5.1)
Sodium: 137 mmol/L (ref 135–145)
Sodium: 134 mmol/L — ABNORMAL LOW (ref 135–145)

## 2020-04-04 LAB — CBC
HCT: 25.6 % — ABNORMAL LOW (ref 39.0–52.0)
HCT: 24.8 % — ABNORMAL LOW (ref 39.0–52.0)
Hemoglobin: 8.5 g/dL — ABNORMAL LOW (ref 13.0–17.0)
Hemoglobin: 8.3 g/dL — ABNORMAL LOW (ref 13.0–17.0)
MCH: 30.5 pg (ref 26.0–34.0)
MCH: 31 pg (ref 26.0–34.0)
MCHC: 33.2 g/dL (ref 30.0–36.0)
MCHC: 33.5 g/dL (ref 30.0–36.0)
MCV: 91.8 fL (ref 80.0–100.0)
MCV: 92.5 fL (ref 80.0–100.0)
Platelets: 128 10*3/uL — ABNORMAL LOW (ref 150–400)
Platelets: 137 10*3/uL — ABNORMAL LOW (ref 150–400)
RBC: 2.79 MIL/uL — ABNORMAL LOW (ref 4.22–5.81)
RBC: 2.68 MIL/uL — ABNORMAL LOW (ref 4.22–5.81)
RDW: 16.6 % — ABNORMAL HIGH (ref 11.5–15.5)
RDW: 16.8 % — ABNORMAL HIGH (ref 11.5–15.5)
WBC: 10 10*3/uL (ref 4.0–10.5)
WBC: 12.6 10*3/uL — ABNORMAL HIGH (ref 4.0–10.5)
nRBC: 0 % (ref 0.0–0.2)
nRBC: 0 % (ref 0.0–0.2)

## 2020-04-04 LAB — GLUCOSE, CAPILLARY
Glucose-Capillary: 117 mg/dL — ABNORMAL HIGH (ref 70–99)
Glucose-Capillary: 130 mg/dL — ABNORMAL HIGH (ref 70–99)
Glucose-Capillary: 107 mg/dL — ABNORMAL HIGH (ref 70–99)
Glucose-Capillary: 119 mg/dL — ABNORMAL HIGH (ref 70–99)
Glucose-Capillary: 101 mg/dL — ABNORMAL HIGH (ref 70–99)
Glucose-Capillary: 111 mg/dL — ABNORMAL HIGH (ref 70–99)
Glucose-Capillary: 132 mg/dL — ABNORMAL HIGH (ref 70–99)
Glucose-Capillary: 81 mg/dL (ref 70–99)
Glucose-Capillary: 191 mg/dL — ABNORMAL HIGH (ref 70–99)

## 2020-04-04 LAB — COOXEMETRY PANEL
Carboxyhemoglobin: 1.2 % (ref 0.5–1.5)
Methemoglobin: 1 % (ref 0.0–1.5)
O2 Saturation: 72.9 %
Total hemoglobin: 8.9 g/dL — ABNORMAL LOW (ref 12.0–16.0)

## 2020-04-04 LAB — MAGNESIUM
Magnesium: 2.6 mg/dL — ABNORMAL HIGH (ref 1.7–2.4)
Magnesium: 2.6 mg/dL — ABNORMAL HIGH (ref 1.7–2.4)

## 2020-04-04 MED ORDER — COLCHICINE 0.3 MG HALF TABLET
0.3000 mg | ORAL_TABLET | Freq: Two times a day (BID) | ORAL | Status: DC
  Administered 2020-04-04 – 2020-04-07 (×7): 0.3 mg via ORAL
  Filled 2020-04-04 (×9): qty 1

## 2020-04-04 MED ORDER — DIAZEPAM 2 MG PO TABS
2.0000 mg | ORAL_TABLET | Freq: Three times a day (TID) | ORAL | Status: AC
  Administered 2020-04-04 – 2020-04-05 (×3): 2 mg via ORAL
  Filled 2020-04-04 (×3): qty 1

## 2020-04-04 MED ORDER — CLOPIDOGREL BISULFATE 75 MG PO TABS
75.0000 mg | ORAL_TABLET | Freq: Every day | ORAL | Status: DC
  Administered 2020-04-04 – 2020-04-07 (×4): 75 mg via ORAL
  Filled 2020-04-04 (×4): qty 1

## 2020-04-04 MED ORDER — INSULIN ASPART 100 UNIT/ML ~~LOC~~ SOLN
0.0000 [IU] | Freq: Three times a day (TID) | SUBCUTANEOUS | Status: DC
  Administered 2020-04-05: 3 [IU] via SUBCUTANEOUS
  Administered 2020-04-06: 2 [IU] via SUBCUTANEOUS
  Administered 2020-04-06: 3 [IU] via SUBCUTANEOUS

## 2020-04-04 MED ORDER — ISOSORBIDE DINITRATE 10 MG PO TABS
10.0000 mg | ORAL_TABLET | Freq: Three times a day (TID) | ORAL | Status: DC
  Administered 2020-04-04 – 2020-04-07 (×9): 10 mg via ORAL
  Filled 2020-04-04 (×10): qty 1

## 2020-04-04 MED ORDER — INSULIN ASPART 100 UNIT/ML ~~LOC~~ SOLN
0.0000 [IU] | SUBCUTANEOUS | Status: AC
  Administered 2020-04-04: 1 [IU] via SUBCUTANEOUS
  Administered 2020-04-04: 2 [IU] via SUBCUTANEOUS
  Administered 2020-04-05: 3 [IU] via SUBCUTANEOUS
  Administered 2020-04-05 (×3): 1 [IU] via SUBCUTANEOUS

## 2020-04-04 MED ORDER — THIAMINE HCL 100 MG/ML IJ SOLN
Freq: Once | INTRAVENOUS | Status: AC
  Filled 2020-04-04: qty 1000

## 2020-04-04 MED FILL — Potassium Chloride Inj 2 mEq/ML: INTRAVENOUS | Qty: 40 | Status: AC

## 2020-04-04 MED FILL — Magnesium Sulfate Inj 50%: INTRAMUSCULAR | Qty: 10 | Status: AC

## 2020-04-04 MED FILL — Heparin Sodium (Porcine) Inj 1000 Unit/ML: INTRAMUSCULAR | Qty: 30 | Status: AC

## 2020-04-04 NOTE — Progress Notes (Signed)
Inpatient Diabetes Program Recommendations  AACE/ADA: New Consensus Statement on Inpatient Glycemic Control (2015)  Target Ranges:  Prepandial:   less than 140 mg/dL      Peak postprandial:   less than 180 mg/dL (1-2 hours)      Critically ill patients:  140 - 180 mg/dL   Lab Results  Component Value Date   GLUCAP 101 (H) 04/04/2020   HGBA1C 6.8 (H) 03/28/2020    Review of Glycemic Control  Diabetes history: DM2 Outpatient Diabetes medications: Metformin 1 gm qd Current orders for Inpatient glycemic control: IV insulin per endotool  Inpatient Diabetes Program Recommendations:   Received consult regarding insulin transition needs. -When ready to transition: Start Novolog correction q 4 hrs. 0-9 units x 24 hrs. Then 0-9 units tid + hs 0-5.  Thank you, Billy Fischer. Shelena Castelluccio, RN, MSN, CDE  Diabetes Coordinator Inpatient Glycemic Control Team Team Pager [phone] (8am-5pm) 04/04/2020 11:26 AM

## 2020-04-04 NOTE — Discharge Instructions (Signed)

## 2020-04-04 NOTE — Progress Notes (Signed)
1 Day Post-Op Procedure(s) (LRB): CORONARY ARTERY BYPASS GRAFTING (CABG) TIMES FOUR USING LEFT INTERNAL MAMMARY ARTERY AND LEFT RADIAL ARTERY. (N/A) LEFT RADIAL ARTERY HARVEST (Left) TRANSESOPHAGEAL ECHOCARDIOGRAM (TEE) (N/A) INDOCYANINE GREEN FLUORESCENCE IMAGING (ICG) (N/A) INSERTION OF FEMORAL ARTERIAL LINE (Left) Subjective: No complaints  Objective: Vital signs in last 24 hours: Temp:  [96.6 F (35.9 C)-98.5 F (36.9 C)] 98.5 F (36.9 C) (02/10 1600) Pulse Rate:  [72-81] 76 (02/10 1600) Cardiac Rhythm: Normal sinus rhythm (02/10 1600) Resp:  [0-24] 19 (02/10 1600) BP: (81-164)/(53-79) 92/64 (02/10 1600) SpO2:  [99 %-100 %] 100 % (02/10 1600) Arterial Line BP: (111-158)/(38-67) 149/49 (02/10 0900) FiO2 (%):  [40 %] 40 % (02/09 1818) Weight:  [81.6 kg] 81.6 kg (02/10 0500)  Hemodynamic parameters for last 24 hours: PAP: (6-27)/(3-17) 17/11 CO:  [5.8 L/min-7.7 L/min] 7.2 L/min CI:  [2.9 L/min/m2-3.9 L/min/m2] 3.6 L/min/m2  Intake/Output from previous day: 02/09 0701 - 02/10 0700 In: 6265.6 [I.V.:3285.2; Blood:655; IV Piggyback:2325.5] Out: 3035 [Urine:1580; Blood:705; Chest Tube:750] Intake/Output this shift: Total I/O In: 335.3 [I.V.:235.3; IV Piggyback:100] Out: 555 [Urine:345; Chest Tube:210]  General appearance: alert and cooperative Neurologic: intact Heart: regular rate and rhythm, S1, S2 normal, no murmur, click, rub or gallop Lungs: clear to auscultation bilaterally Abdomen: soft, non-tender; bowel sounds normal; no masses,  no organomegaly Extremities: extremities normal, atraumatic, no cyanosis or edema Wound: c/d/i  Lab Results: Recent Labs    04/04/20 0300 04/04/20 1544  WBC 10.0 12.6*  HGB 8.5* 8.3*  HCT 25.6* 24.8*  PLT 128* 137*   BMET:  Recent Labs    04/04/20 0300 04/04/20 1544  NA 137 134*  K 4.4 5.1  CL 109 106  CO2 18* 20*  GLUCOSE 130* 151*  BUN 22 23  CREATININE 1.38* 1.52*  CALCIUM 8.0* 8.1*    PT/INR:  Recent Labs     04/03/20 1352  LABPROT 16.9*  INR 1.4*   ABG    Component Value Date/Time   PHART 7.332 (L) 04/03/2020 2009   HCO3 19.7 (L) 04/03/2020 2009   TCO2 21 (L) 04/03/2020 2009   ACIDBASEDEF 6.0 (H) 04/03/2020 2009   O2SAT 72.9 04/04/2020 0305   CBG (last 3)  Recent Labs    04/04/20 0918 04/04/20 1150 04/04/20 1317  GLUCAP 111* 132* 81    Assessment/Plan: S/P Procedure(s) (LRB): CORONARY ARTERY BYPASS GRAFTING (CABG) TIMES FOUR USING LEFT INTERNAL MAMMARY ARTERY AND LEFT RADIAL ARTERY. (N/A) LEFT RADIAL ARTERY HARVEST (Left) TRANSESOPHAGEAL ECHOCARDIOGRAM (TEE) (N/A) INDOCYANINE GREEN FLUORESCENCE IMAGING (ICG) (N/A) INSERTION OF FEMORAL ARTERIAL LINE (Left) Mobilize gentle resuscitation   LOS: 8 days    Curtis Watkins 04/04/2020

## 2020-04-04 NOTE — Consult Note (Signed)
WOC Nurse Consult Note: Patient receiving care in [facility] 2H9.  Patient seen by L. McNichol on 2/3 and Dr. Jonathon Bellows on 2/4. Reason for Consult: "diabetic foot ulcers" Orders for care of the feet are under the Eucerin on the MAR. No new needs identified. I spoke with primary RN, Zollie Scale, via telephone about the consult today. Helmut Muster, RN, MSN, CWOCN, CNS-BC, pager [phone]

## 2020-04-04 NOTE — Plan of Care (Signed)

## 2020-04-05 ENCOUNTER — Inpatient Hospital Stay (HOSPITAL_COMMUNITY): Payer: Self-pay

## 2020-04-05 LAB — ECHO INTRAOPERATIVE TEE
Height: 71 in
S' Lateral: 4.45 cm
Weight: 2771.2 oz

## 2020-04-05 LAB — BASIC METABOLIC PANEL
Anion gap: 6 (ref 5–15)
BUN: 24 mg/dL — ABNORMAL HIGH (ref 8–23)
CO2: 22 mmol/L (ref 22–32)
Calcium: 7.8 mg/dL — ABNORMAL LOW (ref 8.9–10.3)
Chloride: 104 mmol/L (ref 98–111)
Creatinine, Ser: 1.39 mg/dL — ABNORMAL HIGH (ref 0.61–1.24)
GFR, Estimated: 57 mL/min — ABNORMAL LOW (ref >60)
Glucose, Bld: 135 mg/dL — ABNORMAL HIGH (ref 70–99)
Potassium: 4.7 mmol/L (ref 3.5–5.1)
Sodium: 132 mmol/L — ABNORMAL LOW (ref 135–145)

## 2020-04-05 LAB — CBC
HCT: 23.4 % — ABNORMAL LOW (ref 39.0–52.0)
Hemoglobin: 8.1 g/dL — ABNORMAL LOW (ref 13.0–17.0)
MCH: 32 pg (ref 26.0–34.0)
MCHC: 34.6 g/dL (ref 30.0–36.0)
MCV: 92.5 fL (ref 80.0–100.0)
Platelets: 124 10*3/uL — ABNORMAL LOW (ref 150–400)
RBC: 2.53 MIL/uL — ABNORMAL LOW (ref 4.22–5.81)
RDW: 16.5 % — ABNORMAL HIGH (ref 11.5–15.5)
WBC: 10.7 10*3/uL — ABNORMAL HIGH (ref 4.0–10.5)
nRBC: 0 % (ref 0.0–0.2)

## 2020-04-05 LAB — GLUCOSE, CAPILLARY
Glucose-Capillary: 124 mg/dL — ABNORMAL HIGH (ref 70–99)
Glucose-Capillary: 132 mg/dL — ABNORMAL HIGH (ref 70–99)
Glucose-Capillary: 142 mg/dL — ABNORMAL HIGH (ref 70–99)
Glucose-Capillary: 144 mg/dL — ABNORMAL HIGH (ref 70–99)
Glucose-Capillary: 216 mg/dL — ABNORMAL HIGH (ref 70–99)
Glucose-Capillary: 220 mg/dL — ABNORMAL HIGH (ref 70–99)
Glucose-Capillary: 224 mg/dL — ABNORMAL HIGH (ref 70–99)

## 2020-04-05 LAB — COOXEMETRY PANEL
Carboxyhemoglobin: 1.4 % (ref 0.5–1.5)
Methemoglobin: 1.1 % (ref 0.0–1.5)
O2 Saturation: 67 %
Total hemoglobin: 8.9 g/dL — ABNORMAL LOW (ref 12.0–16.0)

## 2020-04-05 MED ORDER — ACETAMINOPHEN 500 MG PO TABS: 1000.0000 mg | ORAL_TABLET | Freq: Four times a day (QID) | ORAL | 0 refills | Status: DC | PRN

## 2020-04-05 MED ORDER — FE FUMARATE-B12-VIT C-FA-IFC PO CAPS
1.0000 | ORAL_CAPSULE | Freq: Two times a day (BID) | ORAL | Status: DC
  Administered 2020-04-05 – 2020-04-07 (×5): 1 via ORAL
  Filled 2020-04-05 (×5): qty 1

## 2020-04-05 MED ORDER — ASPIRIN 325 MG PO TBEC: 325.0000 mg | DELAYED_RELEASE_TABLET | Freq: Every day | ORAL | 0 refills | Status: DC

## 2020-04-05 NOTE — Progress Notes (Signed)
TCTS BRIEF SICU PROGRESS NOTE  2 Days Post-Op  S/P Procedure(s) (LRB): CORONARY ARTERY BYPASS GRAFTING (CABG) TIMES FOUR USING LEFT INTERNAL MAMMARY ARTERY AND LEFT RADIAL ARTERY. (N/A) LEFT RADIAL ARTERY HARVEST (Left) TRANSESOPHAGEAL ECHOCARDIOGRAM (TEE) (N/A) INDOCYANINE GREEN FLUORESCENCE IMAGING (ICG) (N/A) INSERTION OF FEMORAL ARTERIAL LINE (Left)   Stable day NSR w/ stable BP Breathing comfortably on room air UOP adequate  Plan: Continue current plan  Purcell Nails, MD 04/05/2020 5:59 PM

## 2020-04-05 NOTE — Progress Notes (Signed)
04/05/2020 2000 Received pt to room 4E06 from 2H.  Pt is A&O, no C/O voiced.  Tele monitor applied and CCMD notified.  CHG bath given.  Oriented to room, call light and bed.  Call bell in reach. Kathryne Hitch

## 2020-04-05 NOTE — Discharge Summary (Signed)
[address]       [place] [address]             [phone]    Physician Discharge Summary  Patient ID: Curtis Watkins MRN: [id number] DOB/AGE: [birth year] 65 y.o.  Admit date: 03/27/2020 Discharge date: 04/07/2020  Admission Diagnoses:  Patient Active Problem List   Diagnosis Date Noted  . Coronary artery disease involving native coronary artery of native heart with unstable angina pectoris (HCC)   . Acute congestive heart failure (HCC) 03/27/2020  . Diabetic foot ulcers (HCC) 03/27/2020  . DM2 (diabetes mellitus, type 2) (HCC) 03/27/2020  . HTN (hypertension) 03/27/2020   Discharge Diagnoses:  Patient Active Problem List   Diagnosis Date Noted  . 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 Coronary artery disease involving native coronary artery of native heart with unstable angina pectoris (HCC)   . Acute congestive heart failure (HCC) 03/27/2020  . Diabetic foot ulcers (HCC) 03/27/2020  . DM2 (diabetes mellitus, type 2) (HCC) 03/27/2020  . HTN (hypertension) 03/27/2020   Discharged Condition: good  History of Present Illness:      [name] is a 65 year old male with past history significant for type 2 diabetes mellitus, hypertension, and tobacco use.  He presented to an urgent care facility earlier this week with a primary complaint of shortness of breath of about 2 days duration with associated lower extremity swelling.  He was referred on to the [facility], ED where he was seen on 03/27/2020.  Initial EKG showed no acute ischemic changes.  Chest x-ray showed small bilateral pleural effusions.  High-sensitivity troponin was measured at about 300 remained near that level on serial studies.  He was admitted by the hospitalist service with the diagnosis of new onset congestive heart failure.  He was started on IV Lasix and oral lisinopril and quickly had improvement in his symptoms.  Cardiology service was consulted and further work-up with left heart catheterization was  recommended.  That study was performed earlier today. In the cath lab,  Dr. Katrinka Blazing attempted to advance the catheter from the right radial artery to engage the coronary arteries but was unable to advance past the axillary artery.  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  The right femoral artery was then cannulated and the team proceeded with their two-vessel disease including the LAD and complex disease in the circumflex coronary artery.  Left ventriculogram was consistent with ischemic cardiomyopathy with left-ventricular ejection fraction of 35 to 40%.     We have been asked to evaluate Curtis Watkins for consideration of surgical coronary revascularization.   Curtis Watkins was noted on initial assessment in the emergency room to also have significant diabetic foot ulcerations bilaterally.  X-rays of both feet have confirmed osteomyelitis in the distal portion of the right second and third toes.  He also has large chronic calluses Dr. Lajoyce Corners has been consulted.  He does not feel that he is a cellulitis or abscess and has recommended no surgical intervention at this time.  He will follow-up with Curtis Watkins as an outpatient.  The patient and all relative studies were reviewed by Dr. Vickey Sages who recommended proceeding with coronary artery surgical revascularization.  Hospital course:  Patient was taken the operating room on 04/03/2020 at which time he underwent CABG x4 with sequential LIMA to diagonal-LAD and a sequential left radial to OM1-OM 3.  He tolerated the procedure well and was taken to  the surgical intensive care unit in stable condition.  He was extubated the evening of surgery without difficulty.  He was weaned off Milrinone as hemodynamics allowed.  His chest tubes and arterial lines were removed without difficulty. The patient has diabetic foot ulcers.  Wound care consult was placed and orders were placed to help with these wounds.  He will require  outpatient follow up with Dr. Lajoyce Corners.  He remains in NSR.  His pacing wires were removed without difficulty.  Epicardial pacing wires were removed on 02/12. His blood sugars have been well controlled. He will be restarted on Metformin at discharge. His surgical incisions are healing without evidence of infection.  He is ambulating without difficulty.  He has been tolerating a diet and required a few laxative to help him with constipation. He is medically stable for discharge home today.    Consults: diabetes coordinator, wound care, orthopedics  Significant Diagnostic Studies: angiography:    Diabetic with severe two-vessel disease including the LAD and complex disease in the circumflex.  Ischemic cardiomyopathy with LVEF 35 to 40%.  LVEDP is less than 10 mmHg.  Left main is widely patent  Right coronary is widely patent but contains diffuse atherosclerosis without focal narrowing.  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     Treatments: surgery:    Coronary Artery Bypass Grafting x 4             Left Internal Mammary Artery to Distal Left Anterior Descending Coronary Artery and diagonal Branch Coronary Artery as a sequenced graft; left radial artery to third obtuse Marginal Branch of Left Circumflex Coronary Artery and first obtuse marginal branch of the left circumflex is a sequenced graft  Completion graft surveillance with indocyanine green fluorescence angiography Open left radial artery harvesting   Discharge Exam: Blood pressure 99/68, pulse 84, temperature 98.7 F (37.1 C), temperature source Oral, resp. rate 19, height 5\' 11"  (1.803 m), weight 82.1 kg, SpO2 99 %. Cardiovascular: RRR Pulmonary: Slightly diminished bibasilar breath sounds Abdomen: Soft, non tender, bowel sounds present. Extremities: Trace bilateral lower extremity edema. Motor/sensory intact LUE Wounds:Sternal wound is clean and dry.  No erythema or signs of infection. LUE dressing removed and wound  is clean and dry   Discharge Medications:   Discharge Instructions    Amb Referral to Cardiac Rehabilitation   Complete by: As directed    Diagnosis: CABG   CABG X ___: 4   After initial evaluation and assessments completed: Virtual Based Care may be provided alone or in conjunction with Phase 2 Cardiac Rehab based on patient barriers.: Yes     Allergies as of 04/07/2020   No Known Allergies     Medication List    STOP taking these medications   hydrochlorothiazide 25 MG tablet Commonly known as: HYDRODIURIL     TAKE these medications   acetaminophen 500 MG tablet Commonly known as: TYLENOL Take 2 tablets (1,000 mg total) by mouth every 6 (six) hours as needed.   aspirin EC 81 MG tablet Take 1 tablet (81 mg total) by mouth daily.   atorvastatin 80 MG tablet Commonly known as: LIPITOR Take 1 tablet (80 mg total) by mouth daily.   clopidogrel 75 MG tablet Commonly known as: PLAVIX Take 1 tablet (75 mg total) by mouth daily.   colchicine 0.6 MG tablet Take 0.5 tablets (0.3 mg total) by mouth 2 (two) times daily.   ferrous sulfate 325 (65 FE) MG tablet Take 1 tablet (325 mg total) by  mouth daily. For one month then stop. If develops constipation, may take stool softener or laxative. May stop sooner if develops nausea or further constipation   furosemide 20 MG tablet Commonly known as: Lasix Take 1 tablet (20 mg total) by mouth daily. For 5 days then stop.   hydrocerin Crea Apply 113 application topically daily.   isosorbide dinitrate 10 MG tablet Commonly known as: ISORDIL Take 1 tablet (10 mg total) by mouth 3 (three) times daily.   metFORMIN 1000 MG tablet Commonly known as: GLUCOPHAGE Take 1,000 mg by mouth daily with breakfast.   metoprolol tartrate 25 MG tablet Commonly known as: LOPRESSOR Take 0.5 tablets (12.5 mg total) by mouth 2 (two) times daily.   oxyCODONE 5 MG immediate release tablet Commonly known as: Oxy IR/ROXICODONE Take 1 tablet (5 mg  total) by mouth every 6 (six) hours as needed for severe pain.   potassium chloride 10 MEQ tablet Commonly known as: KLOR-CON Take 1 tablet (10 mEq total) by mouth daily. For 5 days then stop.      The patient has been discharged on:   1.Beta Blocker:  Yes [ X  ]                              No   [   ]                              If No, reason:  2.Ace Inhibitor/ARB: Yes [   ]                                     No  [ x   ]                                     If No, reason:Labile BP  3.Statin:   Yes [ X  ]                  No  [   ]                  If No, reason:  4.Ecasa:  Yes  Arly.Keller   ]                  No   [   ]                  If No, reason:     Follow-up Information    Nadara Mustard, MD Follow up in 1 week(s).   Specialty: Orthopedic Surgery Contact information: [address] [place] [address] [phone]        Linden Dolin, MD Follow up on 04/22/2020.   Specialty: Cardiothoracic Surgery Why: Appointment is at 11:30 Contact information: [address] [place] [address] [phone]        Ronney Asters, NP Follow up on 04/19/2020.   Specialty: Cardiology Why: Appointment is at 11:45 Contact information: [address] [place] [address] [phone]               Signed: @mec @ 04/07/2020, 10:52 AM

## 2020-04-05 NOTE — Progress Notes (Signed)
2 Days Post-Op Procedure(s) (LRB): CORONARY ARTERY BYPASS GRAFTING (CABG) TIMES FOUR USING LEFT INTERNAL MAMMARY ARTERY AND LEFT RADIAL ARTERY. (N/A) LEFT RADIAL ARTERY HARVEST (Left) TRANSESOPHAGEAL ECHOCARDIOGRAM (TEE) (N/A) INDOCYANINE GREEN FLUORESCENCE IMAGING (ICG) (N/A) INSERTION OF FEMORAL ARTERIAL LINE (Left) Subjective: hungry  Objective: Vital signs in last 24 hours: Temp:  [98.2 F (36.8 C)-99 F (37.2 C)] 98.2 F (36.8 C) (02/11 0746) Pulse Rate:  [72-84] 80 (02/11 0700) Cardiac Rhythm: Normal sinus rhythm;Heart block (02/11 0400) Resp:  [15-27] 15 (02/11 0700) BP: (82-137)/(53-83) 97/71 (02/11 0700) SpO2:  [95 %-100 %] 100 % (02/11 0700) Arterial Line BP: (149)/(49) 149/49 (02/10 0900) Weight:  [82.9 kg] 82.9 kg (02/11 0500)  Hemodynamic parameters for last 24 hours: PAP: (17)/(11) 17/11  Intake/Output from previous day: 02/10 0701 - 02/11 0700 In: 1648.7 [P.O.:120; I.V.:1328.7; IV Piggyback:200] Out: 1575 [Urine:1145; Chest Tube:430] Intake/Output this shift: No intake/output data recorded.  General appearance: alert and cooperative Neurologic: intact Heart: regular rate and rhythm, S1, S2 normal, no murmur, click, rub or gallop Lungs: clear to auscultation bilaterally Abdomen: soft, non-tender; bowel sounds normal; no masses,  no organomegaly Extremities: extremities normal, atraumatic, no cyanosis or edema Wound: c/d/i  Lab Results: Recent Labs    04/04/20 1544 04/05/20 0214  WBC 12.6* 10.7*  HGB 8.3* 8.1*  HCT 24.8* 23.4*  PLT 137* 124*   BMET:  Recent Labs    04/04/20 1544 04/05/20 0214  NA 134* 132*  K 5.1 4.7  CL 106 104  CO2 20* 22  GLUCOSE 151* 135*  BUN 23 24*  CREATININE 1.52* 1.39*  CALCIUM 8.1* 7.8*    PT/INR:  Recent Labs    04/03/20 1352  LABPROT 16.9*  INR 1.4*   ABG    Component Value Date/Time   PHART 7.332 (L) 04/03/2020 2009   HCO3 19.7 (L) 04/03/2020 2009   TCO2 21 (L) 04/03/2020 2009   ACIDBASEDEF 6.0  (H) 04/03/2020 2009   O2SAT 67.0 04/05/2020 0210   CBG (last 3)  Recent Labs    04/05/20 0006 04/05/20 0358 04/05/20 0748  GLUCAP 124* 132* 216*    Assessment/Plan: S/P Procedure(s) (LRB): CORONARY ARTERY BYPASS GRAFTING (CABG) TIMES FOUR USING LEFT INTERNAL MAMMARY ARTERY AND LEFT RADIAL ARTERY. (N/A) LEFT RADIAL ARTERY HARVEST (Left) TRANSESOPHAGEAL ECHOCARDIOGRAM (TEE) (N/A) INDOCYANINE GREEN FLUORESCENCE IMAGING (ICG) (N/A) INSERTION OF FEMORAL ARTERIAL LINE (Left) Mobilize Diuresis See progression orders  Advance diet   LOS: 9 days    Curtis Watkins 04/05/2020

## 2020-04-05 NOTE — Plan of Care (Signed)
  Problem: Activity: Goal: Capacity to carry out activities will improve Outcome: Progressing   Problem: Cardiac: Goal: Ability to achieve and maintain adequate cardiopulmonary perfusion will improve Outcome: Progressing   Problem: Education: Goal: Knowledge of General Education information will improve Description: Including pain rating scale, medication(s)/side effects and non-pharmacologic comfort measures Outcome: Progressing   Problem: Clinical Measurements: Goal: Ability to maintain clinical measurements within normal limits will improve Outcome: Progressing Goal: Will remain free from infection Outcome: Progressing Goal: Diagnostic test results will improve Outcome: Progressing Goal: Respiratory complications will improve Outcome: Progressing Goal: Cardiovascular complication will be avoided Outcome: Progressing   Problem: Activity: Goal: Risk for activity intolerance will decrease Outcome: Progressing   Problem: Nutrition: Goal: Adequate nutrition will be maintained Outcome: Progressing   Problem: Coping: Goal: Level of anxiety will decrease Outcome: Progressing   Problem: Elimination: Goal: Will not experience complications related to bowel motility Outcome: Progressing Goal: Will not experience complications related to urinary retention Outcome: Progressing   Problem: Pain Managment: Goal: General experience of comfort will improve Outcome: Progressing   Problem: Safety: Goal: Ability to remain free from injury will improve Outcome: Progressing   Problem: Skin Integrity: Goal: Risk for impaired skin integrity will decrease Outcome: Progressing

## 2020-04-06 ENCOUNTER — Inpatient Hospital Stay (HOSPITAL_COMMUNITY): Payer: Self-pay

## 2020-04-06 DIAGNOSIS — N1831 Chronic kidney disease, stage 3a: Secondary | ICD-10-CM

## 2020-04-06 DIAGNOSIS — E1122 Type 2 diabetes mellitus with diabetic chronic kidney disease: Secondary | ICD-10-CM

## 2020-04-06 LAB — TYPE AND SCREEN
ABO/RH(D): O POS
Antibody Screen: NEGATIVE
Unit division: 0
Unit division: 0

## 2020-04-06 LAB — BPAM RBC
Blood Product Expiration Date: 202203102359
Blood Product Expiration Date: 202203102359
ISSUE DATE / TIME: 202202090745
ISSUE DATE / TIME: 202202090745
Unit Type and Rh: 5100
Unit Type and Rh: 5100

## 2020-04-06 LAB — CBC
HCT: 25.1 % — ABNORMAL LOW (ref 39.0–52.0)
Hemoglobin: 8.3 g/dL — ABNORMAL LOW (ref 13.0–17.0)
MCH: 30.2 pg (ref 26.0–34.0)
MCHC: 33.1 g/dL (ref 30.0–36.0)
MCV: 91.3 fL (ref 80.0–100.0)
Platelets: 147 10*3/uL — ABNORMAL LOW (ref 150–400)
RBC: 2.75 MIL/uL — ABNORMAL LOW (ref 4.22–5.81)
RDW: 15.9 % — ABNORMAL HIGH (ref 11.5–15.5)
WBC: 11.1 10*3/uL — ABNORMAL HIGH (ref 4.0–10.5)
nRBC: 0 % (ref 0.0–0.2)

## 2020-04-06 LAB — BASIC METABOLIC PANEL
Anion gap: 6 (ref 5–15)
BUN: 24 mg/dL — ABNORMAL HIGH (ref 8–23)
CO2: 21 mmol/L — ABNORMAL LOW (ref 22–32)
Calcium: 7.8 mg/dL — ABNORMAL LOW (ref 8.9–10.3)
Chloride: 105 mmol/L (ref 98–111)
Creatinine, Ser: 1.32 mg/dL — ABNORMAL HIGH (ref 0.61–1.24)
GFR, Estimated: >60 mL/min (ref >60)
Glucose, Bld: 147 mg/dL — ABNORMAL HIGH (ref 70–99)
Potassium: 4.3 mmol/L (ref 3.5–5.1)
Sodium: 132 mmol/L — ABNORMAL LOW (ref 135–145)

## 2020-04-06 LAB — GLUCOSE, CAPILLARY
Glucose-Capillary: 116 mg/dL — ABNORMAL HIGH (ref 70–99)
Glucose-Capillary: 153 mg/dL — ABNORMAL HIGH (ref 70–99)
Glucose-Capillary: 209 mg/dL — ABNORMAL HIGH (ref 70–99)
Glucose-Capillary: 185 mg/dL — ABNORMAL HIGH (ref 70–99)

## 2020-04-06 MED ORDER — METOPROLOL TARTRATE 25 MG/10 ML ORAL SUSPENSION
12.5000 mg | Freq: Two times a day (BID) | ORAL | Status: DC
  Filled 2020-04-06 (×3): qty 5

## 2020-04-06 MED ORDER — METFORMIN HCL ER 500 MG PO TB24
500.0000 mg | ORAL_TABLET | Freq: Every day | ORAL | Status: DC
  Filled 2020-04-06: qty 1

## 2020-04-06 MED ORDER — INSULIN DETEMIR 100 UNIT/ML ~~LOC~~ SOLN
10.0000 [IU] | Freq: Two times a day (BID) | SUBCUTANEOUS | Status: DC
  Administered 2020-04-06 – 2020-04-07 (×3): 10 [IU] via SUBCUTANEOUS
  Filled 2020-04-06 (×6): qty 0.1

## 2020-04-06 MED ORDER — METOPROLOL TARTRATE 12.5 MG HALF TABLET
12.5000 mg | ORAL_TABLET | Freq: Two times a day (BID) | ORAL | Status: DC
  Administered 2020-04-06 – 2020-04-07 (×2): 12.5 mg via ORAL
  Filled 2020-04-06 (×2): qty 1

## 2020-04-06 NOTE — Progress Notes (Signed)
EPW removied w/o difficulty.  Pt tolerated well. Vital signs Q15 mins.  Bed rest x 1 hour.

## 2020-04-06 NOTE — Progress Notes (Signed)
04/06/2020 0440 Pt walked 470 ft without any assistance.  Had to take 1 small break, otherwise tolerated well.   Kathryne Hitch

## 2020-04-06 NOTE — Progress Notes (Addendum)
[address]       [place] [address]             [phone]        3 Days Post-Op Procedure(s) (LRB): CORONARY ARTERY BYPASS GRAFTING (CABG) TIMES FOUR USING LEFT INTERNAL MAMMARY ARTERY AND LEFT RADIAL ARTERY. (N/A) LEFT RADIAL ARTERY HARVEST (Left) TRANSESOPHAGEAL ECHOCARDIOGRAM (TEE) (N/A) INDOCYANINE GREEN FLUORESCENCE IMAGING (ICG) (N/A) INSERTION OF FEMORAL ARTERIAL LINE (Left)  Subjective: Patient passing flatus but no bowel movement yet.  Objective: Vital signs in last 24 hours: Temp:  [98.2 F (36.8 C)-99.4 F (37.4 C)] 99.4 F (37.4 C) (02/12 0430) Pulse Rate:  [74-109] 79 (02/12 0430) Cardiac Rhythm: Normal sinus rhythm (02/11 1941) Resp:  [17-32] 19 (02/12 0430) BP: (81-115)/(54-88) 81/54 (02/12 0430) SpO2:  [97 %-100 %] 98 % (02/12 0430) Weight:  [82.1 kg] 82.1 kg (02/12 0430)  Pre op weight 78.6 kg Current Weight  04/06/20 82.1 kg      Intake/Output from previous day: 02/11 0701 - 02/12 0700 In: 603 [P.O.:600; I.V.:3] Out: 1470 [Urine:1450; Chest Tube:20]   Physical Exam:  Cardiovascular: RRR Pulmonary: Slightly diminished bibasilar breath sounds Abdomen: Soft, non tender, bowel sounds present. Extremities: Trace bilateral lower extremity edema. Motor/sensory intact LUE Wounds: Aquacel dressing removed and sternal wound is clean and dry.  No erythema or signs of infection. LUE dressing removed and wound is clean and dry  Lab Results: CBC: Recent Labs    04/05/20 0214 04/06/20 0236  WBC 10.7* 11.1*  HGB 8.1* 8.3*  HCT 23.4* 25.1*  PLT 124* 147*   BMET:  Recent Labs    04/05/20 0214 04/06/20 0236  NA 132* 132*  K 4.7 4.3  CL 104 105  CO2 22 21*  GLUCOSE 135* 147*  BUN 24* 24*  CREATININE 1.39* 1.32*  CALCIUM 7.8* 7.8*    PT/INR:  Lab Results  Component Value Date   INR 1.4 (H) 04/03/2020   ABG:  INR: Will add last result for INR, ABG once components are confirmed Will add last 4 CBG results once  components are confirmed  Assessment/Plan: 1. CV - SR, first degree heart block with HR in the 70-80's. BP low of late, but asymptomatic. On baby ec asa, Plavix, Isordil 10 mg tid, and Lopressor 12.5 mg bid (will hold this am) 2.  Pulmonary - On room air. CXR this am appears stable (small b/l pleural effusions). Encourage incentive spirometer. 3. Volume Overload - Labile BP so will not give Lasix this am. 4.  Expected post op acute blood loss anemia - H and H this am stable at 8.3 and 25.1. Continue Trinsicon. 5. DM-CBGs 224/220/116. Will put on scheduled Insulin for now. Will restart Metformin at discharge. Pre op HGA1C 6.8. 6. Creatinine this am slightly decreased to 1.32. Creatinine upon admission 1.3. Chronic Kidney Disease   Stage I     GFR >90  Stage II    GFR 60-89  Stage IIIA GFR 45-59  Stage IIIB GFR 30-44  Stage IV   GFR 15-29  Stage V    GFR  <15  Lab Results  Component Value Date   CREATININE 1.32 (H) 04/06/2020   Estimated Creatinine Clearance: 60.2 mL/min (A) (by C-G formula based on SCr of 1.32 mg/dL (H)).  7. LOC in am if now bowel movement 8. Remove EPW 9. Possible discharge in 1-2 days  Donielle M ZimmermanPA-C 04/06/2020,7:13 AM   I have seen and examined the patient and  agree with the assessment and plan as outlined.  Purcell Nails, MD 04/06/2020 11:34 AM

## 2020-04-06 NOTE — Progress Notes (Addendum)
Mobility Specialist: Progress Note   04/06/20 1416  Mobility  Activity Ambulated in hall  Level of Assistance Contact guard assist, steadying assist  Assistive Device None  Distance Ambulated (ft) 470 ft  Mobility Response Tolerated fair  Mobility performed by Mobility specialist  Bed Position Chair  $Mobility charge 1 Mobility   Pre-Mobility: 88 HR, 100% SpO2 Post-Mobility: 101 HR, 131/92 BP, 100% SpO2  Pt presented with lateral unsteadiness during ambulation, RN notified. When asked, pt was aware of his unsteadiness. Pt was able to self correct with cues. Pt to chair after walk with call bell and phone in reach.  [facility] Keniesha Adderly Mobility Specialist Mobility Specialist Phone: [phone]

## 2020-04-06 NOTE — Progress Notes (Signed)
CARDIAC REHAB PHASE I   PRE:  Rate/Rhythm: 81SR  BP:  Supine:   Sitting: 91/68  Standing:    SaO2: 100% RA  MODE:  Ambulation: 470 ft   POST:  Rate/Rhythm: 93 SR  BP:  Supine:   Sitting: 97/73  Standing:    SaO2: 100% RA  [id number] Patient tolerated ambulation well with assist x1, gait steady, no symptoms. Discussed phase 2 cardiac rehab, and patient is interested in the program at [facility], referral sent.  Artist Pais, MS, ACSM CEP

## 2020-04-06 NOTE — Progress Notes (Signed)
Progress Note  Patient Name: Curtis Watkins Date of Encounter: 04/06/2020  Primary Cardiologist:   Tobias Alexander, MD   Subjective   Feels good.  No complaints.  Breathing OK.  Mild incisional soreness.   Inpatient Medications    Scheduled Meds: . acetaminophen  1,000 mg Oral Q6H   Or  . acetaminophen (TYLENOL) oral liquid 160 mg/5 mL  1,000 mg Per Tube Q6H  . acetaminophen (TYLENOL) oral liquid 160 mg/5 mL  650 mg Per Tube Once   Or  . acetaminophen  650 mg Rectal Once  . aspirin EC  325 mg Oral Daily   Or  . aspirin  324 mg Per Tube Daily  . atorvastatin  80 mg Oral Daily  . bisacodyl  10 mg Oral Daily   Or  . bisacodyl  10 mg Rectal Daily  . Chlorhexidine Gluconate Cloth  6 each Topical Daily  . clopidogrel  75 mg Oral Daily  . colchicine  0.3 mg Oral BID  . docusate sodium  200 mg Oral Daily  . ferrous fumarate-b12-vitamic C-folic acid  1 capsule Oral BID PC  . hydrocerin   Topical Daily  . insulin aspart  0-9 Units Subcutaneous TID WC  . insulin detemir  10 Units Subcutaneous BID  . isosorbide dinitrate  10 mg Oral TID  . mouth rinse  15 mL Mouth Rinse BID  . metoprolol tartrate  12.5 mg Oral BID   Or  . metoprolol tartrate  12.5 mg Per Tube BID  . pantoprazole  40 mg Oral Daily  . sodium chloride flush  3 mL Intravenous Q12H   Continuous Infusions: . sodium chloride Stopped (04/04/20 0916)  . sodium chloride    . sodium chloride 20 mL/hr at 04/03/20 1403  . lactated ringers    . lactated ringers Stopped (04/04/20 1830)   PRN Meds: sodium chloride, metoprolol tartrate, morphine injection, ondansetron (ZOFRAN) IV, oxyCODONE, sodium chloride flush, traMADol   Vital Signs    Vitals:   04/06/20 1045 04/06/20 1100 04/06/20 1115 04/06/20 1130  BP: 125/67 120/82 120/78 127/82  Pulse: 81 83 83 83  Resp: (!) 21 17 18 19   Temp:      TempSrc:      SpO2: 100% 99% 99% 99%  Weight:      Height:        Intake/Output Summary (Last 24 hours) at 04/06/2020  1150 Last data filed at 04/06/2020 1049 Gross per 24 hour  Intake 363 ml  Output 1970 ml  Net -1607 ml   Filed Weights   04/04/20 0500 04/05/20 0500 04/06/20 0430  Weight: 81.6 kg 82.9 kg 82.1 kg    Telemetry    NSR, brief run of SVT - Personally Reviewed  ECG    NA - Personally Reviewed  Physical Exam   GEN: No acute distress.   Neck: No  JVD Cardiac: RRR, no murmurs, rubs, or gallops.  Respiratory: Clear  to auscultation bilaterally. GI: Soft, nontender, non-distended  MS: No  edema; No deformity. Neuro:  Nonfocal  Psych: Normal affect   Labs    Chemistry Recent Labs  Lab 04/04/20 1544 04/05/20 0214 04/06/20 0236  NA 134* 132* 132*  K 5.1 4.7 4.3  CL 106 104 105  CO2 20* 22 21*  GLUCOSE 151* 135* 147*  BUN 23 24* 24*  CREATININE 1.52* 1.39* 1.32*  CALCIUM 8.1* 7.8* 7.8*  GFRNONAA 51* 57* >60  ANIONGAP 8 6 6      Hematology Recent Labs  Lab 04/04/20 1544 04/05/20 0214 04/06/20 0236  WBC 12.6* 10.7* 11.1*  RBC 2.68* 2.53* 2.75*  HGB 8.3* 8.1* 8.3*  HCT 24.8* 23.4* 25.1*  MCV 92.5 92.5 91.3  MCH 31.0 32.0 30.2  MCHC 33.5 34.6 33.1  RDW 16.8* 16.5* 15.9*  PLT 137* 124* 147*    Cardiac EnzymesNo results for input(s): TROPONINI in the last 168 hours. No results for input(s): TROPIPOC in the last 168 hours.   BNPNo results for input(s): BNP, PROBNP in the last 168 hours.   DDimer No results for input(s): DDIMER in the last 168 hours.   Radiology    DG Chest 2 View  Result Date: 04/06/2020 CLINICAL DATA:  Recent CABG. EXAM: CHEST - 2 VIEW COMPARISON:  Chest x-ray from yesterday. FINDINGS: Interval removal of the bilateral chest tubes, mediastinal drain, and right internal jugular sheath. Unchanged mild cardiomegaly status post CABG. Normal pulmonary vascularity. Unchanged small bilateral pleural effusions and mild bibasilar atelectasis. Trace left apical pneumothorax. No acute osseous abnormality. IMPRESSION: 1. Trace left apical pneumothorax  status post removal of the bilateral chest tubes. 2. Unchanged small bilateral pleural effusions and bibasilar atelectasis. Electronically Signed   By: Obie Dredge M.D.   On: 04/06/2020 07:50   DG Chest Port 1 View  Result Date: 04/05/2020 CLINICAL DATA:  Open heart surgery.  Chest tube EXAM: PORTABLE CHEST 1 VIEW COMPARISON:  04/04/2020 FINDINGS: Swan-Ganz catheter removed.  Right jugular sheath remains in place. Bilateral chest tubes remain in place. No pneumothorax. Bibasilar atelectasis unchanged. No significant effusion. IMPRESSION: Bibasilar atelectasis unchanged.  No pneumothorax. Electronically Signed   By: Marlan Palau M.D.   On: 04/05/2020 07:55    Cardiac Studies   Diagnostic Dominance: Right      Patient Profile     65 y.o. male with a hx of HTN and DMwho is beingfollowed by cardiologyfor the evaluation ofCHFandelevated troponin.  Assessment & Plan    NSTEMI:  CABG:  Doing well.  See below.    ISCHEMIC CM:   EF is 40 - 45 .  He seems to be euvolemic.  Avoid ARB/ARNI for now but will consider titration as an out patient when I am sure his creat is stable.  For now I will consolidate his beta blocker and nitrates.    HTN:  This is being managed in the context of treating his CHF   CKD III:  Improved  DM:   A1C was 6.8.  I will resume half of his previous dose of metformin and will talk with him about Farxiga at his next appt with me.  Would benefit.    CARDIOLOGY RECOMMENDATIONS:  Discharge is anticipated in the next 48 hours. Recommendations for medications and follow up:  Discharge Medications: Continue medications as they are currently listed in the [facility]. Exceptions to the above:  See above.   Follow Up: The patient's Primary Cardiologist is Tobias Alexander, MD   Follow up in the office in our office 2/25.  Please note that this is in the [place] office.  We will need to get follow with a new cardiologist since Dr. Delton See is leaving.  I will be  happy to follow him.   Signed,  Rollene Rotunda, MD  11:52 AM 04/06/2020  [facility]   For questions or updates, please contact [facility] Please consult [email or web address] for contact info under Cardiology/STEMI.   Signed, Rollene Rotunda, MD  04/06/2020, 11:50 AM

## 2020-04-07 LAB — BASIC METABOLIC PANEL
Anion gap: 7 (ref 5–15)
BUN: 19 mg/dL (ref 8–23)
CO2: 22 mmol/L (ref 22–32)
Calcium: 8.1 mg/dL — ABNORMAL LOW (ref 8.9–10.3)
Chloride: 105 mmol/L (ref 98–111)
Creatinine, Ser: 1.22 mg/dL (ref 0.61–1.24)
GFR, Estimated: >60 mL/min (ref >60)
Glucose, Bld: 120 mg/dL — ABNORMAL HIGH (ref 70–99)
Potassium: 3.9 mmol/L (ref 3.5–5.1)
Sodium: 134 mmol/L — ABNORMAL LOW (ref 135–145)

## 2020-04-07 LAB — GLUCOSE, CAPILLARY
Glucose-Capillary: 63 mg/dL — ABNORMAL LOW (ref 70–99)
Glucose-Capillary: 148 mg/dL — ABNORMAL HIGH (ref 70–99)
Glucose-Capillary: 146 mg/dL — ABNORMAL HIGH (ref 70–99)

## 2020-04-07 MED ORDER — CLOPIDOGREL BISULFATE 75 MG PO TABS: 75.0000 mg | ORAL_TABLET | Freq: Every day | ORAL | 1 refills | Status: DC

## 2020-04-07 MED ORDER — OXYCODONE HCL 5 MG PO TABS: 5.0000 mg | ORAL_TABLET | Freq: Four times a day (QID) | ORAL | 0 refills | Status: DC | PRN

## 2020-04-07 MED ORDER — FUROSEMIDE 20 MG PO TABS: 20.0000 mg | ORAL_TABLET | Freq: Every day | ORAL | 0 refills | Status: DC

## 2020-04-07 MED ORDER — ATORVASTATIN CALCIUM 80 MG PO TABS: 80.0000 mg | ORAL_TABLET | Freq: Every day | ORAL | 1 refills | Status: DC

## 2020-04-07 MED ORDER — POTASSIUM CHLORIDE ER 10 MEQ PO TBCR: 10.0000 meq | EXTENDED_RELEASE_TABLET | Freq: Every day | ORAL | 0 refills | Status: DC

## 2020-04-07 MED ORDER — ISOSORBIDE DINITRATE 10 MG PO TABS: 10.0000 mg | ORAL_TABLET | Freq: Three times a day (TID) | ORAL | 0 refills | Status: DC

## 2020-04-07 MED ORDER — LACTULOSE 10 GM/15ML PO SOLN
20.0000 g | Freq: Once | ORAL | Status: AC
  Administered 2020-04-07: 20 g via ORAL
  Filled 2020-04-07: qty 30

## 2020-04-07 MED ORDER — FERROUS SULFATE 325 (65 FE) MG PO TABS: 325.0000 mg | ORAL_TABLET | Freq: Every day | ORAL | 0 refills | Status: DC

## 2020-04-07 MED ORDER — COLCHICINE 0.6 MG PO TABS: 0.3000 mg | ORAL_TABLET | Freq: Two times a day (BID) | ORAL | 0 refills | Status: DC

## 2020-04-07 MED ORDER — HYDROCERIN EX CREA: 113.0000 "application " | TOPICAL_CREAM | Freq: Every day | CUTANEOUS | 0 refills | Status: DC

## 2020-04-07 MED ORDER — METOPROLOL TARTRATE 25 MG PO TABS: 12.5000 mg | ORAL_TABLET | Freq: Two times a day (BID) | ORAL | 1 refills | Status: DC

## 2020-04-07 MED ORDER — ASPIRIN EC 81 MG PO TBEC: 81.0000 mg | DELAYED_RELEASE_TABLET | Freq: Every day | ORAL | Status: DC

## 2020-04-07 NOTE — Progress Notes (Addendum)
[address]       [place] [address]             [phone]        4 Days Post-Op Procedure(s) (LRB): CORONARY ARTERY BYPASS GRAFTING (CABG) TIMES FOUR USING LEFT INTERNAL MAMMARY ARTERY AND LEFT RADIAL ARTERY. (N/A) LEFT RADIAL ARTERY HARVEST (Left) TRANSESOPHAGEAL ECHOCARDIOGRAM (TEE) (N/A) INDOCYANINE GREEN FLUORESCENCE IMAGING (ICG) (N/A) INSERTION OF FEMORAL ARTERIAL LINE (Left)  Subjective: Patient has not had a bowel movement yet.  Objective: Vital signs in last 24 hours: Temp:  [98.2 F (36.8 C)-100.2 F (37.9 C)] 98.7 F (37.1 C) (02/13 0521) Pulse Rate:  [80-93] 84 (02/13 0521) Cardiac Rhythm: Normal sinus rhythm (02/12 1900) Resp:  [17-23] 19 (02/13 0521) BP: (94-127)/(61-90) 99/68 (02/13 0521) SpO2:  [94 %-100 %] 99 % (02/13 0521)  Pre op weight 78.6 kg Current Weight  04/06/20 82.1 kg      Intake/Output from previous day: 02/12 0701 - 02/13 0700 In: 3 [I.V.:3] Out: 1300 [Urine:1300]   Physical Exam:  Cardiovascular: RRR Pulmonary: Slightly diminished bibasilar breath sounds Abdomen: Soft, non tender, bowel sounds present. Extremities: Trace bilateral lower extremity edema. Motor/sensory intact LUE Wounds:Sternal wound is clean and dry.  No erythema or signs of infection. LUE dressing removed and wound is clean and dry  Lab Results: CBC: Recent Labs    04/05/20 0214 04/06/20 0236  WBC 10.7* 11.1*  HGB 8.1* 8.3*  HCT 23.4* 25.1*  PLT 124* 147*   BMET:  Recent Labs    04/06/20 0236 04/07/20 0219  NA 132* 134*  K 4.3 3.9  CL 105 105  CO2 21* 22  GLUCOSE 147* 120*  BUN 24* 19  CREATININE 1.32* 1.22  CALCIUM 7.8* 8.1*    PT/INR:  Lab Results  Component Value Date   INR 1.4 (H) 04/03/2020   ABG:  INR: Will add last result for INR, ABG once components are confirmed Will add last 4 CBG results once components are confirmed  Assessment/Plan: 1. CV - SR, first degree heart block with HR in the 90's. BP  low of late, but asymptomatic. On baby ec asa, Plavix, Isordil 10 mg tid, and Lopressor 12.5 mg bid (will hold this am) 2.  Pulmonary - On room air. CXR this am appears stable (small b/l pleural effusions). Encourage incentive spirometer. 3. Volume Overload - Will give low dose Lasix for the next 5 days as still above pre op weight 4.  Expected post op acute blood loss anemia - H and H this am stable at 8.3 and 25.1. Continue Trinsicon. 5. DM-CBGs 185/63/148. Will put on scheduled Insulin for now. Will restart Metformin at discharge. Pre op HGA1C 6.8. 6. Creatinine this am decreased to 1.22. Creatinine upon admission 1.3. Chronic Kidney Disease   Stage I     GFR >90  Stage II    GFR 60-89  Stage IIIA GFR 45-59  Stage IIIB GFR 30-44  Stage IV   GFR 15-29  Stage V    GFR  <15  Lab Results  Component Value Date   CREATININE 1.22 04/07/2020   Estimated Creatinine Clearance: 65.2 mL/min (by C-G formula based on SCr of 1.22 mg/dL).  7. LOC constipation 8. Hope to discharge later today  Lelon Huh [facility] 04/07/2020,7:28 AM    I have seen and examined the patient and agree with the assessment and plan as outlined.  Plan d/c home this afternoon.  Purcell Nails, MD 04/07/2020  11:06 AM

## 2020-04-07 NOTE — Progress Notes (Signed)
Mobility Specialist: Progress Note   04/07/20 1326  Mobility  Activity Ambulated in hall  Level of Assistance Independent  Assistive Device Front wheel walker  Distance Ambulated (ft) 470 ft  Mobility Response Tolerated well  Mobility performed by Mobility specialist  Bed Position Chair  $Mobility charge 1 Mobility   Pre-Mobility: 88 HR Post-Mobility: 96 HR  Pt did much better today utilizing RW during ambulation. Pt did not experience lateral unsteadiness during walk. Pt back to chair and is hopeful for discharge soon.   [facility] Zephyra Bernardi Mobility Specialist Mobility Specialist Phone: [phone]

## 2020-04-08 MED FILL — Sodium Chloride IV Soln 0.9%: INTRAVENOUS | Qty: 2000 | Status: AC

## 2020-04-08 MED FILL — Sodium Bicarbonate IV Soln 8.4%: INTRAVENOUS | Qty: 50 | Status: AC

## 2020-04-08 MED FILL — Heparin Sodium (Porcine) Inj 1000 Unit/ML: INTRAMUSCULAR | Qty: 10 | Status: AC

## 2020-04-08 MED FILL — Lidocaine HCl Local Soln Prefilled Syringe 100 MG/5ML (2%): INTRAMUSCULAR | Qty: 5 | Status: AC

## 2020-04-08 MED FILL — Electrolyte-R (PH 7.4) Solution: INTRAVENOUS | Qty: 4000 | Status: AC

## 2020-04-09 ENCOUNTER — Telehealth (HOSPITAL_COMMUNITY): Payer: Self-pay | Admitting: *Deleted

## 2020-04-11 ENCOUNTER — Telehealth (HOSPITAL_COMMUNITY): Payer: Self-pay

## 2020-04-11 NOTE — Telephone Encounter (Signed)
Called and spoke with pt in regards to CR, pt stated he is not interested at this time.   Closed referral 

## 2020-04-13 ENCOUNTER — Other Ambulatory Visit: Payer: Self-pay | Admitting: Cardiothoracic Surgery

## 2020-04-13 ENCOUNTER — Telehealth: Payer: Self-pay | Admitting: Cardiothoracic Surgery

## 2020-04-13 MED ORDER — POTASSIUM CHLORIDE ER 10 MEQ PO TBCR: 10.0000 meq | EXTENDED_RELEASE_TABLET | Freq: Every day | ORAL | 0 refills | Status: DC

## 2020-04-13 MED ORDER — FUROSEMIDE 20 MG PO TABS: 20.0000 mg | ORAL_TABLET | Freq: Every day | ORAL | 0 refills | Status: DC

## 2020-04-13 NOTE — Telephone Encounter (Signed)
  Patient is status post coronary artery bypass grafting discharged home 6 days ago Called today because he still had some swelling in his ankles bilateral We continued his potassium and Lasix 20 mg a day for an additional 5 days Is instructed to keep his feet elevated when not walking He will call the office back if there is no improvement

## 2020-04-15 ENCOUNTER — Ambulatory Visit (INDEPENDENT_AMBULATORY_CARE_PROVIDER_SITE_OTHER): Payer: Self-pay | Admitting: Orthopedic Surgery

## 2020-04-15 ENCOUNTER — Encounter: Payer: Self-pay | Admitting: Orthopedic Surgery

## 2020-04-15 DIAGNOSIS — L97511 Non-pressure chronic ulcer of other part of right foot limited to breakdown of skin: Secondary | ICD-10-CM

## 2020-04-15 DIAGNOSIS — M86671 Other chronic osteomyelitis, right ankle and foot: Secondary | ICD-10-CM

## 2020-04-15 NOTE — Progress Notes (Signed)
Office Visit Note   Patient: Curtis Watkins           Date of Birth: January 20, 1956           MRN: [id number] Visit Date: 04/15/2020              Requested by: Doran Stabler, NP [id number] N. [address] [place],  [place] [address] PCP: Doran Stabler, NP  Chief Complaint  Patient presents with  . Right Foot - Follow-up    ER 03/27/20        HPI: Patient is a 65 year old gentleman who is seen for initial evaluation for chronic osteomyelitis right foot second and third toes.  Patient was initially seen in the emergency room and presents at this time for initial evaluation.  Assessment & Plan: Visit Diagnoses:  1. Chronic osteomyelitis of toe, right (HCC)     Plan: We will plan for amputation of the right foot second and third toes through the MTP joint.  Discussed the importance of nonweightbearing postoperatively.  Risk and benefits were discussed including risk of the wound not healing need for additional surgery.  Patient states he understands wished to proceed with surgery soon as possible.  Follow-Up Instructions: Return in about 1 week (around 04/22/2020).   Ortho Exam  Patient is alert, oriented, no adenopathy, well-dressed, normal affect, normal respiratory effort. Examination patient has a good dorsalis pedis pulse bilaterally.  He has massive ulcerations across the forefoot bilaterally.  After informed consent a 10 blade knife was used to abrade the skin and soft tissue back to healthy viable tissue this was touched with silver nitrate after debridement the ulcers were 5 cm in diameter 5 mm deep across the forefoot of both feet.  Predebridement the ulcers were 10 mm in diameter with proud callus.  He has thickened discolored onychomycotic nails and the nails were trimmed x10 without complications he has sausage digit swelling and dark discoloration of the second and third toes of the right foot.  Review of the radiographs shows destruction of the tuft of the second and third  toes.  There is ulceration that goes down to the toe on both feet.  Imaging: No results found. No images are attached to the encounter.  Labs: Lab Results  Component Value Date   HGBA1C 6.8 (H) 03/28/2020     No results found for: ALBUMIN, PREALBUMIN, LABURIC  Lab Results  Component Value Date   MG 2.6 (H) 04/04/2020   MG 2.6 (H) 04/04/2020   MG 2.9 (H) 04/03/2020   No results found for: VD25OH  No results found for: PREALBUMIN CBC EXTENDED Latest Ref Rng & Units 04/06/2020 04/05/2020 04/04/2020  WBC 4.0 - 10.5 K/uL 11.1(H) 10.7(H) 12.6(H)  RBC 4.22 - 5.81 MIL/uL 2.75(L) 2.53(L) 2.68(L)  HGB 13.0 - 17.0 g/dL 8.3(L) 8.1(L) 8.3(L)  HCT 39.0 - 52.0 % 25.1(L) 23.4(L) 24.8(L)  PLT 150 - 400 K/uL 147(L) 124(L) 137(L)     There is no height or weight on file to calculate BMI.  Orders:  No orders of the defined types were placed in this encounter.  No orders of the defined types were placed in this encounter.    Procedures: No procedures performed  Clinical Data: No additional findings.  ROS:  All other systems negative, except as noted in the HPI. Review of Systems  Objective: Vital Signs: There were no vitals taken for this visit.  Specialty Comments:  No specialty comments available.  PMFS History: Patient Active Problem List  Diagnosis Date Noted  . 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 Coronary artery disease involving native coronary artery of native heart with unstable angina pectoris (HCC)   . Acute congestive heart failure (HCC) 03/27/2020  . Diabetic foot ulcers (HCC) 03/27/2020  . DM2 (diabetes mellitus, type 2) (HCC) 03/27/2020  . HTN (hypertension) 03/27/2020   Past Medical History:  Diagnosis Date  . Diabetes mellitus without complication (HCC)   . Hypertension     History reviewed. No pertinent family history.  Past Surgical History:  Procedure Laterality Date  . CENTRAL VENOUS CATHETER INSERTION Left 04/03/2020   Procedure: INSERTION OF FEMORAL  ARTERIAL LINE;  Surgeon: Linden Dolin, MD;  Location: [facility] OR;  Service: Open Heart Surgery;  Laterality: Left;  . CORONARY ARTERY BYPASS GRAFT N/A 04/03/2020   Procedure: CORONARY ARTERY BYPASS GRAFTING (CABG) TIMES FOUR USING LEFT INTERNAL MAMMARY ARTERY AND LEFT RADIAL ARTERY.;  Surgeon: Linden Dolin, MD;  Location: [facility] OR;  Service: Open Heart Surgery;  Laterality: N/A;  . CORONARY PRESSURE WIRE/FFR WITH 3D MAPPING N/A 03/29/2020   Procedure: Coronary Pressure Wire/FFR w/3D Mapping;  Surgeon: Lyn Records, MD;  Location: [facility] INVASIVE CV LAB;  Service: Cardiovascular;  Laterality: N/A;  . LEFT HEART CATH AND CORONARY ANGIOGRAPHY N/A 03/29/2020   Procedure: LEFT HEART CATH AND CORONARY ANGIOGRAPHY;  Surgeon: Lyn Records, MD;  Location: [facility] INVASIVE CV LAB;  Service: Cardiovascular;  Laterality: N/A;  . RADIAL ARTERY HARVEST Left 04/03/2020   Procedure: LEFT RADIAL ARTERY HARVEST;  Surgeon: Linden Dolin, MD;  Location: [facility] OR;  Service: Open Heart Surgery;  Laterality: Left;  . TEE WITHOUT CARDIOVERSION N/A 04/03/2020   Procedure: TRANSESOPHAGEAL ECHOCARDIOGRAM (TEE);  Surgeon: Linden Dolin, MD;  Location: [facility] OR;  Service: Open Heart Surgery;  Laterality: N/A;  . UPPER EXTREMITY ANGIOGRAPHY  03/29/2020   Procedure: Upper Extremity Angiography;  Surgeon: Lyn Records, MD;  Location: [facility] INVASIVE CV LAB;  Service: Cardiovascular;;  rt.  arm   Social History   Occupational History  . Not on file  Tobacco Use  . Smoking status: Current Every Day Smoker    Packs/day: 1.00    Types: Cigarettes  . Smokeless tobacco: Never Used  Substance and Sexual Activity  . Alcohol use: Yes  . Drug use: Yes    Frequency: 3.0 times per week    Types: Marijuana  . Sexual activity: Not Currently

## 2020-04-18 NOTE — Progress Notes (Deleted)
Cardiology Clinic Note   Patient Name: Curtis Watkins Date of Encounter: 04/18/2020  Primary Care Provider:  Doran Stabler, NP Primary Cardiologist:  Tobias Alexander, MD  Patient Profile    Curtis Watkins 65 year old male presents the clinic today for follow-up evaluation status post CABG x4 04/03/2020.  Past Medical History    Past Medical History:  Diagnosis Date  . Diabetes mellitus without complication (HCC)   . Hypertension    Past Surgical History:  Procedure Laterality Date  . CENTRAL VENOUS CATHETER INSERTION Left 04/03/2020   Procedure: INSERTION OF FEMORAL ARTERIAL LINE;  Surgeon: Linden Dolin, MD;  Location: [facility] OR;  Service: Open Heart Surgery;  Laterality: Left;  . CORONARY ARTERY BYPASS GRAFT N/A 04/03/2020   Procedure: CORONARY ARTERY BYPASS GRAFTING (CABG) TIMES FOUR USING LEFT INTERNAL MAMMARY ARTERY AND LEFT RADIAL ARTERY.;  Surgeon: Linden Dolin, MD;  Location: [facility] OR;  Service: Open Heart Surgery;  Laterality: N/A;  . CORONARY PRESSURE WIRE/FFR WITH 3D MAPPING N/A 03/29/2020   Procedure: Coronary Pressure Wire/FFR w/3D Mapping;  Surgeon: Lyn Records, MD;  Location: [facility] INVASIVE CV LAB;  Service: Cardiovascular;  Laterality: N/A;  . LEFT HEART CATH AND CORONARY ANGIOGRAPHY N/A 03/29/2020   Procedure: LEFT HEART CATH AND CORONARY ANGIOGRAPHY;  Surgeon: Lyn Records, MD;  [facility]: [facility] INVAS[facility] CV LAB;  Service: Cardiovascular;  Laterality: N/A;  . RADIAL ARTERY HARVEST Left 04/03/2020   Procedure: LEFT RADIAL ARTERY HARVEST;  Surgeon: Linden Dolin, MD;  Location: [facility] OR;  Service: Open Heart Surgery;  Laterality: Left;  . TEE WITHOUT CARDIOVERSION N/A 04/03/2020   Procedure: TRANSESOPHAGEAL ECHOCARDIOGRAM (TEE);  Surgeon: Linden Dolin, MD;  Location: [facility] OR;  Service: Open Heart Surgery;  Laterality: N/A;  . UPPER EXTREMITY ANGIOGRAPHY  03/29/2020   Procedure: Upper Extremity Angiography;  Surgeon: Lyn Records, MD;  Location: [facility] INVASIVE CV LAB;   Service: Cardiovascular;;  rt.  arm    Allergies  No Known Allergies  History of Present Illness    Curtis Watkins has a PMH of type 2 diabetes, hypertension, tobacco use, and coronary artery disease.  He presented to urgent care at the end of January 2022.  His primary complaint was shortness of breath had been present for 2 days.  He was also noted to have lower extremity swelling.  He was referred to [name], ED was seen on 03/27/2020.  His initial EKG showed no acute ischemia, chest x-ray showed small bilateral pleural effusions, and his high-sensitivity troponins measured 300 and were similar with repeat testing.  He was admitted to the hospital with a diagnosis of new onset congestive heart failure.  He was given IV diuresis and his symptoms improved.  He underwent left heart cath on 03/27/2020.  A right radial approach was used however, it was noted that there was an occlusion right axillary artery.  A femoral approach was then used and two-vessel coronary artery disease was noted and has LAD and circumflex coronary artery.  His EF was 35-40%.  CABG was recommended.  He was taken to the operating room on 04/03/2020 and underwent CABG x4 (LIMA-diagonal, LIMA-LAD, left radial artery to OM1, and OM 3.  He progressed well postoperatively without difficulty.  He was noted to have diabetic foot ulcers.  Wound care was consulted and he is being treated by Dr. Lajoyce Corners.  He remained in sinus rhythm.  Surgical incisions are healing without difficulties and he was discharged in stable condition on 04/07/2020.  He presents the clinic today for follow-up evaluation states***  *** denies chest pain, shortness of breath, lower extremity edema, fatigue, palpitations, melena, hematuria, hemoptysis, diaphoresis, weakness, presyncope, syncope, orthopnea, and PND.    Home Medications    Prior to Admission medications   Medication Sig Start Date End Date Taking? Authorizing Provider  acetaminophen (TYLENOL) 500 MG tablet  Take 2 tablets (1,000 mg total) by mouth every 6 (six) hours as needed. 04/05/20   Barrett, Rae Roam, PA-C  aspirin 81 MG tablet Take 1 tablet (81 mg total) by mouth daily. 04/07/20   Ardelle Balls, PA-C  atorvastatin (LIPITOR) 80 MG tablet Take 1 tablet (80 mg total) by mouth daily. 04/07/20   Ardelle Balls, PA-C  clopidogrel (PLAVIX) 75 MG tablet Take 1 tablet (75 mg total) by mouth daily. 04/07/20   Ardelle Balls, PA-C  colchicine 0.6 MG tablet Take 0.5 tablets (0.3 mg total) by mouth 2 (two) times daily. 04/07/20   Ardelle Balls, PA-C  ferrous sulfate 325 (65 FE) MG tablet Take 1 tablet (325 mg total) by mouth daily. For one month then stop. If develops constipation, may take stool softener or laxative. May stop sooner if develops nausea or further constipation 04/07/20 04/07/21  Ardelle Balls, PA-C  furosemide (LASIX) 20 MG tablet Take 1 tablet (20 mg total) by mouth daily. For 5 days then stop. 04/13/20   Delight Ovens, MD  hydrocerin (EUCERIN) CREA Apply 113 application topically daily. 04/07/20   Ardelle Balls, PA-C  isosorbide dinitrate (ISORDIL) 10 MG tablet Take 1 tablet (10 mg total) by mouth 3 (three) times daily. 04/07/20   Ardelle Balls, PA-C  metFORMIN (GLUCOPHAGE) 1000 MG tablet Take 1,000 mg by mouth daily with breakfast. 12/12/19   [provider]  metoprolol tartrate (LOPRESSOR) 25 MG tablet Take 0.5 tablets (12.5 mg total) by mouth 2 (two) times daily. 04/07/20   Ardelle Balls, PA-C  oxyCODONE (OXY IR/ROXICODONE) 5 MG immediate release tablet Take 1 tablet (5 mg total) by mouth every 6 (six) hours as needed for severe pain. 04/07/20   Ardelle Balls, PA-C  potassium chloride (KLOR-CON) 10 MEQ tablet Take 1 tablet (10 mEq total) by mouth daily. For 5 days then stop. 04/13/20   Delight Ovens, MD    Family History    No family history on file. has no family status information on file.   Social History     Social History   Socioeconomic History  . Marital status: Significant Other    Spouse name: Not on file  . Number of children: Not on file  . Years of education: Not on file  . Highest education level: Not on file  Occupational History  . Not on file  Tobacco Use  . Smoking status: Current Every Day Smoker    Packs/day: 1.00    Types: Cigarettes  . Smokeless tobacco: Never Used  Substance and Sexual Activity  . Alcohol use: Yes  . Drug use: Yes    Frequency: 3.0 times per week    Types: Marijuana  . Sexual activity: Not Currently  Other Topics Concern  . Not on file  Social History Narrative  . Not on file   Social Determinants of Health   Financial Resource Strain: Not on file  Food Insecurity: Not on file  Transportation Needs: Not on file  Physical Activity: Not on file  Stress: Not on file  Social Connections: Not on file  Intimate Partner  Violence: Not on file     Review of Systems    General:  No chills, fever, night sweats or weight changes.  Cardiovascular:  No chest pain, dyspnea on exertion, edema, orthopnea, palpitations, paroxysmal nocturnal dyspnea. Dermatological: No rash, lesions/masses Respiratory: No cough, dyspnea Urologic: No hematuria, dysuria Abdominal:   No nausea, vomiting, diarrhea, bright red blood per rectum, melena, or hematemesis Neurologic:  No visual changes, wkns, changes in mental status. All other systems reviewed and are otherwise negative except as noted above.  Physical Exam    VS:  There were no vitals taken for this visit. , BMI There is no height or weight on file to calculate BMI. GEN: Well nourished, well developed, in no acute distress. HEENT: normal. Neck: Supple, no JVD, carotid bruits, or masses. Cardiac: RRR, no murmurs, rubs, or gallops. No clubbing, cyanosis, edema.  Radials/DP/PT 2+ and equal bilaterally.  Respiratory:  Respirations regular and unlabored, clear to auscultation bilaterally. GI: Soft, nontender,  nondistended, BS + x 4. MS: no deformity or atrophy. Skin: warm and dry, no rash. Neuro:  Strength and sensation are intact. Psych: Normal affect.  Accessory Clinical Findings    Recent Labs: 03/27/2020: B Natriuretic Peptide 1,383.1 04/04/2020: Magnesium 2.6 04/06/2020: Hemoglobin 8.3; Platelets 147 04/07/2020: BUN 19; Creatinine, Ser 1.22; Potassium 3.9; Sodium 134   Recent Lipid Panel    Component Value Date/Time   CHOL 119 03/29/2020 0600   TRIG 59 03/29/2020 0600   HDL 42 03/29/2020 0600   CHOLHDL 2.8 03/29/2020 0600   VLDL 12 03/29/2020 0600   LDLCALC 65 03/29/2020 0600    ECG personally reviewed by me today- *** - No acute changes  EKG  Echocardiogram 03/28/2020 IMPRESSIONS    1. Left ventricular ejection fraction, by estimation, is 40 to 45%. The  left ventricle has mildly decreased function. There is mild left  ventricular hypertrophy. Left ventricular diastolic parameters are  consistent with Grade II diastolic dysfunction  (pseudonormalization). Elevated left atrial pressure.  2. Right ventricular systolic function is normal. The right ventricular  size is normal.  3. Left atrial size was mildly dilated.  4. Mild mitral valve regurgitation.  5. The inferior vena cava is dilated in size with <50% respiratory  variability, suggesting right atrial pressure of 15 mmHg.   Cardiac catheterization 03/29/2020   Diabetic with severe two-vessel disease including the LAD and complex disease in the circumflex.  Ischemic cardiomyopathy with LVEF 35 to 40%.  LVEDP is less than 10 mmHg.  Left main is widely patent  Right coronary is widely patent but contains diffuse atherosclerosis without focal narrowing.  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  RECOMMENDATIONS:   Continue IV heparin  Continue to treat heart failure  Watch kidney function  T CTS evaluation to consider coronary artery bypass grafting with LIMA to the LAD.  SVG to circumflex or  arterial graft assuming that the left radial has flow.. Diagnostic Dominance: Right    Intervention    Assessment & Plan   1.  Status post CABG x4-no chest pain today.  Continues to progress his physical activity slowly.  Reports compliance with sternal precautions.  CABG x4 04/03/2020  TIMES FOUR USING LEFT INTERNAL MAMMARY ARTERY AND LEFT RADIAL ARTERY Continue aspirin, atorvastatin, Plavix, Imdur Heart healthy low-sodium diet-salty 6 given Increase physical activity as tolerated maintain sternal questions  Acute systolic CHF-euvolemic today.  EF 35-40%.  No increased DOE and continues to slowly increase physical activity. Continue furosemide, potassium Heart healthy low-sodium diet-salty  6 given Increase physical activity as tolerated  Essential hypertension-BP today***.  Well-controlled at home. Continue furosemide Heart healthy low-sodium diet-salty 6 given Increase physical activity as tolerated  Hyperlipidemia-03/29/2020: Cholesterol 119; HDL 42; LDL Cholesterol 65; Triglycerides 59; VLDL 12 Continue atorvastatin Heart healthy low-sodium high-fiber diet Increase physical activity as tolerated   Type 2 diabetes-BS 120 on 04/07/2020 Continue Metformin Heart healthy low-sodium carb modified diet Increase physical activity as tolerated Follows with PCP  Disposition: Follow-up with Dr. Delton See in 3 months.   Thomasene Ripple. Tayllor Breitenstein NP-C    04/18/2020, 9:35 AM [facility] [address] Office [phone] Fax [phone]  Notice: This dictation was prepared with Dragon dictation along with smaller phrase technology. Any transcriptional errors that result from this process are unintentional and may not be corrected upon review.  I spent***minutes examining this patient, reviewing medications, and using patient centered shared decision making involving her cardiac care.  Prior to her visit I spent greater than 20 minutes reviewing her past  medical history,  medications, and prior cardiac tests.

## 2020-04-19 ENCOUNTER — Encounter: Payer: Self-pay | Admitting: General Practice

## 2020-04-19 ENCOUNTER — Other Ambulatory Visit: Payer: Self-pay

## 2020-04-19 ENCOUNTER — Other Ambulatory Visit: Payer: Self-pay | Admitting: Thoracic Surgery (Cardiothoracic Vascular Surgery)

## 2020-04-19 ENCOUNTER — Ambulatory Visit: Payer: Self-pay | Admitting: General Practice

## 2020-04-19 ENCOUNTER — Ambulatory Visit (INDEPENDENT_AMBULATORY_CARE_PROVIDER_SITE_OTHER): Payer: Self-pay | Admitting: General Practice

## 2020-04-19 ENCOUNTER — Telehealth: Payer: Self-pay | Admitting: Orthopedic Surgery

## 2020-04-19 VITALS — BP 102/62 | HR 91 | Ht 71.0 in | Wt 179.8 lb

## 2020-04-19 DIAGNOSIS — I10 Essential (primary) hypertension: Secondary | ICD-10-CM

## 2020-04-19 DIAGNOSIS — E1100 Type 2 diabetes mellitus with hyperosmolarity without nonketotic hyperglycemic-hyperosmolar coma (NKHHC): Secondary | ICD-10-CM

## 2020-04-19 DIAGNOSIS — E7800 Pure hypercholesterolemia, unspecified: Secondary | ICD-10-CM

## 2020-04-19 DIAGNOSIS — Z951 Presence of aortocoronary bypass graft: Secondary | ICD-10-CM

## 2020-04-19 DIAGNOSIS — I5021 Acute systolic (congestive) heart failure: Secondary | ICD-10-CM

## 2020-04-19 NOTE — Progress Notes (Signed)
Cardiology Clinic Note   Patient Name: Curtis Watkins Date of Encounter: 04/19/2020  Primary Care Provider:  Doran Stabler, NP Primary Cardiologist:  Tobias Alexander, MD  Patient Profile    Curtis Clos. Watkins 65 year old male presents the clinic today for follow-up evaluation status post CABG x4 04/03/2020.  Past Medical History    Past Medical History:  Diagnosis Date  . Diabetes mellitus without complication (HCC)   . Hypertension    Past Surgical History:  Procedure Laterality Date  . CENTRAL VENOUS CATHETER INSERTION Left 04/03/2020   Procedure: INSERTION OF FEMORAL ARTERIAL LINE;  Surgeon: Linden Dolin, MD;  Location: [facility] OR;  Service: Open Heart Surgery;  Laterality: Left;  . CORONARY ARTERY BYPASS GRAFT N/A 04/03/2020   Procedure: CORONARY ARTERY BYPASS GRAFTING (CABG) TIMES FOUR USING LEFT INTERNAL MAMMARY ARTERY AND LEFT RADIAL ARTERY.;  Surgeon: Linden Dolin, MD;  Location: [facility] OR;  Service: Open Heart Surgery;  Laterality: N/A;  . CORONARY PRESSURE WIRE/FFR WITH 3D MAPPING N/A 03/29/2020   Procedure: Coronary Pressure Wire/FFR w/3D Mapping;  Surgeon: Lyn Records, MD;  Location: [facility] INVASIVE CV LAB;  Service: Cardiovascular;  Laterality: N/A;  . LEFT HEART CATH AND CORONARY ANGIOGRAPHY N/A 03/29/2020   Procedure: LEFT HEART CATH AND CORONARY ANGIOGRAPHY;  Surgeon: Lyn Records, MD;  Location: [facility] INVASIVE CV LAB;  Service: Cardiovascular;  Laterality: N/A;  . RADIAL ARTERY HARVEST Left 04/03/2020   Procedure: LEFT RADIAL ARTERY HARVEST;  Surgeon: Linden Dolin, MD;  Location: [facility] OR;  Service: Open Heart Surgery;  Laterality: Left;  . TEE WITHOUT CARDIOVERSION N/A 04/03/2020   Procedure: TRANSESOPHAGEAL ECHOCARDIOGRAM (TEE);  Surgeon: Linden Dolin, MD;  Location: [facility] OR;  Service: Open Heart Surgery;  Laterality: N/A;  . UPPER EXTREMITY ANGIOGRAPHY  03/29/2020   Procedure: Upper Extremity Angiography;  Surgeon: Lyn Records, MD;  Location: [facility] INVASIVE CV LAB;   Service: Cardiovascular;;  rt.  arm    Allergies  No Known Allergies  History of Present Illness    Curtis Watkins has a PMH of type 2 diabetes, hypertension, tobacco use, and coronary artery disease.  He presented to urgent care at the end of January 2022.  His primary complaint was shortness of breath had been present for 2 days.  He was also noted to have lower extremity swelling.  [facility] was referred to [name], ED was seen on 03/27/2020.  His initial EKG showed no acute ischemia, chest x-ray showed small bilateral pleural effusions, and his high-sensitivity troponins measured 300 and were similar with repeat testing.  He was admitted to the hospital with a diagnosis of new onset congestive heart failure.  He was given IV diuresis and his symptoms improved.  He underwent left heart cath on 03/27/2020.  A right radial approach was used however, it was noted that there was an occlusion right axillary artery.  A femoral approach was then used and two-vessel coronary artery disease was noted and has LAD and circumflex coronary artery.  His EF was 35-40%.  CABG was recommended.  He was taken to the operating room on 04/03/2020 and underwent CABG x4 (LIMA-diagonal, LIMA-LAD, left radial artery to OM1, and OM 3.  He progressed well postoperatively without difficulty.  He was noted to have diabetic foot ulcers.  Wound care was consulted and he is being treated by Dr. Lajoyce Corners.  He remained in sinus rhythm.  Surgical incisions are healing without difficulties and he was discharged in stable condition on 04/07/2020.  He presents the clinic today for follow-up evaluation states he feels well.  He has been slowly progressing his physical activity.  He reports he is compliant with all of his medications and denies side effects.  We reviewed sternal precautions.  I assessed his staples and sutures.  Incisions and chest tube sites clean dry intact no drainage.  He does have some ankle edema.  His wife states that he was seen the  podiatrist for his right foot again next week.  I will give him the [place] support stocking sheet, give him a salty 6 diet sheet, and have him follow-up in 3 months.  Today he denies chest pain, shortness of breath, lower extremity edema, fatigue, palpitations, melena, hematuria, hemoptysis, diaphoresis, weakness, presyncope, syncope, orthopnea, and PND.  Home Medications    Prior to Admission medications   Medication Sig Start Date End Date Taking? Authorizing Provider  acetaminophen (TYLENOL) 500 MG tablet Take 2 tablets (1,000 mg total) by mouth every 6 (six) hours as needed. 04/05/20   Barrett, Rae RoamErin R, PA-C  aspirin 81 MG tablet Take 1 tablet (81 mg total) by mouth daily. 04/07/20   Ardelle BallsZimmerman, Donielle M, PA-C  atorvastatin (LIPITOR) 80 MG tablet Take 1 tablet (80 mg total) by mouth daily. 04/07/20   Ardelle BallsZimmerman, Donielle M, PA-C  clopidogrel (PLAVIX) 75 MG tablet Take 1 tablet (75 mg total) by mouth daily. 04/07/20   Ardelle BallsZimmerman, Donielle M, PA-C  colchicine 0.6 MG tablet Take 0.5 tablets (0.3 mg total) by mouth 2 (two) times daily. 04/07/20   Ardelle BallsZimmerman, Donielle M, PA-C  ferrous sulfate 325 (65 FE) MG tablet Take 1 tablet (325 mg total) by mouth daily. For one month then stop. If develops constipation, may take stool softener or laxative. May stop sooner if develops nausea or further constipation 04/07/20 04/07/21  Ardelle BallsZimmerman, Donielle M, PA-C  furosemide (LASIX) 20 MG tablet Take 1 tablet (20 mg total) by mouth daily. For 5 days then stop. 04/13/20   Delight OvensGerhardt, Edward B, MD  hydrocerin (EUCERIN) CREA Apply 113 application topically daily. 04/07/20   Ardelle BallsZimmerman, Donielle M, PA-C  isosorbide dinitrate (ISORDIL) 10 MG tablet Take 1 tablet (10 mg total) by mouth 3 (three) times daily. 04/07/20   Ardelle BallsZimmerman, Donielle M, PA-C  metFORMIN (GLUCOPHAGE) 1000 MG tablet Take 1,000 mg by mouth daily with breakfast. 12/12/19   [provider]  metoprolol tartrate (LOPRESSOR) 25 MG tablet Take 0.5 tablets (12.5  mg total) by mouth 2 (two) times daily. 04/07/20   Ardelle BallsZimmerman, Donielle M, PA-C  oxyCODONE (OXY IR/ROXICODONE) 5 MG immediate release tablet Take 1 tablet (5 mg total) by mouth every 6 (six) hours as needed for severe pain. 04/07/20   Ardelle BallsZimmerman, Donielle M, PA-C  potassium chloride (KLOR-CON) 10 MEQ tablet Take 1 tablet (10 mEq total) by mouth daily. For 5 days then stop. 04/13/20   Delight OvensGerhardt, Edward B, MD    Family History    History reviewed. No pertinent family history. has no family status information on file.   Social History    Social History   Socioeconomic History  . Marital status: Significant Other    Spouse name: Not on file  . Number of children: Not on file  . Years of education: Not on file  . Highest education level: Not on file  Occupational History  . Not on file  Tobacco Use  . Smoking status: Current Every Day Smoker    Packs/day: 1.00    Types: Cigarettes  . Smokeless tobacco: Never Used  Substance and Sexual Activity  . Alcohol use: Yes  . Drug use: Yes    Frequency: 3.0 times per week    Types: Marijuana  . Sexual activity: Not Currently  Other Topics Concern  . Not on file  Social History Narrative  . Not on file   Social Determinants of Health   Financial Resource Strain: Not on file  Food Insecurity: Not on file  Transportation Needs: Not on file  Physical Activity: Not on file  Stress: Not on file  Social Connections: Not on file  Intimate Partner Violence: Not on file     Review of Systems    General:  No chills, fever, night sweats or weight changes.  Cardiovascular:  No chest pain, dyspnea on exertion, edema, orthopnea, palpitations, paroxysmal nocturnal dyspnea. Dermatological: No rash, lesions/masses Respiratory: No cough, dyspnea Urologic: No hematuria, dysuria Abdominal:   No nausea, vomiting, diarrhea, bright red blood per rectum, melena, or hematemesis Neurologic:  No visual changes, wkns, changes in mental status. All other  systems reviewed and are otherwise negative except as noted above.  Physical Exam    VS:  BP 102/62   Pulse 91   Ht 5\' 11"  (1.803 m)   Wt 179 lb 12.8 oz (81.6 kg)   SpO2 90%   BMI 25.08 kg/m  , BMI Body mass index is 25.08 kg/m. GEN: Well nourished, well developed, in no acute distress. HEENT: normal. Neck: Supple, no JVD, carotid bruits, or masses. Cardiac: RRR, no murmurs, rubs, or gallops. No clubbing, cyanosis, edema.  Radials/DP/PT 2+ and equal bilaterally.  Respiratory:  Respirations regular and unlabored, clear to auscultation bilaterally. GI: Soft, nontender, nondistended, BS + x 4. MS: no deformity or atrophy. Skin: warm and dry, no rash.  Chest tube sites, sternal incision, left radial incision clean dry intact no drainage. Neuro:  Strength and sensation are intact. Psych: Normal affect.  Accessory Clinical Findings    Recent Labs: 03/27/2020: B Natriuretic Peptide 1,383.1 04/04/2020: Magnesium 2.6 04/06/2020: Hemoglobin 8.3; Platelets 147 04/07/2020: BUN 19; Creatinine, Ser 1.22; Potassium 3.9; Sodium 134   Recent Lipid Panel    Component Value Date/Time   CHOL 119 03/29/2020 0600   TRIG 59 03/29/2020 0600   HDL 42 03/29/2020 0600   CHOLHDL 2.8 03/29/2020 0600   VLDL 12 03/29/2020 0600   LDLCALC 65 03/29/2020 0600    ECG personally reviewed by me today-normal sinus rhythm T wave abnormality consider inferior ischemia anterolateral ischemia 91 bpm- No acute changes  Echocardiogram 03/28/2020 IMPRESSIONS     1. Left ventricular ejection fraction, by estimation, is 40 to 45%. The  left ventricle has mildly decreased function. There is mild left  ventricular hypertrophy. Left ventricular diastolic parameters are  consistent with Grade II diastolic dysfunction  (pseudonormalization). Elevated left atrial pressure.   2. Right ventricular systolic function is normal. The right ventricular  size is normal.   3. Left atrial size was mildly dilated.   4. Mild mitral  valve regurgitation.   5. The inferior vena cava is dilated in size with <50% respiratory  variability, suggesting right atrial pressure of 15 mmHg.   Cardiac catheterization 03/29/2020   Diabetic with severe two-vessel disease including the LAD and complex disease in the circumflex.  Ischemic cardiomyopathy with LVEF 35 to 40%.  LVEDP is less than 10 mmHg.  Left main is widely patent  Right coronary is widely patent but contains diffuse atherosclerosis without focal narrowing.  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  RECOMMENDATIONS:    Continue IV heparin  Continue to treat heart failure  Watch kidney function T CTS evaluation to consider coronary artery bypass grafting with LIMA to the LAD.  SVG to circumflex or arterial graft assuming that the left radial has flow..Diagnostic Dominance: Right      Assessment & Plan   1.  Status post CABG x4-no chest pain today.  Continues to progress his physical activity slowly.  Reports compliance with sternal precautions.  CABG x4 04/03/2020  TIMES FOUR USING LEFT INTERNAL MAMMARY ARTERY AND LEFT RADIAL ARTERY.  Slight ankle edema. Continue aspirin, atorvastatin, Plavix, Imdur Heart healthy low-sodium diet-salty 6 given Increase physical activity as tolerated maintain sternal questions [place] support stocking sheet given  Acute systolic CHF-euvolemic today.  EF 35-40%.  No increased DOE and continues to slowly increase physical activity. Continue furosemide, potassium Heart healthy low-sodium diet-salty 6 given Increase physical activity as tolerated  Essential hypertension-BP today 102/62.  Well-controlled at home. Continue furosemide Heart healthy low-sodium diet-salty 6 given Increase physical activity as tolerated  Hyperlipidemia-03/29/2020: Cholesterol 119; HDL 42; LDL Cholesterol 65; Triglycerides 59; VLDL 12 Continue atorvastatin Heart healthy low-sodium high-fiber diet Increase physical activity as  tolerated   Type 2 diabetes-BS 120 on 04/07/2020 Continue Metformin Heart healthy low-sodium carb modified diet Increase physical activity as tolerated Follows with PCP  Disposition: Follow-up with Dr. Delton See in 3 months and Dr. Cliffton Asters as scheduled.Thomasene Ripple. Juda Toepfer NP-C    04/19/2020, 2:05 PM [facility] [address] Office [phone] Fax [phone]  Notice: This dictation was prepared with Dragon dictation along with smaller phrase technology. Any transcriptional errors that result from this process are unintentional and may not be corrected upon review.  I spent 12 minutes examining this patient, reviewing medications, and using patient centered shared decision making involving her cardiac care.  Prior to her visit I spent greater than 20 minutes reviewing her past medical history,  medications, and prior cardiac tests.

## 2020-04-19 NOTE — Patient Instructions (Signed)
Medication Instructions:  The current medical regimen is effective;  continue present plan and medications as directed. Please refer to the Current Medication list given to you today.  *If you need a refill on your cardiac medications before your next appointment, please call your pharmacy*  Lab Work:   Testing/Procedures:  NONE    NONE  Special Instructions PLEASE PURCHASE AND WEAR COMPRESSION STOCKINGS DAILY AND TAKE OFF AT BEDTIME. Compression stockings are elastic socks that squeeze the legs. They help to increase blood flow to the legs and to decrease swelling in the legs from fluid retention, and reduce the chance of developing blood clots in the lower legs. Please put on in the AM when dressing and off at night when dressing for bed.  LET THEM KNOW THAT YOU NEED KNEE HIGH'S WITH COMPRESSION OF 15-20 mmhg. [employer]  [address] ([phone]); [place], [place] [address]; [phone]  EMAIL   [email or web address].  PLEASE MAKE SURE TO ELEVATE YOUR FEET & LEGS WHILE SITTING, THIS WILL HELP WITH THE SWELLING ALSO.   PLEASE READ AND FOLLOW SALTY 6-ATTACHED-1,800mg  daily  PLEASE INCREASE PHYSICAL ACTIVITY AS TOLERATED  Follow-Up: Your next appointment:  3 month(s) In Person with You will follow up in the Atrial Fibrillation Clinic located at [facility]. Your provider will be: DR Delton See    At [facility], you and your health needs are our priority.  As part of our continuing mission to provide you with exceptional heart care, we have created designated Provider Care Teams.  These Care Teams include your primary Cardiologist (physician) and Advanced Practice Providers (APPs -  Physician Assistants and Nurse Practitioners) who all work together to provide you with the care you need, when you need it.  We recommend signing up for the patient portal called "MyChart".  Sign up information is provided on this After Visit Summary.  MyChart is used to connect with  patients for Virtual Visits (Telemedicine).  Patients are able to view lab/test results, encounter notes, upcoming appointments, etc.  Non-urgent messages can be sent to your provider as well.   To learn more about what you can do with MyChart, go to [email or web address].              6 SALTY THINGS TO AVOID     1,800MG  DAILY

## 2020-04-19 NOTE — Telephone Encounter (Signed)
Patient's wife called. She would like to know what time surgery for her husband will be. Her call back number is [phone]

## 2020-04-19 NOTE — Telephone Encounter (Signed)
I spoke with Curtis Watkins and his wife. All surgery information given.

## 2020-04-22 ENCOUNTER — Other Ambulatory Visit (HOSPITAL_COMMUNITY)
Admission: RE | Admit: 2020-04-22 | Discharge: 2020-04-22 | Disposition: A | Discharge status: Home / Self Care | Payer: HRSA Program | Source: Ambulatory Visit | Attending: Orthopedic Surgery | Admitting: Orthopedic Surgery

## 2020-04-22 ENCOUNTER — Other Ambulatory Visit: Payer: Self-pay | Admitting: *Deleted

## 2020-04-22 ENCOUNTER — Encounter: Payer: Self-pay | Admitting: Cardiothoracic Surgery

## 2020-04-22 ENCOUNTER — Ambulatory Visit (INDEPENDENT_AMBULATORY_CARE_PROVIDER_SITE_OTHER): Payer: Self-pay | Admitting: Cardiothoracic Surgery

## 2020-04-22 ENCOUNTER — Other Ambulatory Visit: Payer: Self-pay

## 2020-04-22 ENCOUNTER — Other Ambulatory Visit: Payer: Self-pay | Admitting: Physician Assistant

## 2020-04-22 ENCOUNTER — Ambulatory Visit
Admission: RE | Admit: 2020-04-22 | Discharge: 2020-04-22 | Disposition: A | Discharge status: Home / Self Care | Payer: No Typology Code available for payment source | Source: Ambulatory Visit | Attending: Cardiothoracic Surgery | Admitting: Cardiothoracic Surgery

## 2020-04-22 VITALS — BP 109/76 | HR 85 | Temp 98.4°F | Resp 20 | Ht 71.0 in | Wt 179.0 lb

## 2020-04-22 DIAGNOSIS — Z951 Presence of aortocoronary bypass graft: Secondary | ICD-10-CM

## 2020-04-22 DIAGNOSIS — Z20822 Contact with and (suspected) exposure to covid-19: Secondary | ICD-10-CM | POA: Insufficient documentation

## 2020-04-22 DIAGNOSIS — J90 Pleural effusion, not elsewhere classified: Secondary | ICD-10-CM

## 2020-04-22 DIAGNOSIS — Z01812 Encounter for preprocedural laboratory examination: Secondary | ICD-10-CM | POA: Diagnosis present

## 2020-04-23 ENCOUNTER — Telehealth: Payer: Self-pay | Admitting: Cardiology

## 2020-04-23 ENCOUNTER — Ambulatory Visit (HOSPITAL_COMMUNITY): Payer: No Typology Code available for payment source

## 2020-04-23 ENCOUNTER — Encounter (HOSPITAL_COMMUNITY): Payer: Self-pay | Admitting: Orthopedic Surgery

## 2020-04-23 LAB — SARS CORONAVIRUS 2 (TAT 6-24 HRS): SARS Coronavirus 2: NEGATIVE

## 2020-04-23 NOTE — Telephone Encounter (Signed)
Dr. Delton See is listed as Primary card. I will forward this call to Dr. Lindaann Slough nurse for further follow up.

## 2020-04-23 NOTE — Anesthesia Preprocedure Evaluation (Addendum)
Anesthesia Evaluation  Patient identified by MRN, date of birth, ID band Patient awake    Reviewed: Allergy & Precautions, NPO status , Patient's Chart, lab work & pertinent test results, reviewed documented beta blocker date and time   Airway Mallampati: II  TM Distance: >3 FB Neck ROM: Full    Dental  (+) Edentulous Upper, Dental Advisory Given   Pulmonary Current Smoker,  Post-CABG CXR showed moderate pleural effusions, persistent but increased in size since 04/06/20. Note is not yet completed, but appears he is scheduled for thoracentesis with IR on 05/03/20. O2 sat 93%.   Pulmonary exam normal breath sounds clear to auscultation       Cardiovascular hypertension, Pt. on medications and Pt. on home beta blockers + CAD, + CABG (CABG x 4 04/03/20), + Peripheral Vascular Disease and +CHF (LVEF nml post-CABG (40-45% pre CABG), grade 2 diastolic dysfunction)  Normal cardiovascular exam+ Valvular Problems/Murmurs (mild MR) MR  Rhythm:Regular Rate:Normal  Echo 03/28/20 (pre-CABG): 1. Left ventricular ejection fraction, by estimation, is 40 to 45%. The  left ventricle has mildly decreased function. There is mild left  ventricular hypertrophy. Left ventricular diastolic parameters are  consistent with Grade II diastolic dysfunction  (pseudonormalization). Elevated left atrial pressure.  2. Right ventricular systolic function is normal. The right ventricular  size is normal.  3. Left atrial size was mildly dilated.  4. Mild mitral valve regurgitation.  5. The inferior vena cava is dilated in size with <50% respiratory  variability, suggesting right atrial pressure of 15 mmHg.   Post-CABG TEE: POST-OP IMPRESSIONS  - Left Ventricle: The cavity size was normal. The wall motion is normal.  - Aorta: The aorta appears unchanged from pre-bypass.  - Left Atrial Appendage: The left atrial appendage appears unchanged from  pre-bypass.  - Aortic  Valve: The aortic valve appears unchanged from pre-bypass.  - Mitral Valve: There is mild regurgitation.  - Tricuspid Valve: The tricuspid valve appears unchanged from pre-bypass.  - Interatrial Septum: The interatrial septum appears unchanged from  pre-bypass.    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   Abdominal   Peds  Hematology negative hematology ROS (+)   Anesthesia Other Findings   Reproductive/Obstetrics negative OB ROS                          Anesthesia Physical Anesthesia Plan  ASA: IV  Anesthesia Plan: MAC and Regional   Post-op Pain Management:    Induction:   PONV Risk Score and Plan: 2 and Propofol infusion and TIVA  Airway Management Planned: Natural Airway and Simple Face Mask  Additional Equipment: None  Intra-op Plan:   Post-operative Plan:   Informed Consent: I have reviewed the patients History and Physical, chart, labs and discussed the procedure including the risks, benefits and alternatives for the proposed anesthesia with the patient or authorized representative who has indicated his/her understanding and acceptance.       Plan Discussed with: CRNA  Anesthesia Plan Comments: (See PAT note written 04/23/2020 by Myra Gianotti, PA-C. S/p CABG 04/03/20, post-op pleural effusions.  )       Anesthesia Quick Evaluation

## 2020-04-23 NOTE — Telephone Encounter (Signed)
We can do that

## 2020-04-23 NOTE — Progress Notes (Signed)
Anesthesia Chart Review: SAME DAY WORK-UP   Case: [id number] Date/Time: 04/24/20 0929   Procedure: RIGHT 2ND AND 3RD TOE AMPUTATION (Right )   Anesthesia type: Choice   Pre-op diagnosis: Osteomyelitis Right 2 and 3 Toes   [facility] / [facility]   Surgeons: Nadara Mustard, MD      DISCUSSION: Patient is a 64 year old male scheduled for the above procedure. He is s/p CABG 04/03/20. Seen during that admission by Dr. 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 Amputation of those toes now recommended.   History includes smoking, CAD (NSTEMI, s/p CABG: LIMA-dLAD-DIAG, left RA-OM3-OM1 04/03/20; developed post-operative pleural effusions), CHF (new onset systolic CHF 03/27/20), ischemic cardiomyopathy, HTN, DM2, PAD (occluded right axillary artery), carotid artery disease.    Patient had post-CABG cardiology follow-up on 04/19/20 with Edd Fabian, NP. No chest pain. Euvolemic with slight ankle edema. Seeing orthopedics and podiatry for diabetic foot wounds. Increase activity as tolerated. 3 month follow-up with Dr. Delton See.  Seen by CT surgeon Dr. Vickey Sages on 04/22/20. Post-CABG CXR showed moderate pleural effusions, persistent but increased in size since 04/06/20. Note is not yet completed, but appears he is scheduled for thoracentesis with IR on 05/03/20. O2 sat 93%.  04/22/2020 presurgical COVID-19 test is negative. He had chronic pleural effusions at CT surgery follow-up as above. O2 sat > 92%. Awaiting finalized CT surgery note, but was doing well by cardiology note from 04/19/20. Thoracentesis is not scheduled until 05/03/20, suggesting not emergently indicated. He now needs 2 toes amputated.  Anesthesia type is posted as choice. Anesthesia team to evaluate on the day day of surgery.       VS:  BP Readings from Last 3 Encounters:  04/22/20 109/76  04/19/20 102/62  04/07/20 101/69   Pulse Readings from Last 3 Encounters:  04/22/20 85  04/19/20 91  04/07/20  88    PROVIDERS: Doran Stabler, NP is PCP  Tobias Alexander, MD is cardiologist Valentina Lucks, MD is CT surgeon   LABS: For day of surgery. Currently, last comparison lab results include: Lab Results  Component Value Date   WBC 11.1 (H) 04/06/2020   HGB 8.3 (L) 04/06/2020   HCT 25.1 (L) 04/06/2020   PLT 147 (L) 04/06/2020   GLUCOSE 120 (H) 04/07/2020   NA 134 (L) 04/07/2020   K 3.9 04/07/2020   CL 105 04/07/2020   CREATININE 1.22 04/07/2020   BUN 19 04/07/2020   CO2 22 04/07/2020   INR 1.4 (H) 04/03/2020   HGBA1C 6.8 (H) 03/28/2020    Spirometry 03/29/20:  Ref. Range 03/29/2020 11:42  FVC-Pre Latest Units: L 2.85  FVC-%Pred-Pre Latest Units: % 69  FEV1-Pre Latest Units: L 1.69  FEV1-%Pred-Pre Latest Units: % 53  Pre FEV1/FVC ratio Latest Units: % 59  FEV1FVC-%Pred-Pre Latest Units: % 76  FEF 25-75 Pre Latest Units: L/sec 1.37  FEF2575-%Pred-Pre Latest Units: % 48  FEV6-Pre Latest Units: L 2.70  FEV6-%Pred-Pre Latest Units: % 68  Pre FEV6/FVC Ratio Latest Units: % 98  FEV6FVC-%Pred-Pre Latest Units: % 102    IMAGES: CXR 04/22/20 (ordered by Dr. Vickey Sages): FINDINGS: Previous median sternotomy and CABG procedure. Moderate bilateral pleural effusions are identified which have increased in volume when compared with 04/06/2020. No pneumothorax identified. No interstitial edema or airspace disease. IMPRESSION: Moderate bilateral pleural effusions, increased from previous exam   EKG: 04/04/20: Normal sinus rhythm Nonspecific ST and T wave abnormality Prolonged QT [QT 408,  QTc 473 ms] Abnormal ECG Since last tracing ST-T changes less pronounced Confirmed by Jodelle Red [phone]) on 04/05/2020 12:37:40 PM   CV: TEE (intra-op CABG) 04/03/20: POST-OP IMPRESSIONS  - Left Ventricle: The cavity size was normal. The wall motion is normal.  - Aorta: The aorta appears unchanged from pre-bypass.  - Left Atrial Appendage: The left atrial appendage appears  unchanged from  pre-bypass.  - Aortic Valve: The aortic valve appears unchanged from pre-bypass.  - Mitral Valve: There is mild regurgitation.  - Tricuspid Valve: The tricuspid valve appears unchanged from pre-bypass.  - Interatrial Septum: The interatrial septum appears unchanged from  pre-bypass.  PRE-OP FINDINGS  -Left Ventricle: The left ventricle has mild-moderately reduced systolic  function, with an ejection fraction of 40-45%. The cavity size was normal.  There is mildly increased left ventricular wall thickness. Left  ventricular diffuse hypokinesis.  - Right Ventricle: The right ventricle has normal systolic function. The  cavity was normal. There is no increase in right ventricular wall  thickness.  - Left Atrium: Left atrial size was dilated. The left atrial appendage is  well visualized and there is no evidence of thrombus present. Left atrial  appendage velocity is reduced at less than 40 cm/s.  - Right Atrium: Right atrial size was normal in size.  - Interatrial Septum: No atrial level shunt detected by color flow Doppler.  - Pericardium: There is no evidence of pericardial effusion.  - Mitral Valve: The mitral valve is normal in structure. Mitral valve  regurgitation is mild by color flow Doppler. The MR jet is  centrally-directed. There is No evidence of mitral stenosis.  - Tricuspid Valve: The tricuspid valve was normal in structure. Tricuspid  valve regurgitation was not visualized by color flow Doppler.  - Aortic Valve: The aortic valve is tricuspid Aortic valve regurgitation was  not visualized by color flow Doppler. There is no evidence of aortic valve  stenosis.  - Pulmonic Valve: The pulmonic valve was normal in structure.  Pulmonic valve regurgitation is not visualized by color flow Doppler.  - Aorta: The aortic root, aortic arch and ascending aorta are normal in size  and structure.  - Pulmonary Artery: The pulmonary artery is of normal size.    Carotid  US 04/01/20: Summary:  - Right Carotid: Velocities in the right ICA are consistent with a 60-79% stenosis.  - Left Carotid: Velocities in the left ICA are consistent with a 1-39% stenosis.  - Vertebrals: Bilateral vertebral arteries demonstrate antegrade flow.    ABI 04/01/20:  Right ABI: Resting right ankle-brachial index indicates noncompressible  right lower extremity arteries.  Left ABI: Resting left ankle-brachial index indicates noncompressible left  lower extremity arteries.    Cardiac cath 03/29/20 (Pre-CABG):  Diabetic with severe two-vessel disease including the LAD and complex disease in the circumflex.  Ischemic cardiomyopathy with LVEF 35 to 40%.  LVEDP is less than 10 mmHg.  Left main is widely patent  Right coronary is widely patent but contains diffuse atherosclerosis without focal narrowing.  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  RECOMMENDATIONS:  Continue IV heparin  Continue to treat heart failure  Watch kidney function  T CTS evaluation to consider coronary artery bypass grafting with LIMA to the LAD.  SVG to circumflex or arterial graft assuming that the left radial has flow.  - S/p CABG x4: LIMA-dLAD-DIAG, left RA-OM3-OM1 04/03/20   Echo 03/28/20: IMPRESSIONS  1. Left ventricular ejection fraction, by estimation, is 40 to 45%. The  left ventricle has mildly decreased function. There is mild left  ventricular hypertrophy. Left ventricular diastolic parameters are  consistent with Grade II diastolic dysfunction  (pseudonormalization). Elevated left atrial pressure.  2. Right ventricular systolic function is normal. The right ventricular  size is normal.  3. Left atrial size was mildly dilated.  4. Mild mitral valve regurgitation.  5. The inferior vena cava is dilated in size with <50% respiratory  variability, suggesting right atrial pressure of 15 mmHg.   Past Medical History:  Diagnosis Date  . Coronary artery disease   . Diabetes  mellitus without complication (HCC)   . Hypertension     Past Surgical History:  Procedure Laterality Date  . CENTRAL VENOUS CATHETER INSERTION Left 04/03/2020   Procedure: INSERTION OF FEMORAL ARTERIAL LINE;  Surgeon: Linden Dolin, MD;  Location: [facility];  Service: Open Heart Surgery;  Laterality: Left;  . CORONARY ARTERY BYPASS GRAFT N/A 04/03/2020   Procedure: CORONARY ARTERY BYPASS GRAFTING (CABG) TIMES FOUR USING LEFT INTERNAL MAMMARY ARTERY AND LEFT RADIAL ARTERY.;  Surgeon: Linden Dolin, MD;  Location: [facility];  Service: Open Heart Surgery;  Laterality: N/A;  . CORONARY PRESSURE WIRE/FFR WITH 3D MAPPING N/A 03/29/2020   Procedure: Coronary Pressure Wire/FFR w/3D Mapping;  Surgeon: Lyn Records, MD;  Location: [facility] INVASIVE CV LAB;  Service: Cardiovascular;  Laterality: N/A;  . LEFT HEART CATH AND CORONARY ANGIOGRAPHY N/A 03/29/2020   Procedure: LEFT HEART CATH AND CORONARY ANGIOGRAPHY;  Surgeon: Lyn Records, MD;  [facility];  Service: Cardiovascular;  Laterality: N/A;  . RADIAL ARTERY HARVEST Left 04/03/2020   Procedure: LEFT RADIAL ARTERY HARVEST;  Surgeon: Linden Dolin, MD;  Location: [facility];  Service: Open Heart Surgery;  Laterality: Left;  . TEE WITHOUT CARDIOVERSION N/A 04/03/2020   Procedure: TRANSESOPHAGEAL ECHOCARDIOGRAM (TEE);  Surgeon: Linden Dolin, MD;  Location: [facility] OR;  Service: Open Heart Surgery;  Laterality: N/A;  . UPPER EXTREMITY ANGIOGRAPHY  03/29/2020   Procedure: Upper Extremity Angiography;  Surgeon: Lyn Records, MD;  Location: [facility] INVASIVE CV LAB;  Service: Cardiovascular;;  rt.  arm    MEDICATIONS: No current facility-administered medications for this encounter.   Marland Kitchen aspirin 81 MG tablet  . atorvastatin (LIPITOR) 80 MG tablet  . clopidogrel (PLAVIX) 75 MG tablet  . colchicine 0.6 MG tablet  . ferrous sulfate 325 (65 FE) MG tablet  . furosemide (LASIX) 20 MG tablet  . isosorbide dinitrate (ISORDIL) 10 MG tablet  . metFORMIN  (GLUCOPHAGE) 1000 MG tablet  . metoprolol tartrate (LOPRESSOR) 25 MG tablet  . potassium chloride (KLOR-CON) 10 MEQ tablet  . acetaminophen (TYLENOL) 500 MG tablet  . hydrocerin (EUCERIN) CREA  . oxyCODONE (OXY IR/ROXICODONE) 5 MG immediate release tablet   Based on current medication list, he is no longer taking colchicine, acetaminophen, Eucerin, oxycodone, ferrous sulfate, Isordil, Lasix (had been prescribed x5 days at discharge), KCl.    Shonna Chock, PA-C Surgical Short Stay/Anesthesiology [facility] Phone [phone] [employer] Phone [phone] 04/23/2020 12:21 PM

## 2020-04-23 NOTE — Progress Notes (Addendum)
Spoke with pt for pre-op call. Pt had CABG done 04/03/20. Pt states he's healing slowly. States he saw a colleague of Dr. Vickey Sages yesterday and was told to stop taking the Colchicine, Ferrous Sulfate, Furosemide, Isordil and Potassium. He states he was told to hold his Plavix as of yesterday for a "test" that is coming up. Last dose was 04/22/20. Pt continues his Aspirin. At this time there is not a note from that office about this information. Pt was reading this from a paper he received.  Pt is a type 2 Diabetic. Last A1C was 6.8 on 03/28/20. He states his fasting blood sugar is usually between 119-140. Instructed pt not to take his Metformin in the AM. Instructed pt to check his blood sugar when he gets up in the AM and every 2 hours until he leaves for the hospital. If blood sugar is 70 or below, treat with 1/2 cup of clear juice (apple or cranberry) and recheck blood sugar 15 minutes after drinking juice. If blood sugar continues to be 70 or below, call the Short Stay department and ask to speak to a nurse.  Covid test done 04/22/20 and it's negative. Pt states he's been in quarantine since the test was done and understands that he stays in quarantine until he comes to the hospital tomorrow.  I have called and left a message for Darius Bump, RN at Dr. Vickey Sages office to verify what pt has told me.   Addendum: 10:00 AM. Sherron Monday with Ryan at Dr. Vickey Sages' office. She is not sure about this, but will get in touch with Dr. Vickey Sages about this.

## 2020-04-23 NOTE — Telephone Encounter (Signed)
Dr. Delton See, this pt is requesting for you to provide him with a handicap placard for recent cardiac issues, S/P CABG.  You consulted on him in the hospital on 03/28/20, where he had a CABG during that time. Pt recently saw Edd Fabian NP for post-hospital follow-up from procedure.  Edd Fabian NP assigned him to you as his Primary Cards, I'm assuming not knowing you will be leaving.  Please advise if you would like to fill out his handicap placard for him.  I will have paperwork ready for you in clinic today to fill out, if you decide to provide this.

## 2020-04-23 NOTE — Telephone Encounter (Signed)
Spoke with the pt and informed him that Dr. Delton See filled out his handicap placard paperwork accordingly based on the duration and need for this assistance, and it is available for him to pick up at the front desk at his earliest convenience.  Also informed the pt that Dr. Delton See will be leaving at the beginning of April, but he will be assigned to a new Cardiologist to care for him, when he is due to be seen with our office.  Pt verbalized understanding and agrees with this plan. Pt appreciative for all the assistance provided.

## 2020-04-23 NOTE — Telephone Encounter (Signed)
Patient calling to request 2 handicap stickers.

## 2020-04-24 ENCOUNTER — Ambulatory Visit (INDEPENDENT_AMBULATORY_CARE_PROVIDER_SITE_OTHER): Payer: Self-pay | Admitting: Physician Assistant

## 2020-04-24 ENCOUNTER — Ambulatory Visit (HOSPITAL_COMMUNITY): Payer: Medicaid Other | Admitting: Vascular Surgery

## 2020-04-24 ENCOUNTER — Encounter (HOSPITAL_COMMUNITY): Payer: Self-pay | Admitting: Orthopedic Surgery

## 2020-04-24 ENCOUNTER — Other Ambulatory Visit: Payer: Self-pay

## 2020-04-24 ENCOUNTER — Encounter: Payer: Self-pay | Admitting: Physician Assistant

## 2020-04-24 ENCOUNTER — Ambulatory Visit (HOSPITAL_COMMUNITY)
Admission: RE | Admit: 2020-04-24 | Discharge: 2020-04-24 | Disposition: A | Discharge status: Home / Self Care | Payer: Medicaid Other | Attending: Orthopedic Surgery | Admitting: Orthopedic Surgery

## 2020-04-24 ENCOUNTER — Encounter (HOSPITAL_COMMUNITY)
Admission: RE | Disposition: A | Discharge status: Home / Self Care | Payer: Self-pay | Source: Home / Self Care | Attending: Orthopedic Surgery

## 2020-04-24 DIAGNOSIS — M869 Osteomyelitis, unspecified: Secondary | ICD-10-CM | POA: Diagnosis not present

## 2020-04-24 DIAGNOSIS — I509 Heart failure, unspecified: Secondary | ICD-10-CM | POA: Insufficient documentation

## 2020-04-24 DIAGNOSIS — E1169 Type 2 diabetes mellitus with other specified complication: Secondary | ICD-10-CM | POA: Diagnosis not present

## 2020-04-24 DIAGNOSIS — I110 Hypertensive heart disease with heart failure: Secondary | ICD-10-CM | POA: Insufficient documentation

## 2020-04-24 DIAGNOSIS — I255 Ischemic cardiomyopathy: Secondary | ICD-10-CM | POA: Diagnosis not present

## 2020-04-24 DIAGNOSIS — Z951 Presence of aortocoronary bypass graft: Secondary | ICD-10-CM | POA: Insufficient documentation

## 2020-04-24 DIAGNOSIS — Z7984 Long term (current) use of oral hypoglycemic drugs: Secondary | ICD-10-CM | POA: Insufficient documentation

## 2020-04-24 DIAGNOSIS — F17210 Nicotine dependence, cigarettes, uncomplicated: Secondary | ICD-10-CM | POA: Insufficient documentation

## 2020-04-24 DIAGNOSIS — Z7982 Long term (current) use of aspirin: Secondary | ICD-10-CM | POA: Insufficient documentation

## 2020-04-24 DIAGNOSIS — I96 Gangrene, not elsewhere classified: Secondary | ICD-10-CM

## 2020-04-24 DIAGNOSIS — J90 Pleural effusion, not elsewhere classified: Secondary | ICD-10-CM | POA: Diagnosis not present

## 2020-04-24 DIAGNOSIS — E1151 Type 2 diabetes mellitus with diabetic peripheral angiopathy without gangrene: Secondary | ICD-10-CM | POA: Diagnosis not present

## 2020-04-24 HISTORY — DX: Atherosclerotic heart disease of native coronary artery without angina pectoris: I25.10

## 2020-04-24 HISTORY — DX: Ischemic cardiomyopathy: I25.5

## 2020-04-24 HISTORY — DX: Heart failure, unspecified: I50.9

## 2020-04-24 HISTORY — DX: Peripheral vascular disease, unspecified: I73.9

## 2020-04-24 HISTORY — DX: Disorder of arteries and arterioles, unspecified: I77.9

## 2020-04-24 HISTORY — PX: AMPUTATION: SHX166

## 2020-04-24 LAB — BASIC METABOLIC PANEL
Anion gap: 10 (ref 5–15)
BUN: 21 mg/dL (ref 8–23)
CO2: 24 mmol/L (ref 22–32)
Calcium: 8.8 mg/dL — ABNORMAL LOW (ref 8.9–10.3)
Chloride: 107 mmol/L (ref 98–111)
Creatinine, Ser: 1.26 mg/dL — ABNORMAL HIGH (ref 0.61–1.24)
GFR, Estimated: >60 mL/min (ref >60)
Glucose, Bld: 154 mg/dL — ABNORMAL HIGH (ref 70–99)
Potassium: 3.6 mmol/L (ref 3.5–5.1)
Sodium: 141 mmol/L (ref 135–145)

## 2020-04-24 LAB — GLUCOSE, CAPILLARY
Glucose-Capillary: 135 mg/dL — ABNORMAL HIGH (ref 70–99)
Glucose-Capillary: 148 mg/dL — ABNORMAL HIGH (ref 70–99)

## 2020-04-24 SURGERY — AMPUTATION DIGIT
Anesthesia: Monitor Anesthesia Care | Laterality: Right

## 2020-04-24 MED ORDER — FENTANYL CITRATE (PF) 250 MCG/5ML IJ SOLN
INTRAMUSCULAR | Status: AC
  Filled 2020-04-24: qty 5

## 2020-04-24 MED ORDER — MIDAZOLAM HCL 2 MG/2ML IJ SOLN
INTRAMUSCULAR | Status: AC
  Filled 2020-04-24: qty 2

## 2020-04-24 MED ORDER — CEFAZOLIN SODIUM-DEXTROSE 2-4 GM/100ML-% IV SOLN
2.0000 g | INTRAVENOUS | Status: AC
  Administered 2020-04-24: 2 g via INTRAVENOUS

## 2020-04-24 MED ORDER — CHLORHEXIDINE GLUCONATE 0.12 % MT SOLN
OROMUCOSAL | Status: AC
  Administered 2020-04-24: 15 mL via OROMUCOSAL
  Filled 2020-04-24: qty 15

## 2020-04-24 MED ORDER — ORAL CARE MOUTH RINSE: 15.0000 mL | Freq: Once | OROMUCOSAL | Status: AC

## 2020-04-24 MED ORDER — CEFAZOLIN SODIUM-DEXTROSE 2-4 GM/100ML-% IV SOLN
INTRAVENOUS | Status: AC
  Filled 2020-04-24: qty 100

## 2020-04-24 MED ORDER — LIDOCAINE 2% (20 MG/ML) 5 ML SYRINGE
INTRAMUSCULAR | Status: AC
  Filled 2020-04-24: qty 5

## 2020-04-24 MED ORDER — PROPOFOL 500 MG/50ML IV EMUL
INTRAVENOUS | Status: DC | PRN
  Administered 2020-04-24: 25 ug/kg/min via INTRAVENOUS

## 2020-04-24 MED ORDER — PHENYLEPHRINE HCL-NACL 10-0.9 MG/250ML-% IV SOLN
INTRAVENOUS | Status: AC
  Filled 2020-04-24: qty 250

## 2020-04-24 MED ORDER — ROPIVACAINE HCL 5 MG/ML IJ SOLN
INTRAMUSCULAR | Status: DC | PRN
  Administered 2020-04-24: 40 mL via PERINEURAL

## 2020-04-24 MED ORDER — OXYCODONE-ACETAMINOPHEN 5-325 MG PO TABS: 1.0000 | ORAL_TABLET | ORAL | 0 refills | Status: DC | PRN

## 2020-04-24 MED ORDER — FENTANYL CITRATE (PF) 100 MCG/2ML IJ SOLN: 50.0000 ug | Freq: Once | INTRAMUSCULAR | Status: AC

## 2020-04-24 MED ORDER — MIDAZOLAM HCL 2 MG/2ML IJ SOLN
INTRAMUSCULAR | Status: AC
  Administered 2020-04-24: 1 mg via INTRAVENOUS
  Filled 2020-04-24: qty 2

## 2020-04-24 MED ORDER — DEXAMETHASONE SODIUM PHOSPHATE 10 MG/ML IJ SOLN
INTRAMUSCULAR | Status: DC | PRN
  Administered 2020-04-24: 10 mg

## 2020-04-24 MED ORDER — ONDANSETRON HCL 4 MG/2ML IJ SOLN
INTRAMUSCULAR | Status: AC
  Filled 2020-04-24: qty 2

## 2020-04-24 MED ORDER — PROPOFOL 10 MG/ML IV BOLUS
INTRAVENOUS | Status: AC
  Filled 2020-04-24: qty 20

## 2020-04-24 MED ORDER — MIDAZOLAM HCL 2 MG/2ML IJ SOLN: 1.0000 mg | Freq: Once | INTRAMUSCULAR | Status: AC

## 2020-04-24 MED ORDER — ACETAMINOPHEN 500 MG PO TABS
1000.0000 mg | ORAL_TABLET | Freq: Once | ORAL | Status: AC
  Administered 2020-04-24: 1000 mg via ORAL
  Filled 2020-04-24: qty 2

## 2020-04-24 MED ORDER — LACTATED RINGERS IV SOLN: INTRAVENOUS | Status: DC

## 2020-04-24 MED ORDER — PROPOFOL 1000 MG/100ML IV EMUL
INTRAVENOUS | Status: AC
  Filled 2020-04-24: qty 100

## 2020-04-24 MED ORDER — FENTANYL CITRATE (PF) 100 MCG/2ML IJ SOLN
INTRAMUSCULAR | Status: AC
  Administered 2020-04-24: 50 ug via INTRAVENOUS
  Filled 2020-04-24: qty 2

## 2020-04-24 MED ORDER — CHLORHEXIDINE GLUCONATE 0.12 % MT SOLN: 15.0000 mL | Freq: Once | OROMUCOSAL | Status: AC

## 2020-04-24 MED ORDER — 0.9 % SODIUM CHLORIDE (POUR BTL) OPTIME
TOPICAL | Status: DC | PRN
  Administered 2020-04-24: 1000 mL

## 2020-04-24 MED ORDER — ONDANSETRON HCL 4 MG/2ML IJ SOLN
INTRAMUSCULAR | Status: DC | PRN
  Administered 2020-04-24: 4 mg via INTRAVENOUS

## 2020-04-24 SURGICAL SUPPLY — 31 items
BLADE SURG 21 STRL SS (BLADE) ×2 IMPLANT
BNDG COHESIVE 4X5 TAN STRL (GAUZE/BANDAGES/DRESSINGS) ×2 IMPLANT
BNDG ESMARK 4X9 LF (GAUZE/BANDAGES/DRESSINGS) IMPLANT
BNDG GAUZE ELAST 4 BULKY (GAUZE/BANDAGES/DRESSINGS) ×2 IMPLANT
COVER SURGICAL LIGHT HANDLE (MISCELLANEOUS) ×4 IMPLANT
COVER WAND RF STERILE (DRAPES) ×2 IMPLANT
DRAPE U-SHAPE 47X51 STRL (DRAPES) ×2 IMPLANT
DRSG ADAPTIC 3X8 NADH LF (GAUZE/BANDAGES/DRESSINGS) IMPLANT
DRSG EMULSION OIL 3X3 NADH (GAUZE/BANDAGES/DRESSINGS) ×2 IMPLANT
DRSG PAD ABDOMINAL 8X10 ST (GAUZE/BANDAGES/DRESSINGS) ×2 IMPLANT
DURAPREP 26ML APPLICATOR (WOUND CARE) ×2 IMPLANT
ELECT REM PT RETURN 9FT ADLT (ELECTROSURGICAL) ×2
ELECTRODE REM PT RTRN 9FT ADLT (ELECTROSURGICAL) ×1 IMPLANT
GAUZE SPONGE 4X4 12PLY STRL (GAUZE/BANDAGES/DRESSINGS) IMPLANT
GAUZE SPONGE 4X4 16PLY XRAY LF (GAUZE/BANDAGES/DRESSINGS) ×2 IMPLANT
GLOVE BIOGEL PI IND STRL 9 (GLOVE) ×1 IMPLANT
GLOVE BIOGEL PI INDICATOR 9 (GLOVE) ×1
GLOVE SURG ORTHO 9.0 STRL STRW (GLOVE) ×2 IMPLANT
GOWN STRL REUS W/ TWL XL LVL3 (GOWN DISPOSABLE) ×2 IMPLANT
GOWN STRL REUS W/TWL XL LVL3 (GOWN DISPOSABLE) ×2
KIT BASIN OR (CUSTOM PROCEDURE TRAY) ×2 IMPLANT
KIT TURNOVER KIT B (KITS) ×2 IMPLANT
MANIFOLD NEPTUNE II (INSTRUMENTS) ×2 IMPLANT
NEEDLE 22X1 1/2 (OR ONLY) (NEEDLE) IMPLANT
NS IRRIG 1000ML POUR BTL (IV SOLUTION) ×2 IMPLANT
PACK ORTHO EXTREMITY (CUSTOM PROCEDURE TRAY) ×2 IMPLANT
PAD ABD 8X10 STRL (GAUZE/BANDAGES/DRESSINGS) ×2 IMPLANT
PAD ARMBOARD 7.5X6 YLW CONV (MISCELLANEOUS) ×4 IMPLANT
SUT ETHILON 2 0 PSLX (SUTURE) ×2 IMPLANT
SYR CONTROL 10ML LL (SYRINGE) IMPLANT
TOWEL GREEN STERILE (TOWEL DISPOSABLE) ×2 IMPLANT

## 2020-04-24 NOTE — Progress Notes (Signed)
Orthopedic Tech Progress Note Patient Details:  Curtis Watkins 07/06/1955 [id number]  Ortho Devices Type of Ortho Device: Crutches,Postop shoe/boot Ortho Device/Splint Location: RLE Ortho Device/Splint Interventions: Ordered,Application,Adjustment   Post Interventions Patient Tolerated: Fair Instructions Provided: Adjustment of device,Care of device,Poper ambulation with device   [name] 04/24/2020, 11:05 AM

## 2020-04-24 NOTE — Anesthesia Postprocedure Evaluation (Signed)
Anesthesia Post Note  Patient: Curtis Watkins  Procedure(s) Performed: RIGHT 2ND AND 3RD TOE AMPUTATION (Right )     Patient location during evaluation: PACU Anesthesia Type: Regional and MAC Level of consciousness: awake and alert Pain management: pain level controlled Vital Signs Assessment: post-procedure vital signs reviewed and stable Respiratory status: spontaneous breathing, nonlabored ventilation and respiratory function stable Cardiovascular status: blood pressure returned to baseline and stable Postop Assessment: no apparent nausea or vomiting Anesthetic complications: no   No complications documented.  Last Vitals:  Vitals:   04/24/20 1015 04/24/20 1019  BP:  114/81  Pulse: 81 81  Resp: 18 (!) 22  Temp:  (!) 36.2 C  SpO2: 96% 96%    Last Pain:  Vitals:   04/24/20 1019  TempSrc:   PainSc: 0-No pain                 Pervis Hocking

## 2020-04-24 NOTE — Op Note (Addendum)
04/24/2020  9:54 AM  PATIENT:  Curtis Watkins    PRE-OPERATIVE DIAGNOSIS:  Osteomyelitis Right 2 and 3 Toes  POST-OPERATIVE DIAGNOSIS:  Same  PROCEDURE:  RIGHT 2ND AND 3RD RAY AMPUTATION  SURGEON:  Nadara Mustard, MD  PHYSICIAN ASSISTANT:None ANESTHESIA:   General  PREOPERATIVE INDICATIONS:  Curtis Watkins is a  65 y.o. male with a diagnosis of Osteomyelitis Right 2 and 3 Toes who failed conservative measures and elected for surgical management.    The risks benefits and alternatives were discussed with the patient preoperatively including but not limited to the risks of infection, bleeding, nerve injury, cardiopulmonary complications, the need for revision surgery, among others, and the patient was willing to proceed.  OPERATIVE IMPLANTS: none  @ENCIMAGES @  OPERATIVE FINDINGS: Good petechial bleeding no abscess or necrotic tissue at the level of amputation.  OPERATIVE PROCEDURE: Patient was brought the operating room after undergoing a regional anesthetic.  After adequate levels anesthesia were obtained patient's right lower extremity was prepped using DuraPrep draped into a sterile field a timeout was called.  A the incision was made centered over the second and third toes this was carried down to bone and the second and third toes were amputated through the metatarsals with a partial ray amputation for the second and third metatarsals.  The bone was beveled plantarly the wound was irrigated with normal saline hemostasis was obtained and the incision was closed using 2-0 nylon a sterile dressing was applied patient was taken the PACU in stable condition   DISCHARGE PLANNING:  Antibiotic duration: Preoperative antibiotics  Weightbearing: Touchdown weightbearing on the right  Pain medication: Prescription for Percocet  Dressing care/ Wound VAC: Follow-up in the office 1 week to change the dressing  Ambulatory devices: Crutches  Discharge to: Home.  Follow-up: In the office 1 week  post operative.

## 2020-04-24 NOTE — Anesthesia Procedure Notes (Signed)
   Anesthesia Regional Block: Adductor canal block   Pre-Anesthetic Checklist: ,, timeout performed, Correct Patient, Correct Site, Correct Laterality, Correct Procedure, Correct Position, site marked, Risks and benefits discussed,  Surgical consent,  Pre-op evaluation,  At surgeon's request and post-op pain management  Laterality: Right  Prep: Maximum Sterile Barrier Precautions used, chloraprep       Needles:  Injection technique: Single-shot  Needle Type: Echogenic Stimulator Needle     Needle Length: 9cm  Needle Gauge: 22     Additional Needles:   Procedures:,,,, ultrasound used (permanent image in chart),,,,  Narrative:  Start time: 04/24/2020 9:10 AM End time: 04/24/2020 9:15 AM Injection made incrementally with aspirations every 5 mL.  Performed by: Personally  Anesthesiologist: Lannie Fields, DO  Additional Notes: Monitors applied. No increased pain on injection. No increased resistance to injection. Injection made in 5cc increments. Good needle visualization. Patient tolerated procedure well.

## 2020-04-24 NOTE — Anesthesia Procedure Notes (Signed)
Anesthesia Regional Block: Popliteal block   Pre-Anesthetic Checklist: ,, timeout performed, Correct Patient, Correct Site, Correct Laterality, Correct Procedure, Correct Position, site marked, Risks and benefits discussed,  Surgical consent,  Pre-op evaluation,  At surgeon's request and post-op pain management  Laterality: Right  Prep: Maximum Sterile Barrier Precautions used, chloraprep       Needles:  Injection technique: Single-shot  Needle Type: Echogenic Stimulator Needle     Needle Length: 9cm  Needle Gauge: 22     Additional Needles:   Procedures:,,,, ultrasound used (permanent image in chart),,,,  Narrative:  Start time: 04/24/2020 9:05 AM End time: 04/24/2020 9:10 AM Injection made incrementally with aspirations every 5 mL.  Performed by: Personally  Anesthesiologist: Lannie Fields, DO  Additional Notes: Monitors applied. No increased pain on injection. No increased resistance to injection. Injection made in 5cc increments. Good needle visualization. Patient tolerated procedure well.

## 2020-04-24 NOTE — H&P (Signed)
Curtis Watkins is an 65 y.o. male.   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  Patient was initially seen in the emergency room and presents at this time for initial evaluation.  Past Medical History:  Diagnosis Date  . Carotid artery disease (HCC)    04/01/20: 60-79% RICA stenosis, 1-39% LICA stenosis  . CHF (congestive heart failure) (HCC)   . Coronary artery disease   . Diabetes mellitus without complication (HCC)   . Hypertension   . Ischemic cardiomyopathy   . PAD (peripheral artery disease) (HCC)    occluded right axillary artery 03/29/20     Past Surgical History:  Procedure Laterality Date  . CENTRAL VENOUS CATHETER INSERTION Left 04/03/2020   Procedure: INSERTION OF FEMORAL ARTERIAL LINE;  Surgeon: Linden Dolin, MD;  [facility]: [facility] OR;  Service: Open Heart Surgery;  Laterality: Left;  . CORONARY ARTERY BYPASS GRAFT N/A 04/03/2020   Procedure: CORONARY ARTERY BYPASS GRAFTING (CABG) TIMES FOUR USING LEFT INTERNAL MAMMARY ARTERY AND LEFT RADIAL ARTERY.;  Surgeon: Linden Dolin, MD;  [facility] OR;  Service: Open Heart Surgery;  Laterality: N/A;  . CORONARY PRESSURE WIRE/FFR WITH 3D MAPPING N/A 03/29/2020   Procedure: Coronary Pressure Wire/FFR w/3D Mapping;  Surgeon: Lyn Records, MD;  [facility] INVASIVE CV LAB;  Service: Cardiovascular;  Laterality: N/A;  . LEFT HEART CATH AND CORONARY ANGIOGRAPHY N/A 03/29/2020   Procedure: LEFT HEART CATH AND CORONARY ANGIOGRAPHY;  Surgeon: Lyn Records, MD;  [facility] INV[facility];  Service: Cardiovascular;  Laterality: N/A;  . RADIAL ARTERY HARVEST Left 04/03/2020   Procedure: LEFT RADIAL ARTERY HARVEST;  Surgeon: Linden Dolin, MD;  [facility]: [facility] OR;  Service: Open Heart Surgery;  Laterality: Left;  . TEE WITHOUT CARDIOVERSION N/A 04/03/2020   Procedure: TRANSESOPHAGEAL ECHOCARDIOGRAM (TEE);   Surgeon: Linden Dolin, MD;  Location: [facility] OR;  Service: Open Heart Surgery;  Laterality: N/A;  . UPPER EXTREMITY ANGIOGRAPHY  03/29/2020   Procedure: Upper Extremity Angiography;  Surgeon: Lyn Records, MD;  Location: [facility] INVASIVE CV LAB;  Service: Cardiovascular;;  rt.  arm    History reviewed. No pertinent family history. Social History:  reports that he has been smoking cigarettes. He has been smoking about 0.50 packs per day. He has never used smokeless tobacco. He reports previous alcohol use. He reports previous drug use. Frequency: 3.00 times per week. Drug: Marijuana.  Allergies: No Known Allergies  No medications prior to admission.    Results for orders placed or performed during the hospital encounter of 04/22/20 (from the past 48 hour(s))  SARS CORONAVIRUS 2 (TAT 6-24 HRS) Nasopharyngeal Nasopharyngeal Swab     Status: None   Collection Time: 04/22/20  1:15 PM   Specimen: Nasopharyngeal Swab  Result Value Ref Range   SARS Coronavirus 2 NEGATIVE NEGATIVE    Comment: (NOTE) SARS-CoV-2 target nucleic acids are NOT DETECTED.  The SARS-CoV-2 RNA is generally detectable in upper and lower respiratory specimens during the acute phase of infection. Negative results do not preclude SARS-CoV-2 infection, do not rule out co-infections with other pathogens, and should not be used as the sole basis for treatment or other patient management decisions. Negative results must be combined with clinical observations, patient history, and epidemiological information. The expected result is Negative.  Fact Sheet for Patients: HairSlick.no  Fact Sheet for Healthcare Providers: quierodirigir.com  This test is not yet approved or cleared by the [place] and  has been authorized for detection and/or diagnosis of SARS-CoV-2 by FDA under an Emergency Use Authorization (EUA). This EUA will remain  in effect (meaning this test  can be used) for the duration of the COVID-19 declaration under Se ction 564(b)(1) of the Act, 21 U.S.C. section 360bbb-3(b)(1), unless the authorization is terminated or revoked sooner.  Performed at [facility], [address], [place], [place] [address]    DG Chest 2 View  Result Date: 04/22/2020 CLINICAL DATA:  Status post CABG. EXAM: CHEST - 2 VIEW COMPARISON:  04/06/2020 FINDINGS: Previous median sternotomy and CABG procedure. Moderate bilateral pleural effusions are identified which have increased in volume when compared with 04/06/2020. No pneumothorax identified. No interstitial edema or airspace disease. IMPRESSION: Moderate bilateral pleural effusions, increased from previous exam Electronically Signed   By: Signa Kell M.D.   On: 04/22/2020 11:59    Review of Systems  All other systems reviewed and are negative.   There were no vitals taken for this visit. Physical Exam  normal affect, normal respiratory effort. Examination patient has a good dorsalis pedis pulse bilaterally.  He has massive ulcerations across the forefoot bilaterally.  After informed consent a 10 blade knife was used to abrade the skin and soft tissue back to healthy viable tissue this was touched with silver nitrate after debridement the ulcers were 5 cm in diameter 5 mm deep across the forefoot of both feet.  Predebridement the ulcers were 10 mm in diameter with proud callus.  He has thickened discolored onychomycotic nails and the nails were trimmed x10 without complications he has sausage digit swelling and dark discoloration of the second and third toes of the right foot.  Review of the radiographs shows destruction of the tuft of the second and third toes.  There is ulceration that goes down to the toe on both feet.Heart RRR Lungs Clear 1. Chronic osteomyelitis of toe, right (HCC)     Plan: We will plan for amputation of the right foot second and third toes through the MTP joint.  Discussed the  importance of nonweightbearing postoperatively.  Risk and benefits were discussed including risk of the wound not healing need for additional surgery.  Patient states he understands wished to proceed with surgery soon as possible.  Assessment/Plan   [place] Junious Ragone, PA 04/24/2020, 5:38 AM

## 2020-04-24 NOTE — Progress Notes (Signed)
Office Visit Note   Patient: Curtis Watkins           Date of Birth: 01/05/1956           MRN: [id number] Visit Date: 04/24/2020              Requested by: Doran Stabler, NP [phone] [place],  [place] [address] PCP: Doran Stabler, NP  Chief Complaint  Patient presents with  . Right Foot - Routine Post Op    Surgery today 2nd and 3rd toe amputations excessive bleeding soaked surgical dressing       HPI: Patient is status post second and third toe amputations earlier today.  Family called the office because he was having significant bleeding from the wound.  It had soaked through the dressing and onto the floor.  He does have neuropathy in his family is not sure whether he was placing weight on his foot.  Assessment & Plan: Visit Diagnoses: No diagnosis found.  Plan: Compression wrap was applied today with gauze and Coban.  He was in the office for over 30 minutes and there was no more bleeding.  Dry dressing was applied.  We will contact him tomorrow.  He understands he is to be nonweightbearing and elevating his foot  Follow-Up Instructions: No follow-ups on file.   Ortho Exam  Patient is alert, oriented, no adenopathy, well-dressed, normal affect, normal respiratory effort. Examination.  No wound dehiscence he does have  bleeding from a small skin perforator.  Imaging: No results found. No images are attached to the encounter.  Labs: Lab Results  Component Value Date   HGBA1C 6.8 (H) 03/28/2020     No results found for: ALBUMIN, PREALBUMIN, LABURIC  Lab Results  Component Value Date   MG 2.6 (H) 04/04/2020   MG 2.6 (H) 04/04/2020   MG 2.9 (H) 04/03/2020   No results found for: VD25OH  No results found for: PREALBUMIN CBC EXTENDED Latest Ref Rng & Units 04/06/2020 04/05/2020 04/04/2020  WBC 4.0 - 10.5 K/uL 11.1(H) 10.7(H) 12.6(H)  RBC 4.22 - 5.81 MIL/uL 2.75(L) 2.53(L) 2.68(L)  HGB 13.0 - 17.0 g/dL 8.3(L) 8.1(L) 8.3(L)  HCT 39.0 - 52.0  % 25.1(L) 23.4(L) 24.8(L)  PLT 150 - 400 K/uL 147(L) 124(L) 137(L)     There is no height or weight on file to calculate BMI.  Orders:  No orders of the defined types were placed in this encounter.  No orders of the defined types were placed in this encounter.    Procedures: No procedures performed  Clinical Data: No additional findings.  ROS:  All other systems negative, except as noted in the HPI. Review of Systems  Objective: Vital Signs: There were no vitals taken for this visit.  Specialty Comments:  No specialty comments available.  PMFS History: Patient Active Problem List   Diagnosis Date Noted  . Gangrene of toe of right foot (HCC)   . 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 Coronary artery disease involving native coronary artery of native heart with unstable angina pectoris (HCC)   . Acute congestive heart failure (HCC) 03/27/2020  . Diabetic foot ulcers (HCC) 03/27/2020  . DM2 (diabetes mellitus, type 2) (HCC) 03/27/2020  . HTN (hypertension) 03/27/2020   Past Medical History:  Diagnosis Date  . Carotid artery disease (HCC)    04/01/20: 60-79% RICA stenosis, 1-39% LICA stenosis  . CHF (congestive heart failure) (HCC)   . Coronary artery disease   . Diabetes mellitus  without complication (HCC)   . Hypertension   . Ischemic cardiomyopathy   . PAD (peripheral artery disease) (HCC)    occluded right axillary artery 03/29/20     No family history on file.  Past Surgical History:  Procedure Laterality Date  . CENTRAL VENOUS CATHETER INSERTION Left 04/03/2020   Procedure: INSERTION OF FEMORAL ARTERIAL LINE;  Surgeon: Linden Dolin, MD;  [facility];  Service: Open Heart Surgery;  Laterality: Left;  . CORONARY ARTERY BYPASS GRAFT N/A 04/03/2020   Procedure: CORONARY ARTERY BYPASS GRAFTING (CABG) TIMES FOUR USING LEFT INTERNAL MAMMARY ARTERY AND LEFT RADIAL ARTERY.;  Surgeon: Linden Dolin, MD;  [facility];  Service: Open Heart Surgery;  Laterality: N/A;  .  CORONARY PRESSURE WIRE/FFR WITH 3D MAPPING N/A 03/29/2020   Procedure: Coronary Pressure Wire/FFR w/3D Mapping;  Surgeon: Lyn Records, MD;  [facility] INVAS[facility] CV LAB;  Service: Cardiovascular;  Laterality: N/A;  . LEFT HEART CATH AND CORONARY ANGIOGRAPHY N/A 03/29/2020   Procedure: LEFT HEART CATH AND CORONARY ANGIOGRAPHY;  Surgeon: Lyn Records, MD;  [facility];  Service: Cardiovascular;  Laterality: N/A;  . RADIAL ARTERY HARVEST Left 04/03/2020   Procedure: LEFT RADIAL ARTERY HARVEST;  Surgeon: Linden Dolin, MD;  [facility] OR;  Service: Open Heart Surgery;  Laterality: Left;  . TEE WITHOUT CARDIOVERSION N/A 04/03/2020   Procedure: TRANSESOPHAGEAL ECHOCARDIOGRAM (TEE);  Surgeon: Linden Dolin, MD;  Location: [facility] OR;  Service: Open Heart Surgery;  Laterality: N/A;  . UPPER EXTREMITY ANGIOGRAPHY  03/29/2020   Procedure: Upper Extremity Angiography;  Surgeon: Lyn Records, MD;  Location: [facility] INVASIVE CV LAB;  Service: Cardiovascular;;  rt.  arm   Social History   Occupational History  . Not on file  Tobacco Use  . Smoking status: Current Every Day Smoker    Packs/day: 0.50    Types: Cigarettes  . Smokeless tobacco: Never Used  Substance and Sexual Activity  . Alcohol use: Not Currently  . Drug use: Not Currently    Frequency: 3.0 times per week    Types: Marijuana  . Sexual activity: Not Currently

## 2020-04-24 NOTE — Transfer of Care (Signed)
Immediate Anesthesia Transfer of Care Note  Patient: Curtis Watkins  Procedure(s) Performed: RIGHT 2ND AND 3RD TOE AMPUTATION (Right )  Patient Location: PACU  Anesthesia Type:MAC combined with regional for post-op pain  Level of Consciousness: awake, alert , oriented and patient cooperative  Airway & Oxygen Therapy: Patient Spontanous Breathing and Patient connected to nasal cannula oxygen  Post-op Assessment: Report given to RN and Post -op Vital signs reviewed and stable  Post vital signs: Reviewed and stable  Last Vitals:  Vitals Value Taken Time  BP 114/73 04/24/20 0948  Temp    Pulse 81 04/24/20 0949  Resp 24 04/24/20 0949  SpO2 97 % 04/24/20 0949  Vitals shown include unvalidated device data.  Last Pain:  Vitals:   04/24/20 0822  TempSrc:   PainSc: 0-No pain         Complications: No complications documented.

## 2020-04-24 NOTE — Interval H&P Note (Signed)
History and Physical Interval Note:  04/24/2020 9:19 AM  Curtis Watkins  has presented today for surgery, with the diagnosis of Osteomyelitis Right 2 and 3 Toes.  The various methods of treatment have been discussed with the patient and family. After consideration of risks, benefits and other options for treatment, the patient has consented to  Procedure(s): RIGHT 2ND AND 3RD TOE AMPUTATION (Right) as a surgical intervention.  The patient's history has been reviewed, patient examined, no change in status, stable for surgery.  I have reviewed the patient's chart and labs.  Questions were answered to the patient's satisfaction.     Nadara Mustard

## 2020-04-24 NOTE — Progress Notes (Signed)
Patient recovered. Awaiting call from significant other for discharge instructions.

## 2020-04-25 ENCOUNTER — Encounter (HOSPITAL_COMMUNITY): Payer: Self-pay | Admitting: Orthopedic Surgery

## 2020-04-25 NOTE — Progress Notes (Signed)
      [address]       [place] [address]             [phone]     CARDIOTHORACIC SURGERY OFFICE NOTE  Referring Provider is Mikki Santee Key, * Primary Cardiologist is Tobias Alexander, MD PCP is Doran Stabler, NP   HPI:  65 year old man underwent CABG x4 on 04/03/2020.  He did well after surgery and was discharged a few days later.  He now presents for his initial postoperative visit.  He reports some shortness of breath with exertion.  He denies chest pain or fevers.   Current Outpatient Medications  Medication Sig Dispense Refill  . aspirin 81 MG tablet Take 1 tablet (81 mg total) by mouth daily.    Marland Kitchen atorvastatin (LIPITOR) 80 MG tablet Take 1 tablet (80 mg total) by mouth daily. 30 tablet 1  . hydrocerin (EUCERIN) CREA Apply 113 application topically daily. 113 g 0  . metFORMIN (GLUCOPHAGE) 1000 MG tablet Take 1,000 mg by mouth daily with breakfast.    . metoprolol tartrate (LOPRESSOR) 25 MG tablet Take 0.5 tablets (12.5 mg total) by mouth 2 (two) times daily. 30 tablet 1  . oxyCODONE-acetaminophen (PERCOCET) 5-325 MG tablet Take 1 tablet by mouth every 4 (four) hours as needed. 30 tablet 0   No current facility-administered medications for this visit.      Physical Exam:   BP 109/76 (BP Location: Left Arm, Patient Position: Sitting)   Pulse 85   Temp 98.4 F (36.9 C)   Resp 20   Ht 5\' 11"  (1.803 m)   Wt 81.2 kg   SpO2 93% Comment: RA  BMI 24.97 kg/m   General:  Well-appearing no acute distress  Chest:   Clear to auscultation bilaterally except at bases  CV:   Regular rate and rhythm  Incisions:  Healing well; staples are removed from the left forearm and the wound is Steri-Stripped  Abdomen:  Soft nontender  Extremities:  Warm and well-perfused  Diagnostic Tests:  Chest x-ray shows small pleural effusions bilaterally   Impression:  Doing well after coronary bypass grafting, but with a small bilateral pleural  effusions  Plan:  Refer to radiology for image guided thoracentesis  follow-up with thoracic surgery after thoracentesis Okay to stop colchicine, iron, Lasix, potassium, and Isordil   I spent in excess of 20 minutes during the conduct of this office consultation and >50% of this time involved direct face-to-face encounter with the patient for counseling and/or coordination of their care.  Level 2                 10 minutes Level 3                 15 minutes Level 4                 25 minutes Level 5                 40 minutes  B.  , MD 04/25/2020 11:44 AM

## 2020-04-26 ENCOUNTER — Ambulatory Visit (HOSPITAL_COMMUNITY): Payer: Self-pay

## 2020-04-29 ENCOUNTER — Ambulatory Visit: Payer: Self-pay | Admitting: Cardiothoracic Surgery

## 2020-04-30 ENCOUNTER — Other Ambulatory Visit (HOSPITAL_COMMUNITY)
Admission: RE | Admit: 2020-04-30 | Discharge: 2020-04-30 | Disposition: A | Discharge status: Home / Self Care | Payer: Self-pay | Source: Ambulatory Visit | Attending: Cardiothoracic Surgery | Admitting: Cardiothoracic Surgery

## 2020-04-30 DIAGNOSIS — Z20822 Contact with and (suspected) exposure to covid-19: Secondary | ICD-10-CM | POA: Insufficient documentation

## 2020-04-30 DIAGNOSIS — Z01812 Encounter for preprocedural laboratory examination: Secondary | ICD-10-CM | POA: Insufficient documentation

## 2020-05-01 ENCOUNTER — Ambulatory Visit (INDEPENDENT_AMBULATORY_CARE_PROVIDER_SITE_OTHER): Payer: Self-pay | Admitting: Physician Assistant

## 2020-05-01 ENCOUNTER — Encounter: Payer: Self-pay | Admitting: Physician Assistant

## 2020-05-01 DIAGNOSIS — I96 Gangrene, not elsewhere classified: Secondary | ICD-10-CM

## 2020-05-01 LAB — SARS CORONAVIRUS 2 (TAT 6-24 HRS): SARS Coronavirus 2: NEGATIVE

## 2020-05-01 NOTE — Progress Notes (Signed)
Office Visit Note   Patient: Curtis Watkins           Date of Birth: [birth year]           MRN: [id number] Visit Date: 05/01/2020              Requested by: Doran Stabler, NP [phone] [place],  [place] [address] PCP: Doran Stabler, NP  Chief Complaint  Patient presents with  . Right Foot - Follow-up      HPI: Patient is a 65 year old gentleman who is 1 week status post right second and third toe amputations.  He did have quite a bit of bleeding after surgery but this is improved per his wife.  He also has a history of bilateral venous insufficiency.  Per his wife he has some type of compression stockings but is not wearing them  Assessment & Plan: Visit Diagnoses: No diagnosis found.  Plan: We will place a new dressing and compression wrap the right side today.  Emphasis the importance of refraining from weightbearing and elevating his foot.  Should follow-up next week.  At some point I think we should begin to wrap both legs.  Once this has resolved some of the swelling he could transition to a compression sock  Follow-Up Instructions: No follow-ups on file.   Ortho Exam  Patient is alert, oriented, no adenopathy, well-dressed, normal affect, normal respiratory effort. Great wound well approximated wound edges he does have a callus incorporated into the suture line.  Some minimal bloody drainage no foul odor some mild skin maceration under the toe.  No ascending cellulitis no purulent drainage.  He does have significant swelling in his foot ankle and leg.  Similar findings in the left but not to the extent.  Per his family member he has not been elevating his leg.  No ascending cellulitis or signs of infection no ulcers he does have some skin discoloration.  Imaging: No results found. No images are attached to the encounter.  Labs: Lab Results  Component Value Date   HGBA1C 6.8 (H) 03/28/2020     No results found for: ALBUMIN, PREALBUMIN,  LABURIC  Lab Results  Component Value Date   MG 2.6 (H) 04/04/2020   MG 2.6 (H) 04/04/2020   MG 2.9 (H) 04/03/2020   No results found for: VD25OH  No results found for: PREALBUMIN CBC EXTENDED Latest Ref Rng & Units 04/06/2020 04/05/2020 04/04/2020  WBC 4.0 - 10.5 K/uL 11.1(H) 10.7(H) 12.6(H)  RBC 4.22 - 5.81 MIL/uL 2.75(L) 2.53(L) 2.68(L)  HGB 13.0 - 17.0 g/dL 8.3(L) 8.1(L) 8.3(L)  HCT 39.0 - 52.0 % 25.1(L) 23.4(L) 24.8(L)  PLT 150 - 400 K/uL 147(L) 124(L) 137(L)     There is no height or weight on file to calculate BMI.  Orders:  No orders of the defined types were placed in this encounter.  No orders of the defined types were placed in this encounter.    Procedures: No procedures performed  Clinical Data: No additional findings.  ROS:  All other systems negative, except as noted in the HPI. Review of Systems  Objective: Vital Signs: There were no vitals taken for this visit.  Specialty Comments:  No specialty comments available.  PMFS History: Patient Active Problem List   Diagnosis Date Noted  . Gangrene of toe of right foot (HCC)   . 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 Coronary artery disease involving native coronary artery of native heart with unstable angina pectoris (  HCC)   . Acute congestive heart failure (HCC) 03/27/2020  . Diabetic foot ulcers (HCC) 03/27/2020  . DM2 (diabetes mellitus, type 2) (HCC) 03/27/2020  . HTN (hypertension) 03/27/2020   Past Medical History:  Diagnosis Date  . Carotid artery disease (HCC)    04/01/20: 60-79% RICA stenosis, 1-39% LICA stenosis  . CHF (congestive heart failure) (HCC)   . Coronary artery disease   . Diabetes mellitus without complication (HCC)   . Hypertension   . Ischemic cardiomyopathy   . PAD (peripheral artery disease) (HCC)    occluded right axillary artery 03/29/20     No family history on file.  Past Surgical History:  Procedure Laterality Date  . AMPUTATION Right 04/24/2020   Procedure: RIGHT 2ND AND 3RD  TOE AMPUTATION;  Surgeon: Nadara Mustard, MD;  Location: [facility] OR;  Service: Orthopedics;  Laterality: Right;  . CENTRAL VENOUS CATHETER INSERTION Left 04/03/2020   Procedure: INSERTION OF FEMORAL ARTERIAL LINE;  Surgeon: Linden Dolin, MD;  [facility];  Service: Open Heart Surgery;  Laterality: Left;  . CORONARY ARTERY BYPASS GRAFT N/A 04/03/2020   Procedure: CORONARY ARTERY BYPASS GRAFTING (CABG) TIMES FOUR USING LEFT INTERNAL MAMMARY ARTERY AND LEFT RADIAL ARTERY.;  Surgeon: Linden Dolin, MD;  Location[facility];  Service: Open Heart Surgery;  Laterality: N/A;  . CORONARY PRESSURE WIRE/FFR WITH 3D MAPPING N/A 03/29/2020   Procedure: Coronary Pressure Wire/FFR w/3D Mapping;  Surgeon: Lyn Records, MD;  [facility] INVASIVE CV LAB;  Service: Cardiovascular;  Laterality: N/A;  . LEFT HEART CATH AND CORONARY ANGIOGRAPHY N/A 03/29/2020   Procedure: LEFT HEART CATH AND CORONARY ANGIOGRAPHY;  Surgeon: Lyn Records, MD;  [facility] INV[facility] LAB;  Service: Cardiovascular;  Laterality: N/A;  . RADIAL ARTERY HARVEST Left 04/03/2020   Procedure: LEFT RADIAL ARTERY HARVEST;  Surgeon: Linden Dolin, MD;  [facility] OR;  Service: Open Heart Surgery;  Laterality: Left;  . TEE WITHOUT CARDIOVERSION N/A 04/03/2020   Procedure: TRANSESOPHAGEAL ECHOCARDIOGRAM (TEE);  Surgeon: Linden Dolin, MD;  Location: [facility] OR;  Service: Open Heart Surgery;  Laterality: N/A;  . UPPER EXTREMITY ANGIOGRAPHY  03/29/2020   Procedure: Upper Extremity Angiography;  Surgeon: Lyn Records, MD;  Location: [facility] INVASIVE CV LAB;  Service: Cardiovascular;;  rt.  arm   Social History   Occupational History  . Not on file  Tobacco Use  . Smoking status: Current Every Day Smoker    Packs/day: 0.50    Types: Cigarettes  . Smokeless tobacco: Never Used  Substance and Sexual Activity  . Alcohol use: Not Currently  . Drug use: Not Currently    Frequency: 3.0 times per week    Types: Marijuana  . Sexual activity: Not  Currently

## 2020-05-03 ENCOUNTER — Other Ambulatory Visit: Payer: Self-pay

## 2020-05-03 ENCOUNTER — Other Ambulatory Visit: Payer: Self-pay | Admitting: Cardiothoracic Surgery

## 2020-05-03 ENCOUNTER — Ambulatory Visit (HOSPITAL_COMMUNITY)
Admission: RE | Admit: 2020-05-03 | Discharge: 2020-05-03 | Disposition: A | Discharge status: Home / Self Care | Payer: Self-pay | Source: Ambulatory Visit | Attending: Cardiothoracic Surgery | Admitting: Cardiothoracic Surgery

## 2020-05-03 DIAGNOSIS — Z951 Presence of aortocoronary bypass graft: Secondary | ICD-10-CM

## 2020-05-03 DIAGNOSIS — J90 Pleural effusion, not elsewhere classified: Secondary | ICD-10-CM

## 2020-05-03 HISTORY — PX: IR THORACENTESIS ASP PLEURAL SPACE W/IMG GUIDE: IMG5380

## 2020-05-03 MED ORDER — LIDOCAINE HCL 1 % IJ SOLN
INTRAMUSCULAR | Status: AC
  Filled 2020-05-03: qty 20

## 2020-05-03 MED ORDER — LIDOCAINE HCL (PF) 1 % IJ SOLN
INTRAMUSCULAR | Status: DC | PRN
  Administered 2020-05-03: 10 mL

## 2020-05-03 NOTE — Procedures (Signed)
PROCEDURE SUMMARY:  Successful image-guided right thoracentesis. Yielded 2.0 liters of dark red fluid. Procedure was stopped after 2.0 liters secondary to IR protocol as this is patient's first thoracentesis. Patient tolerated procedure well. No immediate complications. EBL = 0 mL.  Specimen was not sent for labs. CXR ordered.  Please see imaging section of Epic for full dictation.   Gordy Councilman Louk PA-C 05/03/2020 1:44 PM

## 2020-05-06 ENCOUNTER — Other Ambulatory Visit: Payer: Self-pay

## 2020-05-06 ENCOUNTER — Ambulatory Visit (INDEPENDENT_AMBULATORY_CARE_PROVIDER_SITE_OTHER): Payer: Self-pay | Admitting: Cardiothoracic Surgery

## 2020-05-06 ENCOUNTER — Encounter: Payer: Self-pay | Admitting: Cardiothoracic Surgery

## 2020-05-06 ENCOUNTER — Other Ambulatory Visit: Payer: Self-pay | Admitting: *Deleted

## 2020-05-06 VITALS — BP 93/70 | HR 90 | Resp 18 | Ht 71.0 in | Wt 179.0 lb

## 2020-05-06 DIAGNOSIS — J90 Pleural effusion, not elsewhere classified: Secondary | ICD-10-CM

## 2020-05-06 DIAGNOSIS — Z951 Presence of aortocoronary bypass graft: Secondary | ICD-10-CM

## 2020-05-06 MED ORDER — METFORMIN HCL 1000 MG PO TABS: 1000.0000 mg | ORAL_TABLET | Freq: Every day | ORAL | 11 refills | Status: DC

## 2020-05-06 MED ORDER — CLOPIDOGREL BISULFATE 75 MG PO TABS: 75.0000 mg | ORAL_TABLET | Freq: Every day | ORAL | 11 refills | Status: DC

## 2020-05-07 ENCOUNTER — Ambulatory Visit (INDEPENDENT_AMBULATORY_CARE_PROVIDER_SITE_OTHER): Payer: Self-pay | Admitting: Orthopedic Surgery

## 2020-05-07 ENCOUNTER — Encounter: Payer: Self-pay | Admitting: Orthopedic Surgery

## 2020-05-07 ENCOUNTER — Telehealth: Payer: Self-pay

## 2020-05-07 DIAGNOSIS — Z89421 Acquired absence of other right toe(s): Secondary | ICD-10-CM

## 2020-05-07 DIAGNOSIS — S98131A Complete traumatic amputation of one right lesser toe, initial encounter: Secondary | ICD-10-CM

## 2020-05-07 NOTE — Progress Notes (Signed)
      [address]       [place] [address]             [phone]     CARDIOTHORACIC SURGERY OFFICE NOTE  Referring Provider is Lyn Records, MD Primary Cardiologist is Tobias Alexander, MD PCP is Doran Stabler, NP   HPI:  65 year old man status post CABG represents for outpatient evaluation status post right thoracentesis.  He states that this has improved his breathing.  Since we last saw him he is also undergone digital amputation of the left foot.  He has no other complaints at this time.  Current Outpatient Medications  Medication Sig Dispense Refill  . aspirin 81 MG tablet Take 1 tablet (81 mg total) by mouth daily.    Marland Kitchen atorvastatin (LIPITOR) 80 MG tablet Take 1 tablet (80 mg total) by mouth daily. 30 tablet 1  . clopidogrel (PLAVIX) 75 MG tablet Take 1 tablet (75 mg total) by mouth daily. 30 tablet 11  . hydrocerin (EUCERIN) CREA Apply 113 application topically daily. 113 g 0  . metFORMIN (GLUCOPHAGE) 1000 MG tablet Take 1,000 mg by mouth daily with breakfast.    . metFORMIN (GLUCOPHAGE) 1000 MG tablet Take 1 tablet (1,000 mg total) by mouth daily with breakfast. 30 tablet 11  . metoprolol tartrate (LOPRESSOR) 25 MG tablet Take 0.5 tablets (12.5 mg total) by mouth 2 (two) times daily. 30 tablet 1  . oxyCODONE-acetaminophen (PERCOCET) 5-325 MG tablet Take 1 tablet by mouth every 4 (four) hours as needed. 30 tablet 0   No current facility-administered medications for this visit.      Physical Exam:   BP 93/70   Pulse 90   Resp 18   Ht 5\' 11"  (1.803 m)   Wt 81.2 kg   SpO2 98% Comment: on RA  BMI 24.97 kg/m   General:  Well-appearing no acute distress  Chest:   Clear to auscultation except at left base  CV:   Regular rate and rhythm  Incisions:  Healing well  Abdomen:  Soft nontender  Extremities:  Status post digit amputation in the lower extremity  Diagnostic Tests:  I have reviewed his chest x-ray from 05/03/20 status post right  thoracentesis and agree with its interpretation   Impression:  Doing well after CABG  Plan:  Left sided thoracentesis in the next few days Restart Plavix after thoracentesis Follow-up with thoracic surgery as needed--will review chest x-ray remotely after thoracentesis  I spent in excess of 15 minutes during the conduct of this office consultation and >50% of this time involved direct face-to-face encounter with the patient for counseling and/or coordination of their care.  Level 2                 10 minutes Level 3                 15 minutes Level 4                 25 minutes Level 5                 40 minutes  B.  07/03/20, MD 05/07/2020 10:07 AM

## 2020-05-07 NOTE — Telephone Encounter (Signed)
Patient contacted the office requesting refills of medications, Atorvastatin.  Patient advised to contact Cardiology office, Dr. Delton See for refills.  He acknowledged receipt.

## 2020-05-07 NOTE — Progress Notes (Addendum)
Office Visit Note   Patient: Curtis Watkins           Date of Birth: 1955-09-25           MRN: [id number] Visit Date: 05/07/2020              Requested by: Doran Stabler, NP [phone] [address] [place],  [place] [address] PCP: Doran Stabler, NP  Chief Complaint  Patient presents with  . Right Foot - Routine Post Op    04/24/20 right 2nd and 3rd toe amputation       HPI: Patient is a 65 year old gentleman who presents 2 weeks status post right foot second and third toe amputation has been in a compression wrap to decrease swelling he has been nonweightbearing in a wheelchair.  He states he feels well has no concerns.  Assessment & Plan: Visit Diagnoses:  1. Amputated toe of right foot (HCC)     Plan: We will place him in a knee-high compression sock he will wear this around-the-clock change daily wash with soap and water.  Continue strict nonweightbearing.  Follow-Up Instructions: Return in about 1 week (around 05/14/2020).   Ortho Exam  Patient is alert, oriented, no adenopathy, well-dressed, normal affect, normal respiratory effort. Examination there is decreased swelling in the right lower extremity there is good wrinkling of the skin of the ankle.  The wound edges are well approximated without epithelialization.  We will have him start with the compression wrap Dial soap cleansing daily nonweightbearing follow-up in a week.  Calf is 36 cm in circumference.  Imaging: No results found. No images are attached to the encounter.  Labs: Lab Results  Component Value Date   HGBA1C 6.8 (H) 03/28/2020     No results found for: ALBUMIN, PREALBUMIN, LABURIC  Lab Results  Component Value Date   MG 2.6 (H) 04/04/2020   MG 2.6 (H) 04/04/2020   MG 2.9 (H) 04/03/2020   No results found for: VD25OH  No results found for: PREALBUMIN CBC EXTENDED Latest Ref Rng & Units 04/06/2020 04/05/2020 04/04/2020  WBC 4.0 - 10.5 K/uL 11.1(H) 10.7(H) 12.6(H)  RBC 4.22 - 5.81  MIL/uL 2.75(L) 2.53(L) 2.68(L)  HGB 13.0 - 17.0 g/dL 8.3(L) 8.1(L) 8.3(L)  HCT 39.0 - 52.0 % 25.1(L) 23.4(L) 24.8(L)  PLT 150 - 400 K/uL 147(L) 124(L) 137(L)     There is no height or weight on file to calculate BMI.  Orders:  No orders of the defined types were placed in this encounter.  No orders of the defined types were placed in this encounter.    Procedures: No procedures performed  Clinical Data: No additional findings.  ROS:  All other systems negative, except as noted in the HPI. Review of Systems  Objective: Vital Signs: There were no vitals taken for this visit.  Specialty Comments:  No specialty comments available.  PMFS History: Patient Active Problem List   Diagnosis Date Noted  . Gangrene of toe of right foot (HCC)   . 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 Coronary artery disease involving native coronary artery of native heart with unstable angina pectoris (HCC)   . Acute congestive heart failure (HCC) 03/27/2020  . Diabetic foot ulcers (HCC) 03/27/2020  . DM2 (diabetes mellitus, type 2) (HCC) 03/27/2020  . HTN (hypertension) 03/27/2020   Past Medical History:  Diagnosis Date  . Carotid artery disease (HCC)    04/01/20: 60-79% RICA stenosis, 1-39% LICA stenosis  . CHF (congestive heart failure) (HCC)   .  Coronary artery disease   . Diabetes mellitus without complication (HCC)   . Hypertension   . Ischemic cardiomyopathy   . PAD (peripheral artery disease) (HCC)    occluded right axillary artery 03/29/20     History reviewed. No pertinent family history.  Past Surgical History:  Procedure Laterality Date  . AMPUTATION Right 04/24/2020   Procedure: RIGHT 2ND AND 3RD TOE AMPUTATION;  Surgeon: Nadara Mustard, MD;  Location: [facility] OR;  Service: Orthopedics;  Laterality: Right;  . CENTRAL VENOUS CATHETER INSERTION Left 04/03/2020   Procedure: INSERTION OF FEMORAL ARTERIAL LINE;  Surgeon: Linden Dolin, MD;  Location: [facility] OR;  Service: Open Heart Surgery;   Laterality: Left;  . CORONARY ARTERY BYPASS GRAFT N/A 04/03/2020   Procedure: CORONARY ARTERY BYPASS GRAFTING (CABG) TIMES FOUR USING LEFT INTERNAL MAMMARY ARTERY AND LEFT RADIAL ARTERY.;  Surgeon: Linden Dolin, MD;  Location: [facility] OR;  Service: Open Heart Surgery;  Laterality: N/A;  . CORONARY PRESSURE WIRE/FFR WITH 3D MAPPING N/A 03/29/2020   Procedure: Coronary Pressure Wire/FFR w/3D Mapping;  Surgeon: Lyn Records, MD;  Location: [facility] INVASIVE CV LAB;  Service: Cardiovascular;  Laterality: N/A;  . IR THORACENTESIS ASP PLEURAL SPACE W/IMG GUIDE  05/03/2020  . LEFT HEART CATH AND CORONARY ANGIOGRAPHY N/A 03/29/2020   Procedure: LEFT HEART CATH AND CORONARY ANGIOGRAPHY;  Surgeon: Lyn Records, MD;  Location: [facility] INVASIVE CV LAB;  Service: Cardiovascular;  Laterality: N/A;  . RADIAL ARTERY HARVEST Left 04/03/2020   Procedure: LEFT RADIAL ARTERY HARVEST;  Surgeon: Linden Dolin, MD;  Location: [facility] OR;  Service: Open Heart Surgery;  Laterality: Left;  . TEE WITHOUT CARDIOVERSION N/A 04/03/2020   Procedure: TRANSESOPHAGEAL ECHOCARDIOGRAM (TEE);  Surgeon: Linden Dolin, MD;  Location: [facility] OR;  Service: Open Heart Surgery;  Laterality: N/A;  . UPPER EXTREMITY ANGIOGRAPHY  03/29/2020   Procedure: Upper Extremity Angiography;  Surgeon: Lyn Records, MD;  Location: [facility] INVASIVE CV LAB;  Service: Cardiovascular;;  rt.  arm   Social History   Occupational History  . Not on file  Tobacco Use  . Smoking status: Current Every Day Smoker    Packs/day: 0.50    Types: Cigarettes  . Smokeless tobacco: Never Used  Substance and Sexual Activity  . Alcohol use: Not Currently  . Drug use: Not Currently    Frequency: 3.0 times per week    Types: Marijuana  . Sexual activity: Not Currently

## 2020-05-08 ENCOUNTER — Telehealth: Payer: Self-pay | Admitting: Cardiology

## 2020-05-08 MED ORDER — CLOPIDOGREL BISULFATE 75 MG PO TABS: 75.0000 mg | ORAL_TABLET | Freq: Every day | ORAL | 3 refills | Status: DC

## 2020-05-08 MED ORDER — METOPROLOL TARTRATE 25 MG PO TABS: 12.5000 mg | ORAL_TABLET | Freq: Two times a day (BID) | ORAL | 3 refills | Status: DC

## 2020-05-08 MED ORDER — ATORVASTATIN CALCIUM 80 MG PO TABS: 80.0000 mg | ORAL_TABLET | Freq: Every day | ORAL | 3 refills | Status: DC

## 2020-05-08 NOTE — Telephone Encounter (Signed)
*  STAT* If patient is at the pharmacy, call can be transferred to refill team.   1. Which medications need to be refilled? (please list name of each medication and dose if known) atorvastatin (LIPITOR) 80 MG tablet clopidogrel (PLAVIX) 75 MG tablet metoprolol tartrate (LOPRESSOR) 25 MG tablet  2. Which pharmacy/location (including street and city if local pharmacy) is medication to be sent to? [id number] - [place], [place] - [address]  3. Do they need a 30 day or 90 day supply? 90 day supply

## 2020-05-08 NOTE — Telephone Encounter (Signed)
Pt's medications were sent to pt's pharmacy as requested. Confirmation received.  

## 2020-05-13 ENCOUNTER — Ambulatory Visit (HOSPITAL_COMMUNITY)
Admission: RE | Admit: 2020-05-13 | Discharge: 2020-05-13 | Disposition: A | Discharge status: Home / Self Care | Payer: Self-pay | Source: Ambulatory Visit | Attending: Cardiothoracic Surgery | Admitting: Cardiothoracic Surgery

## 2020-05-13 DIAGNOSIS — Z20822 Contact with and (suspected) exposure to covid-19: Secondary | ICD-10-CM | POA: Insufficient documentation

## 2020-05-13 DIAGNOSIS — Z01812 Encounter for preprocedural laboratory examination: Secondary | ICD-10-CM | POA: Insufficient documentation

## 2020-05-13 LAB — SARS CORONAVIRUS 2 (TAT 6-24 HRS): SARS Coronavirus 2: NEGATIVE

## 2020-05-14 ENCOUNTER — Ambulatory Visit (INDEPENDENT_AMBULATORY_CARE_PROVIDER_SITE_OTHER): Payer: Self-pay | Admitting: Physician Assistant

## 2020-05-14 ENCOUNTER — Encounter: Payer: Self-pay | Admitting: Orthopedic Surgery

## 2020-05-14 DIAGNOSIS — L97511 Non-pressure chronic ulcer of other part of right foot limited to breakdown of skin: Secondary | ICD-10-CM

## 2020-05-14 NOTE — Progress Notes (Addendum)
Office Visit Note   Patient: Curtis Watkins           Date of Birth: Jul 23, 1955           MRN: [id number] Visit Date: 05/14/2020              Requested by: Doran Stabler, NP [phone] [address] [place],  [place] [address] PCP: Doran Stabler, NP  Chief Complaint  Patient presents with  . Right Foot - Routine Post Op    04/24/20 right 2nd and 3rd toe amputation       HPI: Patient is 3 weeks status post right second and third toe amputation.  He is overall doing well.  He is anxious to begin weightbearing again.  Assessment & Plan: Visit Diagnoses: No diagnosis found.  Plan: Patient will follow up in a week   Follow-Up Instructions: No follow-ups on file.   Ortho Exam  Patient is alert, oriented, no adenopathy, well-dressed, normal affect, normal respiratory effort. Right foot he is wearing a compression sock.  He does have chronic venous insufficiency.  Incision has healed with crusting blood over the top sutures were removed well apposed wound edges no necrosis he does have a very thickened callus that also had sutures in it this ever had slight dehiscence but does not appear deep the stitches were removed as well.  No surrounding cellulitis no ascending cellulitis  Imaging: No results found. No images are attached to the encounter.  Labs: Lab Results  Component Value Date   HGBA1C 6.8 (H) 03/28/2020     No results found for: ALBUMIN, PREALBUMIN, CBC  Lab Results  Component Value Date   MG 2.6 (H) 04/04/2020   MG 2.6 (H) 04/04/2020   MG 2.9 (H) 04/03/2020   No results found for: VD25OH  No results found for: PREALBUMIN CBC EXTENDED Latest Ref Rng & Units 04/06/2020 04/05/2020 04/04/2020  WBC 4.0 - 10.5 K/uL 11.1(H) 10.7(H) 12.6(H)  RBC 4.22 - 5.81 MIL/uL 2.75(L) 2.53(L) 2.68(L)  HGB 13.0 - 17.0 g/dL 8.3(L) 8.1(L) 8.3(L)  HCT 39.0 - 52.0 % 25.1(L) 23.4(L) 24.8(L)  PLT 150 - 400 K/uL 147(L) 124(L) 137(L)     There is no height or weight on file to  calculate BMI.  Orders:  No orders of the defined types were placed in this encounter.  No orders of the defined types were placed in this encounter.    Procedures: No procedures performed  Clinical Data: No additional findings.  ROS:  All other systems negative, except as noted in the HPI. Review of Systems  Objective: Vital Signs: There were no vitals taken for this visit.  Specialty Comments:  No specialty comments available.  PMFS History: Patient Active Problem List   Diagnosis Date Noted  . Gangrene of toe of right foot (HCC)   . 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 Coronary artery disease involving native coronary artery of native heart with unstable angina pectoris (HCC)   . Acute congestive heart failure (HCC) 03/27/2020  . Diabetic foot ulcers (HCC) 03/27/2020  . DM2 (diabetes mellitus, type 2) (HCC) 03/27/2020  . HTN (hypertension) 03/27/2020   Past Medical History:  Diagnosis Date  . Carotid artery disease (HCC)    04/01/20: 60-79% RICA stenosis, 1-39% LICA stenosis  . CHF (congestive heart failure) (HCC)   . Coronary artery disease   . Diabetes mellitus without complication (HCC)   . Hypertension   . Ischemic cardiomyopathy   . PAD (peripheral artery disease) (  HCC)    occluded right axillary artery 03/29/20     No family history on file.  Past Surgical History:  Procedure Laterality Date  . AMPUTATION Right 04/24/2020   Procedure: RIGHT 2ND AND 3RD TOE AMPUTATION;  Surgeon: Nadara Mustard, MD;  Location: [facility] OR;  Service: Orthopedics;  Laterality: Right;  . CENTRAL VENOUS CATHETER INSERTION Left 04/03/2020   Procedure: INSERTION OF FEMORAL ARTERIAL LINE;  Surgeon: Linden Dolin, MD;  Location: [facility] OR;  Service: Open Heart Surgery;  Laterality: Left;  . CORONARY ARTERY BYPASS GRAFT N/A 04/03/2020   Procedure: CORONARY ARTERY BYPASS GRAFTING (CABG) TIMES FOUR USING LEFT INTERNAL MAMMARY ARTERY AND LEFT RADIAL ARTERY.;  Surgeon: Linden Dolin, MD;  Location: [facility] OR;   Service: Open Heart Surgery;  Laterality: N/A;  . CORONARY PRESSURE WIRE/FFR WITH 3D MAPPING N/A 03/29/2020   Procedure: Coronary Pressure Wire/FFR w/3D Mapping;  Surgeon: Lyn Records, MD;  Location: [facility] INVASIVE CV LAB;  Service: Cardiovascular;  Laterality: N/A;  . IR THORACENTESIS ASP PLEURAL SPACE W/IMG GUIDE  05/03/2020  . LEFT HEART CATH AND CORONARY ANGIOGRAPHY N/A 03/29/2020   Procedure: LEFT HEART CATH AND CORONARY ANGIOGRAPHY;  Surgeon: Lyn Records, MD;  Location: [facility]VAS[facility] CV LAB;  Service: Cardiovascular;  Laterality: N/A;  . RADIAL ARTERY HARVEST Left 04/03/2020   Procedure: LEFT RADIAL ARTERY HARVEST;  Surgeon: Linden Dolin, MD;  Location: [facility] OR;  Service: Open Heart Surgery;  Laterality: Left;  . TEE WITHOUT CARDIOVERSION N/A 04/03/2020   Procedure: TRANSESOPHAGEAL ECHOCARDIOGRAM (TEE);  Surgeon: Linden Dolin, MD;  Location: [facility] OR;  Service: Open Heart Surgery;  Laterality: N/A;  . UPPER EXTREMITY ANGIOGRAPHY  03/29/2020   Procedure: Upper Extremity Angiography;  Surgeon: Lyn Records, MD;  Location: [facility] INVASIVE CV LAB;  Service: Cardiovascular;;  rt.  arm   Social History   Occupational History  . Not on file  Tobacco Use  . Smoking status: Current Every Day Smoker    Packs/day: 0.50    Types: Cigarettes  . Smokeless tobacco: Never Used  Substance and Sexual Activity  . Alcohol use: Not Currently  . Drug use: Not Currently    Frequency: 3.0 times per week    Types: Marijuana  . Sexual activity: Not Currently

## 2020-05-15 NOTE — Addendum Note (Signed)
Addended by: Alyson Ingles on: 05/15/2020 04:07 PM   Modules accepted: Orders

## 2020-05-16 ENCOUNTER — Ambulatory Visit (HOSPITAL_COMMUNITY)
Admission: RE | Admit: 2020-05-16 | Discharge: 2020-05-16 | Disposition: A | Discharge status: Home / Self Care | Payer: Self-pay | Source: Ambulatory Visit | Attending: Radiology | Admitting: Radiology

## 2020-05-16 ENCOUNTER — Ambulatory Visit (HOSPITAL_COMMUNITY)
Admission: RE | Admit: 2020-05-16 | Discharge: 2020-05-16 | Disposition: A | Discharge status: Home / Self Care | Payer: Self-pay | Source: Ambulatory Visit | Attending: Cardiothoracic Surgery | Admitting: Cardiothoracic Surgery

## 2020-05-16 ENCOUNTER — Other Ambulatory Visit: Payer: Self-pay

## 2020-05-16 ENCOUNTER — Other Ambulatory Visit (HOSPITAL_COMMUNITY): Payer: Self-pay | Admitting: Radiology

## 2020-05-16 DIAGNOSIS — J90 Pleural effusion, not elsewhere classified: Secondary | ICD-10-CM

## 2020-05-16 DIAGNOSIS — I700 Atherosclerosis of aorta: Secondary | ICD-10-CM | POA: Insufficient documentation

## 2020-05-16 HISTORY — PX: IR THORACENTESIS ASP PLEURAL SPACE W/IMG GUIDE: IMG5380

## 2020-05-16 MED ORDER — LIDOCAINE HCL 1 % IJ SOLN
INTRAMUSCULAR | Status: AC | PRN
Start: 2020-05-16 — End: 2020-05-16
  Administered 2020-05-16: 10 mL

## 2020-05-16 MED ORDER — LIDOCAINE HCL 1 % IJ SOLN
INTRAMUSCULAR | Status: AC
  Filled 2020-05-16: qty 20

## 2020-05-16 NOTE — Procedures (Signed)
PROCEDURE SUMMARY:  Successful US guided left thoracentesis. Yielded 1650 mL of serosanguinous fluid. Pt tolerated procedure well. No immediate complications.  Specimen was not sent for labs. CXR ordered.  EBL < 5 mL  Brayton El PA-C 05/16/2020 2:20 PM

## 2020-05-21 ENCOUNTER — Ambulatory Visit: Payer: Self-pay | Admitting: Physician Assistant

## 2020-05-21 ENCOUNTER — Ambulatory Visit (INDEPENDENT_AMBULATORY_CARE_PROVIDER_SITE_OTHER): Payer: Self-pay | Admitting: Orthopedic Surgery

## 2020-05-21 DIAGNOSIS — S98131A Complete traumatic amputation of one right lesser toe, initial encounter: Secondary | ICD-10-CM

## 2020-05-21 DIAGNOSIS — L97511 Non-pressure chronic ulcer of other part of right foot limited to breakdown of skin: Secondary | ICD-10-CM

## 2020-05-21 DIAGNOSIS — Z89421 Acquired absence of other right toe(s): Secondary | ICD-10-CM

## 2020-05-23 ENCOUNTER — Encounter: Payer: Self-pay | Admitting: Orthopedic Surgery

## 2020-05-23 NOTE — Progress Notes (Signed)
Office Visit Note   Patient: Curtis Watkins           Date of Birth: [birth year]           MRN: [id number] Visit Date: 05/21/2020              Requested by: Doran Stabler, NP [phone] [address]. [address] [place],  [place] [address] PCP: Doran Stabler, NP  Chief Complaint  Patient presents with  . Right Foot - Follow-up    2nd/ 3rd toe amputation      HPI: Patient is a 65 year old gentleman who presents status post third and second ray amputations of the right foot which are well-healed he complains of a large ulcer on the plantar aspect of the right foot.  Assessment & Plan: Visit Diagnoses:  1. Non-pressure chronic ulcer of other part of right foot limited to breakdown of skin (HCC)   2. Amputated toe of right foot (HCC)     Plan: Ulcer was debrided of skin and soft tissue back to healthy viable granulation tissue there is no exposed bone or tendon.  We will continue with his protective shoe wear follow-up in 4 weeks.  Follow-Up Instructions: Return in about 4 weeks (around 06/18/2020).   Ortho Exam  Patient is alert, oriented, no adenopathy, well-dressed, normal affect, normal respiratory effort. Examination of the right foot there is no redness or cellulitis no drainage.  Patient has a large ulcer on the plantar aspect of the right foot that is 2 cm in diameter.  After informed consent a 10 blade knife was used to debride the skin and soft tissue back to healthy viable granulation tissue after debridement the ulcer is 4 cm in diameter and 4 mm deep.  Silver nitrate was used for hemostasis Iodosorb and a Band-Aid was applied.  There is no exposed bone or tendon no abscess.  Imaging: No results found. No images are attached to the encounter.  Labs: Lab Results  Component Value Date   HGBA1C 6.8 (H) 03/28/2020     No results found for: ALBUMIN, PREALBUMIN, CBC  Lab Results  Component Value Date   MG 2.6 (H) 04/04/2020   MG 2.6 (H) 04/04/2020   MG 2.9 (H)  04/03/2020   No results found for: VD25OH  No results found for: PREALBUMIN CBC EXTENDED Latest Ref Rng & Units 04/06/2020 04/05/2020 04/04/2020  WBC 4.0 - 10.5 K/uL 11.1(H) 10.7(H) 12.6(H)  RBC 4.22 - 5.81 MIL/uL 2.75(L) 2.53(L) 2.68(L)  HGB 13.0 - 17.0 g/dL 8.3(L) 8.1(L) 8.3(L)  HCT 39.0 - 52.0 % 25.1(L) 23.4(L) 24.8(L)  PLT 150 - 400 K/uL 147(L) 124(L) 137(L)     There is no height or weight on file to calculate BMI.  Orders:  No orders of the defined types were placed in this encounter.  No orders of the defined types were placed in this encounter.    Procedures: No procedures performed  Clinical Data: No additional findings.  ROS:  All other systems negative, except as noted in the HPI. Review of Systems  Objective: Vital Signs: There were no vitals taken for this visit.  Specialty Comments:  No specialty comments available.  PMFS History: Patient Active Problem List   Diagnosis Date Noted  . Gangrene of toe of right foot (HCC)   . 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 Coronary artery disease involving native coronary artery of native heart with unstable angina pectoris (HCC)   . Acute congestive heart failure (HCC) 03/27/2020  .  Diabetic foot ulcers (HCC) 03/27/2020  . DM2 (diabetes mellitus, type 2) (HCC) 03/27/2020  . HTN (hypertension) 03/27/2020   Past Medical History:  Diagnosis Date  . Carotid artery disease (HCC)    04/01/20: 60-79% RICA stenosis, 1-39% LICA stenosis  . CHF (congestive heart failure) (HCC)   . Coronary artery disease   . Diabetes mellitus without complication (HCC)   . Hypertension   . Ischemic cardiomyopathy   . PAD (peripheral artery disease) (HCC)    occluded right axillary artery 03/29/20     History reviewed. No pertinent family history.  Past Surgical History:  Procedure Laterality Date  . AMPUTATION Right 04/24/2020   Procedure: RIGHT 2ND AND 3RD TOE AMPUTATION;  Surgeon: Nadara Mustard, MD;  Location: [facility] OR;  Service: Orthopedics;   Laterality: Right;  . CENTRAL VENOUS CATHETER INSERTION Left 04/03/2020   Procedure: INSERTION OF FEMORAL ARTERIAL LINE;  Surgeon: Linden Dolin, MD;  Location: [facility] OR;  Service: Open Heart Surgery;  Laterality: Left;  . CORONARY ARTERY BYPASS GRAFT N/A 04/03/2020   Procedure: CORONARY ARTERY BYPASS GRAFTING (CABG) TIMES FOUR USING LEFT INTERNAL MAMMARY ARTERY AND LEFT RADIAL ARTERY.;  Surgeon: Linden Dolin, MD;  Location: [facility] OR;  Service: Open Heart Surgery;  Laterality: N/A;  . CORONARY PRESSURE WIRE/FFR WITH 3D MAPPING N/A 03/29/2020   Procedure: Coronary Pressure Wire/FFR w/3D Mapping;  Surgeon: Lyn Records, MD;  Location: [facility] INVASIVE CV LAB;  Service: Cardiovascular;  Laterality: N/A;  . IR THORACENTESIS ASP PLEURAL SPACE W/IMG GUIDE  05/03/2020  . IR THORACENTESIS ASP PLEURAL SPACE W/IMG GUIDE  05/16/2020  . LEFT HEART CATH AND CORONARY ANGIOGRAPHY N/A 03/29/2020   Procedure: LEFT HEART CATH AND CORONARY ANGIOGRAPHY;  Surgeon: Lyn Records, MD;  Location: [facility] INVASIVE CV LAB;  Service: Cardiovascular;  Laterality: N/A;  . RADIAL ARTERY HARVEST Left 04/03/2020   Procedure: LEFT RADIAL ARTERY HARVEST;  Surgeon: Linden Dolin, MD;  Location: [facility] OR;  Service: Open Heart Surgery;  Laterality: Left;  . TEE WITHOUT CARDIOVERSION N/A 04/03/2020   Procedure: TRANSESOPHAGEAL ECHOCARDIOGRAM (TEE);  Surgeon: Linden Dolin, MD;  Location: [facility] OR;  Service: Open Heart Surgery;  Laterality: N/A;  . UPPER EXTREMITY ANGIOGRAPHY  03/29/2020   Procedure: Upper Extremity Angiography;  Surgeon: Lyn Records, MD;  Location: [facility] INVASIVE CV LAB;  Service: Cardiovascular;;  rt.  arm   Social History   Occupational History  . Not on file  Tobacco Use  . Smoking status: Current Every Day Smoker    Packs/day: 0.50    Types: Cigarettes  . Smokeless tobacco: Never Used  Substance and Sexual Activity  . Alcohol use: Not Currently  . Drug use: Not Currently    Frequency: 3.0 times per week    Types:  Marijuana  . Sexual activity: Not Currently

## 2020-05-24 ENCOUNTER — Other Ambulatory Visit: Payer: Self-pay

## 2020-05-24 ENCOUNTER — Inpatient Hospital Stay (HOSPITAL_COMMUNITY)
Admission: EM | Admit: 2020-05-24 | Discharge: 2020-05-28 | DRG: 291 | Disposition: A | Discharge status: Home / Self Care | Payer: Medicaid Other | Attending: Internal Medicine | Admitting: Internal Medicine

## 2020-05-24 ENCOUNTER — Emergency Department (HOSPITAL_COMMUNITY): Payer: Medicaid Other

## 2020-05-24 DIAGNOSIS — E119 Type 2 diabetes mellitus without complications: Secondary | ICD-10-CM

## 2020-05-24 DIAGNOSIS — L97509 Non-pressure chronic ulcer of other part of unspecified foot with unspecified severity: Secondary | ICD-10-CM | POA: Diagnosis present

## 2020-05-24 DIAGNOSIS — E785 Hyperlipidemia, unspecified: Secondary | ICD-10-CM | POA: Diagnosis present

## 2020-05-24 DIAGNOSIS — E1151 Type 2 diabetes mellitus with diabetic peripheral angiopathy without gangrene: Secondary | ICD-10-CM | POA: Diagnosis present

## 2020-05-24 DIAGNOSIS — I2510 Atherosclerotic heart disease of native coronary artery without angina pectoris: Secondary | ICD-10-CM | POA: Diagnosis present

## 2020-05-24 DIAGNOSIS — Z79899 Other long term (current) drug therapy: Secondary | ICD-10-CM

## 2020-05-24 DIAGNOSIS — Z7982 Long term (current) use of aspirin: Secondary | ICD-10-CM

## 2020-05-24 DIAGNOSIS — I130 Hypertensive heart and chronic kidney disease with heart failure and stage 1 through stage 4 chronic kidney disease, or unspecified chronic kidney disease: Principal | ICD-10-CM | POA: Diagnosis present

## 2020-05-24 DIAGNOSIS — Z20822 Contact with and (suspected) exposure to covid-19: Secondary | ICD-10-CM | POA: Diagnosis present

## 2020-05-24 DIAGNOSIS — Z89421 Acquired absence of other right toe(s): Secondary | ICD-10-CM

## 2020-05-24 DIAGNOSIS — I10 Essential (primary) hypertension: Secondary | ICD-10-CM | POA: Diagnosis present

## 2020-05-24 DIAGNOSIS — J90 Pleural effusion, not elsewhere classified: Secondary | ICD-10-CM

## 2020-05-24 DIAGNOSIS — D649 Anemia, unspecified: Secondary | ICD-10-CM | POA: Diagnosis present

## 2020-05-24 DIAGNOSIS — Z9889 Other specified postprocedural states: Secondary | ICD-10-CM

## 2020-05-24 DIAGNOSIS — I509 Heart failure, unspecified: Secondary | ICD-10-CM

## 2020-05-24 DIAGNOSIS — Z951 Presence of aortocoronary bypass graft: Secondary | ICD-10-CM

## 2020-05-24 DIAGNOSIS — Z7902 Long term (current) use of antithrombotics/antiplatelets: Secondary | ICD-10-CM

## 2020-05-24 DIAGNOSIS — Z7984 Long term (current) use of oral hypoglycemic drugs: Secondary | ICD-10-CM

## 2020-05-24 DIAGNOSIS — J918 Pleural effusion in other conditions classified elsewhere: Secondary | ICD-10-CM | POA: Diagnosis present

## 2020-05-24 DIAGNOSIS — I959 Hypotension, unspecified: Secondary | ICD-10-CM | POA: Diagnosis not present

## 2020-05-24 DIAGNOSIS — E11621 Type 2 diabetes mellitus with foot ulcer: Secondary | ICD-10-CM | POA: Diagnosis present

## 2020-05-24 DIAGNOSIS — E1122 Type 2 diabetes mellitus with diabetic chronic kidney disease: Secondary | ICD-10-CM | POA: Diagnosis present

## 2020-05-24 DIAGNOSIS — E876 Hypokalemia: Secondary | ICD-10-CM | POA: Diagnosis not present

## 2020-05-24 DIAGNOSIS — I255 Ischemic cardiomyopathy: Secondary | ICD-10-CM | POA: Diagnosis present

## 2020-05-24 DIAGNOSIS — N1831 Chronic kidney disease, stage 3a: Secondary | ICD-10-CM | POA: Diagnosis present

## 2020-05-24 DIAGNOSIS — I5043 Acute on chronic combined systolic (congestive) and diastolic (congestive) heart failure: Secondary | ICD-10-CM | POA: Diagnosis present

## 2020-05-24 DIAGNOSIS — F17210 Nicotine dependence, cigarettes, uncomplicated: Secondary | ICD-10-CM | POA: Diagnosis present

## 2020-05-24 DIAGNOSIS — N179 Acute kidney failure, unspecified: Secondary | ICD-10-CM | POA: Diagnosis not present

## 2020-05-24 LAB — BASIC METABOLIC PANEL
Anion gap: 8 (ref 5–15)
BUN: 19 mg/dL (ref 8–23)
CO2: 27 mmol/L (ref 22–32)
Calcium: 8.8 mg/dL — ABNORMAL LOW (ref 8.9–10.3)
Chloride: 103 mmol/L (ref 98–111)
Creatinine, Ser: 1.3 mg/dL — ABNORMAL HIGH (ref 0.61–1.24)
GFR, Estimated: >60 mL/min (ref >60)
Glucose, Bld: 244 mg/dL — ABNORMAL HIGH (ref 70–99)
Potassium: 4.1 mmol/L (ref 3.5–5.1)
Sodium: 138 mmol/L (ref 135–145)

## 2020-05-24 LAB — CBC
HCT: 27.8 % — ABNORMAL LOW (ref 39.0–52.0)
Hemoglobin: 8.7 g/dL — ABNORMAL LOW (ref 13.0–17.0)
MCH: 28.8 pg (ref 26.0–34.0)
MCHC: 31.3 g/dL (ref 30.0–36.0)
MCV: 92.1 fL (ref 80.0–100.0)
Platelets: 408 10*3/uL — ABNORMAL HIGH (ref 150–400)
RBC: 3.02 MIL/uL — ABNORMAL LOW (ref 4.22–5.81)
RDW: 14.6 % (ref 11.5–15.5)
WBC: 7.8 10*3/uL (ref 4.0–10.5)
nRBC: 0 % (ref 0.0–0.2)

## 2020-05-24 LAB — BRAIN NATRIURETIC PEPTIDE: B Natriuretic Peptide: 3024.2 pg/mL — ABNORMAL HIGH (ref 0.0–100.0)

## 2020-05-24 NOTE — ED Triage Notes (Signed)
Pt reports intermittent SOB for a couple days. Pt states he had bypass surgery about a month ago. Pt reports swelling in both legs after surgery. Pt denies any chest pain, N/V/D

## 2020-05-24 NOTE — ED Provider Notes (Signed)
MSE was initiated and I personally evaluated the patient and placed orders (if any) at  6:03 PM on May 24, 2020.  The patient appears stable so that the remainder of the MSE may be completed by another provider.  Patient w recent CABG, R toe amputation now p/w dyspnea.  No hypoxia, no distress.  He is feeling better now than when Sx were pronounced.   EKG Interpretation  Date/Time:  Friday May 24 2020 17:45:12 EDT Ventricular Rate:  87 PR Interval:  138 QRS Duration: 74 QT Interval:  392 QTC Calculation: 471 R Axis:   52 Text Interpretation: Normal sinus rhythm ST-t wave abnormality Prolonged QT Abnormal ECG Confirmed by Gerhard Munch [phone]) on 05/24/2020 6:05:28 PM         Gerhard Munch, MD 05/24/20 1806

## 2020-05-25 ENCOUNTER — Other Ambulatory Visit: Payer: Self-pay

## 2020-05-25 ENCOUNTER — Encounter (HOSPITAL_COMMUNITY): Payer: Self-pay | Admitting: Internal Medicine

## 2020-05-25 ENCOUNTER — Inpatient Hospital Stay (HOSPITAL_COMMUNITY): Payer: Medicaid Other

## 2020-05-25 DIAGNOSIS — Z951 Presence of aortocoronary bypass graft: Secondary | ICD-10-CM | POA: Diagnosis not present

## 2020-05-25 DIAGNOSIS — N1831 Chronic kidney disease, stage 3a: Secondary | ICD-10-CM | POA: Diagnosis present

## 2020-05-25 DIAGNOSIS — I959 Hypotension, unspecified: Secondary | ICD-10-CM | POA: Diagnosis not present

## 2020-05-25 DIAGNOSIS — E1122 Type 2 diabetes mellitus with diabetic chronic kidney disease: Secondary | ICD-10-CM | POA: Diagnosis present

## 2020-05-25 DIAGNOSIS — I130 Hypertensive heart and chronic kidney disease with heart failure and stage 1 through stage 4 chronic kidney disease, or unspecified chronic kidney disease: Secondary | ICD-10-CM | POA: Diagnosis present

## 2020-05-25 DIAGNOSIS — E119 Type 2 diabetes mellitus without complications: Secondary | ICD-10-CM

## 2020-05-25 DIAGNOSIS — I2510 Atherosclerotic heart disease of native coronary artery without angina pectoris: Secondary | ICD-10-CM | POA: Diagnosis present

## 2020-05-25 DIAGNOSIS — Z7984 Long term (current) use of oral hypoglycemic drugs: Secondary | ICD-10-CM | POA: Diagnosis not present

## 2020-05-25 DIAGNOSIS — F17210 Nicotine dependence, cigarettes, uncomplicated: Secondary | ICD-10-CM | POA: Diagnosis present

## 2020-05-25 DIAGNOSIS — Z79899 Other long term (current) drug therapy: Secondary | ICD-10-CM | POA: Diagnosis not present

## 2020-05-25 DIAGNOSIS — I5043 Acute on chronic combined systolic (congestive) and diastolic (congestive) heart failure: Secondary | ICD-10-CM | POA: Diagnosis present

## 2020-05-25 DIAGNOSIS — D649 Anemia, unspecified: Secondary | ICD-10-CM | POA: Diagnosis present

## 2020-05-25 DIAGNOSIS — Z7982 Long term (current) use of aspirin: Secondary | ICD-10-CM | POA: Diagnosis not present

## 2020-05-25 DIAGNOSIS — J90 Pleural effusion, not elsewhere classified: Secondary | ICD-10-CM

## 2020-05-25 DIAGNOSIS — J918 Pleural effusion in other conditions classified elsewhere: Secondary | ICD-10-CM | POA: Diagnosis present

## 2020-05-25 DIAGNOSIS — I5033 Acute on chronic diastolic (congestive) heart failure: Secondary | ICD-10-CM

## 2020-05-25 DIAGNOSIS — I509 Heart failure, unspecified: Secondary | ICD-10-CM

## 2020-05-25 DIAGNOSIS — E785 Hyperlipidemia, unspecified: Secondary | ICD-10-CM | POA: Diagnosis present

## 2020-05-25 DIAGNOSIS — Z89421 Acquired absence of other right toe(s): Secondary | ICD-10-CM | POA: Diagnosis not present

## 2020-05-25 DIAGNOSIS — I255 Ischemic cardiomyopathy: Secondary | ICD-10-CM | POA: Diagnosis present

## 2020-05-25 DIAGNOSIS — N179 Acute kidney failure, unspecified: Secondary | ICD-10-CM | POA: Diagnosis not present

## 2020-05-25 DIAGNOSIS — E876 Hypokalemia: Secondary | ICD-10-CM | POA: Diagnosis not present

## 2020-05-25 DIAGNOSIS — Z7902 Long term (current) use of antithrombotics/antiplatelets: Secondary | ICD-10-CM | POA: Diagnosis not present

## 2020-05-25 DIAGNOSIS — E1151 Type 2 diabetes mellitus with diabetic peripheral angiopathy without gangrene: Secondary | ICD-10-CM | POA: Diagnosis present

## 2020-05-25 DIAGNOSIS — Z20822 Contact with and (suspected) exposure to covid-19: Secondary | ICD-10-CM | POA: Diagnosis present

## 2020-05-25 LAB — SARS CORONAVIRUS 2 (TAT 6-24 HRS): SARS Coronavirus 2: NEGATIVE

## 2020-05-25 LAB — GLUCOSE, CAPILLARY
Glucose-Capillary: 207 mg/dL — ABNORMAL HIGH (ref 70–99)
Glucose-Capillary: 170 mg/dL — ABNORMAL HIGH (ref 70–99)
Glucose-Capillary: 136 mg/dL — ABNORMAL HIGH (ref 70–99)

## 2020-05-25 LAB — CBG MONITORING, ED: Glucose-Capillary: 163 mg/dL — ABNORMAL HIGH (ref 70–99)

## 2020-05-25 MED ORDER — INSULIN ASPART 100 UNIT/ML ~~LOC~~ SOLN: 0.0000 [IU] | Freq: Every day | SUBCUTANEOUS | Status: DC

## 2020-05-25 MED ORDER — SPIRONOLACTONE 25 MG PO TABS
25.0000 mg | ORAL_TABLET | Freq: Every day | ORAL | Status: DC
  Administered 2020-05-26 – 2020-05-28 (×3): 25 mg via ORAL
  Filled 2020-05-25 (×7): qty 1

## 2020-05-25 MED ORDER — ATORVASTATIN CALCIUM 80 MG PO TABS
80.0000 mg | ORAL_TABLET | Freq: Every day | ORAL | Status: DC
  Administered 2020-05-25 – 2020-05-28 (×4): 80 mg via ORAL
  Filled 2020-05-25 (×4): qty 1

## 2020-05-25 MED ORDER — FUROSEMIDE 10 MG/ML IJ SOLN
80.0000 mg | Freq: Two times a day (BID) | INTRAMUSCULAR | Status: AC
  Administered 2020-05-25 – 2020-05-26 (×2): 80 mg via INTRAVENOUS
  Filled 2020-05-25 (×2): qty 8

## 2020-05-25 MED ORDER — ONDANSETRON HCL 4 MG/2ML IJ SOLN: 4.0000 mg | Freq: Four times a day (QID) | INTRAMUSCULAR | Status: DC | PRN

## 2020-05-25 MED ORDER — LOSARTAN POTASSIUM 25 MG PO TABS
25.0000 mg | ORAL_TABLET | Freq: Every day | ORAL | Status: DC
  Filled 2020-05-25: qty 1

## 2020-05-25 MED ORDER — INSULIN ASPART 100 UNIT/ML ~~LOC~~ SOLN
0.0000 [IU] | Freq: Three times a day (TID) | SUBCUTANEOUS | Status: DC
  Administered 2020-05-25: 3 [IU] via SUBCUTANEOUS
  Administered 2020-05-25: 5 [IU] via SUBCUTANEOUS
  Administered 2020-05-25: 3 [IU] via SUBCUTANEOUS
  Administered 2020-05-26: 1 [IU] via SUBCUTANEOUS
  Administered 2020-05-26: 3 [IU] via SUBCUTANEOUS
  Administered 2020-05-26: 8 [IU] via SUBCUTANEOUS
  Administered 2020-05-27: 2 [IU] via SUBCUTANEOUS
  Administered 2020-05-27: 5 [IU] via SUBCUTANEOUS
  Administered 2020-05-27: 2 [IU] via SUBCUTANEOUS
  Administered 2020-05-28 (×2): 3 [IU] via SUBCUTANEOUS

## 2020-05-25 MED ORDER — ACETAMINOPHEN 325 MG PO TABS: 650.0000 mg | ORAL_TABLET | ORAL | Status: DC | PRN

## 2020-05-25 MED ORDER — SODIUM CHLORIDE 0.9% FLUSH: 3.0000 mL | INTRAVENOUS | Status: DC | PRN

## 2020-05-25 MED ORDER — SODIUM CHLORIDE 0.9% FLUSH
3.0000 mL | Freq: Two times a day (BID) | INTRAVENOUS | Status: DC
  Administered 2020-05-25 – 2020-05-28 (×7): 3 mL via INTRAVENOUS

## 2020-05-25 MED ORDER — CLOPIDOGREL BISULFATE 75 MG PO TABS
75.0000 mg | ORAL_TABLET | Freq: Every day | ORAL | Status: DC
  Administered 2020-05-25 – 2020-05-28 (×4): 75 mg via ORAL
  Filled 2020-05-25 (×4): qty 1

## 2020-05-25 MED ORDER — ASPIRIN EC 81 MG PO TBEC
81.0000 mg | DELAYED_RELEASE_TABLET | Freq: Every day | ORAL | Status: DC
  Administered 2020-05-25 – 2020-05-28 (×4): 81 mg via ORAL
  Filled 2020-05-25 (×4): qty 1

## 2020-05-25 MED ORDER — SPIRONOLACTONE 25 MG PO TABS: 25.0000 mg | ORAL_TABLET | Freq: Every day | ORAL | Status: DC

## 2020-05-25 MED ORDER — HEPARIN SODIUM (PORCINE) 5000 UNIT/ML IJ SOLN
5000.0000 [IU] | Freq: Three times a day (TID) | INTRAMUSCULAR | Status: DC
  Administered 2020-05-25 – 2020-05-28 (×9): 5000 [IU] via SUBCUTANEOUS
  Filled 2020-05-25 (×10): qty 1

## 2020-05-25 MED ORDER — FUROSEMIDE 10 MG/ML IJ SOLN
40.0000 mg | Freq: Once | INTRAMUSCULAR | Status: AC
  Administered 2020-05-25: 40 mg via INTRAVENOUS
  Filled 2020-05-25: qty 4

## 2020-05-25 MED ORDER — METOPROLOL SUCCINATE ER 25 MG PO TB24
12.5000 mg | ORAL_TABLET | Freq: Every day | ORAL | Status: DC
  Administered 2020-05-25 – 2020-05-28 (×4): 12.5 mg via ORAL
  Filled 2020-05-25 (×4): qty 1

## 2020-05-25 MED ORDER — EMPAGLIFLOZIN 10 MG PO TABS
10.0000 mg | ORAL_TABLET | Freq: Every day | ORAL | Status: DC
Start: 2020-05-25 — End: 2020-05-28
  Administered 2020-05-25 – 2020-05-28 (×4): 10 mg via ORAL
  Filled 2020-05-25 (×4): qty 1

## 2020-05-25 MED ORDER — SODIUM CHLORIDE 0.9 % IV SOLN: 250.0000 mL | INTRAVENOUS | Status: DC | PRN

## 2020-05-25 MED ORDER — FUROSEMIDE 10 MG/ML IJ SOLN: 20.0000 mg | Freq: Once | INTRAMUSCULAR | Status: DC

## 2020-05-25 NOTE — Progress Notes (Signed)
Patient Name: Curtis Watkins   Patient Profile:   Curtis Watkins is a 65 y.o. male with hx of CAD s/p CABG (LIMA-->LAD/diag, L radial-->OM1/OM3), HFrEF, type 2 diabetes mellitus c/b foot ulcer s/p 2nd/3rd ray amputation, PAD, and essential hypertension who presented 4/20 6 with progressive SOB and LE edema, found to be in acute on chronic systolic and diastolic heart failure.  Chest x-ray demonstrated repeat bilateral effusions Furosemide x 1 Echo pre-CABG EF 45% with discrete wall motion abnormalities; echo-TEE intraoperative-normal LV function   SUBJECTIVE: Shortness of breath better but still quite short of breath and very edematous  History of coronary artery disease with CABG as noted above 2/22 complicated by a large pleural effusion for which he underwent thoracentesis 3/22  Chronic anemia, preoperative hemoglobin was about 11 now 8     Past Medical History:  Diagnosis Date  . Carotid artery disease (HCC)    04/01/20: 60-79% RICA stenosis, 1-39% LICA stenosis  . CHF (congestive heart failure) (HCC)   . Coronary artery disease   . Diabetes mellitus without complication (HCC)   . Hypertension   . Ischemic cardiomyopathy   . PAD (peripheral artery disease) (HCC)    occluded right axillary artery 03/29/20     Scheduled Meds:  Scheduled Meds: . aspirin EC  81 mg Oral Daily  . atorvastatin  80 mg Oral Daily  . clopidogrel  75 mg Oral Daily  . empagliflozin  10 mg Oral Daily  . heparin  5,000 Units Subcutaneous Q8H  . insulin aspart  0-15 Units Subcutaneous TID WC  . insulin aspart  0-5 Units Subcutaneous QHS  . losartan  25 mg Oral Daily  . metoprolol succinate  12.5 mg Oral Daily  . sodium chloride flush  3 mL Intravenous Q12H  . spironolactone  25 mg Oral Daily   Continuous Infusions: . sodium chloride     sodium chloride, acetaminophen, ondansetron (ZOFRAN) IV, sodium chloride flush    PHYSICAL EXAM Vitals:   05/25/20 0721 05/25/20 0920 05/25/20 0936  05/25/20 1153  BP: 118/77 118/83  112/77  Pulse:  93  99  Resp: (!) 24 18  20   Temp:  97.9 F (36.6 C)  97.9 F (36.6 C)  TempSrc:  Oral  Oral  SpO2: 94% 94%  (!) 85%  Weight:   81.4 kg   Height:       Well developed and nourished in no acute distress HENT normal Neck supple with JVP- 8-10 Decreased breath sounds bilaterally halfway up Regular rate and rhythm, no murmurs or gallops Abd-soft with active BS No Clubbing cyanosis 2-3+ edema Skin-warm and dry A & Oriented  Grossly normal sensory and motor function  ECG    TELEMETRY: Reviewed personnally pt in sinus    Intake/Output Summary (Last 24 hours) at 05/25/2020 1354 Last data filed at 05/25/2020 1200 Gross per 24 hour  Intake 720 ml  Output --  Net 720 ml    LABS: Basic Metabolic Panel: Recent Labs  Lab 05/24/20 1813  NA 138  K 4.1  CL 103  CO2 27  GLUCOSE 244*  BUN 19  CREATININE 1.30*  CALCIUM 8.8*   Cardiac Enzymes: No results for input(s): CKTOTAL, CKMB, CKMBINDEX, TROPONINI in the last 72 hours. CBC: Recent Labs  Lab 05/24/20 1813  WBC 7.8  HGB 8.7*  HCT 27.8*  MCV 92.1  PLT 408*   PROTIME: No results for input(s): LABPROT, INR in the last 72 hours. Liver  Function Tests: No results for input(s): AST, ALT, ALKPHOS, BILITOT, PROT, ALBUMIN in the last 72 hours. No results for input(s): LIPASE, AMYLASE in the last 72 hours. BNP: BNP (last 3 results) Recent Labs    03/27/20 1811 05/24/20 1813  BNP 1,383.1* 3,024.2*    ProBNP (last 3 results) No results for input(s): PROBNP in the last 8760 hours.      ASSESSMENT AND PLAN:  Congestive heart failure acute/chronic/systolic/diastolic  Ischemic heart disease status post CABG  Pleural effusions-recurrent  Diabetes  Anemia  Acute on chronic congestive heart failure in the setting of ischemic heart disease modest LV dysfunction pre-CABG.  He also has significant bilateral effusions which together contribute to his shortness of  breath.  Begin diuresis-aggressively.  Check 2D echo to look for LV function.  Begin iron replacement-Feraheme  Have asked pulmonary to see and assist as relates to recurrent pleural effusions.   Signed, Sherryl Manges MD  05/25/2020

## 2020-05-25 NOTE — ED Notes (Signed)
Pt transported to room 36.  Pt state he is feeling better and denies pain at this time Respirations are even and non-labored  Skin is warm ,dry and intact  Provided new warm blankets and call light is within reach Pt updated on plan of care and estimated time for breakfast to be delivered denies further needs at this time

## 2020-05-25 NOTE — H&P (Signed)
Cardiology Admission History and Physical:   Patient ID: Curtis Watkins MRN: [id number]; DOB: [birth year]   Admission date: 05/24/2020  PCP:  Doran Stabler, NP   [facility]  Cardiologist:  Tobias Alexander, MD  Advanced Practice Provider:  No care team member to display Electrophysiologist:  None   Chief Complaint:  "My breathing kept getting worse"  Patient Profile:   Curtis Watkins is a 64 y.o. 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   History of Present Illness:   Curtis Watkins cardiac history began in February of this year when he initially presented with shortness of breath and lower extremity swelling.  He had an echocardiogram performed, and was found to have a mid range ejection fraction with regional wall motion abnormalities.  He underwent left heart catheterization the following day, and was found to have severe two-vessel disease involving the LAD and circumflex.  He underwent four-vessel CABG on February 9.  His postoperative course was relatively uncomplicated and he was discharged on the 13th.  In early March he underwent second and third ray amputation for a known diabetic foot ulcer.  He has been following with Dr. Delton See and Dr. Vickey Sages in clinic.  He was noted to have a large left pleural effusion at his surgical follow-up last month, and underwent thoracentesis on the 24th with over 1-1/2 L of serosanguineous fluid removal.  Curtis Watkins tells me that since his discharge in February he has continued to struggle with shortness of breath and lower extremity edema.  There have been periods where he overall has felt reasonably well and has not been very limited by his shortness of breath.  His  shortness of breath was certainly worse prior to his thoracentesis but did note significant improvement albeit short-lived.  Over the last week or so he has noticed significant and progressive swelling of his lower extremities.  He is not taking Lasix at home.  Over this time period as well, he has become progressively more short of breath.  He now is winded just walking to the bathroom and is sleeping sitting up in his recliner at night.  He has a mild cough with no sputum production.  His sternotomy incision is healing well.  His amputation site has also been healing well with no significant discharge recently.  He denies any recent chest pain or palpitations.  He has not had any blood in his stool or black stools while on aspirin and Plavix.  On presentation to the emergency department he was afebrile and hemodynamically stable.  He was reportedly hypoxic and was started on 2 L nasal cannula.  His EKG did not have any signs of ischemia.  His BNP was highly elevated at over 3000.  He remains stably anemic.  His chest x-ray was notable for bilateral at least moderate pleural effusions.  Past Medical History:  Diagnosis Date  . Carotid artery disease (HCC)    04/01/20: 60-79% RICA stenosis, 1-39% LICA stenosis  . CHF (congestive heart failure) (HCC)   . Coronary artery disease   . Diabetes mellitus without complication (HCC)   . Hypertension   . Ischemic cardiomyopathy   . PAD (peripheral artery disease) (HCC)    occluded right axillary artery 03/29/20  Past Surgical History:  Procedure Laterality Date  . AMPUTATION Right 04/24/2020   Procedure: RIGHT 2ND AND 3RD TOE AMPUTATION;  Surgeon: Nadara Mustarduda, Marcus V, MD;  [facility];  Service: Orthopedics;  Laterality: Right;  . CENTRAL VENOUS CATHETER INSERTION Left 04/03/2020   Procedure: INSERTION OF FEMORAL ARTERIAL LINE;  Surgeon: Linden DolinAtkins, Broadus Z, MD;  Location: [facility] OR;  Service: Open Heart Surgery;  Laterality: Left;  . CORONARY ARTERY BYPASS GRAFT  N/A 04/03/2020   Procedure: CORONARY ARTERY BYPASS GRAFTING (CABG) TIMES FOUR USING LEFT INTERNAL MAMMARY ARTERY AND LEFT RADIAL ARTERY.;  Surgeon: Linden DolinAtkins, Broadus Z, MD;  Location: [facility] OR;  Service: Open Heart Surgery;  Laterality: N/A;  . CORONARY PRESSURE WIRE/FFR WITH 3D MAPPING N/A 03/29/2020   Procedure: Coronary Pressure Wire/FFR w/3D Mapping;  Surgeon: Lyn RecordsSmith, Henry W, MD;  Location: [facility] INVASIVE CV LAB;  Service: Cardiovascular;  Laterality: N/A;  . IR THORACENTESIS ASP PLEURAL SPACE W/IMG GUIDE  05/03/2020  . IR THORACENTESIS ASP PLEURAL SPACE W/IMG GUIDE  05/16/2020  . LEFT HEART CATH AND CORONARY ANGIOGRAPHY N/A 03/29/2020   Procedure: LEFT HEART CATH AND CORONARY ANGIOGRAPHY;  Surgeon: Lyn RecordsSmith, Henry W, MD;  Location: [facility] CV [facility];  Service: Cardiovascular;  Laterality: N/A;  . RADIAL ARTERY HARVEST Left 04/03/2020   Procedure: LEFT RADIAL ARTERY HARVEST;  Surgeon: Linden DolinAtkins, Broadus Z, MD;  Location: [facility] OR;  Service: Open Heart Surgery;  Laterality: Left;  . TEE WITHOUT CARDIOVERSION N/A 04/03/2020   Procedure: TRANSESOPHAGEAL ECHOCARDIOGRAM (TEE);  Surgeon: Linden DolinAtkins, Broadus Z, MD;  [facility];  Service: Open Heart Surgery;  Laterality: N/A;  . UPPER EXTREMITY ANGIOGRAPHY  03/29/2020   Procedure: Upper Extremity Angiography;  Surgeon: Lyn RecordsSmith, Henry W, MD;  [facility];  Service: Cardiovascular;;  rt.  arm     Medications Prior to Admission: Prior to Admission medications   Medication Sig Start Date End Date Taking? Authorizing Provider  aspirin 81 MG tablet Take 1 tablet (81 mg total) by mouth daily. 04/07/20  Yes Doree FudgeZimmerman, Donielle M, PA-C  atorvastatin (LIPITOR) 80 MG tablet Take 1 tablet (80 mg total) by mouth daily. 05/08/20  Yes Cleaver, Thomasene RippleJesse M, NP  brimonidine (ALPHAGAN) 0.2 % ophthalmic solution Place 1 drop into the right eye 3 (three) times daily.   Yes [provider]  clopidogrel (PLAVIX) 75 MG tablet Take 1 tablet (75 mg total) by mouth daily. 05/08/20  Yes  Ronney Astersleaver, Jesse M, NP  metFORMIN (GLUCOPHAGE) 1000 MG tablet Take 1 tablet (1,000 mg total) by mouth daily with breakfast. 05/06/20 05/06/21 Yes Linden DolinAtkins, Broadus Z, MD  metoprolol tartrate (LOPRESSOR) 25 MG tablet Take 0.5 tablets (12.5 mg total) by mouth 2 (two) times daily. 05/08/20  Yes Cleaver, Thomasene RippleJesse M, NP  OVER THE COUNTER MEDICATION Apply 1 application topically daily. Diabetic lotion   Yes [provider]  oxyCODONE-acetaminophen (PERCOCET) 5-325 MG tablet Take 1 tablet by mouth every 4 (four) hours as needed. Patient taking differently: Take 1 tablet by mouth every 4 (four) hours as needed for moderate pain. 04/24/20 04/24/21 Yes Persons, [place] Anne, PA  hydrocerin (EUCERIN) CREA Apply 113 application topically daily. Patient not taking: Reported on 05/25/2020 04/07/20   Ardelle BallsZimmerman, Donielle M, PA-C     Allergies:   No Known Allergies  Social History:   Social History   Socioeconomic History  . Marital status: Single    Spouse name: Not on file  . Number of children: Not on file  . Years of education: Not on  file  . Highest education level: Not on file  Occupational History  . Not on file  Tobacco Use  . Smoking status: Current Every Day Smoker    Packs/day: 0.50    Types: Cigarettes  . Smokeless tobacco: Never Used  Substance and Sexual Activity  . Alcohol use: Not Currently  . Drug use: Not Currently    Frequency: 3.0 times per week    Types: Marijuana  . Sexual activity: Not Currently  Other Topics Concern  . Not on file  Social History Narrative  . Not on file   Social Determinants of Health   Financial Resource Strain: Not on file  Food Insecurity: Not on file  Transportation Needs: Not on file  Physical Activity: Not on file  Stress: Not on file  Social Connections: Not on file  Intimate Partner Violence: Not on file    Family History:   The patient's family history is not on file.    ROS:  Please see the history of present illness.  All other ROS  reviewed and negative.     Physical Exam/Data:   Vitals:   05/24/20 2148 05/25/20 0034 05/25/20 0233 05/25/20 0400  BP: 118/77 106/71 133/86 (!) 123/95  Pulse: 97 93 93 98  Resp: 20 18 20  (!) 22  Temp: 98.1 F (36.7 C)  97.9 F (36.6 C)   TempSrc: Oral  Oral   SpO2: 94% (!) 88% 96% 90%   No intake or output data in the 24 hours ending 05/25/20 0522 Last 3 Weights 05/06/2020 04/24/2020 04/22/2020  Weight (lbs) 179 lb 179 lb 179 lb  Weight (kg) 81.194 kg 81.194 kg 81.194 kg     There is no height or weight on file to calculate BMI.  General:  Well nourished, well developed, in no acute distress HEENT: normal Lymph: no adenopathy Neck: Neck veins near the mandible at 90 degrees  Endocrine:  No thryomegaly Vascular: No carotid bruits; FA pulses 2+ bilaterally without bruits  Cardiac:  normal S1, S2; RRR; no murmur  Lungs:  Upper crackles and then absent breath sounds from about mid lung down BL  Abd: soft, nontender, no hepatomegaly  Ext: Tense LE edema BL to the knee Musculoskeletal:  No deformities, BUE and BLE strength normal and equal Skin: warm and dry  Neuro:  CNs 2-12 intact, no focal abnormalities noted Psych:  Normal affect   EKG:  The ECG that was done 05/24/20 was personally reviewed and demonstrates NSR, wandering baseline but no obvious ischemic changes, likely LVH  Relevant CV Studies: TTE (03/28/20): 1. Left ventricular ejection fraction, by estimation, is 40 to 45%. The  left ventricle has mildly decreased function. There is mild left  ventricular hypertrophy. Left ventricular diastolic parameters are  consistent with Grade II diastolic dysfunction  (pseudonormalization). Elevated left atrial pressure.  2. Right ventricular systolic function is normal. The right ventricular  size is normal.  3. Left atrial size was mildly dilated.  4. Mild mitral valve regurgitation.  5. The inferior vena cava is dilated in size with <50% respiratory  variability, suggesting  right atrial pressure of 15 mmHg.   LHC (03/29/20):  Diabetic with severe two-vessel disease including the LAD and complex disease in the circumflex.  Ischemic cardiomyopathy with LVEF 35 to 40%.  LVEDP is less than 10 mmHg.  Left main is widely patent  Right coronary is widely patent but contains diffuse atherosclerosis without focal narrowing.  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  Laboratory Data:  High Sensitivity Troponin:  No results for input(s): TROPONINIHS in the last 720 hours.    Chemistry Recent Labs  Lab 05/24/20 1813  NA 138  K 4.1  CL 103  CO2 27  GLUCOSE 244*  BUN 19  CREATININE 1.30*  CALCIUM 8.8*  GFRNONAA >60  ANIONGAP 8    No results for input(s): PROT, ALBUMIN, AST, ALT, ALKPHOS, BILITOT in the last 168 hours. Hematology Recent Labs  Lab 05/24/20 1813  WBC 7.8  RBC 3.02*  HGB 8.7*  HCT 27.8*  MCV 92.1  MCH 28.8  MCHC 31.3  RDW 14.6  PLT 408*   BNP Recent Labs  Lab 05/24/20 1813  BNP 3,024.2*    DDimer No results for input(s): DDIMER in the last 168 hours.   Radiology/Studies:  DG Chest 2 View  Result Date: 05/24/2020 CLINICAL DATA:  Worsening short of breath EXAM: CHEST - 2 VIEW COMPARISON:  05/16/2020, 05/03/2020 FINDINGS: Post sternotomy changes. Moderate bilateral pleural effusions slightly increased on the left side. Basilar airspace disease. Obscured cardiomediastinal silhouette. Vascular congestion. No pneumothorax. IMPRESSION: Moderate bilateral pleural effusions with bibasilar airspace disease. Effusion on the left appears increased in size. Vascular congestion. Electronically Signed   By: Jasmine Pang M.D.   On: 05/24/2020 18:58     Assessment and Plan:   #Acute on Chronic Systolic and Diastolic Heart Failure: - Known ICM and now s/p CABG. Presented with at least one week of progressive SOB, LE edema. Markedly edematous on exam with BL pleural effusions  - Hemodynamically stable and sat'ing well on 2L [place]. Does  appear mildly uncomfortable sitting up  - IV lasix 40 mg now in the ED. Will likely need BID-TID dosing but can assess response first  - Start aldactone, empa  - Later will add losartan 25 mg (and can switch to entresto prior to d/c with no washout needed)  - Switch metop to XL 12.5 mg qd  - May require thoracentesis but will try to make progress with diuresis first   #CAD: - Cont ASA/plavix - Cont atorva   #Type 2 Diabetes Mellitus: - Hold metformin  - Starting empa (can increase to 25) - SSI for now   #Normocytic Anemia: - Stably anemic with no signs of symptoms of bleeding  - Would repeat iron studies   Risk Assessment/Risk Scores:  New York Heart Association (NYHA) Functional Class NYHA Class III   Severity of Illness: The appropriate patient status for this patient is OBSERVATION. Observation status is judged to be reasonable and necessary in order to provide the required intensity of service to ensure the patient's safety. The patient's presenting symptoms, physical exam findings, and initial radiographic and laboratory data in the context of their medical condition is felt to place them at decreased risk for further clinical deterioration. Furthermore, it is anticipated that the patient will be medically stable for discharge from the hospital within 2 midnights of admission. The following factors support the patient status of observation.   " The patient's presenting symptoms include SOB. " The physical exam findings include elevated neck veins, crackles, absent breath sounds, LE edema. " The initial radiographic and laboratory data are notable for highly elevated BNP, bilateral pleural effusions.  For questions or updates, please contact [facility] Please consult [email or web address] for contact info under     Signed, Livingston Diones, MD  05/25/2020 5:22 AM

## 2020-05-25 NOTE — Procedures (Signed)
Thoracentesis  Procedure Note  ILIAN WESSELL  [id number]  1955/06/25  Date:05/25/20  Time:3:17 PM   Provider Performing:Kitt Minardi E Marnita Poirier   Procedure: Thoracentesis with imaging guidance (16579)  Indication(s) Pleural Effusion  Consent Risks of the procedure as well as the alternatives and risks of each were explained to the patient and/or caregiver.  Consent for the procedure was obtained and is signed in the bedside chart  Anesthesia Topical only with 1% lidocaine    Time Out Verified patient identification, verified procedure, site/side was marked, verified correct patient position, special equipment/implants available, medications/allergies/relevant history reviewed, required imaging and test results available.   Sterile Technique Maximal sterile technique including full sterile barrier drape, hand hygiene, sterile gown, sterile gloves, mask, hair covering, sterile ultrasound probe cover (if used).  Procedure Description Ultrasound was used to identify appropriate pleural anatomy for placement and overlying skin marked.  Area of drainage cleaned and draped in sterile fashion. Lidocaine was used to anesthetize the skin and subcutaneous tissue.  1400 ml's of serosanguinous appearing fluid was drained from the right pleural space. Catheter then removed and bandaid applied to site.   Complications/Tolerance None; patient tolerated the procedure well. Chest X-ray is ordered to confirm no post-procedural complication.   EBL Minimal   Specimen(s) None  Tessie Fass MSN, AGACNP-BC [facility] [phone] If no answer, [id number] 05/25/2020, 3:19 PM

## 2020-05-25 NOTE — Consult Note (Signed)
NAME:  Curtis Watkins, MRN:  [id number], DOB:  1955/07/08, LOS: 0 ADMISSION DATE:  05/24/2020, CONSULTATION DATE:  05/25/20 REFERRING MD:  Joyce Gross, CHIEF COMPLAINT:  SOB   History of Present Illness:  65 year old man with hx of ischemic cardiomyopathy and recent CABG presenting with recurrent volume overload and large bilateral effusions.  DOE, orthopnea, and LE edema progressed over past week.  MMRC1-2 now.  Has required thoracenteses in past.  Denies infectious symptoms.  Not tracking his home weight.  PCCM consulted to consider thoracenteses to help breathing status.  Pertinent  Medical History  Ischemic cardiomyopathy PAD HTN DM Carotid artery disease  Significant Hospital Events: Including procedures, antibiotic start and stop dates in addition to other pertinent events   . 4/2 admitted  Interim History / Subjective:  Consulted, ROS as below.  Objective   Blood pressure 103/78, pulse 91, temperature 98.4 F (36.9 C), temperature source Oral, resp. rate 19, height 5\' 11"  (1.803 m), weight 81.4 kg, SpO2 96 %.        Intake/Output Summary (Last 24 hours) at 05/25/2020 1541 Last data filed at 05/25/2020 1200 Gross per 24 hour  Intake 720 ml  Output --  Net 720 ml   Filed Weights   05/25/20 07/25/20 05/25/20 0936  Weight: 81.6 kg 81.4 kg    Examination: Constitutional: no acute distress sitting in bed  Eyes: EOMI, pupils equal and reactive Ears, nose, mouth, and throat: MMM, +JVD Cardiovascular: RRR, ext warm Respiratory: Diminished with crackles at bases, no accessory muscle use Gastrointestinal: soft, +BS Skin: No rashes, normal turgor Neurologic: moves all 4 ext to command Psychiatric: AOx3, fair insight Ext: 3+ pitting edema to thighs   Labs/imaging that I havepersonally reviewed  (right click and "Reselect all SmartList Selections" daily)  BNP up CXR with moderate-large bilateral effusions Weight stable from 16 days ago CKD2 stable  Resolved Hospital Problem  list   n/a  Assessment & Plan:  Symptomatic post-CABG effusions Volume overloaded state of heart  - Diuresis as you are doing - Will drain pleural effusions for symptomatic effect - Please reach out if we can be of any further assistance  Best practice (right click and "Reselect all SmartList Selections" daily)  Per primary  Labs   CBC: Recent Labs  Lab 05/24/20 1813  WBC 7.8  HGB 8.7*  HCT 27.8*  MCV 92.1  PLT 408*    Basic Metabolic Panel: Recent Labs  Lab 05/24/20 1813  NA 138  K 4.1  CL 103  CO2 27  GLUCOSE 244*  BUN 19  CREATININE 1.30*  CALCIUM 8.8*   GFR: Estimated Creatinine Clearance: 61.1 mL/min (A) (by C-G formula based on SCr of 1.3 mg/dL (H)). Recent Labs  Lab 05/24/20 1813  WBC 7.8    Liver Function Tests: No results for input(s): AST, ALT, ALKPHOS, BILITOT, PROT, ALBUMIN in the last 168 hours. No results for input(s): LIPASE, AMYLASE in the last 168 hours. No results for input(s): AMMONIA in the last 168 hours.  ABG    Component Value Date/Time   PHART 7.332 (L) 04/03/2020 2009   PCO2ART 36.8 04/03/2020 2009   PO2ART 87 04/03/2020 2009   HCO3 19.7 (L) 04/03/2020 2009   TCO2 21 (L) 04/03/2020 2009   ACIDBASEDEF 6.0 (H) 04/03/2020 2009   O2SAT 67.0 04/05/2020 0210     Coagulation Profile: No results for input(s): INR, PROTIME in the last 168 hours.  Cardiac Enzymes: No results for input(s): CKTOTAL, CKMB, CKMBINDEX, TROPONINI in  the last 168 hours.  HbA1C: Hgb A1c MFr Bld  Date/Time Value Ref Range Status  03/28/2020 07:15 PM 6.8 (H) 4.8 - 5.6 % Final    Comment:    (NOTE) Pre diabetes:          5.7%-6.4%  Diabetes:              >6.4%  Glycemic control for   <7.0% adults with diabetes     CBG: Recent Labs  Lab 05/25/20 0806 05/25/20 1201  GLUCAP 163* 207*    Review of Systems:    Positive Symptoms in bold:  Constitutional fevers, chills, weight loss, fatigue, anorexia, malaise  Eyes decreased vision, double  vision, eye irritation  Ears, Nose, Mouth, Throat sore throat, trouble swallowing, sinus congestion  Cardiovascular chest pain, paroxysmal nocturnal dyspnea, lower ext edema, palpitations   Respiratory SOB, cough, DOE, hemoptysis, wheezing  Gastrointestinal nausea, vomiting, diarrhea  Genitourinary burning with urination, trouble urinating  Musculoskeletal joint aches, joint swelling, back pain  Integumentary  rashes, skin lesions  Neurological focal weakness, focal numbness, trouble speaking, headaches  Psychiatric depression, anxiety, confusion  Endocrine polyuria, polydipsia, cold intolerance, heat intolerance  Hematologic abnormal bruising, abnormal bleeding, unexplained nose bleeds  Allergic/Immunologic recurrent infections, hives, swollen lymph nodes     Past Medical History:  He,  has a past medical history of Carotid artery disease (HCC), CHF (congestive heart failure) (HCC), Coronary artery disease, Diabetes mellitus without complication (HCC), Hypertension, Ischemic cardiomyopathy, and PAD (peripheral artery disease) (HCC).   Surgical History:   Past Surgical History:  Procedure Laterality Date  . AMPUTATION Right 04/24/2020   Procedure: RIGHT 2ND AND 3RD TOE AMPUTATION;  Surgeon: Nadara Mustard, MD;  Location: [facility] OR;  Service: Orthopedics;  Laterality: Right;  . CENTRAL VENOUS CATHETER INSERTION Left 04/03/2020   Procedure: INSERTION OF FEMORAL ARTERIAL LINE;  Surgeon: Linden Dolin, MD;  Location: [facility] OR;  Service: Open Heart Surgery;  Laterality: Left;  . CORONARY ARTERY BYPASS GRAFT N/A 04/03/2020   Procedure: CORONARY ARTERY BYPASS GRAFTING (CABG) TIMES FOUR USING LEFT INTERNAL MAMMARY ARTERY AND LEFT RADIAL ARTERY.;  Surgeon: Linden Dolin, MD;  Location: [facility] OR;  Service: Open Heart Surgery;  Laterality: N/A;  . CORONARY PRESSURE WIRE/FFR WITH 3D MAPPING N/A 03/29/2020   Procedure: Coronary Pressure Wire/FFR w/3D Mapping;  Surgeon: Lyn Records, MD;  Location: [facility]  INVASIVE CV LAB;  Service: Cardiovascular;  Laterality: N/A;  . IR THORACENTESIS ASP PLEURAL SPACE W/IMG GUIDE  05/03/2020  . IR THORACENTESIS ASP PLEURAL SPACE W/IMG GUIDE  05/16/2020  . LEFT HEART CATH AND CORONARY ANGIOGRAPHY N/A 03/29/2020   Procedure: LEFT HEART CATH AND CORONARY ANGIOGRAPHY;  Surgeon: Lyn Records, MD;  [facility]: [facility] INVASIVE CV LAB;  Service: Cardiovascular;  Laterality: N/A;  . RADIAL ARTERY HARVEST Left 04/03/2020   Procedure: LEFT RADIAL ARTERY HARVEST;  Surgeon: Linden Dolin, MD;  Location: [facility] OR;  Service: Open Heart Surgery;  Laterality: Left;  . TEE WITHOUT CARDIOVERSION N/A 04/03/2020   Procedure: TRANSESOPHAGEAL ECHOCARDIOGRAM (TEE);  Surgeon: Linden Dolin, MD;  Location: [facility] OR;  Service: Open Heart Surgery;  Laterality: N/A;  . UPPER EXTREMITY ANGIOGRAPHY  03/29/2020   Procedure: Upper Extremity Angiography;  Surgeon: Lyn Records, MD;  Location: [facility] INVASIVE CV LAB;  Service: Cardiovascular;;  rt.  arm     Social History:   reports that he has been smoking cigarettes. He has been smoking about 0.50 packs per day. He has never used smokeless  tobacco. He reports previous alcohol use. He reports previous drug use. Frequency: 3.00 times per week. Drug: Marijuana.   Family History:  His family history is not on file.   Allergies No Known Allergies   Home Medications  Prior to Admission medications   Medication Sig Start Date End Date Taking? Authorizing Provider  aspirin 81 MG tablet Take 1 tablet (81 mg total) by mouth daily. 04/07/20  Yes Doree Fudge M, PA-C  atorvastatin (LIPITOR) 80 MG tablet Take 1 tablet (80 mg total) by mouth daily. 05/08/20  Yes Cleaver, Thomasene Ripple, NP  brimonidine (ALPHAGAN) 0.2 % ophthalmic solution Place 1 drop into the right eye 3 (three) times daily.   Yes [provider]  clopidogrel (PLAVIX) 75 MG tablet Take 1 tablet (75 mg total) by mouth daily. 05/08/20  Yes Ronney Asters, NP  metFORMIN (GLUCOPHAGE) 1000 MG  tablet Take 1 tablet (1,000 mg total) by mouth daily with breakfast. 05/06/20 05/06/21 Yes Linden Dolin, MD  metoprolol tartrate (LOPRESSOR) 25 MG tablet Take 0.5 tablets (12.5 mg total) by mouth 2 (two) times daily. 05/08/20  Yes Cleaver, Thomasene Ripple, NP  OVER THE COUNTER MEDICATION Apply 1 application topically daily. Diabetic lotion   Yes [provider]  oxyCODONE-acetaminophen (PERCOCET) 5-325 MG tablet Take 1 tablet by mouth every 4 (four) hours as needed. Patient taking differently: Take 1 tablet by mouth every 4 (four) hours as needed for moderate pain. 04/24/20 04/24/21 Yes Persons, [place], PA  hydrocerin (EUCERIN) CREA Apply 113 application topically daily. Patient not taking: Reported on 05/25/2020 04/07/20   Ardelle Balls, PA-C

## 2020-05-25 NOTE — Procedures (Signed)
Thoracentesis  Procedure Note  Curtis Watkins  [id number]  06/17/1955  Date:05/25/20  Time:3:52 PM   Provider Performing:Riku Buttery C Katrinka Blazing   Procedure: Thoracentesis with imaging guidance (98921)  Indication(s) Pleural Effusion  Consent Risks of the procedure as well as the alternatives and risks of each were explained to the patient and/or caregiver.  Consent for the procedure was obtained and is signed in the bedside chart  Anesthesia Topical only with 1% lidocaine    Time Out Verified patient identification, verified procedure, site/side was marked, verified correct patient position, special equipment/implants available, medications/allergies/relevant history reviewed, required imaging and test results available.   Sterile Technique Maximal sterile technique including full sterile barrier drape, hand hygiene, sterile gown, sterile gloves, mask, hair covering, sterile ultrasound probe cover (if used).  Procedure Description Ultrasound was used to identify appropriate pleural anatomy for placement and overlying skin marked.  Area of drainage cleaned and draped in sterile fashion. Lidocaine was used to anesthetize the skin and subcutaneous tissue.  1500 cc's of bloody appearing fluid was drained from the left pleural space. Catheter then removed and bandaid applied to site.   Complications/Tolerance None; patient tolerated the procedure well. Chest X-ray is ordered to confirm no post-procedural complication.   EBL Minimal   Specimen(s) None

## 2020-05-25 NOTE — ED Provider Notes (Signed)
[facility] EMERGENCY DEPARTMENT Provider Note   CSN: [id number] Arrival date & time: 05/24/20  1735     History Chief Complaint  Patient presents with  . Shortness of Breath    Curtis Watkins is a 65 y.o. male with a history of a CAD s/p CABG x4, PAD, diabetes mellitus, gangrene of toe of right foot, HTN who presents the emergency department with a chief complaint of shortness of breath.   The patient reports that he has been feeling constantly short of breath since he had a CABG in February.  However, over the last few days, shortness of breath has significantly worsened.  He has associated bilateral lower extremity swelling and orthopnea.  No known aggravating or alleviating factors.  He has been sleeping in a recliner since his surgery.  He is unsure if he has gained weight.  He has not on any home diuretics.  States that he had a history of similar prior to his surgery in February and was told that he had fluid on his lungs.  He denies fever, chills, chest pain, cough, palpitations, abdominal swelling, nausea, vomiting, diarrhea, rash.  No treatment prior to arrival.  The history is provided by the patient. No language interpreter was used.       Past Medical History:  Diagnosis Date  . Carotid artery disease (HCC)    04/01/20: 60-79% RICA stenosis, 1-39% LICA stenosis  . CHF (congestive heart failure) (HCC)   . Coronary artery disease   . Diabetes mellitus without complication (HCC)   . Hypertension   . Ischemic cardiomyopathy   . PAD (peripheral artery disease) (HCC)    occluded right axillary artery 03/29/20     Patient Active Problem List   Diagnosis Date Noted  . Gangrene of toe of right foot (HCC)   . 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 Coronary artery disease involving native coronary artery of native heart with unstable angina pectoris (HCC)   . Acute congestive heart failure (HCC) 03/27/2020  . Diabetic foot ulcers (HCC) 03/27/2020  . DM2 (diabetes  mellitus, type 2) (HCC) 03/27/2020  . HTN (hypertension) 03/27/2020    Past Surgical History:  Procedure Laterality Date  . AMPUTATION Right 04/24/2020   Procedure: RIGHT 2ND AND 3RD TOE AMPUTATION;  Surgeon: Nadara Mustarduda, Marcus V, MD;  [facility];  Service: Orthopedics;  Laterality: Right;  . CENTRAL VENOUS CATHETER INSERTION Left 04/03/2020   Procedure: INSERTION OF FEMORAL ARTERIAL LINE;  Surgeon: Linden DolinAtkins, Broadus Z, MD;  Location: [facility] OR;  Service: Open Heart Surgery;  Laterality: Left;  . CORONARY ARTERY BYPASS GRAFT N/A 04/03/2020   Procedure: CORONARY ARTERY BYPASS GRAFTING (CABG) TIMES FOUR USING LEFT INTERNAL MAMMARY ARTERY AND LEFT RADIAL ARTERY.;  Surgeon: Linden DolinAtkins, Broadus Z, MD;  Location: [facility] OR;  Service: Open Heart Surgery;  Laterality: N/A;  . CORONARY PRESSURE WIRE/FFR WITH 3D MAPPING N/A 03/29/2020   Procedure: Coronary Pressure Wire/FFR w/3D Mapping;  Surgeon: Lyn RecordsSmith, Henry W, MD;  Location: [facility] INVASIVE CV LAB;  Service: Cardiovascular;  Laterality: N/A;  . IR THORACENTESIS ASP PLEURAL SPACE W/IMG GUIDE  05/03/2020  . IR THORACENTESIS ASP PLEURAL SPACE W/IMG GUIDE  05/16/2020  . LEFT HEART CATH AND CORONARY ANGIOGRAPHY N/A 03/29/2020   Procedure: LEFT HEART CATH AND CORONARY ANGIOGRAPHY;  Surgeon: Lyn RecordsSmith, Henry W, MD;  Location: [facility] INVASIVE CV LAB;  Service: Cardiovascular;  Laterality: N/A;  . RADIAL ARTERY HARVEST Left 04/03/2020   Procedure: LEFT RADIAL ARTERY HARVEST;  Surgeon: Valentina LucksAtkins, Broadus  Z, MD;  Location: [facility] OR;  Service: Open Heart Surgery;  Laterality: Left;  . TEE WITHOUT CARDIOVERSION N/A 04/03/2020   Procedure: TRANSESOPHAGEAL ECHOCARDIOGRAM (TEE);  Surgeon: Linden Dolin, MD;  Location: [facility] OR;  Service: Open Heart Surgery;  Laterality: N/A;  . UPPER EXTREMITY ANGIOGRAPHY  03/29/2020   Procedure: Upper Extremity Angiography;  Surgeon: Lyn Records, MD;  Location: [facility] INVASIVE CV LAB;  Service: Cardiovascular;;  rt.  arm       No family history on file.  Social History    Tobacco Use  . Smoking status: Current Every Day Smoker    Packs/day: 0.50    Types: Cigarettes  . Smokeless tobacco: Never Used  Substance Use Topics  . Alcohol use: Not Currently  . Drug use: Not Currently    Frequency: 3.0 times per week    Types: Marijuana    Home Medications Prior to Admission medications   Medication Sig Start Date End Date Taking? Authorizing Provider  aspirin 81 MG tablet Take 1 tablet (81 mg total) by mouth daily. 04/07/20   Ardelle Balls, PA-C  atorvastatin (LIPITOR) 80 MG tablet Take 1 tablet (80 mg total) by mouth daily. 05/08/20   Ronney Asters, NP  clopidogrel (PLAVIX) 75 MG tablet Take 1 tablet (75 mg total) by mouth daily. 05/08/20   Ronney Asters, NP  hydrocerin (EUCERIN) CREA Apply 113 application topically daily. 04/07/20   Ardelle Balls, PA-C  metFORMIN (GLUCOPHAGE) 1000 MG tablet Take 1,000 mg by mouth daily with breakfast. 12/12/19   [provider]  metFORMIN (GLUCOPHAGE) 1000 MG tablet Take 1 tablet (1,000 mg total) by mouth daily with breakfast. 05/06/20 05/06/21  Linden Dolin, MD  metoprolol tartrate (LOPRESSOR) 25 MG tablet Take 0.5 tablets (12.5 mg total) by mouth 2 (two) times daily. 05/08/20   Ronney Asters, NP  oxyCODONE-acetaminophen (PERCOCET) 5-325 MG tablet Take 1 tablet by mouth every 4 (four) hours as needed. 04/24/20 04/24/21  Persons, [place], PA    Allergies    Patient has no known allergies.  Review of Systems   Review of Systems  Constitutional: Negative for appetite change and fever.  HENT: Negative for congestion and sore throat.   Eyes: Negative for visual disturbance.  Respiratory: Positive for shortness of breath.   Cardiovascular: Positive for leg swelling. Negative for chest pain and palpitations.  Gastrointestinal: Negative for abdominal pain, diarrhea, nausea and vomiting.  Genitourinary: Negative for dysuria.  Musculoskeletal: Negative for back pain, myalgias, neck pain and neck  stiffness.  Skin: Negative for rash.  Allergic/Immunologic: Negative for immunocompromised state.  Neurological: Negative for dizziness, seizures, syncope, weakness, numbness and headaches.  Psychiatric/Behavioral: Negative for confusion.    Physical Exam Updated Vital Signs BP (!) 123/95   Pulse 98   Temp 97.9 F (36.6 C) (Oral)   Resp (!) 22   SpO2 90%   Physical Exam Vitals and nursing note reviewed.  Constitutional:      Appearance: He is well-developed.     Comments: Chronically ill-appearing  HENT:     Head: Normocephalic.  Eyes:     Conjunctiva/sclera: Conjunctivae normal.  Neck:     Comments: + JVD Cardiovascular:     Rate and Rhythm: Normal rate and regular rhythm.     Pulses: Normal pulses.     Heart sounds: Normal heart sounds. No murmur heard. No friction rub. No gallop.   Pulmonary:     Effort: Pulmonary effort is normal.  Comments: Crackles noted to the bilateral bases and mid lungs.  No retractions or accessory muscle use.  Well-healing midline scar to the sternum. Abdominal:     General: There is no distension.     Palpations: Abdomen is soft. There is no mass.     Tenderness: There is no abdominal tenderness. There is no right CVA tenderness, left CVA tenderness, guarding or rebound.     Hernia: No hernia is present.  Musculoskeletal:        General: No tenderness.     Cervical back: Neck supple.     Right lower leg: Edema present.     Left lower leg: Edema present.     Comments: 3+ pitting edema to the bilateral lower extremities.  Skin:    General: Skin is warm and dry.  Neurological:     Mental Status: He is alert.  Psychiatric:        Behavior: Behavior normal.     ED Results / Procedures / Treatments   Labs (all labs ordered are listed, but only abnormal results are displayed) Labs Reviewed  BASIC METABOLIC PANEL - Abnormal; Notable for the following components:      Result Value   Glucose, Bld 244 (*)    Creatinine, Ser 1.30 (*)     Calcium 8.8 (*)    All other components within normal limits  CBC - Abnormal; Notable for the following components:   RBC 3.02 (*)    Hemoglobin 8.7 (*)    HCT 27.8 (*)    Platelets 408 (*)    All other components within normal limits  BRAIN NATRIURETIC PEPTIDE - Abnormal; Notable for the following components:   B Natriuretic Peptide 3,024.2 (*)    All other components within normal limits  SARS CORONAVIRUS 2 (TAT 6-24 HRS)    EKG EKG Interpretation  Date/Time:  Friday May 24 2020 17:45:12 EDT Ventricular Rate:  87 PR Interval:  138 QRS Duration: 74 QT Interval:  392 QTC Calculation: 471 R Axis:   52 Text Interpretation: Normal sinus rhythm ST-t wave abnormality Prolonged QT Abnormal ECG Confirmed by Gerhard Munch [phone]) on 05/24/2020 6:05:28 PM   Radiology DG Chest 2 View  Result Date: 05/24/2020 CLINICAL DATA:  Worsening short of breath EXAM: CHEST - 2 VIEW COMPARISON:  05/16/2020, 05/03/2020 FINDINGS: Post sternotomy changes. Moderate bilateral pleural effusions slightly increased on the left side. Basilar airspace disease. Obscured cardiomediastinal silhouette. Vascular congestion. No pneumothorax. IMPRESSION: Moderate bilateral pleural effusions with bibasilar airspace disease. Effusion on the left appears increased in size. Vascular congestion. Electronically Signed   By: Jasmine Pang M.D.   On: 05/24/2020 18:58    Procedures .Critical Care Performed by: Barkley Boards, PA-C Authorized by: Barkley Boards, PA-C   Critical care provider statement:    Critical care time (minutes):  35   Critical care time was exclusive of:  Separately billable procedures and treating other patients and teaching time   Critical care was necessary to treat or prevent imminent or life-threatening deterioration of the following conditions:  Respiratory failure and cardiac failure   Critical care was time spent personally by me on the following activities:  Ordering and performing  treatments and interventions, ordering and review of laboratory studies, ordering and review of radiographic studies, pulse oximetry, re-evaluation of patient's condition, review of old charts, obtaining history from patient or surrogate, examination of patient, evaluation of patient's response to treatment, development of treatment plan with patient or surrogate and discussions with consultants  I assumed direction of critical care for this patient from another provider in my specialty: no     Care discussed with: admitting provider       Medications Ordered in ED Medications  furosemide (LASIX) injection 40 mg (has no administration in time range)    ED Course  I have reviewed the triage vital signs and the nursing notes.  Pertinent labs & imaging results that were available during my care of the patient were reviewed by me and considered in my medical decision making (see chart for details).    MDM Rules/Calculators/A&P                          65 year old male with a history of a CAD s/p CABG x4, PAD, diabetes mellitus, gangrene of toe of right foot, HTN who presents to the emergency department with shortness of breath for the last month and a half that is significantly worsened over the last few days accompanied by orthopnea and peripheral edema.  Hypoxic at 88% on room air.  However, at rest in bed oxygen saturations in the low 90s on room air.  Vital signs are otherwise stable.  The patient was discussed with Dr. Bebe Shaggy, attending physician.  Suspect CHF exacerbation as patient has crackles on exam, 3+ pitting edema with JVD.  BNP is 3024.  Chest x-ray with bilateral pleural effusions vascular congestion.  He is anemic, but hemoglobin is stable from previous.  No metabolic derangements.  EKG without evidence of ischemia.  Patient is adamantly denying chest pain.  Consult to cardiology and Dr. Joyce Gross with cardiology will accept the patient for admission.  40 mg of Lasix has been  ordered.  The patient appears reasonably stabilized for admission considering the current resources, flow, and capabilities available in the ED at this time, and I doubt any other [facility] requiring further screening and/or treatment in the ED prior to admission.  Final Clinical Impression(s) / ED Diagnoses Final diagnoses:  Acute congestive heart failure, unspecified heart failure type [facility])    Rx / DC Orders ED Discharge Orders    None       Barkley Boards, PA-C 05/25/20 0439    Zadie Rhine, MD 05/25/20 2347

## 2020-05-26 ENCOUNTER — Inpatient Hospital Stay (HOSPITAL_COMMUNITY): Payer: Medicaid Other

## 2020-05-26 DIAGNOSIS — I471 Supraventricular tachycardia: Secondary | ICD-10-CM

## 2020-05-26 DIAGNOSIS — I5043 Acute on chronic combined systolic (congestive) and diastolic (congestive) heart failure: Secondary | ICD-10-CM

## 2020-05-26 LAB — BASIC METABOLIC PANEL
Anion gap: 8 (ref 5–15)
BUN: 21 mg/dL (ref 8–23)
CO2: 27 mmol/L (ref 22–32)
Calcium: 8.5 mg/dL — ABNORMAL LOW (ref 8.9–10.3)
Chloride: 102 mmol/L (ref 98–111)
Creatinine, Ser: 1.47 mg/dL — ABNORMAL HIGH (ref 0.61–1.24)
GFR, Estimated: 53 mL/min — ABNORMAL LOW (ref >60)
Glucose, Bld: 135 mg/dL — ABNORMAL HIGH (ref 70–99)
Potassium: 4 mmol/L (ref 3.5–5.1)
Sodium: 137 mmol/L (ref 135–145)

## 2020-05-26 LAB — GLUCOSE, CAPILLARY
Glucose-Capillary: 123 mg/dL — ABNORMAL HIGH (ref 70–99)
Glucose-Capillary: 171 mg/dL — ABNORMAL HIGH (ref 70–99)
Glucose-Capillary: 266 mg/dL — ABNORMAL HIGH (ref 70–99)
Glucose-Capillary: 132 mg/dL — ABNORMAL HIGH (ref 70–99)

## 2020-05-26 LAB — ECHOCARDIOGRAM LIMITED
Calc EF: 22.5 %
Height: 71 in
S' Lateral: 4.4 cm
Single Plane A2C EF: 23 %
Single Plane A4C EF: 15.2 %
Weight: 2644.8 oz

## 2020-05-26 MED ORDER — PERFLUTREN LIPID MICROSPHERE
1.0000 mL | INTRAVENOUS | Status: AC | PRN
  Administered 2020-05-26: 2 mL via INTRAVENOUS
  Filled 2020-05-26: qty 10

## 2020-05-26 MED ORDER — FUROSEMIDE 10 MG/ML IJ SOLN
80.0000 mg | Freq: Every day | INTRAMUSCULAR | Status: DC
  Administered 2020-05-27: 80 mg via INTRAVENOUS
  Filled 2020-05-26: qty 8

## 2020-05-26 NOTE — Progress Notes (Signed)
  Echocardiogram 2D Echocardiogram with contrast has been performed.  Curtis Watkins 05/26/2020, 4:39 PM

## 2020-05-26 NOTE — Progress Notes (Addendum)
Held patient lasix, metoprolol, and aldactone this morning due to low BP. Notifying Cardiology and waiting for call back.  1240: Per MD Izora Ribas, ok to administer medications. In to see patient at bedside.

## 2020-05-26 NOTE — Progress Notes (Addendum)
Progress Note  Patient Name: Curtis Watkins Date of Encounter: 05/26/2020  Primary Cardiologist: Tobias Alexander, MD  (former)  Subjective   Overnight has had AKI.  Nursing held her AM medications with questions of low BP.  With this, patient has felt no symptoms.  Notes that post thoracentesis he feels much better.  Inpatient Medications    Scheduled Meds: . aspirin EC  81 mg Oral Daily  . atorvastatin  80 mg Oral Daily  . clopidogrel  75 mg Oral Daily  . empagliflozin  10 mg Oral Daily  . furosemide  80 mg Intravenous BID  . heparin  5,000 Units Subcutaneous Q8H  . insulin aspart  0-15 Units Subcutaneous TID WC  . insulin aspart  0-5 Units Subcutaneous QHS  . losartan  25 mg Oral Daily  . metoprolol succinate  12.5 mg Oral Daily  . sodium chloride flush  3 mL Intravenous Q12H  . spironolactone  25 mg Oral Daily   Continuous Infusions: . sodium chloride     PRN Meds: sodium chloride, acetaminophen, ondansetron (ZOFRAN) IV, sodium chloride flush   Vital Signs    Vitals:   05/25/20 2328 05/26/20 0339 05/26/20 0802 05/26/20 1201  BP: 104/87 108/69 97/70 101/78  Pulse: 96 88 97 95  Resp: 20 20  18   Temp: 98.9 F (37.2 C) 98.3 F (36.8 C)  98.4 F (36.9 C)  TempSrc: Oral Oral  Oral  SpO2: 98% 92%  95%  Weight:  75 kg    Height:        Intake/Output Summary (Last 24 hours) at 05/26/2020 1208 Last data filed at 05/26/2020 0711 Gross per 24 hour  Intake 480 ml  Output 1000 ml  Net -520 ml   Filed Weights   05/25/20 0633 05/25/20 0936 05/26/20 0339  Weight: 81.6 kg 81.4 kg 75 kg    Telemetry    1130 SVT short run - Personally Reviewed  ECG    SR 87 anterolateralTWI - Personally Reviewed  Physical Exam   GEN: No acute distress.   Neck: No JVD Cardiac: RRR, no murmurs, rubs, or gallops.  Respiratory: Coarse breath sounds GI: Soft, nontender, non-distended  MS: +2 edema; No deformity. Neuro:  Nonfocal  Psych: Normal affect   Labs    Chemistry Recent  Labs  Lab 05/24/20 1813 05/26/20 0242  NA 138 137  K 4.1 4.0  CL 103 102  CO2 27 27  GLUCOSE 244* 135*  BUN 19 21  CREATININE 1.30* 1.47*  CALCIUM 8.8* 8.5*  GFRNONAA >60 53*  ANIONGAP 8 8     Hematology Recent Labs  Lab 05/24/20 1813  WBC 7.8  RBC 3.02*  HGB 8.7*  HCT 27.8*  MCV 92.1  MCH 28.8  MCHC 31.3  RDW 14.6  PLT 408*    Cardiac EnzymesNo results for input(s): TROPONINI in the last 168 hours. No results for input(s): TROPIPOC in the last 168 hours.   BNP Recent Labs  Lab 05/24/20 1813  BNP 3,024.2*     DDimer No results for input(s): DDIMER in the last 168 hours.   Radiology    DG Chest 2 View  Result Date: 05/24/2020 CLINICAL DATA:  Worsening short of breath EXAM: CHEST - 2 VIEW COMPARISON:  05/16/2020, 05/03/2020 FINDINGS: Post sternotomy changes. Moderate bilateral pleural effusions slightly increased on the left side. Basilar airspace disease. Obscured cardiomediastinal silhouette. Vascular congestion. No pneumothorax. IMPRESSION: Moderate bilateral pleural effusions with bibasilar airspace disease. Effusion on the left appears increased  in size. Vascular congestion. Electronically Signed   By: Jasmine Pang M.D.   On: 05/24/2020 18:58   DG CHEST PORT 1 VIEW  Result Date: 05/26/2020 CLINICAL DATA:  Evaluate for pneumothorax and bilateral pleural effusions. EXAM: PORTABLE CHEST 1 VIEW COMPARISON:  05/25/2020; 05/24/2020; 05/16/2020 FINDINGS: Grossly unchanged cardiac silhouette and mediastinal contours post median sternotomy. Pulmonary vasculature remains indistinct with cephalization of flow. Grossly unchanged small layering bilateral effusions with associated bibasilar opacities favored to represent atelectasis. No new focal airspace opacities. No pneumothorax. No acute osseous abnormalities. IMPRESSION: Grossly unchanged findings of pulmonary edema, small layering bilateral effusions and associated bibasilar atelectasis. Electronically Signed   By: Simonne Come M.D.   On: 05/26/2020 09:04   DG CHEST PORT 1 VIEW  Result Date: 05/25/2020 CLINICAL DATA:  65 year old male status post thoracentesis. EXAM: PORTABLE CHEST 1 VIEW COMPARISON:  Chest radiograph dated 05/24/2020. FINDINGS: No pneumothorax post thoracentesis. Interval decrease in the size of bilateral pleural effusions and improved aeration of the lungs compared to the prior radiograph. There are bibasilar atelectasis. Pneumonia is not excluded. Stable cardiomediastinal silhouette. Median sternotomy wires and CABG vascular clips. No acute osseous pathology. IMPRESSION: Interval decrease in the size of bilateral pleural effusions and improved aeration of the lungs compared to the prior radiograph. No pneumothorax. Electronically Signed   By: Elgie Collard M.D.   On: 05/25/2020 15:42    Cardiac Studies   03/28/20 1. Left ventricular ejection fraction, by estimation, is 40 to 45%. The  left ventricle has mildly decreased function. There is mild left  ventricular hypertrophy. Left ventricular diastolic parameters are  consistent with Grade II diastolic dysfunction  (pseudonormalization). Elevated left atrial pressure.  2. Right ventricular systolic function is normal. The right ventricular  size is normal.  3. Left atrial size was mildly dilated.  4. Mild mitral valve regurgitation.  5. The inferior vena cava is dilated in size with <50% respiratory  variability, suggesting right atrial pressure of 15 mmHg.   Patient Profile     65 y.o. 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      Signed, Christell Constant, MD  05/26/2020, 12:08 PM

## 2020-05-27 ENCOUNTER — Other Ambulatory Visit (HOSPITAL_COMMUNITY): Payer: Self-pay

## 2020-05-27 LAB — GLUCOSE, CAPILLARY
Glucose-Capillary: 128 mg/dL — ABNORMAL HIGH (ref 70–99)
Glucose-Capillary: 149 mg/dL — ABNORMAL HIGH (ref 70–99)
Glucose-Capillary: 217 mg/dL — ABNORMAL HIGH (ref 70–99)
Glucose-Capillary: 134 mg/dL — ABNORMAL HIGH (ref 70–99)

## 2020-05-27 LAB — BASIC METABOLIC PANEL
Anion gap: 9 (ref 5–15)
BUN: 23 mg/dL (ref 8–23)
CO2: 28 mmol/L (ref 22–32)
Calcium: 8.5 mg/dL — ABNORMAL LOW (ref 8.9–10.3)
Chloride: 103 mmol/L (ref 98–111)
Creatinine, Ser: 1.41 mg/dL — ABNORMAL HIGH (ref 0.61–1.24)
GFR, Estimated: 56 mL/min — ABNORMAL LOW (ref >60)
Glucose, Bld: 113 mg/dL — ABNORMAL HIGH (ref 70–99)
Potassium: 3.4 mmol/L — ABNORMAL LOW (ref 3.5–5.1)
Sodium: 140 mmol/L (ref 135–145)

## 2020-05-27 MED ORDER — LOSARTAN POTASSIUM 25 MG PO TABS
12.5000 mg | ORAL_TABLET | Freq: Every day | ORAL | Status: DC
  Administered 2020-05-27: 12.5 mg via ORAL
  Filled 2020-05-27 (×2): qty 1

## 2020-05-27 MED ORDER — POTASSIUM CHLORIDE CRYS ER 20 MEQ PO TBCR
40.0000 meq | EXTENDED_RELEASE_TABLET | Freq: Once | ORAL | Status: AC
  Administered 2020-05-27: 40 meq via ORAL
  Filled 2020-05-27: qty 2

## 2020-05-27 NOTE — Progress Notes (Signed)
Called by RN BP 86 systolic, he had losartan recently. No meds further tonight, currently warm and asymptomatic. Asked nurse to keep in bed and I stopped lasix for now. Readdress in AM

## 2020-05-27 NOTE — Progress Notes (Addendum)
Spoke with Nada Boozer, NP about patient being hypotensive. The patient is warm and dry and feels totally fine so we are going to just continue to monitor the patient. She wants the patient to stay in bed for safety so I will pass this along to the patient.   I informed Mr. Dollar that he needs to stay in bed and call for assistance when he needs to get up per doctor's order. I explained that his blood pressure could drop even lower and it could cause him to pass out. However, he says he feels fine and wants to continue to sit up in the chair and listen to music. He says he will call me if he feels dizzy or lightheaded.

## 2020-05-27 NOTE — Progress Notes (Signed)
Progress Note  Patient Name: Curtis Watkins Date of Encounter: 05/27/2020  Primary Cardiologist: Former Dr. Delton See, MD   Subjective   Feels good, no complaints. Still with significant LE edema   Inpatient Medications    Scheduled Meds: . aspirin EC  81 mg Oral Daily  . atorvastatin  80 mg Oral Daily  . clopidogrel  75 mg Oral Daily  . empagliflozin  10 mg Oral Daily  . furosemide  80 mg Intravenous Daily  . heparin  5,000 Units Subcutaneous Q8H  . insulin aspart  0-15 Units Subcutaneous TID WC  . insulin aspart  0-5 Units Subcutaneous QHS  . losartan  25 mg Oral Daily  . metoprolol succinate  12.5 mg Oral Daily  . sodium chloride flush  3 mL Intravenous Q12H  . spironolactone  25 mg Oral Daily   Continuous Infusions: . sodium chloride     PRN Meds: sodium chloride, acetaminophen, ondansetron (ZOFRAN) IV, sodium chloride flush   Vital Signs    Vitals:   05/26/20 1201 05/26/20 1649 05/26/20 2322 05/27/20 0500  BP: 101/78 (!) 93/59 (!) 88/60 110/71  Pulse: 95 67 89 85  Resp: 18 18 19 18   Temp: 98.4 F (36.9 C) 98.5 F (36.9 C) 99.1 F (37.3 C) 98.9 F (37.2 C)  TempSrc: Oral Oral Oral Oral  SpO2: 95% 98% 93% 99%  Weight:    73.5 kg  Height:        Intake/Output Summary (Last 24 hours) at 05/27/2020 0852 Last data filed at 05/27/2020 0505 Gross per 24 hour  Intake 243 ml  Output 3100 ml  Net -2857 ml   Filed Weights   05/25/20 0936 05/26/20 0339 05/27/20 0500  Weight: 81.4 kg 75 kg 73.5 kg    Physical Exam   General: Well developed, well nourished, NAD Neck: Negative for carotid bruits. No JVD Lungs:Diminished in bilateral lower lobes. Breathing is unlabored. Cardiovascular: RRR with S1 S2. No murmurs Abdomen: Soft, non-tender, non-distended. No obvious abdominal masses. Extremities: 2-3+ BLE edema. Radial pulses 2+ bilaterally Neuro: Alert and oriented. No focal deficits. No facial asymmetry. MAE spontaneously. Psych: Responds to questions  appropriately with normal affect.    Labs    Chemistry Recent Labs  Lab 05/24/20 1813 05/26/20 0242 05/27/20 0600  NA 138 137 140  K 4.1 4.0 3.4*  CL 103 102 103  CO2 27 27 28   GLUCOSE 244* 135* 113*  BUN 19 21 23   CREATININE 1.30* 1.47* 1.41*  CALCIUM 8.8* 8.5* 8.5*  GFRNONAA >60 53* 56*  ANIONGAP 8 8 9      Hematology Recent Labs  Lab 05/24/20 1813  WBC 7.8  RBC 3.02*  HGB 8.7*  HCT 27.8*  MCV 92.1  MCH 28.8  MCHC 31.3  RDW 14.6  PLT 408*    Cardiac EnzymesNo results for input(s): TROPONINI in the last 168 hours. No results for input(s): TROPIPOC in the last 168 hours.   BNP Recent Labs  Lab 05/24/20 1813  BNP 3,024.2*     DDimer No results for input(s): DDIMER in the last 168 hours.   Radiology    DG CHEST PORT 1 VIEW  Result Date: 05/26/2020 CLINICAL DATA:  Evaluate for pneumothorax and bilateral pleural effusions. EXAM: PORTABLE CHEST 1 VIEW COMPARISON:  05/25/2020; 05/24/2020; 05/16/2020 FINDINGS: Grossly unchanged cardiac silhouette and mediastinal contours post median sternotomy. Pulmonary vasculature remains indistinct with cephalization of flow. Grossly unchanged small layering bilateral effusions with associated bibasilar opacities favored to represent atelectasis.  No new focal airspace opacities. No pneumothorax. No acute osseous abnormalities. IMPRESSION: Grossly unchanged findings of pulmonary edema, small layering bilateral effusions and associated bibasilar atelectasis. Electronically Signed   By: Simonne Come M.D.   On: 05/26/2020 09:04   DG CHEST PORT 1 VIEW  Result Date: 05/25/2020 CLINICAL DATA:  65 year old male status post thoracentesis. EXAM: PORTABLE CHEST 1 VIEW COMPARISON:  Chest radiograph dated 05/24/2020. FINDINGS: No pneumothorax post thoracentesis. Interval decrease in the size of bilateral pleural effusions and improved aeration of the lungs compared to the prior radiograph. There are bibasilar atelectasis. Pneumonia is not excluded.  Stable cardiomediastinal silhouette. Median sternotomy wires and CABG vascular clips. No acute osseous pathology. IMPRESSION: Interval decrease in the size of bilateral pleural effusions and improved aeration of the lungs compared to the prior radiograph. No pneumothorax. Electronically Signed   By: Elgie Collard M.D.   On: 05/25/2020 15:42   ECHOCARDIOGRAM LIMITED  Result Date: 05/26/2020    ECHOCARDIOGRAM LIMITED REPORT   Patient Name:   Curtis Watkins Date of Exam: 05/26/2020 Medical Rec #:  [id number]   Height:       71.0 in Accession #:    [id number]  Weight:       165.3 lb Date of Birth:  1955-04-28  BSA:          1.945 m Patient Age:    64 years    BP:           101/78 mmHg Patient Gender: M           HR:           86 bpm. Exam Location:  Inpatient Procedure: 2D Echo and Intracardiac Opacification Agent Indications:    I50.9* Heart failure (unspecified)  History:        Patient has prior history of Echocardiogram examinations, most                 recent 03/28/2020. Prior CABG.  Sonographer:    Roosvelt Maser RDCS Referring Phys: [id number] CHRISTOPHER A WROBEL  Sonographer Comments: This was a limited echo to assess for worsening systolic function. IMPRESSIONS  1. Left ventricular ejection fraction, by estimation, is 25 to 30%. The left ventricle has severely decreased function. The left ventricle demonstrates global hypokinesis. The left ventricular internal cavity size was mildly dilated.  2. A small pericardial effusion is present. The pericardial effusion is localized near the right atrium. There is no evidence of cardiac tamponade. Comparison(s): The left ventricular function is worsened. FINDINGS  Left Ventricle: Left ventricular ejection fraction, by estimation, is 25 to 30%. The left ventricle has severely decreased function. The left ventricle demonstrates global hypokinesis. Definity contrast agent was given IV to delineate the left ventricular endocardial borders. The left ventricular internal cavity  size was mildly dilated. Pericardium: A small pericardial effusion is present. The pericardial effusion is localized near the right atrium. There is no evidence of cardiac tamponade. LEFT VENTRICLE PLAX 2D LVIDd:         5.10 cm LVIDs:         4.40 cm LV PW:         1.00 cm LV IVS:        1.10 cm  LV Volumes (MOD) LV vol d, MOD A2C: 174.0 ml LV vol d, MOD A4C: 151.0 ml LV vol s, MOD A2C: 134.0 ml LV vol s, MOD A4C: 128.0 ml LV SV MOD A2C:     40.0 ml LV SV MOD A4C:  151.0 ml LV SV MOD BP:      37.9 ml LEFT ATRIUM         Index LA diam:    4.00 cm 2.06 cm/m   AORTA Ao Root diam: 3.20 cm Donato Schultz MD Electronically signed by Donato Schultz MD Signature Date/Time: 05/26/2020/4:49:21 PM    Final    Telemetry    NSR with HR in the 80's - Personally Reviewed  ECG    No new tracing as of 05/27/20- Personally Reviewed  Cardiac Studies   Echo 03/28/20:  1. Left ventricular ejection fraction, by estimation, is 40 to 45%. The  left ventricle has mildly decreased function. There is mild left  ventricular hypertrophy. Left ventricular diastolic parameters are  consistent with Grade II diastolic dysfunction  (pseudonormalization). Elevated left atrial pressure.  2. Right ventricular systolic function is normal. The right ventricular  size is normal.  3. Left atrial size was mildly dilated.  4. Mild mitral valve regurgitation.  5. The inferior vena cava is dilated in size with <50% respiratory  variability, suggesting right atrial pressure of 15 mmHg.   Cardiac catheterization 03/29/2020   Diabetic with severe two-vessel disease including the LAD and complex disease in the circumflex.  Ischemic cardiomyopathy with LVEF 35 to 40%. LVEDP is less than 10 mmHg.  Left main is widely patent  Right coronary is widely patent but contains diffuse atherosclerosis without focal narrowing.  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  RECOMMENDATIONS:   Continue IV heparin  Continue to treat  heart failure  Watch kidney function T CTS evaluation to consider coronary artery bypass grafting with LIMA to the LAD. SVG to circumflex or arterial graft assuming that the left radial has flow..Diagnostic Dominance: Right    Patient Profile     65 y.o. 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  Assessment & Plan    1. Acute on chronic combined systolic and diastolic CHF: -Echo performed 05/30/54 with EF at 40-45% with G2DD>>prior EF at 35-40% -IV Lasix reordered yesterday despite Cr bump>>1.47>>>1.41 -Continues to have pitting LE edema  -Weight, 162lb today with an admission weight at 180lb  -I&O, net negative 2.6L -Continue with IV lasix 80mg  QD -Continue losartan>>follow Cr closely   2. CAD s/p CABGx4 04/03/20: -Denies chest pain or other anginal symptoms -Continue ASA, Plavix, statin, Toprol   -CABG 03/2020  3. CKD stage III: -Cr, 1.41 today>>>mildly improved from 1.47 yesterday  -Daily BMET   4. HTN: -Soft, 110/71>>88/60>>93/59 -Continue metoprolol, reduce losartan dose to 12.5  5. DM2: -HbA1c, 6.8 from 03/28/20 -SSI while inpatient  -Continue Jardiance   6. Hypokalemia: -K+, 3.3 today  -Will replace with 05/26/20 PO now   7. HLD: -Last LDL, 65 -Continue high intensity statin   Signed, NP-[facility] Pager: [phone] 05/27/2020, 8:52 AM     For questions or updates, please contact   Please consult [email or web address] for contact info under Cardiology/STEMI.

## 2020-05-27 NOTE — Progress Notes (Signed)
Heart Failure Stewardship Pharmacist Progress Note   PCP: Doran Stabler, NP PCP-Cardiologist: Tobias Alexander, MD    HPI:  65 yo M with PMH of CAD s/p CABG in Feb 2022, HFrEF, T2DM, PAD, and HTN. He presented to the ED on 05/24/20 with shortness of breath and LE edema. An ECHO was done on 05/26/20 and LVEF was 25-30% (down from 40-45% in Feb 2022).  Current HF Medications: Furosemide 80 mg IV daily Metoprolol XL 12.5 mg daily Losartan 12.5 mg daily Spironolactone 25 mg daily Jardiance 10 mg daily  Prior to admission HF Medications: Metoprolol tartrate 12.5 mg BID  Pertinent Lab Values: . Serum creatinine 1.41, BUN 23, Potassium 3.4, Sodium 140, BNP 3024.2  Vital Signs: . Weight: 162 lbs (admission weight: 179 lbs) . Blood pressure: 110/70s  . Heart rate: 80s  Medication Assistance / Insurance Benefits Check: Does the patient have prescription insurance?  No  Does the patient qualify for medication assistance through manufacturers or grants?   Yes . Eligible grants and/or patient assistance programs: Valla Leaver . Medication assistance applications in progress: none  . Medication assistance applications approved: none Approved medication assistance renewals will be completed by: pending  Outpatient Pharmacy:  Prior to admission outpatient pharmacy: [facility] Is the patient willing to use [facility] pharmacy at discharge? Yes Is the patient willing to transition their outpatient pharmacy to utilize a [facility] outpatient pharmacy?   Pending    Assessment: 1. Acute on chronic systolic CHF (EF 16-10%), due to ICM. NYHA class III symptoms. - Continue furosemide 80 mg IV daily. Potassium replaced. - Continue metoprolol XL 12.5 mg daily - Agree with starting losartan 12.5 mg daily while BP on the lower side - would like to optimize to Entresto once BP allows  - Continue spironolactone 25 mg daily - Continue Jardiance 10 mg daily   Plan: 1) Medication changes  recommended at this time: - Continue current regimen   2) Patient assistance: - No prescription insurance - will start enrolling in patient assistance applications once regimen is determined.   3)  Education  - To be completed prior to discharge  Sharen Hones, PharmD, BCPS Heart Failure Stewardship Pharmacist Phone [phone]

## 2020-05-28 ENCOUNTER — Telehealth: Payer: Self-pay

## 2020-05-28 ENCOUNTER — Other Ambulatory Visit: Payer: Self-pay | Admitting: Cardiology

## 2020-05-28 ENCOUNTER — Encounter (HOSPITAL_COMMUNITY): Payer: Self-pay | Admitting: Internal Medicine

## 2020-05-28 ENCOUNTER — Other Ambulatory Visit (HOSPITAL_COMMUNITY): Payer: Self-pay

## 2020-05-28 DIAGNOSIS — I130 Hypertensive heart and chronic kidney disease with heart failure and stage 1 through stage 4 chronic kidney disease, or unspecified chronic kidney disease: Secondary | ICD-10-CM

## 2020-05-28 LAB — GLUCOSE, CAPILLARY
Glucose-Capillary: 165 mg/dL — ABNORMAL HIGH (ref 70–99)
Glucose-Capillary: 183 mg/dL — ABNORMAL HIGH (ref 70–99)

## 2020-05-28 LAB — BASIC METABOLIC PANEL
Anion gap: 7 (ref 5–15)
BUN: 25 mg/dL — ABNORMAL HIGH (ref 8–23)
CO2: 30 mmol/L (ref 22–32)
Calcium: 8.3 mg/dL — ABNORMAL LOW (ref 8.9–10.3)
Chloride: 101 mmol/L (ref 98–111)
Creatinine, Ser: 1.51 mg/dL — ABNORMAL HIGH (ref 0.61–1.24)
GFR, Estimated: 51 mL/min — ABNORMAL LOW (ref >60)
Glucose, Bld: 191 mg/dL — ABNORMAL HIGH (ref 70–99)
Potassium: 3.9 mmol/L (ref 3.5–5.1)
Sodium: 138 mmol/L (ref 135–145)

## 2020-05-28 MED ORDER — LOSARTAN POTASSIUM 25 MG PO TABS
12.5000 mg | ORAL_TABLET | Freq: Every day | ORAL | 3 refills | Status: DC
  Filled 2020-05-28: qty 15, 30d supply, fill #0

## 2020-05-28 MED ORDER — FUROSEMIDE 40 MG PO TABS
40.0000 mg | ORAL_TABLET | Freq: Every day | ORAL | 3 refills | Status: DC
  Filled 2020-05-28: qty 30, 30d supply, fill #0

## 2020-05-28 MED ORDER — SPIRONOLACTONE 25 MG PO TABS
12.5000 mg | ORAL_TABLET | Freq: Every day | ORAL | 3 refills | Status: DC
  Filled 2020-05-28: qty 15, 30d supply, fill #0

## 2020-05-28 MED ORDER — EMPAGLIFLOZIN 10 MG PO TABS
10.0000 mg | ORAL_TABLET | Freq: Every day | ORAL | 3 refills | Status: DC
  Filled 2020-05-28: qty 14, 14d supply, fill #0

## 2020-05-28 MED ORDER — SPIRONOLACTONE 12.5 MG HALF TABLET: 12.5000 mg | ORAL_TABLET | Freq: Every day | ORAL | Status: DC

## 2020-05-28 MED ORDER — FUROSEMIDE 40 MG PO TABS
40.0000 mg | ORAL_TABLET | Freq: Every day | ORAL | Status: DC
  Administered 2020-05-28: 40 mg via ORAL
  Filled 2020-05-28: qty 1

## 2020-05-28 NOTE — Progress Notes (Signed)
Heart Failure Nurse Navigator Progress Note  PCP: Doran Stabler, NP PCP-Cardiologist: Nelson-appt with S.Weaver PA  Admission Diagnosis: CHF Admitted from: home with significant other  Presentation:   [name] presented with SOB and BLE edema. Pleasant man sitting on chair with pants and shoes on, "ready to go today". Recent CABG 03/2020, scar healing well.   ECHO/ LVEF: 25-30%  Clinical Course:  Past Medical History:  Diagnosis Date  . Carotid artery disease (HCC)    04/01/20: 60-79% RICA stenosis, 1-39% LICA stenosis  . CHF (congestive heart failure) (HCC)   . Coronary artery disease   . Diabetes mellitus without complication (HCC)   . Hypertension   . Ischemic cardiomyopathy   . PAD (peripheral artery disease) (HCC)    occluded right axillary artery 03/29/20      Social History   Socioeconomic History  . Marital status: Significant Other    Spouse name: Not on file  . Number of children: 3  . Years of education: Not on file  . Highest education level: High school graduate  Occupational History  . Occupation: retired d/t medical conditions  Tobacco Use  . Smoking status: Current Every Day Smoker    Packs/day: 0.50    Types: Cigarettes  . Smokeless tobacco: Never Used  Vaping Use  . Vaping Use: Never used  Substance and Sexual Activity  . Alcohol use: Not Currently    Alcohol/week: 1.0 standard drink    Types: 1 Cans of beer per week  . Drug use: Not Currently    Frequency: 3.0 times per week    Types: Marijuana  . Sexual activity: Not Currently  Other Topics Concern  . Not on file  Social History Narrative  . Not on file   Social Determinants of Health   Financial Resource Strain: High Risk  . Difficulty of Paying Living Expenses: Hard  Food Insecurity: No Food Insecurity  . Worried About Programme researcher, broadcasting/film/video in the Last Year: Never true  . Ran Out of Food in the Last Year: Never true  Transportation Needs: No Transportation Needs  . Lack of  Transportation (Medical): No  . Lack of Transportation (Non-Medical): No  Physical Activity: Inactive  . Days of Exercise per Week: 0 days  . Minutes of Exercise per Session: 0 min  Stress: Not on file  Social Connections: Not on file    High Risk Criteria for Readmission and/or Poor Patient Outcomes:  Heart failure hospital admissions (last 6 months): 1   No Show rate: 0%  Difficult social situation: yes  Demonstrates medication adherence: yes  Primary Language: English  Literacy level: able to read/write and comprehend  Education Assessment and Provision:  Detailed education and instructions provided on heart failure disease management including the following:  Signs and symptoms of Heart Failure When to call the physician Importance of daily weights Low sodium diet Fluid restriction Medication management Anticipated future follow-up appointments  Patient education given on each of the above topics.  Patient acknowledges understanding via teach back method and acceptance of all instructions.  Education Materials:  "Living Better With Heart Failure" Booklet, HF zone tool, & Daily Weight Tracker Tool.  Patient has scale at home: no, given from AHF clinic Patient has pill box at home: no, given from AHF clinic   Barriers of Care:   -no insurance -financial strain: unemployed since CABG. fixed income- SSI from spousal support ~$830/mo, applied for disability-pending. Food Stamps program ~$70/mo.  -medication cost  Considerations/Referrals:  Referral made to Heart Failure Pharmacist Stewardship: yes, appreciated Referral made to Heart & Vascular TOC clinic: yes, Tuesday, April 12 @ 3pm per pt request.  Items for Follow-up on DC/TOC: -medication optimization -medication cost -financial strain -insurance  Ozella Rocks, Charity fundraiser, BSN Heart Failure Nurse Navigator [phone]

## 2020-05-28 NOTE — Discharge Summary (Addendum)
Discharge Summary    Patient ID: Curtis Watkins MRN: [id number]; DOB: 1955/12/01  Admit date: 05/24/2020 Discharge date: 05/28/2020  PCP:  Doran Stabler, NP   [facility]  Cardiologist:  Tobias Alexander, MD (former) [phone]  Discharge Diagnoses    Principal Problem:   Acute on chronic systolic and diastolic heart failure, NYHA class 3 (HCC) Active Problems:   Diabetic foot ulcers (HCC)   DM2 (diabetes mellitus, type 2) (HCC)   HTN (hypertension)   S/P CABG x 4   Pleural effusion  Diagnostic Studies/Procedures    Limited echocardiogram 05/26/20:  1. Left ventricular ejection fraction, by estimation, is 25 to 30%. The  left ventricle has severely decreased function. The left ventricle  demonstrates global hypokinesis. The left ventricular internal cavity size  was mildly dilated.  2. A small pericardial effusion is present. The pericardial effusion is  localized near the right atrium. There is no evidence of cardiac  tamponade.   Comparison(s): The left ventricular function is worsened.  _____________   History of Present Illness     Curtis Watkins is a 65 y.o. male with with history of CAD status post CABG (February 2022), HFrEF, type 2 diabetes, PAD, hypertension who was admitted on 05/25/2020 with acute on chronic systolic heart failure.   [name] cardiac history began 03/2020 when he initially presented with shortness of breath and lower extremity swelling.  He had an echocardiogram performed, and was found to have a mid range ejection fraction with regional wall motion abnormalities.  He underwent left heart catheterization the following day, and was found to have severe two-vessel disease involving the LAD and circumflex.  He underwent four-vessel CABG on February 9.  His postoperative course was relatively uncomplicated and he was discharged on the 13th. In early March he underwent second and third toe amputation for a known diabetic foot  ulcer.  He has been following with Dr. Delton See and Dr. Vickey Sages in clinic.  He was noted to have a large left pleural effusion at his surgical follow-up last month, and underwent thoracentesis on the 24th with over 1-1/2 L of serosanguineous fluid removal.  Since his discharge in February continued to struggle with shortness of breath and lower extremity edema. There have been periods where he overall has felt reasonably well and has not been very limited by his shortness of breath.  His shortness of breath was certainly worse prior to his thoracentesis but did note significant improvement albeit short-lived.  Approximately one week prior to hospital presentation, he noticed significant and progressive swelling of his lower extremities>>not on PTA Lasix. Given his symptoms, he presented to the ED for further evaluation.   Hospital Course    On presentation to the emergency department he was afebrile and hemodynamically stable.  He was reportedly hypoxic and was started on 2 L nasal cannula.  His EKG did not have any signs of ischemia.  His BNP was highly elevated at over 3000.  He remains stably anemic.  His chest x-ray was notable for bilateral at least moderate pleural effusions. He has been treated with IV Lasix and he was continued on GDMT with Toprol, losartan, spironolactone, and Jardiance with the plan to transition to [facility] prior to discharge.   Pulmonary medicine was consulted due to moderate recurrence of pleural effusion to perform thoracentesis. This was performed 05/25/20 and yielded 1500cc's of bloody drainage. He tolerated the procedure well with post CXR stable with no pneumo.   He had  issues with asymptomatic hypotension precluding him from [facility] at this time. This can be re-addressed in the OP setting. He was seen by HF Stewardship pharmacy team and was approved for [employer] and Jardiance coverage.   Hospital problems include:  Acute on chronic systolic heart failure, EF 25-30%   -Hx of CABG x4 in 03/2020 with EF at 40-45% which has now dropped to 25-30%.  -He presented with acute volume overload and moderate pleural effusions>>s/p right thoracentesis.  -Weight, 158lb with admission weight at 180lb  -I&O, net negative 4.8L -Initially treated with IV Lasix 80 mg twice daily which was transitioned to oral dosing at 40mg  QD>>to be discharged  -Continue to optimize guideline directed medical therapy with metoprolol succinate 12.5 mg daily, losartan 12.5 mg daily, aldactone 12.5 mg daily and Jardiance.  -BPs remained too low to start Entresto   CAD status post CABG  -Recent four-vessel CABG with no angina.   -Continue aspirin and Plavix, high intensity statin.    Hypertension  -Soft BPs during hospital course. -Asymptomatic   Diabetes  -ContinueJardiance.   -HbA1c 6.8   Acute on chronic kidney disease  -Baseline CKD stage III.  Creatinine improving with diuresis>>Cr 1.51 on day of discharge -Will have him come to the office in one week for repeat BMET    Consultants: Pulmonary medicine   The patient was seen and examined by Dr. who feels that he is stable and ready for discharge today, 05/28/20  Did the patient have an acute coronary syndrome (MI, NSTEMI, STEMI, etc) this admission?:  No                               Did the patient have a percutaneous coronary intervention (stent / angioplasty)?:  No.    ____________  Discharge Vitals Blood pressure 93/65, pulse 77, temperature 98.1 F (36.7 C), temperature source Oral, resp. rate 16, height 5\' 11"  (1.803 m), weight 71.7 kg, SpO2 100 %.  Filed Weights   05/26/20 0339 05/27/20 0500 05/28/20 0519  Weight: 75 kg 73.5 kg 71.7 kg    Labs & Radiologic Studies    CBC No results for input(s): WBC, NEUTROABS, HGB, HCT, MCV, PLT in the last 72 hours. Basic Metabolic Panel Recent Labs    07/27/20 0600 05/28/20 0243  NA 140 138  K 3.4* 3.9  CL 103 101  CO2 28 30  GLUCOSE 113* 191*  BUN 23 25*   CREATININE 1.41* 1.51*  CALCIUM 8.5* 8.3*   Liver Function Tests No results for input(s): AST, ALT, ALKPHOS, BILITOT, PROT, ALBUMIN in the last 72 hours. No results for input(s): LIPASE, AMYLASE in the last 72 hours. High Sensitivity Troponin:   No results for input(s): TROPONINIHS in the last 720 hours.  BNP Invalid input(s): POCBNP D-Dimer No results for input(s): DDIMER in the last 72 hours. Hemoglobin A1C No results for input(s): HGBA1C in the last 72 hours. Fasting Lipid Panel No results for input(s): CHOL, HDL, LDLCALC, TRIG, CHOLHDL, LDLDIRECT in the last 72 hours. Thyroid Function Tests No results for input(s): TSH, T4TOTAL, T3FREE, THYROIDAB in the last 72 hours.  Invalid input(s): FREET3 _____________  DG Chest 1 View  Result Date: 05/16/2020 CLINICAL DATA:  Status post thoracentesis EXAM: CHEST  1 VIEW COMPARISON:  May 03, 2020 FINDINGS: No pneumothorax. There is residual pleural fluid on the left with left base atelectasis. There is a somewhat larger pleural effusion on the right with right  base atelectasis. The lungs elsewhere are clear. Heart is upper normal in size with pulmonary vascularity normal. No adenopathy. Patient is status post coronary artery bypass grafting. There is aortic atherosclerosis. No adenopathy. No bone lesions IMPRESSION: No pneumothorax. There are pleural effusions present bilaterally, currently larger on the right than on the left, with bibasilar atelectasis. Lungs elsewhere clear. Stable cardiac silhouette. Aortic Atherosclerosis (ICD10-I70.0). Electronically Signed   By: Bretta Bang III M.D.   On: 05/16/2020 14:47   DG Chest 1 View  Result Date: 05/03/2020 CLINICAL DATA:  Status post right thoracentesis today. EXAM: CHEST  1 VIEW COMPARISON:  Single-view of the chest 04/22/2020. FINDINGS: Right pleural effusion is decreased after thoracentesis. No pneumothorax. Left effusion and basilar airspace disease are unchanged. Cardiac silhouette is  obscured. IMPRESSION: Negative for pneumothorax after right thoracentesis. Right pleural effusion is decreased. No change in a left effusion and basilar airspace disease. Electronically Signed   By: Drusilla Kanner M.D.   On: 05/03/2020 14:25   DG Chest 2 View  Result Date: 05/24/2020 CLINICAL DATA:  Worsening short of breath EXAM: CHEST - 2 VIEW COMPARISON:  05/16/2020, 05/03/2020 FINDINGS: Post sternotomy changes. Moderate bilateral pleural effusions slightly increased on the left side. Basilar airspace disease. Obscured cardiomediastinal silhouette. Vascular congestion. No pneumothorax. IMPRESSION: Moderate bilateral pleural effusions with bibasilar airspace disease. Effusion on the left appears increased in size. Vascular congestion. Electronically Signed   By: Jasmine Pang M.D.   On: 05/24/2020 18:58   DG CHEST PORT 1 VIEW  Result Date: 05/26/2020 CLINICAL DATA:  Evaluate for pneumothorax and bilateral pleural effusions. EXAM: PORTABLE CHEST 1 VIEW COMPARISON:  05/25/2020; 05/24/2020; 05/16/2020 FINDINGS: Grossly unchanged cardiac silhouette and mediastinal contours post median sternotomy. Pulmonary vasculature remains indistinct with cephalization of flow. Grossly unchanged small layering bilateral effusions with associated bibasilar opacities favored to represent atelectasis. No new focal airspace opacities. No pneumothorax. No acute osseous abnormalities. IMPRESSION: Grossly unchanged findings of pulmonary edema, small layering bilateral effusions and associated bibasilar atelectasis. Electronically Signed   By: Simonne Come M.D.   On: 05/26/2020 09:04   DG CHEST PORT 1 VIEW  Result Date: 05/25/2020 CLINICAL DATA:  65 year old male status post thoracentesis. EXAM: PORTABLE CHEST 1 VIEW COMPARISON:  Chest radiograph dated 05/24/2020. FINDINGS: No pneumothorax post thoracentesis. Interval decrease in the size of bilateral pleural effusions and improved aeration of the lungs compared to the prior  radiograph. There are bibasilar atelectasis. Pneumonia is not excluded. Stable cardiomediastinal silhouette. Median sternotomy wires and CABG vascular clips. No acute osseous pathology. IMPRESSION: Interval decrease in the size of bilateral pleural effusions and improved aeration of the lungs compared to the prior radiograph. No pneumothorax. Electronically Signed   By: Elgie Collard M.D.   On: 05/25/2020 15:42   ECHOCARDIOGRAM LIMITED  Result Date: 05/26/2020    ECHOCARDIOGRAM LIMITED REPORT   Patient Name:   VICTORIA EUCEDA Date of Exam: 05/26/2020 Medical Rec #:  [id number]   Height:       71.0 in Accession #:    [id number]  Weight:       165.3 lb Date of Birth:  [birth year]  BSA:          1.945 m Patient Age:    64 years    BP:           101/78 mmHg Patient Gender: M           HR:           86 bpm. Exam Location:  Inpatient Procedure: 2D Echo and Intracardiac Opacification Agent Indications:    I50.9* Heart failure (unspecified)  History:        Patient has prior history of Echocardiogram examinations, most                 recent 03/28/2020. Prior CABG.  Sonographer:    Roosvelt Maser RDCS Referring Phys: [id number] CHRISTOPHER A WROBEL  Sonographer Comments: This was a limited echo to assess for worsening systolic function. IMPRESSIONS  1. Left ventricular ejection fraction, by estimation, is 25 to 30%. The left ventricle has severely decreased function. The left ventricle demonstrates global hypokinesis. The left ventricular internal cavity size was mildly dilated.  2. A small pericardial effusion is present. The pericardial effusion is localized near the right atrium. There is no evidence of cardiac tamponade. Comparison(s): The left ventricular function is worsened. FINDINGS  Left Ventricle: Left ventricular ejection fraction, by estimation, is 25 to 30%. The left ventricle has severely decreased function. The left ventricle demonstrates global hypokinesis. Definity contrast agent was given IV to delineate the left  ventricular endocardial borders. The left ventricular internal cavity size was mildly dilated. Pericardium: A small pericardial effusion is present. The pericardial effusion is localized near the right atrium. There is no evidence of cardiac tamponade. LEFT VENTRICLE PLAX 2D LVIDd:         5.10 cm LVIDs:         4.40 cm LV PW:         1.00 cm LV IVS:        1.10 cm  LV Volumes (MOD) LV vol d, MOD A2C: 174.0 ml LV vol d, MOD A4C: 151.0 ml LV vol s, MOD A2C: 134.0 ml LV vol s, MOD A4C: 128.0 ml LV SV MOD A2C:     40.0 ml LV SV MOD A4C:     151.0 ml LV SV MOD BP:      37.9 ml LEFT ATRIUM         Index LA diam:    4.00 cm 2.06 cm/m   AORTA Ao Root diam: 3.20 cm Donato Schultz MD Electronically signed by Donato Schultz MD Signature Date/Time: 05/26/2020/4:49:21 PM    Final    IR THORACENTESIS ASP PLEURAL SPACE W/IMG GUIDE  Result Date: 05/16/2020 INDICATION: Recent history of coronary bypass surgery. Large left pleural effusion. Request for therapeutic thoracentesis. EXAM: ULTRASOUND GUIDED LEFT THORACENTESIS MEDICATIONS: 1% plain lidocaine, 5 mL COMPLICATIONS: None PROCEDURE: An ultrasound guided thoracentesis was thoroughly discussed with the patient and questions answered. The benefits, risks, alternatives and complications were also discussed. The patient understands and wishes to proceed with the procedure. Written consent was obtained. Ultrasound was performed to localize and mark an adequate pocket of fluid in the left chest. The area was then prepped and draped in the normal sterile fashion. 1% Lidocaine was used for local anesthesia. Under ultrasound guidance a 6 Fr Safe-T-Centesis catheter was introduced. Thoracentesis was performed. The catheter was removed and a dressing applied. FINDINGS: A total of approximately 1650 mL of serosanguineous fluid was removed. IMPRESSION: Successful ultrasound guided left thoracentesis yielding 1650 mL of pleural fluid. Read by: Brayton El PA-C Electronically Signed   By:  Gilmer Mor D.O.   On: 05/16/2020 14:44   IR THORACENTESIS ASP PLEURAL SPACE W/IMG GUIDE  Result Date: 05/03/2020 INDICATION: Patient with history of CABG x4 04/03/2020, dyspnea, and bilateral pleural effusions, R>L. Request is made for therapeutic right thoracentesis. EXAM: ULTRASOUND GUIDED THERAPEUTIC RIGHT THORACENTESIS MEDICATIONS: 10 mL 1%  lidocaine COMPLICATIONS: None immediate. PROCEDURE: An ultrasound guided thoracentesis was thoroughly discussed with the patient and questions answered. The benefits, risks, alternatives and complications were also discussed. The patient understands and wishes to proceed with the procedure. Written consent was obtained. Ultrasound was performed to localize and mark an adequate pocket of fluid in the right chest. The area was then prepped and draped in the normal sterile fashion. 1% Lidocaine was used for local anesthesia. Under ultrasound guidance a 6 Fr Safe-T-Centesis catheter was introduced. Thoracentesis was performed. The catheter was removed and a dressing applied. FINDINGS: A total of approximately 2 L of dark red fluid was removed. Procedure was stopped after 2 L secondary IR protocol as this is patient's first thoracentesis. IMPRESSION: Successful ultrasound guided right thoracentesis yielding 2 L of pleural fluid. Read by: Elwin Mocha, PA-C Electronically Signed   By: Simonne Come M.D.   On: 05/03/2020 14:59   Disposition   Pt is being discharged home today in good condition.  Follow-up Plans & Appointments    Follow-up Information    Beatrice Lecher, PA-C Follow up on 07/02/2020.   Specialties: Cardiology, Physician Assistant Why: at 3:45pm  Contact information: [address] [place] [address] [phone]              Discharge Instructions    Call MD for:  difficulty breathing, headache or visual disturbances   Complete by: As directed    Call MD for:  extreme fatigue   Complete by: As directed    Call MD  for:  hives   Complete by: As directed    Call MD for:  persistant dizziness or light-headedness   Complete by: As directed    Call MD for:  persistant nausea and vomiting   Complete by: As directed    Call MD for:  redness, tenderness, or signs of infection (pain, swelling, redness, odor or green/yellow discharge around incision site)   Complete by: As directed    Call MD for:  severe uncontrolled pain   Complete by: As directed    Call MD for:  temperature >100.4   Complete by: As directed    Diet - low sodium heart healthy   Complete by: As directed    Discharge instructions   Complete by: As directed    One of your heart tests showed weakness of the heart muscle this admission. This may make you more susceptible to weight gain from fluid retention, which can lead to symptoms that we call heart failure. Please follow these special instructions:  1. Follow a low-salt diet - you are allowed no more than 2,000mg  of sodium per day. Watch your fluid intake. In general, you should not be taking in more than 2 liters of fluid per day (no more than 8 glasses per day). This includes sources of water in foods like soup, coffee, tea, milk, etc. 2. Weigh yourself on the same scale at same time of day and keep a log. 3. Call your doctor: (Anytime you feel any of the following symptoms)  - 3lb weight gain overnight or 5lb within a few days - Shortness of breath, with or without a dry hacking cough  - Swelling in the hands, feet or stomach  - If you have to sleep on extra pillows at night in order to breathe   IT IS IMPORTANT TO LET YOUR DOCTOR KNOW EARLY ON IF YOU ARE HAVING SYMPTOMS SO WE CAN HELP YOU!    You  will need to come to the office to repeat labs in one week. The office will call you to set this up.   Increase activity slowly   Complete by: As directed      Discharge Medications   Allergies as of 05/28/2020   No Known Allergies     Medication List    STOP taking these  medications   metFORMIN 1000 MG tablet Commonly known as: Glucophage     TAKE these medications   aspirin EC 81 MG tablet Take 1 tablet (81 mg total) by mouth daily.   atorvastatin 80 MG tablet Commonly known as: LIPITOR Take 1 tablet (80 mg total) by mouth daily.   brimonidine 0.2 % ophthalmic solution Commonly known as: ALPHAGAN Place 1 drop into the right eye 3 (three) times daily.   clopidogrel 75 MG tablet Commonly known as: Plavix Take 1 tablet (75 mg total) by mouth daily.   empagliflozin 10 MG Tabs tablet Commonly known as: JARDIANCE Take 1 tablet (10 mg total) by mouth daily. Start taking on: May 29, 2020   furosemide 40 MG tablet Commonly known as: LASIX Take 1 tablet (40 mg total) by mouth daily. Start taking on: May 29, 2020   hydrocerin Crea Apply 113 application topically daily.   losartan 25 MG tablet Commonly known as: COZAAR Take 0.5 tablets (12.5 mg total) by mouth daily.   metoprolol tartrate 25 MG tablet Commonly known as: LOPRESSOR Take 0.5 tablets (12.5 mg total) by mouth 2 (two) times daily.   OVER THE COUNTER MEDICATION Apply 1 application topically daily. Diabetic lotion   oxyCODONE-acetaminophen 5-325 MG tablet Commonly known as: Percocet Take 1 tablet by mouth every 4 (four) hours as needed. What changed: reasons to take this   spironolactone 25 MG tablet Commonly known as: ALDACTONE Take 0.5 tablets (12.5 mg total) by mouth daily. Start taking on: May 29, 2020       Outstanding Labs/Studies   BMET in one week   Duration of Discharge Encounter   Greater than 30 minutes including physician time.  Signed, Georgie ChardJill Sayana Salley, NP 05/28/2020, 11:00 AM

## 2020-05-28 NOTE — Progress Notes (Addendum)
Heart Failure Stewardship Pharmacist Progress Note   PCP: Doran Stabler, NP PCP-Cardiologist: Tobias Alexander, MD    HPI:  65 yo M with PMH of CAD s/p CABG in Feb 2022, HFrEF, T2DM, PAD, and HTN. He presented to the ED on 05/24/20 with shortness of breath and LE edema. An ECHO was done on 05/26/20 and LVEF was 25-30% (down from 40-45% in Feb 2022).  Current HF Medications: Furosemide 40 mg PO daily Metoprolol XL 12.5 mg daily Losartan 12.5 mg daily Spironolactone 25 mg daily Jardiance 10 mg daily  Prior to admission HF Medications: Metoprolol tartrate 12.5 mg BID  Pertinent Lab Values: . Serum creatinine 1.51, BUN 25, Potassium 3.9, Sodium 138, BNP 3024.2  Vital Signs: . Weight: 158 lbs (admission weight: 179 lbs) . Blood pressure: 90-100/70s  . Heart rate: 80s  Medication Assistance / Insurance Benefits Check: Does the patient have prescription insurance?  No  Does the patient qualify for medication assistance through manufacturers or grants?   Yes . Eligible grants and/or patient assistance programs: Valla Leaver . Medication assistance applications in progress: none  . Medication assistance applications approved: none Approved medication assistance renewals will be completed by: pending  Outpatient Pharmacy:  [facility] Is the patient willing to use [facility] pharmacy at discharge? Yes Is the patient willing to transition their outpatient pharmacy to utilize a [facility] outpatient pharmacy?   Pending    Assessment: 1. Acute on chronic systolic CHF (EF 78-67%), due to ICM. NYHA class III symptoms. - Agree with transitioning to furosemide 40 mg daily - Continue metoprolol XL 12.5 mg daily - Continue losartan 12.5 mg daily while BP on the lower side - would like to optimize to Entresto once BP allows as outpatient - Continue spironolactone 25 mg daily - Continue Jardiance 10 mg daily   Plan: 1) Medication changes  recommended at this time: - Continue current regimen   2) Patient assistance: - No prescription insurance - Will work on patient assistance applications in HF [facility] clinic - Can utilize free 14-day Jardiance card at [facility] pharmacy  Sharen Hones, PharmD, BCPS Heart Failure Stewardship Pharmacist Phone [phone]

## 2020-05-28 NOTE — Telephone Encounter (Signed)
Patient is having lab work done on his visit [facility].

## 2020-05-28 NOTE — Progress Notes (Addendum)
AVS reviewed with patient. All questions answered at this time. Transportation provided by family.

## 2020-05-28 NOTE — Plan of Care (Signed)

## 2020-05-28 NOTE — Telephone Encounter (Signed)
Left message for patient to call back  

## 2020-05-28 NOTE — Telephone Encounter (Signed)
-----   Message from Filbert Schilder, NP sent at 05/28/2020 11:01 AM EDT ----- Regarding: labs This patient will be discharged today and will need follow up BMET in one week. The order has been placed, can someone help with scheduling a time for him to come in?   Thank you  Noreene Larsson

## 2020-05-31 ENCOUNTER — Other Ambulatory Visit: Payer: Self-pay

## 2020-05-31 ENCOUNTER — Telehealth: Payer: Self-pay | Admitting: Cardiology

## 2020-05-31 MED ORDER — CLOPIDOGREL BISULFATE 75 MG PO TABS: 75.0000 mg | ORAL_TABLET | Freq: Every day | ORAL | 3 refills | Status: DC

## 2020-05-31 NOTE — Telephone Encounter (Signed)
*  STAT* If patient is at the pharmacy, call can be transferred to refill team.   1. Which medications need to be refilled? (please list name of each medication and dose if known)-  clopidogrel (PLAVIX) 75 MG tablet     2. Which pharmacy/location (including street and city if local pharmacy) is medication to be sent to?[id number] - [place], [place] - [address]  3. Do they need a 30 day or 90 day supply? 30 day

## 2020-06-04 ENCOUNTER — Encounter (HOSPITAL_COMMUNITY): Payer: Self-pay

## 2020-06-04 ENCOUNTER — Other Ambulatory Visit: Payer: Self-pay

## 2020-06-04 ENCOUNTER — Ambulatory Visit (HOSPITAL_COMMUNITY)
Admit: 2020-06-04 | Discharge: 2020-06-04 | Disposition: A | Discharge status: Home / Self Care | Payer: Self-pay | Source: Ambulatory Visit | Attending: Internal Medicine | Admitting: Internal Medicine

## 2020-06-04 ENCOUNTER — Telehealth (HOSPITAL_COMMUNITY): Payer: Self-pay

## 2020-06-04 ENCOUNTER — Other Ambulatory Visit (HOSPITAL_COMMUNITY): Payer: Self-pay

## 2020-06-04 ENCOUNTER — Ambulatory Visit (HOSPITAL_COMMUNITY)
Admission: RE | Admit: 2020-06-04 | Discharge: 2020-06-04 | Disposition: A | Discharge status: Home / Self Care | Payer: Self-pay | Source: Ambulatory Visit | Attending: Internal Medicine | Admitting: Internal Medicine

## 2020-06-04 VITALS — BP 108/82 | HR 82 | Wt 160.0 lb

## 2020-06-04 DIAGNOSIS — Z951 Presence of aortocoronary bypass graft: Secondary | ICD-10-CM | POA: Insufficient documentation

## 2020-06-04 DIAGNOSIS — R42 Dizziness and giddiness: Secondary | ICD-10-CM | POA: Insufficient documentation

## 2020-06-04 DIAGNOSIS — I2510 Atherosclerotic heart disease of native coronary artery without angina pectoris: Secondary | ICD-10-CM | POA: Insufficient documentation

## 2020-06-04 DIAGNOSIS — J90 Pleural effusion, not elsewhere classified: Secondary | ICD-10-CM | POA: Insufficient documentation

## 2020-06-04 DIAGNOSIS — F17210 Nicotine dependence, cigarettes, uncomplicated: Secondary | ICD-10-CM | POA: Insufficient documentation

## 2020-06-04 DIAGNOSIS — R0602 Shortness of breath: Secondary | ICD-10-CM | POA: Insufficient documentation

## 2020-06-04 DIAGNOSIS — I255 Ischemic cardiomyopathy: Secondary | ICD-10-CM | POA: Insufficient documentation

## 2020-06-04 DIAGNOSIS — N1831 Chronic kidney disease, stage 3a: Secondary | ICD-10-CM | POA: Insufficient documentation

## 2020-06-04 DIAGNOSIS — M6289 Other specified disorders of muscle: Secondary | ICD-10-CM | POA: Insufficient documentation

## 2020-06-04 DIAGNOSIS — Z79899 Other long term (current) drug therapy: Secondary | ICD-10-CM | POA: Insufficient documentation

## 2020-06-04 DIAGNOSIS — Z7902 Long term (current) use of antithrombotics/antiplatelets: Secondary | ICD-10-CM | POA: Insufficient documentation

## 2020-06-04 DIAGNOSIS — Z7901 Long term (current) use of anticoagulants: Secondary | ICD-10-CM | POA: Insufficient documentation

## 2020-06-04 DIAGNOSIS — I10 Essential (primary) hypertension: Secondary | ICD-10-CM

## 2020-06-04 DIAGNOSIS — Z7982 Long term (current) use of aspirin: Secondary | ICD-10-CM | POA: Insufficient documentation

## 2020-06-04 DIAGNOSIS — I5042 Chronic combined systolic (congestive) and diastolic (congestive) heart failure: Secondary | ICD-10-CM | POA: Insufficient documentation

## 2020-06-04 DIAGNOSIS — E1122 Type 2 diabetes mellitus with diabetic chronic kidney disease: Secondary | ICD-10-CM | POA: Insufficient documentation

## 2020-06-04 DIAGNOSIS — Z7984 Long term (current) use of oral hypoglycemic drugs: Secondary | ICD-10-CM | POA: Insufficient documentation

## 2020-06-04 DIAGNOSIS — E1151 Type 2 diabetes mellitus with diabetic peripheral angiopathy without gangrene: Secondary | ICD-10-CM | POA: Insufficient documentation

## 2020-06-04 DIAGNOSIS — E1100 Type 2 diabetes mellitus with hyperosmolarity without nonketotic hyperglycemic-hyperosmolar coma (NKHHC): Secondary | ICD-10-CM

## 2020-06-04 DIAGNOSIS — I130 Hypertensive heart and chronic kidney disease with heart failure and stage 1 through stage 4 chronic kidney disease, or unspecified chronic kidney disease: Secondary | ICD-10-CM | POA: Insufficient documentation

## 2020-06-04 MED ORDER — METOPROLOL SUCCINATE ER 25 MG PO TB24
25.0000 mg | ORAL_TABLET | Freq: Every day | ORAL | 0 refills | Status: DC
Start: 2020-06-04 — End: 2020-06-19

## 2020-06-04 NOTE — Progress Notes (Addendum)
[facility] Transitions of Care Clinic Heart Failure Pharmacist Encounter  HPI:  65 yo M with PMH of CAD s/p CABG in Feb 2022, HFrEF, T2DM, PAD, and HTN. He presented to the ED on 05/24/20 with shortness of breath and LE edema. An ECHO was done on 05/26/20 and LVEF was 25-30% (down from 40-45% in Feb 2022). He was then discharged on 05/28/20.  Today, Curtis Watkins presents to the Heart Failure TOC Clinic for follow up. He denies having shortness of breath or DOE, orthopnea/PND or edema. He had an episode of lightheadedness once today. He has been checking his weights at home and reports these have been stable. He has also been checking his BP at home and reports similar readings to clinic reading today. He has been following a fluid-restricted and sodium-restricted diet at home and reports he has been taking all medications as prescribed (uses a pill box).  HF Medications: Furosemide 40 mg daily Metoprolol tartrate 12.5 mg BID (was not discharged on metoprolol XL that he was optimized to during hospitalization) Losartan 12.5 mg daily Spironolactone 12.5 mg daily Jardiance 10 mg daily  Has the patient been experiencing any side effects to the medications prescribed?  no  Does the patient have any problems obtaining medications due to transportation or finances?   yes  Understanding of regimen: good Understanding of indications: good Potential of compliance: good Patient understands to avoid NSAIDs. Patient understands to avoid decongestants.   Pertinent Lab Values: . Labs attempted on 06/04/20 but unable to collect adequate sample . From 05/28/20: Serum creatinine 1.51, BUN 25, Potassium 3.9, Sodium 138,   Vital Signs: . Weight: 160 lbs (discharge weight: 158 lbs) . Blood pressure: 108/82 mmHg  . Heart rate: 82 bpm   Medication Assistance / Insurance Benefits Check: Does the patient have prescription insurance?  No  Outpatient Pharmacy:  [facility] pharmacy used to supply discharge medications? yes  If TOC pharmacy was used, were the refills transferred out to current pharmacy yet? no - will message [facility] PharmD to initiate transfers Is the patient willing to transition their outpatient pharmacy to utilize a [facility] outpatient pharmacy with or without mail order?   No - [facility]  Assessment: 1) Chronic systolic CHF (EF 26-71%), due to ICM. NYHA class II symptoms. - Reduce furosemide to 20 mg daily - Change metoprolol tartrate to metoprolol XL 25 mg daily - Continue losartan 12.5 mg daily - Continue spironolactone 12.5 mg daily - Continue Jardiance 10 mg daily  Plan: 1) Medication changes: - Reduce furosemide to 20 mg daily - Change metoprolol tartrate to metoprolol XL 25 mg daily  2) Patient Assistance: - Jardiance patient assistance application initiated. Patient portion has been completed and signed. Will fax to [facility] office (he was seeing Dr. Delton See) to complete and sign provider portion. Completed application should be faxed to [phone]. Patient sees Tereso Newcomer in May - he can be the provider listed on the application. - 2 weeks of Jardiance samples provided in clinic today - he still has just shy of a week supply left from discharge   3) Follow up: - Next appointment with HF TOC clinic in 2 weeks  Sharen Hones, PharmD, BCPS Heart Failure Transitions of Care Clinic Pharmacist [phone]

## 2020-06-04 NOTE — Patient Instructions (Signed)
STOP Metoprolol Tartrate  START Metoprolol Succinate 25 mg Daily  Labs done today, we will call you for abnormal results  A chest x-ray takes a picture of the organs and structures inside the chest, including the heart, lungs, and blood vessels. This test can show several things, including, whether the heart is enlarges; whether fluid is building up in the lungs; and whether pacemaker / defibrillator leads are still in place.  You have been referred to follow up with Dr Vickey Sages, his office will call you for an appointment  Thank you for allowing [place] to provider your heart failure care after your recent hospitalization. Please follow-up with [place] again in 2 weeks   If you have any questions, issues, or concerns before your next appointment please call our office at [phone], opt. 2 and leave a message for the triage nurse.

## 2020-06-04 NOTE — Progress Notes (Addendum)
[facility] Transitions of Care Clinic  PCP: Mikki Santee Primary Cardiologist: Tobias Alexander    HPI:  Curtis Watkins is a 65 y.o.  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 He had an echocardiogram performed, and was found to have a mid range ejection fraction with regional wall motion abnormalities. He underwent left heart catheterization the following day, and was found to have severe two-vessel disease involving the LAD and circumflex. He underwent four-vessel CABG on February 9.His postoperative course was relatively uncomplicated and he was discharged on the 13th. In early March he underwent second and third toe amputation for a known diabetic foot ulcer. He has been following with Dr. Delton Watkins and Dr. Vickey Watkins in clinic. He was noted to have a large left pleural effusion at his surgical follow-up last month, and underwent thoracentesis on the 24th with over 1-1/2 L of serosanguineous fluid removal.   Continued to struggle with worsening shortness of breath and volume overload in the outpatient setting.  This led to his admission on 05/24/2020.  During admission he was diuresed 20lbs to 158 with improvement in symptoms.  He also underwent repeat R thoracentesis with 1500cc removed. His GDMT was optimized and he was discharged on losartan 12.5mg , metoprolol succinate 12.5mg , spiro 12.5mg , and jardiance 10mg  and lasix 40mg .  Cr increased slightly during admission 1.30>1.55.    Felt dizzy and lightheaded on his way to the appointment today.  This is the second time he's had this happen since discharge.  Says his bp stays low 110/70's or so since leaving the hospital.  Breathing has been doing much better  since discharge, he is more limited now by muscle fatigue can walk around stores doing his laundry, checking the mail, doing ADL's.  Still smoking 1/2 ppd.  Weight has been stable at home 160lbs daily.     ROS: All systems negative except as listed in HPI, PMH and Problem List.  SH:  Social History   Socioeconomic History  . Marital status: Significant Other    Spouse name: Not on file  . Number of children: 3  . Years of education: Not on file  . Highest education level: High school graduate  Occupational History  . Occupation: retired d/t medical conditions  Tobacco Use  . Smoking status: Current Every Day Smoker    Packs/day: 0.50    Types: Cigarettes  . Smokeless tobacco: Never Used  Vaping Use  . Vaping Use: Never used  Substance and Sexual Activity  . Alcohol use: Not Currently    Alcohol/week: 1.0 standard drink    Types: 1 Cans of beer per week  . Drug use: Not Currently    Frequency: 3.0 times per week    Types: Marijuana  . Sexual activity: Not Currently  Other Topics Concern  . Not on file  Social History Narrative  . Not on file   Social Determinants of Health   Financial Resource Strain: High Risk  . Difficulty of Paying Living Expenses: Hard  Food Insecurity: No Food Insecurity  . Worried About in the Last Year: Never true  . Ran Out of Food in the Last Year: Never true  Transportation Needs: No Transportation Needs  . Lack of Transportation (Medical): No  .  Lack of Transportation (Non-Medical): No  Physical Activity: Inactive  . Days of Exercise per Week: 0 days  . Minutes of Exercise per Session: 0 min  Stress: Not on file  Social Connections: Not on file  Intimate Partner Violence: Not on file    FH: No family history on file.  Past Medical History:  Diagnosis Date  . Carotid artery disease (HCC)    04/01/20: 60-79% RICA stenosis, 1-39% LICA stenosis  . CHF (congestive heart failure) (HCC)   . Coronary artery disease   .  Diabetes mellitus without complication (HCC)   . Hypertension   . Ischemic cardiomyopathy   . PAD (peripheral artery disease) (HCC)    occluded right axillary artery 03/29/20     Current Outpatient Medications  Medication Sig Dispense Refill  . aspirin 81 MG tablet Take 1 tablet (81 mg total) by mouth daily.    Marland Kitchen atorvastatin (LIPITOR) 80 MG tablet Take 1 tablet (80 mg total) by mouth daily. 90 tablet 3  . brimonidine (ALPHAGAN) 0.2 % ophthalmic solution Place 1 drop into the right eye 3 (three) times daily.    . empagliflozin (JARDIANCE) 10 MG TABS tablet Take 1 tablet (10 mg total) by mouth daily. 90 tablet 3  . furosemide (LASIX) 40 MG tablet Take 1 tablet (40 mg total) by mouth daily. 90 tablet 3  . hydrocerin (EUCERIN) CREA Apply 113 application topically daily. 113 g 0  . losartan (COZAAR) 25 MG tablet Take 1/2 tablet (12.5 mg total) by mouth daily. 90 tablet 3  . metoprolol succinate (TOPROL XL) 25 MG 24 hr tablet Take 1 tablet (25 mg total) by mouth daily. 30 tablet 0  . OVER THE COUNTER MEDICATION Apply 1 application topically daily. Diabetic lotion    . spironolactone (ALDACTONE) 25 MG tablet Take 1/2 tablet (12.5 mg total) by mouth daily. 90 tablet 3  . clopidogrel (PLAVIX) 75 MG tablet Take 1 tablet (75 mg total) by mouth daily. (Patient not taking: Reported on 06/04/2020) 90 tablet 3  . oxyCODONE-acetaminophen (PERCOCET) 5-325 MG tablet Take 1 tablet by mouth every 4 (four) hours as needed. (Patient not taking: Reported on 06/04/2020) 30 tablet 0   No current facility-administered medications for this encounter.    Vitals:   06/04/20 1525  BP: 108/82  Pulse: 82  SpO2: 97%  Weight: 72.6 kg (160 lb)    PHYSICAL EXAM: Cardiac: JVD flat, normal rate and rhythm, clear s1 and s2, no murmurs, rubs or gallops, trace-1+ LE edema bilaterally Pulmonary: decreased breath sounds in bases, not in distress Abdominal: non distended abdomen, soft and nontender Psych: Alert, conversant,  in good spirits  POCUS: EF has improved back to 40-45%, IVC collapsible with >50% respiratory variability, Bilateral Pleural effusions persist R>L  ASSESSMENT & PLAN: Chronic systolic heart failure, Ischemic Cardiomyopathy: -Hx of CABG x4 in 03/2020 with EF at 40-45% on 05/2020 dropped to 25-30%. -requiring recurrent thoracentesis procedures R and L   -NYHA class II symptoms, euvolemic to slightly dry on exam -Switch metoprolol tartrate to succinate 25mg , decrease lasix to 20mg  daily  -Continue losartan 12.5 mg daily, aldactone 12.5 mg daily and Jardiance -attempted labs today but could not obtain will set up for FU lab appointment  Recurrent Pleural Effusions: -L and R sided both drained twice, once in March in the outpatient setting by IR and also in April during admission by PCCM -Appears based on exam and POCUS they have re-accumulated, will repeat chest x-ray -refer back to CT surgery for  further assistance  CAD status post CABG -03/2020 four-vessel CABG  -only taking aspirin currently, he has a refill of plavix waiting for him at the pharmacy he will pick this up -continue statin  Hypertension -continues to have lower bp will decrease diuretic to 20mg  daily  T2DM: -ContinueJardiance.  -HbA1c 6.8  CKD IIIa: -Cr 1.3>>Cr 1.51 on day of discharge -repeat BMP   Follow up with me in 2 weeks to bridge him to his general cardiology group where he can follow long term

## 2020-06-04 NOTE — Progress Notes (Signed)
Heart and Vascular Care Navigation  06/04/2020  Curtis Watkins 1956-01-23 [id number]  Reason for Referral: Patient seen in [facility]   Engaged with patient face to face for initial visit for Heart and Vascular Care Coordination.                                                                                                   Assessment:  Patient is a 65yo male who was widowed 4 years ago. He states he resides in a mobile home with his 25yo granddaughter. He receives $820 monthly survivors benefits from [employer] and is uninsured. He reports that he made an application for medicaid at DSS a few weeks back but has not heard anything yet. Patient reports he is smoking tobacco and expressed interest in quitting.                                     HRT/VAS Care Coordination     Patients Home Cardiology Office --  HF [facility]   Outpatient Care Team Social Worker   Social Worker Name: Lasandra Beech, [place] [phone]   Living arrangements for the past 2 months Mobile Home   Lives with: Relatives; Other (Comment)  25yo Granddaughter   Patient Current Insurance Coverage Self-Pay   Patient Has Concern With Paying Medical Bills Yes   Medical Bill Referrals: Pending medicaid (applied 3 weeks ago)   Does Patient Have Prescription Coverage? No   Home Assistive Devices/Equipment None       Social History:                                                                             SDOH Screenings   Alcohol Screen: Low Risk   . Last Alcohol Screening Score (AUDIT): 2  Depression (PHQ2-9): Not on file  Financial Resource Strain: High Risk  . Difficulty of Paying Living Expenses: Hard  Food Insecurity: No Food Insecurity  . Worried About Programme researcher, broadcasting/film/video in the Last Year: Never true  . Ran Out of Food in the Last Year: Never true  Housing: Low Risk   . Last Housing Risk Score: 0  Physical Activity: Inactive  . Days of Exercise per Week: 0 days  . Minutes of Exercise per Session: 0  min  Social Connections: Not on file  Stress: Not on file  Tobacco Use: High Risk  . Smoking Tobacco Use: Current Every Day Smoker  . Smokeless Tobacco Use: Never Used  Transportation Needs: No Transportation Needs  . Lack of Transportation (Medical): No  . Lack of Transportation (Non-Medical): No    SDOH Interventions: Financial Resources:  Financial Strain Interventions: Other (Comment) (Medicaid application pending and financial assistance with medications)  Food Insecurity:   n/a  Housing Insecurity:   n/a  Transportation:    n/a   Health Promotion Interventions:     Physical Inactivity n/a  Smoking Cessation Education Materials Given and Conservator, museum/gallery and 8 week nicotine patches.   Dietary Concerns n/a  Health Coaching CSW discussed Health Coaching and patient declined at this time.    Follow-up plan:  CSW discussed referral to Health Coach for smoking cessation although patient stated he felt he could quit with the use of the nicotine patches. CSW ordered and will be delivered to the clinic to supply to patient at next clinic visit. CSW discussed medicaid application process and will explore with financial counseling regarding recent hospital bills.  Patient verbalizes understanding of follow up and CSW will meet with patient at next clinic visit. Lasandra Beech, LCSW, CCSW-MCS [phone]

## 2020-06-04 NOTE — Telephone Encounter (Signed)
Heart Failure Nurse Navigator Progress Note  Call attempted to confirm HV TOC appt today at 3pm. HIPPA appropriate VM left with callback number.   Ozella Rocks, RN, BSN Heart Failure Nurse Navigator [phone]

## 2020-06-06 ENCOUNTER — Other Ambulatory Visit: Payer: Self-pay | Admitting: Cardiothoracic Surgery

## 2020-06-06 DIAGNOSIS — Z951 Presence of aortocoronary bypass graft: Secondary | ICD-10-CM

## 2020-06-07 ENCOUNTER — Other Ambulatory Visit (HOSPITAL_COMMUNITY)
Admission: RE | Admit: 2020-06-07 | Discharge: 2020-06-07 | Disposition: A | Discharge status: Home / Self Care | Payer: Self-pay | Source: Other Acute Inpatient Hospital

## 2020-06-07 LAB — BASIC METABOLIC PANEL
Anion gap: 6 (ref 5–15)
BUN: 32 mg/dL — ABNORMAL HIGH (ref 8–23)
CO2: 28 mmol/L (ref 22–32)
Calcium: 8.9 mg/dL (ref 8.9–10.3)
Chloride: 103 mmol/L (ref 98–111)
Creatinine, Ser: 1.58 mg/dL — ABNORMAL HIGH (ref 0.61–1.24)
GFR, Estimated: 49 mL/min — ABNORMAL LOW (ref >60)
Glucose, Bld: 227 mg/dL — ABNORMAL HIGH (ref 70–99)
Potassium: 4.3 mmol/L (ref 3.5–5.1)
Sodium: 137 mmol/L (ref 135–145)

## 2020-06-07 LAB — BRAIN NATRIURETIC PEPTIDE: B Natriuretic Peptide: 1434.7 pg/mL — ABNORMAL HIGH (ref 0.0–100.0)

## 2020-06-10 ENCOUNTER — Ambulatory Visit: Payer: Self-pay | Admitting: Cardiothoracic Surgery

## 2020-06-10 ENCOUNTER — Telehealth: Payer: Self-pay | Admitting: Cardiology

## 2020-06-10 NOTE — Telephone Encounter (Signed)
Called pt and left a message informing him that his medication was already sent to his pharmacy on 05/08/20 with refills and that he needed to call his pharmacy and speak with a live person to inquire about that medication. I advised pt that if he has any other problems, questions or concerns, to give our office a call back.

## 2020-06-10 NOTE — Telephone Encounter (Signed)
*  STAT* If patient is at the pharmacy, call can be transferred to refill team.   1. Which medications need to be refilled? (please list name of each medication and dose if known) atorvastatin (LIPITOR) 80 MG tablet  2. Which pharmacy/location (including street and city if local pharmacy) is medication to be sent to? [id number] [place] - [address]  3. Do they need a 30 day or 90 day supply? 30 day supply

## 2020-06-18 ENCOUNTER — Ambulatory Visit (INDEPENDENT_AMBULATORY_CARE_PROVIDER_SITE_OTHER): Payer: Self-pay | Admitting: Physician Assistant

## 2020-06-18 ENCOUNTER — Telehealth: Payer: Self-pay | Admitting: General Practice

## 2020-06-18 ENCOUNTER — Telehealth: Payer: Self-pay | Admitting: Cardiology

## 2020-06-18 ENCOUNTER — Telehealth (HOSPITAL_COMMUNITY): Payer: Self-pay | Admitting: Licensed Clinical Social Worker

## 2020-06-18 ENCOUNTER — Other Ambulatory Visit: Payer: Self-pay

## 2020-06-18 ENCOUNTER — Other Ambulatory Visit (HOSPITAL_COMMUNITY): Payer: Self-pay

## 2020-06-18 ENCOUNTER — Encounter: Payer: Self-pay | Admitting: Orthopedic Surgery

## 2020-06-18 DIAGNOSIS — I5021 Acute systolic (congestive) heart failure: Secondary | ICD-10-CM

## 2020-06-18 DIAGNOSIS — S98131A Complete traumatic amputation of one right lesser toe, initial encounter: Secondary | ICD-10-CM

## 2020-06-18 DIAGNOSIS — Z89421 Acquired absence of other right toe(s): Secondary | ICD-10-CM

## 2020-06-18 MED ORDER — SPIRONOLACTONE 25 MG PO TABS: 12.5000 mg | ORAL_TABLET | Freq: Every day | ORAL | 3 refills | Status: DC

## 2020-06-18 MED ORDER — LOSARTAN POTASSIUM 25 MG PO TABS: 12.5000 mg | ORAL_TABLET | Freq: Every day | ORAL | 3 refills | Status: DC

## 2020-06-18 MED ORDER — EMPAGLIFLOZIN 10 MG PO TABS
10.0000 mg | ORAL_TABLET | Freq: Every day | ORAL | 0 refills | Status: DC
Start: 2020-06-18 — End: 2020-07-02

## 2020-06-18 NOTE — Telephone Encounter (Signed)
This is a CHF pt 

## 2020-06-18 NOTE — Telephone Encounter (Signed)
    Pt c/o medication issue:  1. Name of Medication: empagliflozin (JARDIANCE) 10 MG TABS tablet  2. How are you currently taking this medication (dosage and times per day)? Take 1 tablet (10 mg total) by mouth daily.  3. Are you having a reaction (difficulty breathing--STAT)?   4. What is your medication issue? Megan pharmacist with Heart Failure clinic, she said she faxed pt assistance request at the [address] Office for pt's Jardiance. She called the pt's assistance and was told they haven't receive anything from [place], she wanted to make sure that we received her fax. she also wanted to know if we have samples available for Jardiance for pt to take.

## 2020-06-18 NOTE — Telephone Encounter (Signed)
I will ask Aundra Millet to email them to [place] since we do not have them

## 2020-06-18 NOTE — Addendum Note (Signed)
Addended by: Cheree Ditto on: 06/18/2020 01:13 PM   Modules accepted: Orders

## 2020-06-18 NOTE — Telephone Encounter (Signed)
Pt was seen at [facility] by Edd Fabian, NP. Would he like to refill these medications? Please address

## 2020-06-18 NOTE — Telephone Encounter (Signed)
Pharmacy and Charisse March Pharmacist in Heart Failure Clinic is calling to follow-up on pt assistance forms sent over to the office on his Jardiance.  It looks like this med was initiated in the hospital by Georgie Chard NP. He was assigned to Dr. Delton See but she was gone by the time he was discharged by the hospital.  He will see Tereso Newcomer PA-C in early May, and is established to see [facility].  Do you have any pt assistance forms on this pt for his Jardiance that Aundra Millet in CHF clinic faxed over?  And if not, could you assist with this? Thanks!

## 2020-06-18 NOTE — Telephone Encounter (Signed)
*  STAT* If patient is at the pharmacy, call can be transferred to refill team.   1. Which medications need to be refilled? (please list name of each medication and dose if known) losartan (COZAAR) 25 MG tablet spironolactone (ALDACTONE) 25 MG tablet  2. Which pharmacy/location (including street and city if local pharmacy) is medication to be sent to? [id number] [place], [place] - [address]  3. Do they need a 30 day or 90 day supply? 90

## 2020-06-18 NOTE — Progress Notes (Signed)
Office Visit Note   Patient: Curtis Watkins           Date of Birth: March 15, 1955           MRN: [id number] Visit Date: 06/18/2020              Requested by: Doran Stabler, NP [phone] [address] [place],  [place] [address] PCP: Doran Stabler, NP  Chief Complaint  Patient presents with  . Right Foot - Routine Post Op    04/24/20 right foot 2nd and 3rd toe amputation       HPI: Patient is 2 months status post right foot second and third toe amputations he is doing very well.  He does not have any concerns except he has a thickened toenail on the right side is wondering if this can be trimmed down  Assessment & Plan: Visit Diagnoses: No diagnosis found.  Plan: Recommended Achilles stretching to avoid any callus formation of the plantar forefoot his great toenail was trimmed down to a softer surface follow-up in 3 months  Follow-Up Instructions: No follow-ups on file.   Ortho Exam  Patient is alert, oriented, no adenopathy, well-dressed, normal affect, normal respiratory effort. Right foot well-healed surgical incision no swelling no erythema no ascending cellulitis or signs of infection he does have a very thin callus across the plantar surface and some Achilles tightness  Imaging: No results found. No images are attached to the encounter.  Labs: Lab Results  Component Value Date   HGBA1C 6.8 (H) 03/28/2020     No results found for: ALBUMIN, PREALBUMIN, CBC  Lab Results  Component Value Date   MG 2.6 (H) 04/04/2020   MG 2.6 (H) 04/04/2020   MG 2.9 (H) 04/03/2020   No results found for: VD25OH  No results found for: PREALBUMIN CBC EXTENDED Latest Ref Rng & Units 05/24/2020 04/06/2020 04/05/2020  WBC 4.0 - 10.5 K/uL 7.8 11.1(H) 10.7(H)  RBC 4.22 - 5.81 MIL/uL 3.02(L) 2.75(L) 2.53(L)  HGB 13.0 - 17.0 g/dL 9.9(I) 8.3(L) 8.1(L)  HCT 39.0 - 52.0 % 27.8(L) 25.1(L) 23.4(L)  PLT 150 - 400 K/uL 408(H) 147(L) 124(L)     There is no height or weight on file to  calculate BMI.  Orders:  No orders of the defined types were placed in this encounter.  No orders of the defined types were placed in this encounter.    Procedures: No procedures performed  Clinical Data: No additional findings.  ROS:  All other systems negative, except as noted in the HPI. Review of Systems  Objective: Vital Signs: There were no vitals taken for this visit.  Specialty Comments:  No specialty comments available.  PMFS History: Patient Active Problem List   Diagnosis Date Noted  . Acute on chronic systolic and diastolic heart failure, NYHA class 3 (HCC) 05/25/2020  . Pleural effusion   . Gangrene of toe of right foot (HCC)   . 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 Coronary artery disease involving native coronary artery of native heart with unstable angina pectoris (HCC)   . Acute congestive heart failure (HCC) 03/27/2020  . Diabetic foot ulcers (HCC) 03/27/2020  . DM2 (diabetes mellitus, type 2) (HCC) 03/27/2020  . HTN (hypertension) 03/27/2020   Past Medical History:  Diagnosis Date  . Carotid artery disease (HCC)    04/01/20: 60-79% RICA stenosis, 1-39% LICA stenosis  . CHF (congestive heart failure) (HCC)   . Coronary artery disease   . Diabetes mellitus without complication (  HCC)   . Hypertension   . Ischemic cardiomyopathy   . PAD (peripheral artery disease) (HCC)    occluded right axillary artery 03/29/20     No family history on file.  Past Surgical History:  Procedure Laterality Date  . AMPUTATION Right 04/24/2020   Procedure: RIGHT 2ND AND 3RD TOE AMPUTATION;  Surgeon: Nadara Mustard, MD;  Location: [facility] OR;  Service: Orthopedics;  Laterality: Right;  . CENTRAL VENOUS CATHETER INSERTION Left 04/03/2020   Procedure: INSERTION OF FEMORAL ARTERIAL LINE;  Surgeon: Linden Dolin, MD;  Location: [facility] OR;  Service: Open Heart Surgery;  Laterality: Left;  . CORONARY ARTERY BYPASS GRAFT N/A 04/03/2020   Procedure: CORONARY ARTERY BYPASS GRAFTING (CABG) TIMES FOUR  USING LEFT INTERNAL MAMMARY ARTERY AND LEFT RADIAL ARTERY.;  Surgeon: Linden Dolin, MD;  Location: [facility] OR;  Service: Open Heart Surgery;  Laterality: N/A;  . CORONARY PRESSURE WIRE/FFR WITH 3D MAPPING N/A 03/29/2020   Procedure: Coronary Pressure Wire/FFR w/3D Mapping;  Surgeon: Lyn Records, MD;  Location: [facility] INVASIVE CV LAB;  Service: Cardiovascular;  Laterality: N/A;  . IR THORACENTESIS ASP PLEURAL SPACE W/IMG GUIDE  05/03/2020  . IR THORACENTESIS ASP PLEURAL SPACE W/IMG GUIDE  05/16/2020  . LEFT HEART CATH AND CORONARY ANGIOGRAPHY N/A 03/29/2020   Procedure: LEFT HEART CATH AND CORONARY ANGIOGRAPHY;  Surgeon: Lyn Records, MD;  Location: [facility] INVASIVE CV LAB;  Service: Cardiovascular;  Laterality: N/A;  . RADIAL ARTERY HARVEST Left 04/03/2020   Procedure: LEFT RADIAL ARTERY HARVEST;  Surgeon: Linden Dolin, MD;  Location: [facility] OR;  Service: Open Heart Surgery;  Laterality: Left;  . TEE WITHOUT CARDIOVERSION N/A 04/03/2020   Procedure: TRANSESOPHAGEAL ECHOCARDIOGRAM (TEE);  Surgeon: Linden Dolin, MD;  Location: [facility] OR;  Service: Open Heart Surgery;  Laterality: N/A;  . UPPER EXTREMITY ANGIOGRAPHY  03/29/2020   Procedure: Upper Extremity Angiography;  Surgeon: Lyn Records, MD;  Location: [facility] INVASIVE CV LAB;  Service: Cardiovascular;;  rt.  arm   Social History   Occupational History  . Occupation: retired d/t medical conditions  Tobacco Use  . Smoking status: Current Every Day Smoker    Packs/day: 0.50    Types: Cigarettes  . Smokeless tobacco: Never Used  Vaping Use  . Vaping Use: Never used  Substance and Sexual Activity  . Alcohol use: Not Currently    Alcohol/week: 1.0 standard drink    Types: 1 Cans of beer per week  . Drug use: Not Currently    Frequency: 3.0 times per week    Types: Marijuana  . Sexual activity: Not Currently

## 2020-06-18 NOTE — Telephone Encounter (Signed)
This is not a CHF clinic pt, he was seen only in the Heart & Vascular TOC clinic and will be f/u with [facility], who saw him in the hospital and ordered these meds so refills need to come from them, thanks

## 2020-06-18 NOTE — Telephone Encounter (Signed)
Patient wants 30 day not 90 day

## 2020-06-18 NOTE — Telephone Encounter (Addendum)
Patient called to ask about refills on his medications and financial assistance. CSW informed patient that we will explore options for samples of the Jardiance and meet tomorrow in the clinic with patient to obtain other needed medications. Patient grateful and will be at his appointment tomorrow in HF TOC. Lasandra Beech, LCSW, CCSW-MCS [phone]

## 2020-06-19 ENCOUNTER — Telehealth: Payer: Self-pay | Admitting: Pharmacist

## 2020-06-19 ENCOUNTER — Encounter (HOSPITAL_COMMUNITY): Payer: Self-pay

## 2020-06-19 ENCOUNTER — Other Ambulatory Visit (HOSPITAL_COMMUNITY): Payer: Self-pay

## 2020-06-19 ENCOUNTER — Other Ambulatory Visit: Payer: Self-pay

## 2020-06-19 ENCOUNTER — Ambulatory Visit (HOSPITAL_COMMUNITY)
Admission: RE | Admit: 2020-06-19 | Discharge: 2020-06-19 | Disposition: A | Discharge status: Home / Self Care | Payer: Self-pay | Source: Ambulatory Visit | Attending: Internal Medicine | Admitting: Internal Medicine

## 2020-06-19 VITALS — HR 77 | Wt 163.2 lb

## 2020-06-19 DIAGNOSIS — I130 Hypertensive heart and chronic kidney disease with heart failure and stage 1 through stage 4 chronic kidney disease, or unspecified chronic kidney disease: Secondary | ICD-10-CM | POA: Insufficient documentation

## 2020-06-19 DIAGNOSIS — N1831 Chronic kidney disease, stage 3a: Secondary | ICD-10-CM

## 2020-06-19 DIAGNOSIS — I5022 Chronic systolic (congestive) heart failure: Secondary | ICD-10-CM | POA: Insufficient documentation

## 2020-06-19 DIAGNOSIS — Z7982 Long term (current) use of aspirin: Secondary | ICD-10-CM | POA: Insufficient documentation

## 2020-06-19 DIAGNOSIS — E1122 Type 2 diabetes mellitus with diabetic chronic kidney disease: Secondary | ICD-10-CM | POA: Insufficient documentation

## 2020-06-19 DIAGNOSIS — Z79899 Other long term (current) drug therapy: Secondary | ICD-10-CM | POA: Insufficient documentation

## 2020-06-19 DIAGNOSIS — E1100 Type 2 diabetes mellitus with hyperosmolarity without nonketotic hyperglycemic-hyperosmolar coma (NKHHC): Secondary | ICD-10-CM

## 2020-06-19 DIAGNOSIS — F17200 Nicotine dependence, unspecified, uncomplicated: Secondary | ICD-10-CM

## 2020-06-19 DIAGNOSIS — I2510 Atherosclerotic heart disease of native coronary artery without angina pectoris: Secondary | ICD-10-CM | POA: Insufficient documentation

## 2020-06-19 DIAGNOSIS — F17210 Nicotine dependence, cigarettes, uncomplicated: Secondary | ICD-10-CM | POA: Insufficient documentation

## 2020-06-19 DIAGNOSIS — I5042 Chronic combined systolic (congestive) and diastolic (congestive) heart failure: Secondary | ICD-10-CM

## 2020-06-19 DIAGNOSIS — J90 Pleural effusion, not elsewhere classified: Secondary | ICD-10-CM

## 2020-06-19 DIAGNOSIS — I255 Ischemic cardiomyopathy: Secondary | ICD-10-CM | POA: Insufficient documentation

## 2020-06-19 DIAGNOSIS — Z951 Presence of aortocoronary bypass graft: Secondary | ICD-10-CM

## 2020-06-19 DIAGNOSIS — Z7902 Long term (current) use of antithrombotics/antiplatelets: Secondary | ICD-10-CM | POA: Insufficient documentation

## 2020-06-19 LAB — BASIC METABOLIC PANEL
Anion gap: 9 (ref 5–15)
BUN: 22 mg/dL (ref 8–23)
CO2: 27 mmol/L (ref 22–32)
Calcium: 9.1 mg/dL (ref 8.9–10.3)
Chloride: 103 mmol/L (ref 98–111)
Creatinine, Ser: 1.44 mg/dL — ABNORMAL HIGH (ref 0.61–1.24)
GFR, Estimated: 54 mL/min — ABNORMAL LOW (ref >60)
Glucose, Bld: 220 mg/dL — ABNORMAL HIGH (ref 70–99)
Potassium: 4 mmol/L (ref 3.5–5.1)
Sodium: 139 mmol/L (ref 135–145)

## 2020-06-19 MED ORDER — METOPROLOL SUCCINATE ER 25 MG PO TB24
37.5000 mg | ORAL_TABLET | Freq: Every day | ORAL | 2 refills | Status: DC
  Filled 2020-06-19: qty 45, 30d supply, fill #0
  Filled 2020-07-23: qty 45, 30d supply, fill #1
  Filled 2020-08-22: qty 45, 30d supply, fill #2

## 2020-06-19 MED ORDER — CLOPIDOGREL BISULFATE 75 MG PO TABS
75.0000 mg | ORAL_TABLET | Freq: Every day | ORAL | 2 refills | Status: DC
  Filled 2020-06-19: qty 30, 30d supply, fill #0
  Filled 2020-07-23: qty 30, 30d supply, fill #1
  Filled 2020-08-22: qty 30, 30d supply, fill #2

## 2020-06-19 MED ORDER — ATORVASTATIN CALCIUM 80 MG PO TABS
80.0000 mg | ORAL_TABLET | Freq: Every day | ORAL | 2 refills | Status: DC
  Filled 2020-06-19: qty 30, 30d supply, fill #0
  Filled 2020-07-23: qty 30, 30d supply, fill #1
  Filled 2020-08-22: qty 30, 30d supply, fill #2

## 2020-06-19 MED ORDER — METOPROLOL SUCCINATE ER 25 MG PO TB24: 37.5000 mg | ORAL_TABLET | Freq: Every day | ORAL | 2 refills | Status: DC

## 2020-06-19 MED ORDER — SPIRONOLACTONE 25 MG PO TABS
12.5000 mg | ORAL_TABLET | Freq: Every day | ORAL | 2 refills | Status: DC
  Filled 2020-06-19: qty 15, 30d supply, fill #0
  Filled 2020-07-23: qty 15, 30d supply, fill #1
  Filled 2020-08-22: qty 15, 30d supply, fill #2

## 2020-06-19 MED ORDER — LOSARTAN POTASSIUM 25 MG PO TABS
12.5000 mg | ORAL_TABLET | Freq: Every day | ORAL | 2 refills | Status: DC
  Filled 2020-06-19: qty 15, 30d supply, fill #0
  Filled 2020-07-23: qty 15, 30d supply, fill #1
  Filled 2020-08-22: qty 15, 30d supply, fill #2

## 2020-06-19 MED ORDER — FUROSEMIDE 20 MG PO TABS
20.0000 mg | ORAL_TABLET | Freq: Every day | ORAL | 2 refills | Status: DC
  Filled 2020-06-19: qty 30, 30d supply, fill #0
  Filled 2020-07-23: qty 30, 30d supply, fill #1
  Filled 2020-08-22: qty 30, 30d supply, fill #2

## 2020-06-19 NOTE — Progress Notes (Signed)
CSW met with patient in the clinic to discuss medication needs and PCP. Patient requesting assistance with obtaining a new PCP and states he is unable to afford the medications at this time. CSW will explore PCP with [place] and worked with Pharmacy to obtain needed medications. CSW will continue to follow for support and PCP needs. Raquel Sarna, [place], [place]

## 2020-06-19 NOTE — Patient Instructions (Signed)
Labs done today. We will contact you only if your labs are abnormal.  INCREASE Metoprolol to 37.5mg  (1 &1/2 tablets) by mouth daily.  Thank you for allowing [place] to provider your heart failure care after your recent hospitalization.

## 2020-06-19 NOTE — Progress Notes (Signed)
[facility] Transitions of Care Clinic Heart Failure Pharmacist Encounter  PCP: Doran Stabler, NP PCP-Cardiologist: Tobias Alexander, MD  HPI:  65 yo M with PMH of CAD s/p CABG in Feb 2022, HFrEF, T2DM, PAD, and HTN. He presented to the ED on 05/24/20 with shortness of breath and LE edema. An ECHO was done on 05/26/20 and LVEF was 25-30% (down from 40-45% in Feb 2022). He was then discharged on 05/28/20.  He presented to the Heart Failure TOC Clinic for follow up on 06/04/20. He denied having shortness of breath or DOE, orthopnea/PND or edema. He had an episode of lightheadedness once prior to coming to clinic. He has been checking his weights at home and his BP at home. He had been following a fluid-restricted and sodium-restricted diet at home and reported he was taking all medications as prescribed (uses a pill box). His metoprolol was transitioned to XL 25 mg daily and his furosemide was reduced to 20 mg daily.  Today, he presents to the Heart Failure TOC Clinic for follow up visit. He denies having shortness of breath or DOE, orthopnea/PND, or edema following his reduced dose of furosemide from last visit. He has only had one episode of dizziness since his last visit. He continues to check his weight daily at home (range 157-160 lbs) and checks his BP at home before taking his medications (SBP mostly 110s, yesterday was 136/80). He has been following a fluid-restricted and sodium-restricted diet at home and reports he is taking all medications as prescribed (uses a pill box).  HF Medications: Furosemide 20 mg daily Metoprolol XL 25 mg daily Losartan 12.5 mg daily Spironolactone 12.5 mg daily Jardiance 10 mg daily  Has the patient been experiencing any side effects to the medications prescribed?  no  Does the patient have any problems obtaining medications due to transportation or finances?   Yes - no insurance; have completed and submitted assistance paperwork for Jardiance,  pending approval. He is also interested in transitioning PCP to CHW for additional financial assistance with medications.  Understanding of regimen: good Understanding of indications: good Potential of compliance: good Patient understands to avoid NSAIDs. Patient understands to avoid decongestants.   Pertinent Lab Values: . Serum creatinine 1.44, BUN 22, Potassium 4.0, Sodium 139   Vital Signs: . Weight: 163 lbs (last clinic weight: 160 lbs) . Blood pressure: SBP 108 by doppler  . Heart rate: 77   Medication Assistance / Insurance Benefits Check: Does the patient have prescription insurance?  No   Does the patient qualify for medication assistance through manufacturers or grants?   Yes . Eligible grants and/or patient assistance programs: Jardiance . Medication assistance applications in progress: Jardiance  . Medication assistance applications approved: pending approval for Jardiance Approved medication assistance renewals will be completed by: [facility]  Outpatient Pharmacy:  Current outpatient [facility] Is the patient willing to transition their outpatient pharmacy to utilize a [facility] outpatient pharmacy with or without mail order?   Yes - transitioning to [facility] outpatient pharmacy  Assessment: 1) Chronic systolic CHF (EF 97-67%), due to ICM. NYHA class II symptoms. - Continue furosemide 20 mg daily - Increase metoprolol XL to 37.5 mg daily - Continue losartan 12.5 mg daily - Continue spironolactone 12.5 mg daily - Continue Jardiance 10 mg daily  Plan: 1) Medication changes: - Increase metoprolol XL to 37.5 mg daily  2) Patient Assistance: - Jardiance patient assistance application has been submitted, pending approval - Patient would like to transition  PCP to community health and wellness clinic to utilize additional financial resources in their pharmacy - Samples of Jardiance and aspirin provided today - Appreciate CSW assistance with affording medications in clinic  today  3) Follow up: - Next appointment with [facility] on 07/02/20  Sharen Hones, PharmD, BCPS Heart Failure Transitions of Care Clinic Pharmacist [phone]

## 2020-06-19 NOTE — Telephone Encounter (Signed)
Curtis Watkins, [facility]     06/19/20 11:43 AM Note Patient Assistance form for Jardiance 10mg  sent to [facility] at  [phone]        [phone], [facility] to [facility]      06/19/20 11:42 AM Patient Assistance

## 2020-06-19 NOTE — Telephone Encounter (Signed)
Patient Assistance form for Jardiance 10mg  sent to [facility] at  [phone]

## 2020-06-19 NOTE — Progress Notes (Signed)
Heart [facility] Vascular Center Transitions of Care Clinic  PCP: Mikki Santee Primary Cardiologist: Tobias Alexander    HPI:  Curtis Watkins is a 65 y.o.  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 He had an echocardiogram performed, and was found to have a mid range ejection fraction with regional wall motion abnormalities. He underwent left heart catheterization the following day, and was found to have severe two-vessel disease involving the LAD and circumflex. He underwent four-vessel CABG on February 9.His postoperative course was relatively uncomplicated and he was discharged on the 13th. In early March he underwent second and third toe amputation for a known diabetic foot ulcer. He has been following with Dr. Delton See and Dr. Vickey Sages in clinic. He was noted to have a large left pleural effusion at his surgical follow-up last month, and underwent thoracentesis on the 24th with over 1-1/2 L of serosanguineous fluid removal.   Continued to struggle with worsening shortness of breath and volume overload in the outpatient setting.  This led to his admission on 05/24/2020.  During admission he was diuresed 20lbs to 158 with improvement in symptoms.  He also underwent repeat R thoracentesis with 1500cc removed. His GDMT was optimized and he was discharged on losartan 12.5mg , metoprolol succinate 12.5mg , spiro 12.5mg , and jardiance 10mg  and lasix 40mg .  Cr increased slightly during admission 1.30>1.55.    Last visit was a little dry on exam, we decreased his lasix to 20mg  daily,  He was still smoking 1/2 ppd.  Weight was stable at home 160lbs daily by his scale and ours.    Still smoking now a little more than half a pack per day.   Breathing has been doing very well, sleeping a lot better.  Doing a little bit of exercise, doing things around the house, doing his shopping not getting short of breath.  Walking here did very well, did not have to stop and rest.  Weighing himself at home stays around 160lbs stays around 157-160 by his home scale.  Checks bp at home runs around 100-110 systolic most of the time but denies any dizziness or lightheadedness at home.     ROS: All systems negative except as listed in HPI, PMH and Problem List.  SH:  Social History   Socioeconomic History  . Marital status: Significant Other    Spouse name: Not on file  . Number of children: 3  . Years of education: Not on file  . Highest education level: High school graduate  Occupational History  . Occupation: retired d/t medical conditions  Tobacco Use  . Smoking status: Current Every Day Smoker    Packs/day: 0.50    Types: Cigarettes  . Smokeless tobacco: Never Used  Vaping Use  . Vaping Use: Never used  Substance and Sexual Activity  . Alcohol use: Not Currently    Alcohol/week: 1.0 standard drink    Types: 1 Cans of beer per week  . Drug use: Not Currently    Frequency: 3.0 times per week    Types: Marijuana  . Sexual activity: Not Currently  Other Topics Concern  . Not on file  Social History Narrative  . Not on file   Social Determinants of Health   Financial Resource Strain: High Risk  . Difficulty of  Paying Living Expenses: Hard  Food Insecurity: No Food Insecurity  . Worried About Programme researcher, broadcasting/film/video in the Last Year: Never true  . Ran Out of Food in the Last Year: Never true  Transportation Needs: No Transportation Needs  . Lack of Transportation (Medical): No  . Lack of Transportation (Non-Medical): No  Physical Activity: Inactive  . Days of Exercise per Week: 0 days  . Minutes of Exercise per Session: 0 min  Stress: Not on file  Social Connections: Not on file  Intimate Partner Violence: Not on file     FH: No family history on file.  Past Medical History:  Diagnosis Date  . Carotid artery disease (HCC)    04/01/20: 60-79% RICA stenosis, 1-39% LICA stenosis  . CHF (congestive heart failure) (HCC)   . Coronary artery disease   . Diabetes mellitus without complication (HCC)   . Hypertension   . Ischemic cardiomyopathy   . PAD (peripheral artery disease) (HCC)    occluded right axillary artery 03/29/20     Current Outpatient Medications  Medication Sig Dispense Refill  . aspirin 81 MG tablet Take 1 tablet (81 mg total) by mouth daily.    Marland Kitchen atorvastatin (LIPITOR) 80 MG tablet Take 1 tablet (80 mg total) by mouth daily. 90 tablet 3  . brimonidine (ALPHAGAN) 0.2 % ophthalmic solution Place 1 drop into the right eye as needed.    . clopidogrel (PLAVIX) 75 MG tablet Take 1 tablet (75 mg total) by mouth daily. 90 tablet 3  . empagliflozin (JARDIANCE) 10 MG TABS tablet Take 1 tablet (10 mg total) by mouth daily. 90 tablet 3  . empagliflozin (JARDIANCE) 10 MG TABS tablet Take 1 tablet (10 mg total) by mouth daily. 14 tablet 0  . furosemide (LASIX) 40 MG tablet Take 20 mg by mouth daily.    Marland Kitchen losartan (COZAAR) 25 MG tablet Take 1/2 tablet (12.5 mg total) by mouth daily. 90 tablet 3  . metoprolol succinate (TOPROL XL) 25 MG 24 hr tablet Take 1 tablet (25 mg total) by mouth daily. 30 tablet 0  . OVER THE COUNTER MEDICATION Apply 1 application topically daily. Diabetic lotion    . spironolactone (ALDACTONE) 25 MG tablet Take 1/2 tablet (12.5 mg total) by mouth daily. 90 tablet 3   No current facility-administered medications for this encounter.    Vitals:   06/19/20 1505  Pulse: 77  SpO2: 100%  Weight: 74 kg (163 lb 3.2 oz)    PHYSICAL EXAM: Cardiac: JVD flat, normal rate and rhythm, clear s1 and s2, no murmurs, rubs or gallops, trace LE edema bilaterally Pulmonary: decreased breath sounds in bases, not in distress Abdominal: non distended abdomen, soft and nontender Psych: Alert,  conversant, in good spirits    ASSESSMENT & PLAN: Chronic systolic heart failure, Ischemic Cardiomyopathy: -Hx of CABG x4 in 03/2020 with EF at 40-45% on 05/2020 dropped to 25-30% -06/04/20 POCUS: EF has improved back to 40-45%, IVC collapsible with >50% respiratory variability, Bilateral Pleural effusions persist R>L -hx of requiring recurrent thoracentesis procedures R and L -NYHA class II symptoms, euvolemic on exam -Last visit switched tartrate to succinate 25mg , decreased lasix to 20mg  daily  -Continue losartan 12.5 mg daily, aldactone 12.5 mg daily, lasix 20mg  daily and Jardiance 10 -not much room for titration with bp, HR 77 today will increase metoprolol succinate to 37.5mg  daily, this shouldn't affect the bp too much  Recurrent Pleural Effusions: -since CABG in February, L and R sided both drained twice,  once in March in the outpatient setting by IR and also in April during admission by PCCM -Last visit seemed to persist despite being dry on examination, I repeated chest x-ray which showed small bilateral effusions with possible slight decrease in size compared with admission chest x-ray.  Appreciate CT surgery taking a look at this also.     CAD status post CABG -03/2020 four-vessel CABG  -continue asa, plavix, bb, statin -continue working on smoking cessation given nicotine patches today  Tobacco Use Disorder: -struggling to quit, smoking a bit more since I saw him last >1/2ppd -continue working on smoking cessation given nicotine patches today  Hypertension -well controlled on CHF meds above, increased metoprolol succinate to 37.5mg    T2DM: -Continue Jardiance.  -last HbA1c 6.8  CKD IIIa: -Cr 1.3>>Cr 1.51 on day of discharge 05/28/20 -repeat BMP today,  last visit Cr 1.58    Follow up with  general cardiology

## 2020-06-20 ENCOUNTER — Telehealth (HOSPITAL_COMMUNITY): Payer: Self-pay | Admitting: Licensed Clinical Social Worker

## 2020-06-20 ENCOUNTER — Ambulatory Visit
Admission: RE | Admit: 2020-06-20 | Discharge: 2020-06-20 | Disposition: A | Discharge status: Home / Self Care | Payer: Self-pay | Source: Ambulatory Visit | Attending: Cardiothoracic Surgery | Admitting: Cardiothoracic Surgery

## 2020-06-20 ENCOUNTER — Ambulatory Visit (INDEPENDENT_AMBULATORY_CARE_PROVIDER_SITE_OTHER): Payer: Self-pay | Admitting: Cardiothoracic Surgery

## 2020-06-20 ENCOUNTER — Telehealth: Payer: Self-pay | Admitting: Adult Health

## 2020-06-20 VITALS — BP 121/77 | HR 80 | Resp 20 | Ht 71.0 in | Wt 165.0 lb

## 2020-06-20 DIAGNOSIS — Z951 Presence of aortocoronary bypass graft: Secondary | ICD-10-CM

## 2020-06-20 DIAGNOSIS — J90 Pleural effusion, not elsewhere classified: Secondary | ICD-10-CM

## 2020-06-20 NOTE — Progress Notes (Signed)
The patient is seen to review his chest x-ray.  He is status post CABG on 04/03/2020.  He did well after surgery but did have recurrent pleural effusions and diastolic dysfunction.  He has had a couple of different thoracentesis procedures.  He now returns feeling better.  He is working on stopping smoking  Physical exam: BP 121/77   Pulse 80   Resp 20   Ht 5\' 11"  (1.803 m)   Wt 74.8 kg   SpO2 98% Comment: RA  BMI 23.01 kg/m  Well-appearing no acute distress Clear to auscultation bilaterally Regular rate and rhythm No edema  Imaging: I have personally reviewed his available chest x-ray from today which demonstrates tiny bilateral effusions   Impression/plan: Residual small bilateral pleural effusions status post CABG.  These would not contribute in any meaningful way to her respiratory difficulties.  More likely, residual smoking would lead to shortness of breath in this case.  He is working on that.  He would like to go back to work in June and I think this is entirely appropriate.  Follow-up as needed.  Deylan Canterbury Z. July, MD [phone]

## 2020-06-20 NOTE — Telephone Encounter (Signed)
Received fax from [facility] that pt was approved for Jardiance 10mg  daily through 02/22/21. Called pt and advised him to contact [facility] at [phone] to coordinate shipment of medication to his home. He was appreciative for call and assistance.

## 2020-06-20 NOTE — Telephone Encounter (Signed)
CSW contacted patient to inform of his PCP visit. Patient has appointment at [facility] on Jul 05, 2020 at 10:50am. Patient shared that he has some transportation concerns to some upcoming appointments. CSW enrolled patient in [facility] [employer] for transport to [employer]. Patient grateful for the support and will stay in contact with CSW for added assistance. Lasandra Beech, LCSW, CCSW-MCS [phone]

## 2020-06-20 NOTE — Telephone Encounter (Signed)
   RAFAL ARCHULETA DOB: 1955/11/08 MRN: [id number]   RIDER WAIVER AND RELEASE OF LIABILITY  For purposes of improving physical access to our facilities, [facility] is pleased to partner with third parties to provide [facility] patients or other authorized individuals the option of convenient, on-demand ground transportation services (the Chiropractor") through use of the technology service that enables users to request on-demand ground transportation from independent third-party providers.  By opting to use and accept these [employer], I, the undersigned, hereby agree on behalf of myself, and on behalf of any minor child using the [employer] for whom I am the parent or legal guardian, as follows:  1. Science writer provided to me are provided by independent third-party transportation providers who are not [employer] or employees and who are unaffiliated with [employer]. 2. [facility] is neither a transportation carrier nor a common or public carrier. 3. [facility] has no control over the quality or safety of the transportation that occurs as a result of the [employer]. 4. [facility] cannot guarantee that any third-party transportation provider will complete any arranged transportation service. 5. [facility] makes no representation, warranty, or guarantee regarding the reliability, timeliness, quality, safety, suitability, or availability of any of the Transport Services or that they will be error free. 6. I fully understand that traveling by vehicle involves risks and dangers of serious bodily injury, including permanent disability, paralysis, and death. I agree, on behalf of myself and on behalf of any minor child using the Transport Services for whom I am the parent or legal guardian, that the entire risk arising out of my use of the [employer] remains solely with me, to the maximum extent permitted under applicable law. 7. The [employer]  are provided "as is" and "as available." [facility] disclaims all representations and warranties, express, implied or statutory, not expressly set out in these terms, including the implied warranties of merchantability and fitness for a particular purpose. 8. I hereby waive and release [facility], its agents, employees, officers, directors, representatives, insurers, attorneys, assigns, successors, subsidiaries, and affiliates from any and all past, present, or future claims, demands, liabilities, actions, causes of action, or suits of any kind directly or indirectly arising from acceptance and use of the [employer]. 9. I further waive and release [facility] and its affiliates from all present and future liability and responsibility for any injury or death to persons or damages to property caused by or related to the use of the [employer]. 10. I have read this Waiver and Release of Liability, and I understand the terms used in it and their legal significance. This Waiver is freely and voluntarily given with the understanding that my right (as well as the right of any minor child for whom I am the parent or legal guardian using the [employer]) to legal recourse against  in connection with the [employer] is knowingly surrendered in return for use of these services.   I attest that I read the consent document to [name], gave Mr. Seiden the opportunity to ask questions and answered the questions asked (if any). I affirm that [name] then provided consent for he's participation in this program.      Evette Doffing

## 2020-06-28 ENCOUNTER — Other Ambulatory Visit (HOSPITAL_COMMUNITY): Payer: Self-pay

## 2020-07-02 ENCOUNTER — Other Ambulatory Visit: Payer: Self-pay

## 2020-07-02 ENCOUNTER — Ambulatory Visit (INDEPENDENT_AMBULATORY_CARE_PROVIDER_SITE_OTHER): Payer: Self-pay | Admitting: Physician Assistant

## 2020-07-02 ENCOUNTER — Encounter: Payer: Self-pay | Admitting: Physician Assistant

## 2020-07-02 VITALS — BP 100/80 | HR 88 | Ht 71.0 in | Wt 165.2 lb

## 2020-07-02 DIAGNOSIS — I2510 Atherosclerotic heart disease of native coronary artery without angina pectoris: Secondary | ICD-10-CM

## 2020-07-02 DIAGNOSIS — I5020 Unspecified systolic (congestive) heart failure: Secondary | ICD-10-CM

## 2020-07-02 DIAGNOSIS — E1100 Type 2 diabetes mellitus with hyperosmolarity without nonketotic hyperglycemic-hyperosmolar coma (NKHHC): Secondary | ICD-10-CM

## 2020-07-02 DIAGNOSIS — N1831 Chronic kidney disease, stage 3a: Secondary | ICD-10-CM

## 2020-07-02 DIAGNOSIS — Z720 Tobacco use: Secondary | ICD-10-CM

## 2020-07-02 DIAGNOSIS — E7800 Pure hypercholesterolemia, unspecified: Secondary | ICD-10-CM

## 2020-07-02 DIAGNOSIS — I10 Essential (primary) hypertension: Secondary | ICD-10-CM

## 2020-07-02 NOTE — Progress Notes (Signed)
Cardiology Office Note:    Date:  07/02/2020   ID:  Curtis Watkins, DOB 1955-06-04, MRN [id number]  PCP:  Doran Stabler, NP   [facility] Providers Cardiologist:  Meriam Sprague, MD Cardiology APP:  Kennon Rounds     Referring MD: Mikki Santee Key, *   Chief Complaint:  Follow-up (CAD, CHF)    Patient Profile:    Curtis Watkins is a 65 y.o. male with:   Coronary artery disease  NSTEMI s/p CABG in 03/2020  Left pleural effusion s/p thoracentesis in 04/2020 (Dr. 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  Ischemic cardiomyopathy with LVEF 35 to 40%.  LVEDP is less than 10 mmHg.  Left main is widely patent  Right coronary is widely patent but contains diffuse atherosclerosis without focal narrowing.  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 RECOMMENDATIONS:  T CTS evaluation to consider coronary artery bypass grafting with LIMA to the LAD.  SVG to circumflex or arterial graft assuming that the left radial has flow..   History of Present Illness: Mr. [name] was admitted in 2/22 with a non-STEMI and acute CHF.  He is EF is 40 at that time.  He had severe two-vessel CAD and underwent CABG.  Postoperatively, he had a left effusion that was treated with thoracentesis as an outpatient.  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  He was then admitted 4/1-4/5 with decompensated heart failure.  Echocardiogram demonstrated worsening LV function with an EF of 25-30.  He was followed by cardiology and diuresed.  He had a right pleural effusion that was treated with thoracentesis.  Discharge weight was 158 pounds.  Low blood pressure limited GDMT titration.  He saw Dr. Alfredo Bach follow-up as an outpatient 4/28 and repeat chest x-ray demonstrated tiny bilateral pleural effusions.  He was also seen twice in the [facility] heart failure clinic.  He returns for follow-up.  He is here alone.  He is doing well without significant chest discomfort, shortness of breath, orthopnea, leg edema or syncope.    Past Medical History:  Diagnosis Date  . Carotid artery disease (HCC)    04/01/20: 60-79% RICA stenosis, 1-39% LICA stenosis  . CHF (congestive heart failure) (HCC)   . Coronary artery disease   . Diabetes mellitus without complication (HCC)   . Hypertension   . Ischemic cardiomyopathy   . PAD (peripheral artery disease) (HCC)    occluded right axillary artery 03/29/20     Current Medications: Current Meds  Medication Sig  . aspirin 81 MG tablet Take 1 tablet (81 mg total) by mouth daily.  Marland Kitchen atorvastatin (LIPITOR) 80 MG tablet Take 1 tablet (80 mg total) by mouth daily.  . brimonidine (ALPHAGAN) 0.2 % ophthalmic solution Place 1 drop into the  right eye as needed.  . clopidogrel (PLAVIX) 75 MG tablet Take 1 tablet (75 mg total) by mouth daily.  . empagliflozin (JARDIANCE) 10 MG TABS tablet Take 1 tablet (10 mg total) by mouth daily.  . furosemide (LASIX) 20 MG tablet Take 1 tablet (20 mg total) by mouth daily.  Marland Kitchen losartan (COZAAR) 25 MG tablet Take 1/2 tablet (12.5 mg total) by mouth daily.  . metoprolol succinate (TOPROL XL) 25 MG 24 hr tablet Take 1.5 tablets (37.5 mg total) by mouth daily.  Marland Kitchen OVER THE COUNTER MEDICATION  Apply 1 application topically daily. Diabetic lotion  . spironolactone (ALDACTONE) 25 MG tablet Take 1/2 tablet (12.5 mg total) by mouth daily.     Allergies:   Patient has no known allergies.   Social History   Tobacco Use  . Smoking status: Current Every Day Smoker    Packs/day: 0.50    Types: Cigarettes  . Smokeless tobacco: Never Used  Vaping Use  . Vaping Use: Never used  Substance Use Topics  . Alcohol use: Not Currently    Alcohol/week: 1.0 standard drink    Types: 1 Cans of beer per week  . Drug use: Not Currently    Frequency: 3.0 times per week    Types: Marijuana     Family Hx: The patient's family history is not on file.  Review of Systems  Gastrointestinal: Negative for hematochezia.  Genitourinary: Negative for hematuria.     EKGs/Labs/Other Test Reviewed:    EKG:  EKG is not ordered today.  The ekg ordered today demonstrates n/a  Recent Labs: 04/04/2020: Magnesium 2.6 05/24/2020: Hemoglobin 8.7; Platelets 408 06/07/2020: B Natriuretic Peptide 1,434.7 06/19/2020: BUN 22; Creatinine, Ser 1.44; Potassium 4.0; Sodium 139   Recent Lipid Panel Lab Results  Component Value Date/Time   CHOL 119 03/29/2020 06:00 AM   TRIG 59 03/29/2020 06:00 AM   HDL 42 03/29/2020 06:00 AM   CHOLHDL 2.8 03/29/2020 06:00 AM   LDLCALC 65 03/29/2020 06:00 AM      Risk Assessment/Calculations:      Physical Exam:    VS:  BP 100/80   Pulse 88   Ht 5\' 11"  (1.803 m)   Wt 165 lb 3.2 oz (74.9 kg)   SpO2 98%   BMI 23.04 kg/m     Wt Readings from Last 3 Encounters:  07/02/20 165 lb 3.2 oz (74.9 kg)  06/20/20 165 lb (74.8 kg)  06/19/20 163 lb 3.2 oz (74 kg)     Constitutional:      Appearance: Healthy appearance. Not in distress.  Neck:     Vascular: JVD normal.  Pulmonary:     Effort: Pulmonary effort is normal.     Breath sounds: No wheezing. No rales.  Cardiovascular:     Normal rate. Regular rhythm. Normal S1. Normal S2.     Murmurs: There is no murmur.   Edema:    Peripheral edema absent.  Abdominal:     Palpations: Abdomen is soft. There is no hepatomegaly.  Skin:    General: Skin is warm and dry.  Neurological:     General: No focal deficit present.     Mental Status: Alert and oriented to person, place and time.     Cranial Nerves: Cranial nerves are intact.          ASSESSMENT & PLAN:    1. HFrEF (heart failure with reduced ejection fraction) (HCC) EF 25-30 by echocardiogram 05/2020.  Ischemic cardiomyopathy.  NYHA II.  Volume status  is currently stable.  Low blood pressure limits titration of GDMT.  Continue current dose of metoprolol succinate, losartan, spironolactone and empagliflozin.  He will need a follow-up echocardiogram to reassess LV function.  I will arrange this in late June.  If his EF remains <35%, he will need referral to EP for consideration of ICD.  Given Dr. Lindaann Slough move, I will continue to follow him here along with Dr. Shari Prows.  Follow-up in 8 weeks.  2. Coronary artery disease involving native coronary artery of native heart without angina pectoris Status post non-STEMI in 2/22 followed by CABG.  He is doing well without anginal symptoms.  He is not interested in formal cardiac rehabilitation.  I encouraged him to continue walking.  Continue aspirin, clopidogrel, atorvastatin, beta-blocker, ARB.  We discussed the importance of continuing dual antiplatelet therapy for 1 year post MI.  3. Stage 3a chronic kidney disease (HCC) Recent creatinine stable.  4. Type 2 diabetes mellitus with hyperosmolarity without coma, without long-term current use of insulin (HCC) Continue empagliflozin.  Follow-up with primary care  5. Tobacco abuse He is close to quitting.  He is down to 2 cigarettes a day.  6. Essential hypertension The patient's blood pressure is controlled on his current regimen.  Continue current therapy.   7. Pure hypercholesterolemia LDL optimal on most recent lab work.  Continue current Rx.         Dispo:  Return in about 8 weeks (around 08/27/2020) for Routine Follow Up, w/ Dr. Shari Prows, or Tereso Newcomer, PA-C.   Medication Adjustments/Labs and Tests Ordered: Current medicines are reviewed at length with the patient today.  Concerns regarding medicines are outlined above.  Tests Ordered: Orders Placed This Encounter  Procedures  . ECHOCARDIOGRAM LIMITED   Medication Changes: No orders of the defined types were placed in this encounter.   Signed, Tereso Newcomer, PA-C  07/02/2020 4:45 PM    [facility] [address] [place], [place], [place]  [address] Phone: [phone]; Fax: [phone]

## 2020-07-02 NOTE — Patient Instructions (Addendum)
Medication Instructions:  NO CHANGES *If you need a refill on your cardiac medications before your next appointment, please call your pharmacy*   Lab Work: NONE If you have labs (blood work) drawn today and your tests are completely normal, you will receive your results only by: Marland Kitchen MyChart Message (if you have MyChart) OR . A paper copy in the mail If you have any lab test that is abnormal or we need to change your treatment, we will call you to review the results.   Testing/Procedures: Your physician has requested that you have an echocardiogram. Echocardiography is a painless test that uses sound waves to create images of your heart. It provides your doctor with information about the size and shape of your heart and how well your heart's chambers and valves are working. This procedure takes approximately one hour. There are no restrictions for this procedure. LIMITED ECHO DUE LATE June AND SEE [facility] OR DR Shari Prows AFTER DONE    Follow-Up: At [facility], you and your health needs are our priority.  As part of our continuing mission to provide you with exceptional heart care, we have created designated Provider Care Teams.  These Care Teams include your primary Cardiologist (physician) and Advanced Practice Providers (APPs -  Physician Assistants and Nurse Practitioners) who all work together to provide you with the care you need, when you need it.  We recommend signing up for the patient portal called "MyChart".  Sign up information is provided on this After Visit Summary.  MyChart is used to connect with patients for Virtual Visits (Telemedicine).  Patients are able to view lab/test results, encounter notes, upcoming appointments, etc.  Non-urgent messages can be sent to your provider as well.   To learn more about what you can do with MyChart, go to [email or web address].    Your next appointment:   8 week(s)  The format for your next appointment:   In Person  Provider:   You  may see DR Shari Prows  or one of the following Advanced Practice Providers on your designated Care Team:    Tereso Newcomer, PA-C  [place], [place]    Other Instructions NONE

## 2020-07-03 NOTE — Progress Notes (Signed)
Virtual Visit via Telephone Note  I connected with Curtis Watkins, on 07/05/2020 at 11:49 AM by telephone due to the COVID-19 pandemic and verified that I am speaking with the correct person using two identifiers.  Due to current restrictions/limitations of in-office visits due to the COVID-19 pandemic, this scheduled clinical appointment was converted to a telehealth visit.   Consent: I discussed the limitations, risks, security and privacy concerns of performing an evaluation and management service by telephone and the availability of in person appointments. I also discussed with the patient that there may be a patient responsible charge related to this service. The patient expressed understanding and agreed to proceed.  Location of Patient: Home  Location of Provider: [facility]  Persons participating in Telemedicine visit: SALEEM COCCIA Ricky Stabs, NP Margorie John, CMA  History of Present Illness:  Curtis Watkins is to establish care. Patient has a PMH significant for acute congestive heart failure, acute on chronic systolic and diastolic heart failure NYHA class 3, coronary artery disease involving native coronary artery of native heart with unstable angina pectoris, hypertension, pleural effusion, diabetic foot ulcers, diabetes mellitus type 2, and s/p CABG x 4.   Current issues and/or concerns: None  Past Medical History:  Diagnosis Date  . Carotid artery disease (HCC)    04/01/20: 60-79% RICA stenosis, 1-39% LICA stenosis  . CHF (congestive heart failure) (HCC)   . Coronary artery disease   . Diabetes mellitus without complication (HCC)   . Hypertension   . Ischemic cardiomyopathy   . PAD (peripheral artery disease) (HCC)    occluded right axillary artery 03/29/20    No Known Allergies  Current Outpatient Medications on File Prior to Visit  Medication Sig Dispense Refill  . aspirin 81 MG tablet Take 1 tablet (81 mg total) by mouth daily.    Marland Kitchen  atorvastatin (LIPITOR) 80 MG tablet Take 1 tablet (80 mg total) by mouth daily. 30 tablet 2  . brimonidine (ALPHAGAN) 0.2 % ophthalmic solution Place 1 drop into the right eye as needed.    . clopidogrel (PLAVIX) 75 MG tablet Take 1 tablet (75 mg total) by mouth daily. 30 tablet 2  . empagliflozin (JARDIANCE) 10 MG TABS tablet Take 1 tablet (10 mg total) by mouth daily. 90 tablet 3  . furosemide (LASIX) 20 MG tablet Take 1 tablet (20 mg total) by mouth daily. 30 tablet 2  . losartan (COZAAR) 25 MG tablet Take 1/2 tablet (12.5 mg total) by mouth daily. 15 tablet 2  . metoprolol succinate (TOPROL XL) 25 MG 24 hr tablet Take 1.5 tablets (37.5 mg total) by mouth daily. 45 tablet 2  . OVER THE COUNTER MEDICATION Apply 1 application topically daily. Diabetic lotion    . spironolactone (ALDACTONE) 25 MG tablet Take 1/2 tablet (12.5 mg total) by mouth daily. 15 tablet 2   No current facility-administered medications on file prior to visit.    Observations/Objective: Alert and oriented x 3. Not in acute distress. Physical examination not completed as this is a telemedicine visit.  Assessment and Plan: 1. Encounter to establish care: - Patient presents today to establish care.  - Patient reports he is up-to-date on annual physical exam.  2. Type 2 diabetes mellitus with hyperosmolarity without coma, without long-term current use of insulin (HCC): - Follow-up with primary provider in 4 weeks or sooner if needed.   Follow Up Instructions: Follow-up with primary provider in 4 weeks or sooner if needed  for diabetes management.   Patient was given clear instructions to go to Emergency Department or return to medical center if symptoms don't improve, worsen, or new problems develop.The patient verbalized understanding.  I discussed the assessment and treatment plan with the patient. The patient was provided an opportunity to ask questions and all were answered. The patient agreed with the plan and  demonstrated an understanding of the instructions.   The patient was advised to call back or seek an in-person evaluation if the symptoms worsen or if the condition fails to improve as anticipated.   I provided 5 minutes total of non-face-to-face time during this encounter.   Rema Fendt, NP  [facility] at [facility] [place], [place] [phone] 07/05/2020, 11:49 AM

## 2020-07-05 ENCOUNTER — Encounter: Payer: Self-pay | Admitting: Family

## 2020-07-05 ENCOUNTER — Other Ambulatory Visit: Payer: Self-pay

## 2020-07-05 ENCOUNTER — Telehealth (INDEPENDENT_AMBULATORY_CARE_PROVIDER_SITE_OTHER): Payer: Self-pay | Admitting: Family

## 2020-07-05 DIAGNOSIS — E1100 Type 2 diabetes mellitus with hyperosmolarity without nonketotic hyperglycemic-hyperosmolar coma (NKHHC): Secondary | ICD-10-CM

## 2020-07-05 DIAGNOSIS — Z7689 Persons encountering health services in other specified circumstances: Secondary | ICD-10-CM

## 2020-07-05 NOTE — Progress Notes (Signed)
Establish care

## 2020-07-23 ENCOUNTER — Other Ambulatory Visit (HOSPITAL_COMMUNITY): Payer: Self-pay

## 2020-07-24 ENCOUNTER — Telehealth (HOSPITAL_COMMUNITY): Payer: Self-pay | Admitting: Licensed Clinical Social Worker

## 2020-07-24 NOTE — Telephone Encounter (Signed)
CSW contacted patient to follow up on insurance/CAFA. Patient thinks he submitted the CAFA although it appears that the application did not make it's way to financial counseling. Patient reports that he completed a medicaid application back in March and unsure the current status. CSW encouraged patient to follow up with DSS to inquire about his medicaid application prior to filing CAFA. Patient verbalizes understanding and will reach out to CSW if needed. Lasandra Beech, LCSW, CCSW-MCS [phone]

## 2020-08-01 NOTE — Progress Notes (Signed)
Patient ID: Curtis Watkins, male    DOB: 17-Jan-1956  MRN: [id number]  CC: Annual Physical Exam  Subjective: Curtis Watkins is a 65 y.o. male who presents for annual physical exam. Patient followed by Cardiology for congestive heart failure, hypertension, and hyperlipidemia.   His concerns today include:   DIABETES TYPE 2: Last A1C: 7.8% on 08/02/2020 Med Adherence:  [x]  Yes    []  No Medication side effects:  []  Yes    [x]  No Home Monitoring?  [x]  Yes    []  No Home glucose results range:120's-170's  Diet Adherence: trying  Exercise: [x]  Yes    []  No Hypoglycemic episodes?: []  Yes    [x]  No Last eye exam: Visit April 2022 at [facility]. Reports has a follow-up appointment scheduled next week.   2. ERECTILE DYSFUNCTION: Has tried pills and injections in the past without success. Was seeing a provider in [place] for the same. Would like to try penile pump.   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     atorvastatin (LIPITOR) 80 MG tablet Take 1 tablet (80 mg total) by mouth daily. 30 tablet 2   brimonidine (ALPHAGAN) 0.2 % ophthalmic solution Place 1 drop into the right eye as needed.     clopidogrel (PLAVIX) 75 MG tablet Take 1 tablet (75 mg total) by mouth daily. 30 tablet 2   furosemide (LASIX) 20 MG  tablet Take 1 tablet (20 mg total) by mouth daily. 30 tablet 2   losartan (COZAAR) 25 MG tablet Take 1/2 tablet (12.5 mg total) by mouth daily. 15 tablet 2   metoprolol succinate (TOPROL XL) 25 MG 24 hr tablet Take 1.5 tablets (37.5 mg total) by mouth daily. 45 tablet 2   OVER THE COUNTER MEDICATION Apply 1 application topically daily. Diabetic lotion     spironolactone (ALDACTONE) 25 MG tablet Take 1/2 tablet (12.5 mg total) by mouth daily. 15 tablet 2   No current facility-administered medications on file prior to visit.    No Known Allergies  Social History   Socioeconomic History   Marital status: Significant Other    Spouse name: Not on file   Number of children: 3   Years of education: Not on file   Highest education level: High school graduate  Occupational History   Occupation: retired d/t medical conditions  Tobacco Use   Smoking status: Some Days    Packs/day: 0.25    Years: 2.00    Pack years: 0.50    Types: Cigarettes   Smokeless tobacco: Never  Vaping Use   Vaping Use: Never used  Substance and Sexual Activity   Alcohol use: Not Currently    Alcohol/week: 1.0 standard drink    Types: 1 Cans of  beer per week    Comment: occassionally   Drug use: Not Currently    Frequency: 3.0 times per week    Types: Marijuana   Sexual activity: Not Currently  Other Topics Concern   Not on file  Social History Narrative   Not on file   Social Determinants of Health   Financial Resource Strain: High Risk   Difficulty of Paying Living Expenses: Hard  Food Insecurity: No Food Insecurity   Worried About Running Out of Food in the Last Year: Never true   Ran Out of Food in the Last Year: Never true  Transportation Needs: Unmet Transportation Needs   Lack of Transportation (Medical): Yes   Lack of Transportation (Non-Medical): No  Physical Activity: Inactive   Days of Exercise per Week: 0 days   Minutes of Exercise per Session: 0 min  Stress: Not on file  Social  Connections: Not on file  Intimate Partner Violence: Not on file    No family history on file.  Past Surgical History:  Procedure Laterality Date   AMPUTATION Right 04/24/2020   Procedure: RIGHT 2ND AND 3RD TOE AMPUTATION;  Surgeon: Nadara Mustarduda, Marcus V, MD;  [facility];  Service: Orthopedics;  Laterality: Right;   CENTRAL VENOUS CATHETER INSERTION Left 04/03/2020   Procedure: INSERTION OF FEMORAL ARTERIAL LINE;  Surgeon: Linden DolinAtkins, Broadus Z, MD;  Location: [facility] OR;  Service: Open Heart Surgery;  Laterality: Left;   CORONARY ARTERY BYPASS GRAFT N/A 04/03/2020   Procedure: CORONARY ARTERY BYPASS GRAFTING (CABG) TIMES FOUR USING LEFT INTERNAL MAMMARY ARTERY AND LEFT RADIAL ARTERY.;  Surgeon: Linden DolinAtkins, Broadus Z, MD;  Location: [facility] OR;  Service: Open Heart Surgery;  Laterality: N/A;   CORONARY PRESSURE WIRE/FFR WITH 3D MAPPING N/A 03/29/2020   Procedure: Coronary Pressure Wire/FFR w/3D Mapping;  Surgeon: Lyn RecordsSmith, Henry W, MD;  Location: [facility] INVASIVE CV LAB;  Service: Cardiovascular;  Laterality: N/A;   IR THORACENTESIS ASP PLEURAL SPACE W/IMG GUIDE  05/03/2020   IR THORACENTESIS ASP PLEURAL SPACE W/IMG GUIDE  05/16/2020   LEFT HEART CATH AND CORONARY ANGIOGRAPHY N/A 03/29/2020   Procedure: LEFT HEART CATH AND CORONARY ANGIOGRAPHY;  Surgeon: Lyn RecordsSmith, Henry W, MD;  Location: [facility] INVAS[facility] LAB;  Service: Cardiovascular;  Laterality: N/A;   RADIAL ARTERY HARVEST Left 04/03/2020   Procedure: LEFT RADIAL ARTERY HARVEST;  Surgeon: Linden DolinAtkins, Broadus Z, MD;  Location: [facility] OR;  Service: Open Heart Surgery;  Laterality: Left;   TEE WITHOUT CARDIOVERSION N/A 04/03/2020   Procedure: TRANSESOPHAGEAL ECHOCARDIOGRAM (TEE);  Surgeon: Linden DolinAtkins, Broadus Z, MD;  [facility];  Service: Open Heart Surgery;  Laterality: N/A;   UPPER EXTREMITY ANGIOGRAPHY  03/29/2020   Procedure: Upper Extremity Angiography;  Surgeon: Lyn RecordsSmith, Henry W, MD;  [facility];  Service: Cardiovascular;;  rt.  arm    ROS: Review of Systems Negative  except as stated above  PHYSICAL EXAM: BP (!) 146/95 (BP Location: Right Arm, Patient Position: Sitting, Cuff Size: Normal)   Pulse 73   Temp 97.9 F (36.6 C)   Resp 15   Ht 5' 10.98" (1.803 m)   Wt 164 lb 12.8 oz (74.8 kg)   SpO2 95%   BMI 23.00 kg/m   Physical Exam General appearance - alert, well appearing, and in no distress, oriented to person, place, and time, and normal appearing weight Mental status - alert, oriented to person, place, and time, normal mood, behavior, speech, dress, motor activity, and thought processes Eyes - pupils equal and reactive, extraocular  eye movements intact Ears - bilateral TM's and external ear canals normal Nose - normal and patent, no erythema, discharge or polyps Mouth - mucous membranes moist, pharynx normal without lesions Neck - supple, no significant adenopathy Lymphatics - no palpable lymphadenopathy, no hepatosplenomegaly Chest - clear to auscultation, no wheezes, rales or rhonchi, symmetric air entry, no tachypnea, retractions or cyanosis Heart - normal rate, regular rhythm, normal S1, S2, no murmurs, rubs, clicks or gallops Abdomen - soft, nontender, nondistended, no masses or organomegaly GU Male - patient declined examination  Back exam - full range of motion, no tenderness, palpable spasm or pain on motion Neurological - alert, oriented, normal speech, no focal findings or movement disorder noted, neck supple without rigidity, cranial nerves II through XII intact, DTR's normal and symmetric, motor and sensory grossly normal bilaterally, normal muscle tone, no tremors, strength 5/5 Musculoskeletal - no joint tenderness, deformity or swelling, no muscular tenderness noted, full range of motion without pain Extremities - peripheral pulses normal, no pedal edema, no clubbing or cyanosis Skin - normal coloration and turgor, no rashes, no suspicious skin lesions noted   ASSESSMENT AND PLAN: 1. Annual physical exam: - Counseled on 150  minutes of exercise per week as tolerated, healthy eating (including decreased daily intake of saturated fats, cholesterol, added sugars, sodium), STI prevention, and routine healthcare maintenance.  2. Screening for metabolic disorder: - CMP to check kidney function, liver function, and electrolyte balance.  - Comprehensive metabolic panel  3. Screening for deficiency anemia: - CBC to screen for anemia. - CBC  4. Screening cholesterol level: - Lipid panel to screen for high cholesterol.  - Lipid panel  5. Thyroid disorder screen: - TSH to check thyroid function.  - TSH  6. Need for hepatitis C screening test: - Hepatitis C antibody to screen for hepatitis C.  - Hepatitis C Antibody  7. Colon cancer screening: - Patient declined. Reports he had colonoscopy in 2021 in [place].  8. Type 2 diabetes mellitus with hyperosmolarity without coma, without long-term current use of insulin (HCC): - Hemoglobin A1c today not at goal at 7.8%, goal < 7%. This is increased from previous hemoglobin A1c of 6.8% on 03/28/2020. Next hemoglobin A1c due September 20222.  - Pharmacological options are limited as the patient has congestive heart failure. - Increase Empagliflozin from 10 mg daily to 25 mg daily. Patient reports he receives medication for free from a program. He reports he does not know the name of the program and does not have any contact information for the same.  -Follow-up with clinical pharmacist in 4 weeks for diabetes checkup. Write your home blood sugar results down each day and bring those results to your appointment along with your home glucose monitor. Medications may be revised at that time if needed. - Referral to Endocrinology for further evaluation and management.  - Hemoglobin A1c - POCT glycosylated hemoglobin (Hb A1C) - empagliflozin (JARDIANCE) 25 MG TABS tablet; Take 1 tablet (25 mg total) by mouth daily before breakfast.  Dispense: 30 tablet; Refill: 0 - Ambulatory  referral to Endocrinology  9. Diabetic eye exam [facility]): - Patient reports he is up-to-date and has an follow-up appointment scheduled on next week.  - Keep appointments as scheduled.   10. HFrEF (heart failure with reduced ejection fraction) (HCC): 11. Coronary artery disease involving native coronary artery of native heart without angina pectoris: 12. Essential hypertension: 13. Pure hypercholesterolemia: - Keep appointments with Cardiology as scheduled.   14. Stage 3a chronic kidney  disease [facility]): - Referral to Nephrology for further evaluation and management.  - Ambulatory referral to Nephrology  15. Erectile dysfunction, unspecified erectile dysfunction type: - Patient request for referral for consideration of penile implant.  - Referral to Urology for further evaluation and management. - Ambulatory referral to Urology   Patient was given the opportunity to ask questions.  Patient verbalized understanding of the plan and was able to repeat key elements of the plan. Patient was given clear instructions to go to Emergency Department or return to medical center if symptoms don't improve, worsen, or new problems develop.The patient verbalized understanding.   Orders Placed This Encounter  Procedures   Hepatitis C Antibody   CBC   Comprehensive metabolic panel   Lipid panel   TSH   Hemoglobin A1c   Ambulatory referral to Endocrinology   POCT glycosylated hemoglobin (Hb A1C)    Requested Prescriptions   Signed Prescriptions Disp Refills   empagliflozin (JARDIANCE) 25 MG TABS tablet 30 tablet 0    Sig: Take 1 tablet (25 mg total) by mouth daily before breakfast.    Return in about 4 weeks (around 08/30/2020) for Follow-Up diabetes Luke at [facility].  Rema Fendt, NP

## 2020-08-02 ENCOUNTER — Other Ambulatory Visit: Payer: Self-pay

## 2020-08-02 ENCOUNTER — Ambulatory Visit (INDEPENDENT_AMBULATORY_CARE_PROVIDER_SITE_OTHER): Payer: Self-pay | Admitting: Family

## 2020-08-02 ENCOUNTER — Other Ambulatory Visit (HOSPITAL_COMMUNITY): Payer: Self-pay

## 2020-08-02 ENCOUNTER — Encounter: Payer: Self-pay | Admitting: Family

## 2020-08-02 VITALS — BP 146/95 | HR 73 | Temp 97.9°F | Resp 15 | Ht 70.98 in | Wt 164.8 lb

## 2020-08-02 DIAGNOSIS — I10 Essential (primary) hypertension: Secondary | ICD-10-CM

## 2020-08-02 DIAGNOSIS — Z1329 Encounter for screening for other suspected endocrine disorder: Secondary | ICD-10-CM

## 2020-08-02 DIAGNOSIS — N529 Male erectile dysfunction, unspecified: Secondary | ICD-10-CM

## 2020-08-02 DIAGNOSIS — Z0000 Encounter for general adult medical examination without abnormal findings: Secondary | ICD-10-CM

## 2020-08-02 DIAGNOSIS — Z1211 Encounter for screening for malignant neoplasm of colon: Secondary | ICD-10-CM

## 2020-08-02 DIAGNOSIS — E7800 Pure hypercholesterolemia, unspecified: Secondary | ICD-10-CM

## 2020-08-02 DIAGNOSIS — Z130 Encounter for screening for diseases of the blood and blood-forming organs and certain disorders involving the immune mechanism: Secondary | ICD-10-CM

## 2020-08-02 DIAGNOSIS — I2510 Atherosclerotic heart disease of native coronary artery without angina pectoris: Secondary | ICD-10-CM

## 2020-08-02 DIAGNOSIS — I5020 Unspecified systolic (congestive) heart failure: Secondary | ICD-10-CM

## 2020-08-02 DIAGNOSIS — Z13220 Encounter for screening for lipoid disorders: Secondary | ICD-10-CM

## 2020-08-02 DIAGNOSIS — Z0100 Encounter for examination of eyes and vision without abnormal findings: Secondary | ICD-10-CM

## 2020-08-02 DIAGNOSIS — N1831 Chronic kidney disease, stage 3a: Secondary | ICD-10-CM

## 2020-08-02 DIAGNOSIS — E1100 Type 2 diabetes mellitus with hyperosmolarity without nonketotic hyperglycemic-hyperosmolar coma (NKHHC): Secondary | ICD-10-CM

## 2020-08-02 DIAGNOSIS — E119 Type 2 diabetes mellitus without complications: Secondary | ICD-10-CM

## 2020-08-02 DIAGNOSIS — Z1159 Encounter for screening for other viral diseases: Secondary | ICD-10-CM

## 2020-08-02 DIAGNOSIS — Z13228 Encounter for screening for other metabolic disorders: Secondary | ICD-10-CM

## 2020-08-02 LAB — POCT GLYCOSYLATED HEMOGLOBIN (HGB A1C): Hemoglobin A1C: 7.8 % — AB (ref 4.0–5.6)

## 2020-08-02 MED ORDER — EMPAGLIFLOZIN 25 MG PO TABS
25.0000 mg | ORAL_TABLET | Freq: Every day | ORAL | 0 refills | Status: DC
  Filled 2020-08-02: qty 90, 90d supply, fill #0

## 2020-08-02 MED ORDER — EMPAGLIFLOZIN 25 MG PO TABS
25.0000 mg | ORAL_TABLET | Freq: Every day | ORAL | 0 refills | Status: DC
  Filled 2020-08-02: qty 30, 30d supply, fill #0

## 2020-08-02 NOTE — Patient Instructions (Signed)
Preventive Care 40-64 Years Old, Male Preventive care refers to lifestyle choices and visits with your health care provider that can promote health and wellness. This includes: A yearly physical exam. This is also called an annual wellness visit. Regular dental and eye exams. Immunizations. Screening for certain conditions. Healthy lifestyle choices, such as: Eating a healthy diet. Getting regular exercise. Not using drugs or products that contain nicotine and tobacco. Limiting alcohol use. What can I expect for my preventive care visit? Physical exam Your health care provider will check your: Height and weight. These may be used to calculate your BMI (body mass index). BMI is a measurement that tells if you are at a healthy weight. Heart rate and blood pressure. Body temperature. Skin for abnormal spots. Counseling Your health care provider may ask you questions about your: Past medical problems. Family's medical history. Alcohol, tobacco, and drug use. Emotional well-being. Home life and relationship well-being. Sexual activity. Diet, exercise, and sleep habits. Work and work environment. Access to firearms. What immunizations do I need?  Vaccines are usually given at various ages, according to a schedule. Your health care provider will recommend vaccines for you based on your age, medicalhistory, and lifestyle or other factors, such as travel or where you work. What tests do I need? Blood tests Lipid and cholesterol levels. These may be checked every 5 years, or more often if you are over 50 years old. Hepatitis C test. Hepatitis B test. Screening Lung cancer screening. You may have this screening every year starting at age 55 if you have a 30-pack-year history of smoking and currently smoke or have quit within the past 15 years. Prostate cancer screening. Recommendations will vary depending on your family history and other risks. Genital exam to check for testicular cancer  or hernias. Colorectal cancer screening. All adults should have this screening starting at age 50 and continuing until age 75. Your health care provider may recommend screening at age 45 if you are at increased risk. You will have tests every 1-10 years, depending on your results and the type of screening test. Diabetes screening. This is done by checking your blood sugar (glucose) after you have not eaten for a while (fasting). You may have this done every 1-3 years. STD (sexually transmitted disease) testing, if you are at risk. Follow these instructions at home: Eating and drinking  Eat a diet that includes fresh fruits and vegetables, whole grains, lean protein, and low-fat dairy products. Take vitamin and mineral supplements as recommended by your health care provider. Do not drink alcohol if your health care provider tells you not to drink. If you drink alcohol: Limit how much you have to 0-2 drinks a day. Be aware of how much alcohol is in your drink. In the [place] one drink equals one 12 oz bottle of beer (355 mL), one 5 oz glass of wine (148 mL), or one 1 oz glass of hard liquor (44 mL).  Lifestyle Take daily care of your teeth and gums. Brush your teeth every morning and night with fluoride toothpaste. Floss one time each day. Stay active. Exercise for at least 30 minutes 5 or more days each week. Do not use any products that contain nicotine or tobacco, such as cigarettes, e-cigarettes, and chewing tobacco. If you need help quitting, ask your health care provider. Do not use drugs. If you are sexually active, practice safe sex. Use a condom or other form of protection to prevent STIs (sexually transmitted infections). If told by   your health care provider, take low-dose aspirin daily starting at age 50. Find healthy ways to cope with stress, such as: Meditation, yoga, or listening to music. Journaling. Talking to a trusted person. Spending time with friends and  family. Safety Always wear your seat belt while driving or riding in a vehicle. Do not drive: If you have been drinking alcohol. Do not ride with someone who has been drinking. When you are tired or distracted. While texting. Wear a helmet and other protective equipment during sports activities. If you have firearms in your house, make sure you follow all gun safety procedures. What's next? Go to your health care provider once a year for an annual wellness visit. Ask your health care provider how often you should have your eyes and teeth checked. Stay up to date on all vaccines. This information is not intended to replace advice given to you by your health care provider. Make sure you discuss any questions you have with your healthcare provider. Document Revised: 11/08/2018 Document Reviewed: 02/03/2018 Elsevier Patient Education  2022 [employer] Inc.  

## 2020-08-02 NOTE — Progress Notes (Signed)
Pt presents for Physical Examination and diabetes f/u

## 2020-08-03 LAB — COMPREHENSIVE METABOLIC PANEL
ALT: 15 IU/L (ref 0–44)
AST: 14 IU/L (ref 0–40)
Albumin/Globulin Ratio: 1 — ABNORMAL LOW (ref 1.2–2.2)
Albumin: 4 g/dL (ref 3.8–4.8)
Alkaline Phosphatase: 90 IU/L (ref 44–121)
BUN/Creatinine Ratio: 19 (ref 10–24)
BUN: 31 mg/dL — ABNORMAL HIGH (ref 8–27)
Bilirubin Total: 0.3 mg/dL (ref 0.0–1.2)
CO2: 19 mmol/L — ABNORMAL LOW (ref 20–29)
Calcium: 9.5 mg/dL (ref 8.6–10.2)
Chloride: 104 mmol/L (ref 96–106)
Creatinine, Ser: 1.6 mg/dL — ABNORMAL HIGH (ref 0.76–1.27)
Globulin, Total: 4.2 g/dL (ref 1.5–4.5)
Glucose: 105 mg/dL — ABNORMAL HIGH (ref 65–99)
Potassium: 4.3 mmol/L (ref 3.5–5.2)
Sodium: 142 mmol/L (ref 134–144)
Total Protein: 8.2 g/dL (ref 6.0–8.5)
eGFR: 48 mL/min/{1.73_m2} — ABNORMAL LOW (ref >59)

## 2020-08-03 LAB — CBC
Hematocrit: 34.9 % — ABNORMAL LOW (ref 37.5–51.0)
Hemoglobin: 11.4 g/dL — ABNORMAL LOW (ref 13.0–17.7)
MCH: 29.1 pg (ref 26.6–33.0)
MCHC: 32.7 g/dL (ref 31.5–35.7)
MCV: 89 fL (ref 79–97)
Platelets: 237 10*3/uL (ref 150–450)
RBC: 3.92 x10E6/uL — ABNORMAL LOW (ref 4.14–5.80)
RDW: 15.4 % (ref 11.6–15.4)
WBC: 8 10*3/uL (ref 3.4–10.8)

## 2020-08-03 LAB — LIPID PANEL
Chol/HDL Ratio: 1.9 ratio (ref 0.0–5.0)
Cholesterol, Total: 100 mg/dL (ref 100–199)
HDL: 52 mg/dL
LDL Chol Calc (NIH): 35 mg/dL (ref 0–99)
Triglycerides: 55 mg/dL (ref 0–149)
VLDL Cholesterol Cal: 13 mg/dL (ref 5–40)

## 2020-08-03 LAB — TSH: TSH: 0.708 u[IU]/mL (ref 0.450–4.500)

## 2020-08-03 LAB — HEMOGLOBIN A1C
Est. average glucose Bld gHb Est-mCnc: 186 mg/dL
Hgb A1c MFr Bld: 8.1 % — ABNORMAL HIGH (ref 4.8–5.6)

## 2020-08-03 LAB — HEPATITIS C ANTIBODY: Hep C Virus Ab: <0.1 s/co ratio (ref 0.0–0.9)

## 2020-08-03 NOTE — Progress Notes (Signed)
Liver function normal.   Thyroid function normal.   Cholesterol normal. Continue Atorvastatin as prescribed.   Hepatitis C negative.   CBC improved since 2 months ago. Platelets back to normal.    Diabetes discussed in office.   Kidney function discussed in office and referred to Nephrology.

## 2020-08-08 ENCOUNTER — Ambulatory Visit: Payer: Self-pay | Admitting: Urology

## 2020-08-13 ENCOUNTER — Other Ambulatory Visit: Payer: Self-pay | Admitting: Family

## 2020-08-13 DIAGNOSIS — N529 Male erectile dysfunction, unspecified: Secondary | ICD-10-CM

## 2020-08-13 NOTE — Progress Notes (Signed)
Per patient request referral to Urology updated. Provider did specify patient prefers the local [place] area.

## 2020-08-20 ENCOUNTER — Other Ambulatory Visit: Payer: Self-pay

## 2020-08-20 ENCOUNTER — Ambulatory Visit (HOSPITAL_COMMUNITY): Payer: Medicaid Other | Attending: Physician Assistant

## 2020-08-20 DIAGNOSIS — N1831 Chronic kidney disease, stage 3a: Secondary | ICD-10-CM

## 2020-08-20 DIAGNOSIS — I5020 Unspecified systolic (congestive) heart failure: Secondary | ICD-10-CM | POA: Diagnosis present

## 2020-08-20 LAB — ECHOCARDIOGRAM LIMITED
Area-P 1/2: 4.44 cm2
S' Lateral: 3.55 cm

## 2020-08-21 ENCOUNTER — Encounter: Payer: Self-pay | Admitting: Physician Assistant

## 2020-08-21 DIAGNOSIS — I5020 Unspecified systolic (congestive) heart failure: Secondary | ICD-10-CM | POA: Insufficient documentation

## 2020-08-22 ENCOUNTER — Other Ambulatory Visit (HOSPITAL_COMMUNITY): Payer: Self-pay

## 2020-09-02 NOTE — Progress Notes (Deleted)
Cardiology Office Note:    Date:  09/02/2020   ID:  Curtis Watkins, DOB 05/29/1955, MRN [id number]  PCP:  Rema Fendt, NP   [facility] Providers Cardiologist:  Meriam Sprague, MD Cardiology APP:  Kennon Rounds {    Referring MD: Mikki Santee Key, *    History of Present Illness:    Curtis Watkins is a 65 y.o. male with a hx of with history of NSTEMI s/p CABG in 03/2020 (LIMA-->LAD/diag, L radial-->OM1/OM3), HFrEF with LVEF 25-30%, DMII, carotid artery disease, HTN and HLD who presents to clinic for follow-up.  The patient initially presented in 03/2020 with LE edema and SOB. TTE at that time showed LVEF 40-45% with hypokineis of the distal septal, distal inferior, distal lateral walls; and akinesis at the apex. He subsequently underwent LHC which revealed severe two-vessel disease involving the LAD and circumflex. He underwent 4vCABG on 04/03/20.  His postoperative course was relatively uncomplicated and he was discharged on the 13th.   In early 04/2020 he underwent second and third toe amputation for a known diabetic foot ulcer.  He was also noted to have a large left pleural effusion at his surgical follow-up in 04/2020, and underwent thoracentesis on the 05/16/20 with over 1-1/2 L of serosanguineous fluid removal.  The patient continues to have SOB and volume overload after discharge prompting admission 05/24/20-05/28/20.  During admission TTE demonstrated worsening LV function with an EF of 25-30. He was diuresed 20lbs to 158 with improvement in symptoms.  He also underwent repeat R thoracentesis with 1500cc removed. His GDMT was optimized and he was discharged on losartan 12.5mg , metoprolol succinate 12.5mg , spiro 12.5mg , and jardiance 10mg  and lasix 40mg .  Cr increased slightly during admission 1.30>1.55.    Last saw on 07/02/20 where he was doing much better.  Today,  Past Medical History:  Diagnosis Date   Carotid artery disease (HCC)    04/01/20:  60-79% RICA stenosis, 1-39% LICA stenosis   Coronary artery disease    Diabetes mellitus without complication (HCC)    HFrEF (heart failure with reduced ejection fraction) (HCC)    Echo 4/22: EF 25-30, global HK, small effusion without tamponade // Echo 6/22: EF 45, global HK, mild MR, RVSP 31.9   Hypertension    Ischemic cardiomyopathy    PAD (peripheral artery disease) (HCC)    occluded right axillary artery 03/29/20     Past Surgical History:  Procedure Laterality Date   AMPUTATION Right 04/24/2020   Procedure: RIGHT 2ND AND 3RD TOE AMPUTATION;  Surgeon: 05/27/20, MD;  Location: [facility] OR;  Service: Orthopedics;  Laterality: Right;   CENTRAL VENOUS CATHETER INSERTION Left 04/03/2020   Procedure: INSERTION OF FEMORAL ARTERIAL LINE;  Surgeon: Nadara Mustard, MD;  Location: [facility] OR;  Service: Open Heart Surgery;  Laterality: Left;   CORONARY ARTERY BYPASS GRAFT N/A 04/03/2020   Procedure: CORONARY ARTERY BYPASS GRAFTING (CABG) TIMES FOUR USING LEFT INTERNAL MAMMARY ARTERY AND LEFT RADIAL ARTERY.;  Surgeon: Linden Dolin, MD;  Location: [facility] OR;  Service: Open Heart Surgery;  Laterality: N/A;   CORONARY PRESSURE WIRE/FFR WITH 3D MAPPING N/A 03/29/2020   Procedure: Coronary Pressure Wire/FFR w/3D Mapping;  Surgeon: Linden Dolin, MD;  Location: [facility] INVASIVE CV LAB;  Service: Cardiovascular;  Laterality: N/A;   IR THORACENTESIS ASP PLEURAL SPACE W/IMG GUIDE  05/03/2020   IR THORACENTESIS ASP PLEURAL SPACE W/IMG GUIDE  05/16/2020   LEFT HEART CATH AND CORONARY ANGIOGRAPHY N/A  03/29/2020   Procedure: LEFT HEART CATH AND CORONARY ANGIOGRAPHY;  Surgeon: Lyn Records, MD;  Location[facility] INVASIVE CV LAB;  Service: Cardiovascular;  Laterality: N/A;   RADIAL ARTERY HARVEST Left 04/03/2020   Procedure: LEFT RADIAL ARTERY HARVEST;  Surgeon: Linden Dolin, MD;  Location: [facility] OR;  Service: Open Heart Surgery;  Laterality: Left;   TEE WITHOUT CARDIOVERSION N/A 04/03/2020   Procedure: TRANSESOPHAGEAL  ECHOCARDIOGRAM (TEE);  Surgeon: Linden Dolin, MD;  Location: [facility] OR;  Service: Open Heart Surgery;  Laterality: N/A;   UPPER EXTREMITY ANGIOGRAPHY  03/29/2020   Procedure: Upper Extremity Angiography;  Surgeon: Lyn Records, MD;  Location: [facility] INVASIVE CV LAB;  Service: Cardiovascular;;  rt.  arm    Current Medications: No outpatient medications have been marked as taking for the 09/05/20 encounter (Appointment) with Meriam Sprague, MD.     Allergies:   Patient has no known allergies.   Social History   Socioeconomic History   Marital status: Significant Other    Spouse name: Not on file   Number of children: 3   Years of education: Not on file   Highest education level: High school graduate  Occupational History   Occupation: retired d/t medical conditions  Tobacco Use   Smoking status: Some Days    Packs/day: 0.25    Years: 2.00    Pack years: 0.50    Types: Cigarettes   Smokeless tobacco: Never  Vaping Use   Vaping Use: Never used  Substance and Sexual Activity   Alcohol use: Not Currently    Alcohol/week: 1.0 standard drink    Types: 1 Cans of beer per week    Comment: occassionally   Drug use: Not Currently    Frequency: 3.0 times per week    Types: Marijuana   Sexual activity: Not Currently  Other Topics Concern   Not on file  Social History Narrative   Not on file   Social Determinants of Health   Financial Resource Strain: High Risk   Difficulty of Paying Living Expenses: Hard  Food Insecurity: No Food Insecurity   Worried About Running Out of Food in the Last Year: Never true   Ran Out of Food in the Last Year: Never true  Transportation Needs: Unmet Transportation Needs   Lack of Transportation (Medical): Yes   Lack of Transportation (Non-Medical): No  Physical Activity: Inactive   Days of Exercise per Week: 0 days   Minutes of Exercise per Session: 0 min  Stress: Not on file  Social Connections: Not on file     Family History: The  patient's ***family history is not on file.  ROS:   Please see the history of present illness.    *** All other systems reviewed and are negative.  EKGs/Labs/Other Studies Reviewed:    The following studies were reviewed today: Limited Echocardiogram 05/26/20 EF 25-30, global HK, small effusion without tamponade   Pre-CABG Dopplers 04/01/20 R 60-79; L 1-39   Echocardiogram 03/28/20 EF 40-45, mild LVH, GRII DD normal RVSF, mild LAE, mild MR   LEFT HEART CATH 03/29/2020 Narrative  Diabetic with severe two-vessel disease including the LAD and complex disease in the circumflex.  Ischemic cardiomyopathy with LVEF 35 to 40%.  LVEDP is less than 10 mmHg.  Left main is widely patent  Right coronary is widely patent but contains diffuse atherosclerosis without focal narrowing.  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 RECOMMENDATIONS:  T CTS evaluation to consider coronary artery bypass grafting  with LIMA to the LAD.  SVG to circumflex or arterial graft assuming that the left radial has flow..    EKG:  EKG is *** ordered today.  The ekg ordered today demonstrates ***  Recent Labs: 04/04/2020: Magnesium 2.6 06/07/2020: B Natriuretic Peptide 1,434.7 08/02/2020: ALT 15; BUN 31; Creatinine, Ser 1.60; Hemoglobin 11.4; Platelets 237; Potassium 4.3; Sodium 142; TSH 0.708  Recent Lipid Panel    Component Value Date/Time   CHOL 100 08/02/2020 1146   TRIG 55 08/02/2020 1146   HDL 52 08/02/2020 1146   CHOLHDL 1.9 08/02/2020 1146   CHOLHDL 2.8 03/29/2020 0600   VLDL 12 03/29/2020 0600   LDLCALC 35 08/02/2020 1146     Risk Assessment/Calculations:   {Does this patient have ATRIAL FIBRILLATION?:[phone]}       Physical Exam:    VS:  There were no vitals taken for this visit.    Wt Readings from Last 3 Encounters:  08/02/20 164 lb 12.8 oz (74.8 kg)  07/02/20 165 lb 3.2 oz (74.9 kg)  06/20/20 165 lb (74.8 kg)     GEN: *** Well nourished, well developed in no acute  distress HEENT: Normal NECK: No JVD; No carotid bruits LYMPHATICS: No lymphadenopathy CARDIAC: ***RRR, no murmurs, rubs, gallops RESPIRATORY:  Clear to auscultation without rales, wheezing or rhonchi  ABDOMEN: Soft, non-tender, non-distended MUSCULOSKELETAL:  No edema; No deformity  SKIN: Warm and dry NEUROLOGIC:  Alert and oriented x 3 PSYCHIATRIC:  Normal affect   ASSESSMENT:    No diagnosis found. PLAN:    In order of problems listed above:  #Chronic systolic heart failure, Ischemic Cardiomyopathy:  Patient with history of CABG x4 in 03/2020 with EF at 40-45% on 05/2020 dropped to 25-30% in 05/2020, however, POC TTE in HF clinic revealed  EF improved back to 40-45%, IVC collapsible with >50% respiratory variability. Currently doing better with NYHA class II symptoms.  -Continue metop succinate 37.5mg  daily -Continue lasix to 20mg  daily -Continue losartan 12.5 mg daily -Continue aldactone 12.5 mg daily -Continue Jardiance 10mg  daily -Low Na diet -Monitor daily weights   #Recurrent Pleural Effusions: Have been present since CABG in February requiring thoracentesis x2 (once in 04/2020 in the outpatient setting by IR and also in 05/2020 during admission by PCCM). Last CXR with persistent effusions. -Management per CT surgery       #CAD status post CABG  Underwent 4v CABG in 03/2020 with LIMA-->LAD/diag, L radial-->OM1/OM3. Now doing well without anginal symptoms.  -Continue ASA 81mg  daily -Continue plavix 75mg  daily -Continue metop succinate 37.5mg  daily -Continue losartan 12.5 mg daily   #Tobacco Use Disorder: Working on quitting.  -Continue smoking cessation efforts -Continue nicotine patches    #Hypertension  -Continue metop succinate 37.5mg  daily -Continue losartan 12.5 mg daily -Continue aldactone 12.5 mg daily    #T2DM:  -Continue Jardiance 10mg  daily   -last HbA1c 6.8    #CKD IIIa:  -Trend   {Are you ordering a CV Procedure (e.g. stress test, cath, DCCV,  TEE, etc)?   Press F2        :06/2020    Medication Adjustments/Labs and Tests Ordered: Current medicines are reviewed at length with the patient today.  Concerns regarding medicines are outlined above.  No orders of the defined types were placed in this encounter.  No orders of the defined types were placed in this encounter.   There are no Patient Instructions on file for this visit.   Signed, 04/2020, MD  09/02/2020 7:18 AM  [facility]

## 2020-09-03 ENCOUNTER — Ambulatory Visit: Payer: Self-pay | Attending: Family | Admitting: Pharmacist

## 2020-09-03 ENCOUNTER — Encounter: Payer: Self-pay | Admitting: Pharmacist

## 2020-09-03 ENCOUNTER — Other Ambulatory Visit: Payer: Self-pay

## 2020-09-03 DIAGNOSIS — E1100 Type 2 diabetes mellitus with hyperosmolarity without nonketotic hyperglycemic-hyperosmolar coma (NKHHC): Secondary | ICD-10-CM

## 2020-09-03 LAB — GLUCOSE, POCT (MANUAL RESULT ENTRY): POC Glucose: 166 mg/dl — AB (ref 70–99)

## 2020-09-03 MED ORDER — OZEMPIC (0.25 OR 0.5 MG/DOSE) 2 MG/1.5ML ~~LOC~~ SOPN
0.2500 mg | PEN_INJECTOR | SUBCUTANEOUS | 0 refills | Status: DC
  Filled 2020-09-03: qty 1.5, 56d supply, fill #0

## 2020-09-03 NOTE — Progress Notes (Signed)
S:    PCP Amy Minette Brine No chief complaint on file.  Patient arrives in good spirits.  Presents for diabetes evaluation, education, and management Patient was referred and last seen by Primary Care Provider on 08/02/2020.    Patient reports Diabetes was diagnosed in 2020. He has taken metformin before but this was stopped d/t poor perfusion and kidney function during his hospital stay in April. No only takes Jardiance. Denies any hx of pancreatitis. No CTA in our system. Denies any hx of thyroid cancer. He has known heart disease. He has CAD with a hx of CABG x4. He was hospitalized from 05/24/2020 to 05/28/2020 for acute congestive heart failure. Echo showed EF of 25-30% at that time. More recent echocardiogram results reveal an improved EF of 40% (08/20/2020). He also has CKD with most recent eGFR of 48 (08/02/2020).   Family/Social History:  -Fhx: none -Tobacco: some day smoker (0.25 PPD average) -Alcohol: no current use  Insurance coverage/medication affordability: self pay. Insurance coverage starts 11/23/2020.  Medication adherence reported .   Current diabetes medications include: Jardiance 25 mg daily  Current hypertension medications include: losartan 25 mg daily, metoprolol succinate 37.5 mg daily, spironolactone 25 mg daily, furosemide for fluid control/symptoms Current hyperlipidemia medications include: atorvastatin 80 mg daily  Patient denies hypoglycemic events.  Patient reported dietary habits:  -Admits to having a sweet tooth but is trying to limit sweets  Patient-reported exercise habits:  -None outside of house work and outdoor chores   Patient denies nocturia (nighttime urination).  Patient denies in changes from baseline neuropathy (nerve pain). Patient denies visual changes since hospitalization. Patient reports self foot exams.     O:  POCT: 166  Lab Results  Component Value Date   HGBA1C 8.1 (H) 08/02/2020   There were no vitals filed for this  visit.  Lipid Panel     Component Value Date/Time   CHOL 100 08/02/2020 1146   TRIG 55 08/02/2020 1146   HDL 52 08/02/2020 1146   CHOLHDL 1.9 08/02/2020 1146   CHOLHDL 2.8 03/29/2020 0600   VLDL 12 03/29/2020 0600   LDLCALC 35 08/02/2020 1146    Home fasting blood sugars: no meter. Gives range 130-180   Clinical Atherosclerotic Cardiovascular Disease (ASCVD): Yes  The ASCVD Risk score Mikey Bussing DC Jr., et al., 2013) failed to calculate for the following reasons:   The patient has a prior MI or stroke diagnosis   A/P: Diabetes longstanding currently uncontrolled. Patient is able to verbalize appropriate hypoglycemia management plan. Medication adherence appears optimal.   Emphasized to patient the need for tighter DM control given his medical history. We have options. I told him we should hold off on metformin for now. His EF has improved but kidney function is borderline. Will not add this back at this time. He sees Endocrine in September.   With his CKD, glipizide is the safest option from a SU standpoint. However, he could still be at an increased risk of hypoglycemia.   Discussed the option of the lowest dose of Ozempic weekly to improve control. He would get added ASCVD benefit with minimal hypoglycemia risk. His weight is a concern, but we could start him at 0.25 mg weekly to mitigate potential weight loss.   Finally, we could use insulin. Pt wishes to hold off on adding insulin at this time.   After consideration of all these, we decided to try low-dose Ozempic. I will follow-up in 2-3 weeks. Further optimization to be deferred to  Endocrinology.   -Continued Jardiance 25 mg daily. -Add Ozempic 0.25 mg once weekly.  -Patient was educated on the use of the Ozempic pen. Reviewed necessary supplies and operation of the pen.All questions and concerns were addressed. -Discussed potential side effects in addition of my concern for weight loss.Pt wishes to try lowest dose. Weight loss  could still occu166r but at a lower amount with the 0.25 mg dose vs 0.74m or higher. -Extensively discussed pathophysiology of diabetes, recommended lifestyle interventions, dietary effects on blood sugar control -Counseled on s/sx of and management of hypoglycemia -Next A1C anticipated 10/2020.   ASCVD risk - secondary prevention in patient with diabetes. Last LDL is well controlled. Encouraged compliance with his high intensity statin.  -Continued atorvastatin 80 mg.   Written patient instructions provided.  Total time in face to face counseling 30 minutes.   Follow up Pharmacist Clinic Visit in 2-3 weeks.   LBenard Halsted PharmD, BPara March [phone]

## 2020-09-04 NOTE — Progress Notes (Signed)
Cardiology Office Note:    Date:  09/05/2020   ID:  Curtis Watkins, DOB 05-02-1955, MRN [id number]  PCP:  Rema Fendt, NP   [facility] Providers Cardiologist:  Meriam Sprague, MD Cardiology APP:  Kennon Rounds  {    Referring MD: Mikki Santee Key, *    History of Present Illness:    Curtis Watkins is a 65 y.o. male with a hx of with history of NSTEMI s/p CABG in 03/2020 (LIMA-->LAD/diag, L radial-->OM1/OM3), HFrEF with LVEF 25-30%, DMII, carotid artery disease, HTN and HLD who presents to clinic for follow-up.  The patient initially presented in 03/2020 with LE edema and SOB. TTE at that time showed LVEF 40-45% with hypokineis of the distal septal, distal inferior, distal lateral walls; and akinesis at the apex. He subsequently underwent LHC which revealed severe two-vessel disease involving the LAD and circumflex. He underwent 4vCABG on 04/03/20.  His postoperative course was relatively uncomplicated and he was discharged on the 13th.   In early 04/2020 he underwent second and third toe amputation for a known diabetic foot ulcer.  He was also noted to have a large left pleural effusion at his surgical follow-up in 04/2020, and underwent thoracentesis on the 05/16/20 with over 1-1/2 L of serosanguineous fluid removal.  The patient continues to have SOB and volume overload after discharge prompting admission 05/24/20-05/28/20.  During admission TTE demonstrated worsening LV function with an EF of 25-30. He was diuresed 20lbs to 158 with improvement in symptoms.  He also underwent repeat R thoracentesis with 1500cc removed. His GDMT was optimized and he was discharged on losartan 12.5mg , metoprolol succinate 12.5mg , spiro 12.5mg , and jardiance 10mg  and lasix 40mg .  Cr increased slightly during admission 1.30>1.55.    Last saw on 07/02/20 where he was doing much better.  Today, patient has been feeling good overall. He says when he feels warm/hot, he has  occasional lightheadedness. Patient denies LE Edema, shortness of breath and chest pains. He has been taking all his prescribed medication with no complications  He use to smoke only 2 cigarettes a day with having the nicotine patch. He says the nicotine patch caused rashes so he stopped using.However he currently smokes a pack a day. Of note, he started back smoking a year ago after not smoking for 35 years.  ECHO on 03/2020, EF 45%.  Patient denies abdominal pain, cramping, diarrhea, palpitations, nausea, headaches, syncope,orthopnea or PND.   Past Medical History:  Diagnosis Date   Carotid artery disease (HCC)    04/01/20: 60-79% RICA stenosis, 1-39% LICA stenosis   Coronary artery disease    Diabetes mellitus without complication (HCC)    HFrEF (heart failure with reduced ejection fraction) (HCC)    Echo 4/22: EF 25-30, global HK, small effusion without tamponade // Echo 6/22: EF 45, global HK, mild MR, RVSP 31.9   Hypertension    Ischemic cardiomyopathy    PAD (peripheral artery disease) (HCC)    occluded right axillary artery 03/29/20     Past Surgical History:  Procedure Laterality Date   AMPUTATION Right 04/24/2020   Procedure: RIGHT 2ND AND 3RD TOE AMPUTATION;  Surgeon: 05/27/20, MD;  Location: [facility] OR;  Service: Orthopedics;  Laterality: Right;   CENTRAL VENOUS CATHETER INSERTION Left 04/03/2020   Procedure: INSERTION OF FEMORAL ARTERIAL LINE;  Surgeon: Nadara Mustard, MD;  Location: [facility] OR;  Service: Open Heart Surgery;  Laterality: Left;   CORONARY ARTERY BYPASS GRAFT  N/A 04/03/2020   Procedure: CORONARY ARTERY BYPASS GRAFTING (CABG) TIMES FOUR USING LEFT INTERNAL MAMMARY ARTERY AND LEFT RADIAL ARTERY.;  Surgeon: Linden DolinAtkins, Broadus Z, MD;  Location: [facility] OR;  Service: Open Heart Surgery;  Laterality: N/A;   CORONARY PRESSURE WIRE/FFR WITH 3D MAPPING N/A 03/29/2020   Procedure: Coronary Pressure Wire/FFR w/3D Mapping;  Surgeon: Lyn RecordsSmith, Henry W, MD;  Location: [facility] IN[facility];   Service: Cardiovascular;  Laterality: N/A;   IR THORACENTESIS ASP PLEURAL SPACE W/IMG GUIDE  05/03/2020   IR THORACENTESIS ASP PLEURAL SPACE W/IMG GUIDE  05/16/2020   LEFT HEART CATH AND CORONARY ANGIOGRAPHY N/A 03/29/2020   Procedure: LEFT HEART CATH AND CORONARY ANGIOGRAPHY;  Surgeon: Lyn RecordsSmith, Henry W, MD;  [facility];  Service: Cardiovascular;  Laterality: N/A;   RADIAL ARTERY HARVEST Left 04/03/2020   Procedure: LEFT RADIAL ARTERY HARVEST;  Surgeon: Linden DolinAtkins, Broadus Z, MD;  Location: [facility];  Service: Open Heart Surgery;  Laterality: Left;   TEE WITHOUT CARDIOVERSION N/A 04/03/2020   Procedure: TRANSESOPHAGEAL ECHOCARDIOGRAM (TEE);  Surgeon: Linden DolinAtkins, Broadus Z, MD;  [facility];  Service: Open Heart Surgery;  Laterality: N/A;   UPPER EXTREMITY ANGIOGRAPHY  03/29/2020   Procedure: Upper Extremity Angiography;  Surgeon: Lyn RecordsSmith, Henry W, MD;  [facility];  Service: Cardiovascular;;  rt.  arm    Current Medications: Current Meds  Medication Sig   aspirin 81 MG tablet Take 1 tablet (81 mg total) by mouth daily.   atorvastatin (LIPITOR) 80 MG tablet Take 1 tablet (80 mg total) by mouth daily.   brimonidine (ALPHAGAN) 0.2 % ophthalmic solution Place 1 drop into the right eye as needed.   clopidogrel (PLAVIX) 75 MG tablet Take 1 tablet (75 mg total) by mouth daily.   empagliflozin (JARDIANCE) 25 MG TABS tablet Take 1 tablet (25 mg total) by mouth daily before breakfast.   furosemide (LASIX) 20 MG tablet Take 1 tablet (20 mg total) by mouth daily.   losartan (COZAAR) 25 MG tablet Take 1/2 tablet (12.5 mg total) by mouth daily.   metoprolol succinate (TOPROL XL) 25 MG 24 hr tablet Take 1.5 tablets (37.5 mg total) by mouth daily.   OVER THE COUNTER MEDICATION Apply 1 application topically daily. Diabetic lotion   Semaglutide,0.25 or 0.5MG /DOS, (OZEMPIC, 0.25 OR 0.5 MG/DOSE,) 2 MG/1.5ML SOPN Inject 0.25 mg into the skin once a week.   spironolactone (ALDACTONE) 25 MG tablet  Take 1/2 tablet (12.5 mg total) by mouth daily.     Allergies:   Patient has no known allergies.   Social History   Socioeconomic History   Marital status: Significant Other    Spouse name: Not on file   Number of children: 3   Years of education: Not on file   Highest education level: High school graduate  Occupational History   Occupation: retired d/t medical conditions  Tobacco Use   Smoking status: Some Days    Packs/day: 0.25    Years: 2.00    Pack years: 0.50    Types: Cigarettes   Smokeless tobacco: Never  Vaping Use   Vaping Use: Never used  Substance and Sexual Activity   Alcohol use: Not Currently    Alcohol/week: 1.0 standard drink    Types: 1 Cans of beer per week    Comment: occassionally   Drug use: Not Currently    Frequency: 3.0 times per week    Types: Marijuana   Sexual activity: Not Currently  Other Topics Concern  Not on file  Social History Narrative   Not on file   Social Determinants of Health   Financial Resource Strain: High Risk   Difficulty of Paying Living Expenses: Hard  Food Insecurity: No Food Insecurity   Worried About Programme researcher, broadcasting/film/video in the Last Year: Never true   Ran Out of Food in the Last Year: Never true  Transportation Needs: Unmet Transportation Needs   Lack of Transportation (Medical): Yes   Lack of Transportation (Non-Medical): No  Physical Activity: Inactive   Days of Exercise per Week: 0 days   Minutes of Exercise per Session: 0 min  Stress: Not on file  Social Connections: Not on file     Family History: The patient's family history is not on file.  ROS:   Review of Systems  Constitutional:  Negative for chills, fever and malaise/fatigue.  HENT:  Negative for congestion and hearing loss.   Eyes:  Negative for blurred vision and redness.  Respiratory:  Negative for cough and shortness of breath.   Cardiovascular:  Negative for chest pain, palpitations, orthopnea, claudication, leg swelling and PND.   Gastrointestinal:  Negative for abdominal pain, blood in stool, nausea and vomiting.  Genitourinary:  Negative for dysuria and hematuria.  Musculoskeletal:  Negative for back pain, joint pain and neck pain.  Neurological:  Positive for dizziness. Negative for headaches.  Endo/Heme/Allergies:  Negative for polydipsia. Does not bruise/bleed easily.  Psychiatric/Behavioral:  Negative for depression. The patient does not have insomnia.     EKGs/Labs/Other Studies Reviewed:    The following studies were reviewed today:  LIMITED ECHO: 08/20/2020: IMPRESSIONS   1. Left ventricular ejection fraction, by estimation, is 45%. The left  ventricle has mildly decreased function. The left ventricle demonstrates  global hypokinesis. Left ventricular diastolic function could not be  evaluated.   2. The mitral valve is grossly normal. Mild mitral valve regurgitation.  No evidence of mitral stenosis.   3. The aortic valve is tricuspid. Aortic valve regurgitation is not  visualized.   4. There is normal pulmonary artery systolic pressure.   Comparison(s): A prior study was performed on 05/26/20. Prior images  reviewed side by side. LV function has improved.   Limited Echocardiogram 05/26/20 EF 25-30, global HK, small effusion without tamponade   Pre-CABG Dopplers 04/01/20 R 60-79; L 1-39   Echocardiogram 03/28/20 EF 40-45, mild LVH, GRII DD normal RVSF, mild LAE, mild MR   LEFT HEART CATH 03/29/2020 Narrative  Diabetic with severe two-vessel disease including the LAD and complex disease in the circumflex.  Ischemic cardiomyopathy with LVEF 35 to 40%.  LVEDP is less than 10 mmHg.  Left main is widely patent  Right coronary is widely patent but contains diffuse atherosclerosis without focal narrowing.  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 RECOMMENDATIONS:  T CTS evaluation to consider coronary artery bypass grafting with LIMA to the LAD.  SVG to circumflex or arterial graft assuming  that the left radial has flow..    EKG:   09/05/2020: No new tracing  Recent Labs: 04/04/2020: Magnesium 2.6 06/07/2020: B Natriuretic Peptide 1,434.7 08/02/2020: ALT 15; BUN 31; Creatinine, Ser 1.60; Hemoglobin 11.4; Platelets 237; Potassium 4.3; Sodium 142; TSH 0.708  Recent Lipid Panel    Component Value Date/Time   CHOL 100 08/02/2020 1146   TRIG 55 08/02/2020 1146   HDL 52 08/02/2020 1146   CHOLHDL 1.9 08/02/2020 1146   CHOLHDL 2.8 03/29/2020 0600   VLDL 12 03/29/2020 0600   LDLCALC 35  08/02/2020 1146         Physical Exam:    VS:  BP 122/80   Pulse 73   Ht  (1.803 m)   Wt 163 lb 9.6 oz (74.2 kg)   SpO2 99%   BMI 22.82 kg/m     Wt Readings from Last 3 Encounters:  09/05/20 163 lb 9.6 oz (74.2 kg)  08/02/20 164 lb 12.8 oz (74.8 kg)  07/02/20 165 lb 3.2 oz (74.9 kg)     GEN:  Well nourished, well developed in no acute distress HEENT: Normal NECK: No JVD; No carotid bruits CARDIAC: RRR,  2/6 systolic murmur. No rubs, gallops RESPIRATORY:  Clear to auscultation without rales, wheezing or rhonchi  ABDOMEN: Soft, non-tender, non-distended MUSCULOSKELETAL:  No edema; No deformity  SKIN: Warm and dry NEUROLOGIC:  Alert and oriented x 3 PSYCHIATRIC:  Normal affect   ASSESSMENT:    1. HFrEF (heart failure with reduced ejection fraction) (HCC)   2. Coronary artery disease involving native coronary artery of native heart without angina pectoris   3. Type 2 diabetes mellitus with hyperosmolarity without coma, without long-term current use of insulin (HCC)   4. Tobacco abuse   5. Stage 3a chronic kidney disease (HCC)   6. Essential hypertension   7. S/P CABG x 4   8. Pure hypercholesterolemia   9. Pleural effusion   10. Chronic combined systolic and diastolic heart failure (HCC)    PLAN:    In order of problems listed above:  #Chronic systolic heart failure, Ischemic Cardiomyopathy:  Patient with history of CABG x4 in 03/2020 with EF at 40-45% on 05/2020  dropped to 25-30% in 05/2020, however, limited TTE 08/20/20 with recovery back to 45%, IVC collapsible with >50% respiratory variability. Currently doing better with NYHA class I-II symptoms.  -Continue metop succinate 37.5mg  daily -Continue lasix  daily -Continue losartan 12.5 mg daily -Continue aldactone 12.5 mg daily -Continue Jardiance  daily -Low Na diet -Monitor daily weights   #Recurrent Pleural Effusions: Have been present since CABG in February requiring thoracentesis x2 (once in 04/2020 in the outpatient setting by IR and also in 05/2020 during admission by PCCM). Last CXR with persistent effusions. Currently no shortness of breath or orthopnea. Weights stable.  -Continue lasix  daily       #CAD status post CABG  Underwent 4v CABG in 03/2020 with LIMA-->LAD/diag, L radial-->OM1/OM3. Now doing well without anginal symptoms.  -Continue ASA  daily -Continue plavix  daily -Continue metop succinate 37.5mg  daily -Continue losartan 12.5 mg daily   #Tobacco Use Disorder: Working on quitting.  -Continue smoking cessation efforts -Advised him to try lozenges, gum etc as able to try to quit    #Hypertension  Controlled.  -Continue metop succinate 37.5mg  daily -Continue losartan 12.5 mg daily -Continue aldactone 12.5 mg daily    #T2DM:  -Continue Jardiance  daily   -last HbA1c 6.8    #CKD IIIa:  -Trend       FOLLOW UP IN 6 months   Medication Adjustments/Labs and Tests Ordered: Current medicines are reviewed at length with the patient today.  Concerns regarding medicines are outlined above.  No orders of the defined types were placed in this encounter.  No orders of the defined types were placed in this encounter.   Patient Instructions  Medication Instructions:   Your physician recommends that you continue on your current medications as directed. Please refer to the Current Medication list given to you today.  *If you need  a refill on your  cardiac medications before your next appointment, please call your pharmacy*   Follow-Up: At [facility], you and your health needs are our priority.  As part of our continuing mission to provide you with exceptional heart care, we have created designated Provider Care Teams.  These Care Teams include your primary Cardiologist (physician) and Advanced Practice Providers (APPs -  Physician Assistants and Nurse Practitioners) who all work together to provide you with the care you need, when you need it.  We recommend signing up for the patient portal called "MyChart".  Sign up information is provided on this After Visit Summary.  MyChart is used to connect with patients for Virtual Visits (Telemedicine).  Patients are able to view lab/test results, encounter notes, upcoming appointments, etc.  Non-urgent messages can be sent to your provider as well.   To learn more about what you can do with MyChart, go to [email or web address].    Your next appointment:   6 month(s)  The format for your next appointment:   In Person  Provider:   Laurance Flatten, MD     Liz Beach as a scribe for Meriam Sprague, MD.,have documented all relevant documentation on the behalf of Meriam Sprague, MD,as directed by  Meriam Sprague, MD while in the presence of Meriam Sprague, MD.  I, Meriam Sprague, MD, have reviewed all documentation for this visit. The documentation on 09/05/20 for the exam, diagnosis, procedures, and orders are all accurate and complete.   Signed, Meriam Sprague, MD  09/05/2020 3:02 PM    [facility]

## 2020-09-05 ENCOUNTER — Other Ambulatory Visit: Payer: Self-pay

## 2020-09-05 ENCOUNTER — Ambulatory Visit (INDEPENDENT_AMBULATORY_CARE_PROVIDER_SITE_OTHER): Payer: Self-pay | Admitting: Cardiology

## 2020-09-05 VITALS — BP 122/80 | HR 73 | Ht 71.0 in | Wt 163.6 lb

## 2020-09-05 DIAGNOSIS — I10 Essential (primary) hypertension: Secondary | ICD-10-CM

## 2020-09-05 DIAGNOSIS — N1831 Chronic kidney disease, stage 3a: Secondary | ICD-10-CM

## 2020-09-05 DIAGNOSIS — I2510 Atherosclerotic heart disease of native coronary artery without angina pectoris: Secondary | ICD-10-CM

## 2020-09-05 DIAGNOSIS — Z720 Tobacco use: Secondary | ICD-10-CM

## 2020-09-05 DIAGNOSIS — E1100 Type 2 diabetes mellitus with hyperosmolarity without nonketotic hyperglycemic-hyperosmolar coma (NKHHC): Secondary | ICD-10-CM

## 2020-09-05 DIAGNOSIS — E7800 Pure hypercholesterolemia, unspecified: Secondary | ICD-10-CM

## 2020-09-05 DIAGNOSIS — J90 Pleural effusion, not elsewhere classified: Secondary | ICD-10-CM

## 2020-09-05 DIAGNOSIS — I5042 Chronic combined systolic (congestive) and diastolic (congestive) heart failure: Secondary | ICD-10-CM

## 2020-09-05 DIAGNOSIS — Z951 Presence of aortocoronary bypass graft: Secondary | ICD-10-CM

## 2020-09-05 DIAGNOSIS — I5020 Unspecified systolic (congestive) heart failure: Secondary | ICD-10-CM

## 2020-09-05 NOTE — Patient Instructions (Signed)
Medication Instructions:   Your physician recommends that you continue on your current medications as directed. Please refer to the Current Medication list given to you today.  *If you need a refill on your cardiac medications before your next appointment, please call your pharmacy*   Follow-Up: At [facility], you and your health needs are our priority.  As part of our continuing mission to provide you with exceptional heart care, we have created designated Provider Care Teams.  These Care Teams include your primary Cardiologist (physician) and Advanced Practice Providers (APPs -  Physician Assistants and Nurse Practitioners) who all work together to provide you with the care you need, when you need it.  We recommend signing up for the patient portal called "MyChart".  Sign up information is provided on this After Visit Summary.  MyChart is used to connect with patients for Virtual Visits (Telemedicine).  Patients are able to view lab/test results, encounter notes, upcoming appointments, etc.  Non-urgent messages can be sent to your provider as well.   To learn more about what you can do with MyChart, go to [email or web address].    Your next appointment:   6 month(s)  The format for your next appointment:   In Person  Provider:   Heather Pemberton, MD     

## 2020-09-17 ENCOUNTER — Other Ambulatory Visit: Payer: Self-pay

## 2020-09-17 ENCOUNTER — Ambulatory Visit (INDEPENDENT_AMBULATORY_CARE_PROVIDER_SITE_OTHER): Payer: Self-pay | Admitting: Orthopedic Surgery

## 2020-09-17 DIAGNOSIS — S98131A Complete traumatic amputation of one right lesser toe, initial encounter: Secondary | ICD-10-CM

## 2020-09-17 DIAGNOSIS — Z89421 Acquired absence of other right toe(s): Secondary | ICD-10-CM

## 2020-09-17 DIAGNOSIS — L97511 Non-pressure chronic ulcer of other part of right foot limited to breakdown of skin: Secondary | ICD-10-CM

## 2020-09-17 DIAGNOSIS — L97521 Non-pressure chronic ulcer of other part of left foot limited to breakdown of skin: Secondary | ICD-10-CM

## 2020-09-20 ENCOUNTER — Encounter: Payer: Self-pay | Admitting: Orthopedic Surgery

## 2020-09-20 NOTE — Progress Notes (Signed)
Office Visit Note   Patient: Curtis Watkins           Date of Birth: 1955-05-28           MRN: [id number] Visit Date: 09/17/2020              Requested by: Doran Stabler, NP [phone] [place],  [place] [address] PCP: Rema Fendt, NP  Chief Complaint  Patient presents with   Right Foot - Follow-up    04/24/20 right foot 2nd & 3rd toe amp      HPI: Patient is about 5 months status post right foot second and third toe amputation he states he is doing well without problems.  Patient states he has painful callus on the left foot as well as thickened discolored onychomycotic nails that he is unable to safely trim on his own.  Assessment & Plan: Visit Diagnoses:  1. Amputated toe of right foot (HCC)     Plan: Nails were trimmed x5 ulcers debrided x2 plan to follow-up in 4 weeks  Follow-Up Instructions: Return in about 4 weeks (around 10/15/2020).   Ortho Exam  Patient is alert, oriented, no adenopathy, well-dressed, normal affect, normal respiratory effort. Examination patient's right foot shows well-healed amputation of the second and third toes no redness no cellulitis .  Patient does have a Wagner grade 1 ulcer on the plantar aspect of the right foot.  Examination of the left foot he has a Wagner grade 1 ulcers also..  Each ulcer is approximately 1 cm diameter prior to debridement, after informed consent the left and right ulcer were debrided of skin and soft tissue back to healthy viable granulation tissue.  The left foot ulcer is 3 cm in diameter 1 mm deep after debridement the right foot ulcer is 2 cm in diameter 1 mm deep after debridement patient does have 5 thickened discolored onychomycotic nails the nails were trimmed x5 without complication.  Imaging: No results found. No images are attached to the encounter.  Labs: Lab Results  Component Value Date   HGBA1C 8.1 (H) 08/02/2020   HGBA1C 7.8 (A) 08/02/2020   HGBA1C 6.8 (H) 03/28/2020     Lab  Results  Component Value Date   ALBUMIN 4.0 08/02/2020    Lab Results  Component Value Date   MG 2.6 (H) 04/04/2020   MG 2.6 (H) 04/04/2020   MG 2.9 (H) 04/03/2020   No results found for: VD25OH  No results found for: PREALBUMIN CBC EXTENDED Latest Ref Rng & Units 08/02/2020 05/24/2020 04/06/2020  WBC 3.4 - 10.8 x10E3/uL 8.0 7.8 11.1(H)  RBC 4.14 - 5.80 x10E6/uL 3.92(L) 3.02(L) 2.75(L)  HGB 13.0 - 17.7 g/dL 11.4(L) 8.7(L) 8.3(L)  HCT 37.5 - 51.0 % 34.9(L) 27.8(L) 25.1(L)  PLT 150 - 450 x10E3/uL 237 408(H) 147(L)     There is no height or weight on file to calculate BMI.  Orders:  No orders of the defined types were placed in this encounter.  No orders of the defined types were placed in this encounter.    Procedures: No procedures performed  Clinical Data: No additional findings.  ROS:  All other systems negative, except as noted in the HPI. Review of Systems  Objective: Vital Signs: There were no vitals taken for this visit.  Specialty Comments:  No specialty comments available.  PMFS History: Patient Active Problem List   Diagnosis Date Noted   HFrEF (heart failure with reduced ejection fraction) (HCC) 08/21/2020   Acute  on chronic systolic and diastolic heart failure, NYHA class 3 (HCC) 05/25/2020   Pleural effusion    Gangrene of toe of right foot (HCC)    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   Coronary artery disease involving native coronary artery of native heart with unstable angina pectoris (HCC)    Acute congestive heart failure (HCC) 03/27/2020   Diabetic foot ulcers (HCC) 03/27/2020   DM2 (diabetes mellitus, type 2) (HCC) 03/27/2020   HTN (hypertension) 03/27/2020   Past Medical History:  Diagnosis Date   Carotid artery disease (HCC)    04/01/20: 60-79% RICA stenosis, 1-39% LICA stenosis   Coronary artery disease    Diabetes mellitus without complication (HCC)    HFrEF (heart failure with reduced ejection fraction) (HCC)    Echo 4/22: EF 25-30, global HK,  small effusion without tamponade // Echo 6/22: EF 45, global HK, mild MR, RVSP 31.9   Hypertension    Ischemic cardiomyopathy    PAD (peripheral artery disease) (HCC)    occluded right axillary artery 03/29/20     History reviewed. No pertinent family history.  Past Surgical History:  Procedure Laterality Date   AMPUTATION Right 04/24/2020   Procedure: RIGHT 2ND AND 3RD TOE AMPUTATION;  Surgeon: Nadara Mustard, MD;  Location: [facility] OR;  Service: Orthopedics;  Laterality: Right;   CENTRAL VENOUS CATHETER INSERTION Left 04/03/2020   Procedure: INSERTION OF FEMORAL ARTERIAL LINE;  Surgeon: Linden Dolin, MD;  Location: [facility] OR;  Service: Open Heart Surgery;  Laterality: Left;   CORONARY ARTERY BYPASS GRAFT N/A 04/03/2020   Procedure: CORONARY ARTERY BYPASS GRAFTING (CABG) TIMES FOUR USING LEFT INTERNAL MAMMARY ARTERY AND LEFT RADIAL ARTERY.;  Surgeon: Linden Dolin, MD;  Location: [facility] OR;  Service: Open Heart Surgery;  Laterality: N/A;   CORONARY PRESSURE WIRE/FFR WITH 3D MAPPING N/A 03/29/2020   Procedure: Coronary Pressure Wire/FFR w/3D Mapping;  Surgeon: Lyn Records, MD;  [facility]: [facility] IN[facility];  Service: Cardiovascular;  Laterality: N/A;   IR THORACENTESIS ASP PLEURAL SPACE W/IMG GUIDE  05/03/2020   IR THORACENTESIS ASP PLEURAL SPACE W/IMG GUIDE  05/16/2020   LEFT HEART CATH AND CORONARY ANGIOGRAPHY N/A 03/29/2020   Procedure: LEFT HEART CATH AND CORONARY ANGIOGRAPHY;  Surgeon: Lyn Records, MD;  [facility]: [facility];  Service: Cardiovascular;  Laterality: N/A;   RADIAL ARTERY HARVEST Left 04/03/2020   Procedure: LEFT RADIAL ARTERY HARVEST;  Surgeon: Linden Dolin, MD;  Location: [facility] OR;  Service: Open Heart Surgery;  Laterality: Left;   TEE WITHOUT CARDIOVERSION N/A 04/03/2020   Procedure: TRANSESOPHAGEAL ECHOCARDIOGRAM (TEE);  Surgeon: Linden Dolin, MD;  Location: [facility] OR;  Service: Open Heart Surgery;  Laterality: N/A;   UPPER EXTREMITY ANGIOGRAPHY  03/29/2020   Procedure: Upper  Extremity Angiography;  Surgeon: Lyn Records, MD;  Location: [facility] INVASIVE CV LAB;  Service: Cardiovascular;;  rt.  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

## 2020-09-23 ENCOUNTER — Other Ambulatory Visit (HOSPITAL_COMMUNITY): Payer: Self-pay | Admitting: Internal Medicine

## 2020-09-23 ENCOUNTER — Other Ambulatory Visit (HOSPITAL_COMMUNITY): Payer: Self-pay

## 2020-09-24 ENCOUNTER — Other Ambulatory Visit (HOSPITAL_COMMUNITY): Payer: Self-pay

## 2020-09-24 ENCOUNTER — Other Ambulatory Visit: Payer: Self-pay

## 2020-09-24 ENCOUNTER — Ambulatory Visit: Payer: Self-pay | Attending: Family | Admitting: Pharmacist

## 2020-09-24 ENCOUNTER — Telehealth: Payer: Self-pay | Admitting: Cardiology

## 2020-09-24 ENCOUNTER — Telehealth (HOSPITAL_BASED_OUTPATIENT_CLINIC_OR_DEPARTMENT_OTHER): Payer: Self-pay | Admitting: Cardiology

## 2020-09-24 ENCOUNTER — Encounter: Payer: Self-pay | Admitting: Pharmacist

## 2020-09-24 DIAGNOSIS — E1100 Type 2 diabetes mellitus with hyperosmolarity without nonketotic hyperglycemic-hyperosmolar coma (NKHHC): Secondary | ICD-10-CM

## 2020-09-24 MED ORDER — ATORVASTATIN CALCIUM 80 MG PO TABS: 80.0000 mg | ORAL_TABLET | Freq: Every day | ORAL | 2 refills | Status: DC

## 2020-09-24 MED ORDER — CLOPIDOGREL BISULFATE 75 MG PO TABS: 75.0000 mg | ORAL_TABLET | Freq: Every day | ORAL | 2 refills | Status: DC

## 2020-09-24 MED ORDER — METOPROLOL SUCCINATE ER 25 MG PO TB24: 37.5000 mg | ORAL_TABLET | Freq: Every day | ORAL | 2 refills | Status: DC

## 2020-09-24 MED ORDER — SPIRONOLACTONE 25 MG PO TABS: 12.5000 mg | ORAL_TABLET | Freq: Every day | ORAL | 2 refills | Status: DC

## 2020-09-24 MED ORDER — LOSARTAN POTASSIUM 25 MG PO TABS: 12.5000 mg | ORAL_TABLET | Freq: Every day | ORAL | 2 refills | Status: DC

## 2020-09-24 MED ORDER — OZEMPIC (0.25 OR 0.5 MG/DOSE) 2 MG/1.5ML ~~LOC~~ SOPN
0.2500 mg | PEN_INJECTOR | SUBCUTANEOUS | 1 refills | Status: DC
  Filled 2020-09-24: qty 1.5, 56d supply, fill #0

## 2020-09-24 MED ORDER — EMPAGLIFLOZIN 25 MG PO TABS
25.0000 mg | ORAL_TABLET | Freq: Every day | ORAL | 2 refills | Status: DC
  Filled 2020-09-24 – 2021-03-26 (×2): qty 30, 30d supply, fill #0
  Filled 2021-04-24: qty 30, 30d supply, fill #1
  Filled 2021-05-26: qty 30, 30d supply, fill #2

## 2020-09-24 MED ORDER — FUROSEMIDE 20 MG PO TABS: 20.0000 mg | ORAL_TABLET | Freq: Every day | ORAL | 2 refills | Status: DC

## 2020-09-24 NOTE — Addendum Note (Signed)
Addended by: Ileene Musa D on: 09/24/2020 08:59 AM   Modules accepted: Orders

## 2020-09-24 NOTE — Telephone Encounter (Signed)
Rx request sent to pharmacy.  

## 2020-09-24 NOTE — Telephone Encounter (Signed)
*  STAT* If patient is at the pharmacy, call can be transferred to refill team.   1. Which medications need to be refilled? (please list name of each medication and dose if known) spironolactone (ALDACTONE) 25 MG tablet atorvastatin (LIPITOR) 80 MG tablet clopidogrel (PLAVIX) 75 MG tablet furosemide (LASIX) 20 MG tablet losartan (COZAAR) 25 MG tablet metoprolol succinate (TOPROL XL) 25 MG 24 hr tablet  2. Which pharmacy/location (including street and city if local pharmacy) is medication to be sent to? [name]  3. Do they need a 30 day or 90 day supply? 90

## 2020-09-24 NOTE — Telephone Encounter (Signed)
*  STAT* If patient is at the pharmacy, call can be transferred to refill team.   1. Which medications need to be refilled? (please list name of each medication and dose if known) atorvastatin (LIPITOR) 80 MG tablet clopidogrel (PLAVIX) 75 MG tablet furosemide (LASIX) 20 MG tablet losartan (COZAAR) 25 MG tablet metoprolol succinate (TOPROL XL) 25 MG 24 hr tablet 2. Which pharmacy/location (including street and city if local pharmacy) is medication to be sent to? [name]  3. Do they need a 30 day or 90 day supply? 90 day supply    Pt  is out of this medicTIONS

## 2020-09-24 NOTE — Progress Notes (Signed)
    S:    PCP Amy Zonia Kief  No chief complaint on file.  Patient arrives in good spirits.  Presents for diabetes evaluation, education, and management. Patient was referred and last seen by Primary Care Provider on 08/02/2020. I saw him on 09/03/2020 and added Ozempic to his regimen.   Today, pt reports compliance to the Ozempic. Denies NV, abdominal pain.   Family/Social History:  -Fhx: none -Tobacco: some day smoker (0.25 PPD average) -Alcohol: no current use  Insurance coverage/medication affordability: self pay. Insurance coverage starts 11/23/2020.  Medication adherence reported .   Current diabetes medications include: Jardiance 25 mg daily, Ozempic 0.25 mg weekly Current hypertension medications include: losartan 25 mg daily, metoprolol succinate 37.5 mg daily, spironolactone 25 mg daily, furosemide for fluid control/symptoms Current hyperlipidemia medications include: atorvastatin 80 mg daily  Patient denies hypoglycemic events.  Patient reported dietary habits:  -Admits to having a sweet tooth but is trying to limit sweets  Patient-reported exercise habits:  -None outside of house work and outdoor chores   Patient denies nocturia (nighttime urination).  Patient denies in changes from baseline neuropathy (nerve pain). Patient denies visual changes since hospitalization. Patient reports self foot exams.     O:  Lab Results  Component Value Date   HGBA1C 8.1 (H) 08/02/2020   There were no vitals filed for this visit.  Lipid Panel     Component Value Date/Time   CHOL 100 08/02/2020 1146   TRIG 55 08/02/2020 1146   HDL 52 08/02/2020 1146   CHOLHDL 1.9 08/02/2020 1146   CHOLHDL 2.8 03/29/2020 0600   VLDL 12 03/29/2020 0600   LDLCALC 35 08/02/2020 1146    Home post-prandial blood sugars: no meter. Gives range mostly in the 130s. Denies any readings > 200.   Clinical Atherosclerotic Cardiovascular Disease (ASCVD): Yes  The ASCVD Risk score Denman George DC Jr., et  al., 2013) failed to calculate for the following reasons:   The patient has a prior MI or stroke diagnosis   A/P: Diabetes longstanding currently uncontrolled. Patient is able to verbalize appropriate hypoglycemia management plan. Medication adherence appears optimal. He is tolerating Ozempic well. Will hold off on increasing now and bring him back in 1 month. If his weight is stable, we can consider increasing to 0.5 mg weekly.  -Continued Jardiance 25 mg daily. -Continue Ozempic 0.25 mg once weekly.  -Extensively discussed pathophysiology of diabetes, recommended lifestyle interventions, dietary effects on blood sugar control -Counseled on s/sx of and management of hypoglycemia -Next A1C anticipated 10/2020.   ASCVD risk - secondary prevention in patient with diabetes. Last LDL is well controlled. Encouraged compliance with his high intensity statin.  -Continued atorvastatin 80 mg.   Written patient instructions provided.  Total time in face to face counseling 30 minutes.  Follow up Pharmacist Clinic Visit in 1 month.  Butch Penny, PharmD, [place], CPP Clinical Pharmacist [facility] [phone]

## 2020-09-25 ENCOUNTER — Other Ambulatory Visit (HOSPITAL_COMMUNITY): Payer: Self-pay

## 2020-09-25 ENCOUNTER — Other Ambulatory Visit: Payer: Self-pay

## 2020-09-25 MED ORDER — FUROSEMIDE 20 MG PO TABS
20.0000 mg | ORAL_TABLET | Freq: Every day | ORAL | 1 refills | Status: DC
  Filled 2020-09-25: qty 30, 30d supply, fill #0
  Filled 2020-10-29: qty 30, 30d supply, fill #1
  Filled 2020-11-26: qty 30, 30d supply, fill #2
  Filled 2020-12-24: qty 30, 30d supply, fill #3
  Filled 2021-01-27: qty 30, 30d supply, fill #4
  Filled 2021-02-24: qty 30, 30d supply, fill #5

## 2020-09-25 MED ORDER — CLOPIDOGREL BISULFATE 75 MG PO TABS
75.0000 mg | ORAL_TABLET | Freq: Every day | ORAL | 1 refills | Status: DC
  Filled 2020-09-25: qty 30, 30d supply, fill #0
  Filled 2020-10-29: qty 30, 30d supply, fill #1
  Filled 2020-11-26: qty 30, 30d supply, fill #2
  Filled 2020-12-24: qty 30, 30d supply, fill #3
  Filled 2021-01-27: qty 30, 30d supply, fill #4
  Filled 2021-02-24: qty 30, 30d supply, fill #5

## 2020-09-25 MED ORDER — ATORVASTATIN CALCIUM 80 MG PO TABS
80.0000 mg | ORAL_TABLET | Freq: Every day | ORAL | 1 refills | Status: DC
  Filled 2020-09-25: qty 30, 30d supply, fill #0
  Filled 2020-10-29: qty 30, 30d supply, fill #1
  Filled 2020-11-26: qty 30, 30d supply, fill #2
  Filled 2020-12-24: qty 30, 30d supply, fill #3
  Filled 2021-01-27: qty 30, 30d supply, fill #4
  Filled 2021-02-24: qty 30, 30d supply, fill #5

## 2020-09-25 MED ORDER — METOPROLOL SUCCINATE ER 25 MG PO TB24
37.5000 mg | ORAL_TABLET | Freq: Every day | ORAL | 1 refills | Status: DC
  Filled 2020-09-25: qty 45, 30d supply, fill #0
  Filled 2020-10-29: qty 45, 30d supply, fill #1
  Filled 2020-11-26: qty 45, 30d supply, fill #2
  Filled 2020-12-24: qty 45, 30d supply, fill #3
  Filled 2021-01-27: qty 45, 30d supply, fill #4
  Filled 2021-02-24: qty 45, 30d supply, fill #5

## 2020-09-25 MED ORDER — LOSARTAN POTASSIUM 25 MG PO TABS
12.5000 mg | ORAL_TABLET | Freq: Every day | ORAL | 1 refills | Status: DC
  Filled 2020-09-25: qty 15, 30d supply, fill #0
  Filled 2020-10-29: qty 15, 30d supply, fill #1
  Filled 2020-11-26: qty 15, 30d supply, fill #2
  Filled 2020-12-24: qty 15, 30d supply, fill #3
  Filled 2021-01-27: qty 15, 30d supply, fill #4
  Filled 2021-02-24: qty 15, 30d supply, fill #5

## 2020-09-25 NOTE — Telephone Encounter (Signed)
Rx request sent to pharmacy.  

## 2020-09-26 ENCOUNTER — Telehealth (HOSPITAL_BASED_OUTPATIENT_CLINIC_OR_DEPARTMENT_OTHER): Payer: Self-pay | Admitting: Cardiology

## 2020-09-26 NOTE — Telephone Encounter (Signed)
*  STAT* If patient is at the pharmacy, call can be transferred to refill team.   1. Which medications need to be refilled? (please list name of each medication and dose if known) spironolactone (ALDACTONE) 25 MG tablet  2. Which pharmacy/location (including street and city if local pharmacy) is medication to be sent to? [name]  3. Do they need a 30 day or 90 day supply? 90

## 2020-09-27 ENCOUNTER — Other Ambulatory Visit (HOSPITAL_COMMUNITY): Payer: Self-pay

## 2020-09-27 MED ORDER — SPIRONOLACTONE 25 MG PO TABS
12.5000 mg | ORAL_TABLET | Freq: Every day | ORAL | 2 refills | Status: DC
  Filled 2020-09-27: qty 15, 30d supply, fill #0
  Filled 2020-10-29: qty 15, 30d supply, fill #1
  Filled 2020-11-26: qty 15, 30d supply, fill #2
  Filled 2020-12-24: qty 15, 30d supply, fill #3
  Filled 2021-01-27: qty 15, 30d supply, fill #4
  Filled 2021-02-24: qty 15, 30d supply, fill #5
  Filled 2021-03-26: qty 15, 30d supply, fill #6
  Filled 2021-04-24: qty 15, 30d supply, fill #7
  Filled 2021-05-26: qty 15, 30d supply, fill #8

## 2020-09-27 NOTE — Telephone Encounter (Signed)
Rx(s) sent to pharmacy electronically.  

## 2020-10-02 ENCOUNTER — Other Ambulatory Visit: Payer: Self-pay

## 2020-10-15 ENCOUNTER — Ambulatory Visit (INDEPENDENT_AMBULATORY_CARE_PROVIDER_SITE_OTHER): Payer: Self-pay | Admitting: Physician Assistant

## 2020-10-15 ENCOUNTER — Encounter: Payer: Self-pay | Admitting: Orthopedic Surgery

## 2020-10-15 DIAGNOSIS — L97521 Non-pressure chronic ulcer of other part of left foot limited to breakdown of skin: Secondary | ICD-10-CM

## 2020-10-15 NOTE — Progress Notes (Signed)
Office Visit Note   Patient: Curtis Watkins           Date of Birth: 04-16-1955           MRN: [id number] Visit Date: 10/15/2020              Requested by: Rema Fendt, NP 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  At last visit they were trimmed.  He also had his nails trimmed.  He has no concerns  Assessment & Plan: Visit Diagnoses: No diagnosis found.  Plan: Follow-up in 2 months we can trim his nails again.  If he has any concerns about the feet could follow-up sooner he is doing very well  Follow-Up Instructions: No follow-ups on file.   Ortho Exam  Patient is alert, oriented, no adenopathy, well-dressed, normal affect, normal respiratory effort. Bilateral feet he no longer has ulcers.  He does have calluses beneath the forefoot.  There is no surrounding cellulitis no drainage after verbal consent was obtained these were trimmed to a healthy soft surface no concerns for infection  Imaging: No results found. No images are attached to the encounter.  Labs: Lab Results  Component Value Date   HGBA1C 8.1 (H) 08/02/2020   HGBA1C 7.8 (A) 08/02/2020   HGBA1C 6.8 (H) 03/28/2020     Lab Results  Component Value Date   ALBUMIN 4.0 08/02/2020    Lab Results  Component Value Date   MG 2.6 (H) 04/04/2020   MG 2.6 (H) 04/04/2020   MG 2.9 (H) 04/03/2020   No results found for: VD25OH  No results found for: PREALBUMIN CBC EXTENDED Latest Ref Rng & Units 08/02/2020 05/24/2020 04/06/2020  WBC 3.4 - 10.8 x10E3/uL 8.0 7.8 11.1(H)  RBC 4.14 - 5.80 x10E6/uL 3.92(L) 3.02(L) 2.75(L)  HGB 13.0 - 17.7 g/dL 11.4(L) 8.7(L) 8.3(L)  HCT 37.5 - 51.0 % 34.9(L) 27.8(L) 25.1(L)  PLT 150 - 450 x10E3/uL 237 408(H)  147(L)     There is no height or weight on file to calculate BMI.  Orders:  No orders of the defined types were placed in this encounter.  No orders of the defined types were placed in this encounter.    Procedures: No procedures performed  Clinical Data: No additional findings.  ROS:  All other systems negative, except as noted in the HPI. Review of Systems  Objective: Vital Signs: There were no vitals taken for this visit.  Specialty Comments:  No specialty comments available.  PMFS History: Patient Active Problem List   Diagnosis Date Noted   HFrEF (heart failure with reduced ejection fraction) (HCC) 08/21/2020   Acute on chronic systolic and diastolic heart failure, NYHA class 3 (HCC) 05/25/2020   Pleural effusion    Gangrene of toe of right foot (HCC)    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   Coronary artery disease involving native coronary artery of native heart with unstable angina pectoris (HCC)    Acute congestive heart failure (HCC) 03/27/2020   Diabetic foot ulcers (HCC) 03/27/2020   DM2 (diabetes mellitus, type 2) (HCC) 03/27/2020   HTN (hypertension) 03/27/2020   Past Medical History:  Diagnosis Date   Carotid artery  disease (HCC)    04/01/20: 60-79% RICA stenosis, 1-39% LICA stenosis   Coronary artery disease    Diabetes mellitus without complication (HCC)    HFrEF (heart failure with reduced ejection fraction) (HCC)    Echo 4/22: EF 25-30, global HK, small effusion without tamponade // Echo 6/22: EF 45, global HK, mild MR, RVSP 31.9   Hypertension    Ischemic cardiomyopathy    PAD (peripheral artery disease) (HCC)    occluded right axillary artery 03/29/20     No family history on file.  Past Surgical History:  Procedure Laterality Date   AMPUTATION Right 04/24/2020   Procedure: RIGHT 2ND AND 3RD TOE AMPUTATION;  Surgeon: Nadara Mustard, MD;  Location: [facility] OR;  Service: Orthopedics;  Laterality: Right;   CENTRAL VENOUS CATHETER INSERTION Left 04/03/2020    Procedure: INSERTION OF FEMORAL ARTERIAL LINE;  Surgeon: Linden Dolin, MD;  Location: [facility] OR;  Service: Open Heart Surgery;  Laterality: Left;   CORONARY ARTERY BYPASS GRAFT N/A 04/03/2020   Procedure: CORONARY ARTERY BYPASS GRAFTING (CABG) TIMES FOUR USING LEFT INTERNAL MAMMARY ARTERY AND LEFT RADIAL ARTERY.;  Surgeon: Linden Dolin, MD;  Location: [facility] OR;  Service: Open Heart Surgery;  Laterality: N/A;   CORONARY PRESSURE WIRE/FFR WITH 3D MAPPING N/A 03/29/2020   Procedure: Coronary Pressure Wire/FFR w/3D Mapping;  Surgeon: Lyn Records, MD;  Location: [facility] INVASIVE CV LAB;  Service: Cardiovascular;  Laterality: N/A;   IR THORACENTESIS ASP PLEURAL SPACE W/IMG GUIDE  05/03/2020   IR THORACENTESIS ASP PLEURAL SPACE W/IMG GUIDE  05/16/2020   LEFT HEART CATH AND CORONARY ANGIOGRAPHY N/A 03/29/2020   Procedure: LEFT HEART CATH AND CORONARY ANGIOGRAPHY;  Surgeon: Lyn Records, MD;  Location: [facility] INVASIVE CV LAB;  Service: Cardiovascular;  Laterality: N/A;   RADIAL ARTERY HARVEST Left 04/03/2020   Procedure: LEFT RADIAL ARTERY HARVEST;  Surgeon: Linden Dolin, MD;  Location: [facility] OR;  Service: Open Heart Surgery;  Laterality: Left;   TEE WITHOUT CARDIOVERSION N/A 04/03/2020   Procedure: TRANSESOPHAGEAL ECHOCARDIOGRAM (TEE);  Surgeon: Linden Dolin, MD;  [facility];  Service: Open Heart Surgery;  Laterality: N/A;   UPPER EXTREMITY ANGIOGRAPHY  03/29/2020   Procedure: Upper Extremity Angiography;  Surgeon: Lyn Records, MD;  Location: [facility] INVASIVE CV LAB;  Service: Cardiovascular;;  rt.  arm   Social History   Occupational History   Occupation: retired d/t medical conditions  Tobacco Use   Smoking status: Some Days    Packs/day: 0.25    Years: 2.00    Pack years: 0.50    Types: Cigarettes   Smokeless tobacco: Never  Vaping Use   Vaping Use: Never used  Substance and Sexual Activity   Alcohol use: Not Currently    Alcohol/week: 1.0 standard drink    Types: 1 Cans of beer per week     Comment: occassionally   Drug use: Not Currently    Frequency: 3.0 times per week    Types: Marijuana   Sexual activity: Not Currently

## 2020-10-25 ENCOUNTER — Other Ambulatory Visit: Payer: Self-pay

## 2020-10-25 ENCOUNTER — Encounter: Payer: Self-pay | Admitting: Pharmacist

## 2020-10-25 ENCOUNTER — Ambulatory Visit: Payer: Self-pay | Attending: Family | Admitting: Pharmacist

## 2020-10-25 VITALS — Wt 161.4 lb

## 2020-10-25 DIAGNOSIS — E1100 Type 2 diabetes mellitus with hyperosmolarity without nonketotic hyperglycemic-hyperosmolar coma (NKHHC): Secondary | ICD-10-CM

## 2020-10-25 LAB — GLUCOSE, POCT (MANUAL RESULT ENTRY): POC Glucose: 180 mg/dl — AB (ref 70–99)

## 2020-10-25 MED ORDER — OZEMPIC (0.25 OR 0.5 MG/DOSE) 2 MG/1.5ML ~~LOC~~ SOPN
0.5000 mg | PEN_INJECTOR | SUBCUTANEOUS | 2 refills | Status: DC
  Filled 2020-10-25: qty 1.5, 28d supply, fill #0
  Filled 2020-11-29: qty 1.5, 28d supply, fill #1
  Filled 2020-12-30 – 2021-01-06 (×3): qty 1.5, 28d supply, fill #2

## 2020-10-25 NOTE — Progress Notes (Signed)
    S:    PCP Amy Zonia Kief  No chief complaint on file.  Patient arrives in good spirits.  Presents for diabetes evaluation, education, and management. Patient was referred and last seen by Primary Care Provider on 08/02/2020. He was seen by pharmacy on 09/03/2020 and Ozempic was added to his regimen at that visit. A1c was not at goal at last visit 09/24/2020, but Ozempic was kept at 0.25 mg to further evaluate pt's tolerance and ensure no weight loss before increasing the dose.  Today, pt reports compliance to the Ozempic. Denies NV, abdominal pain.   Family/Social History:  -Fhx: none -Tobacco: pt reports quitting since last visit -Alcohol: no current use  Insurance coverage/medication affordability: self pay. Insurance coverage starts 11/23/2020.  Medication adherence reported. Pt ran out of Ozempic so missed last Saturday's dose Current diabetes medications include: Jardiance 25 mg daily, Ozempic 0.25 mg weekly Current hypertension medications include: losartan 25 mg daily, metoprolol succinate 37.5 mg daily, spironolactone 25 mg daily, furosemide for fluid control/symptoms Current hyperlipidemia medications include: atorvastatin 80 mg daily  Patient denies hypoglycemic events.  Patient reported dietary habits:  -Admits to having a sweet tooth and eats sweets once a day or every other day; he also drinks milk, juice, coffee with cream (no sugar), does not drink sodas  Patient-reported exercise habits:  -None outside of house work and outdoor chores   Patient denies nocturia (nighttime urination).  Patient denies in changes from baseline neuropathy (nerve pain). Patient denies visual changes since hospitalization. Patient reports self foot exams.    Home post-prandial blood sugars: no meter. Gives FBG around 109 and various PPBG usally in the 130s. Denies any readings  <70 or > 200.   O:  POCT Glucose: 180  Filed Weights   10/25/20 0857  Weight: 161 lb 6.4 oz (73.2 kg)    There were no vitals filed for this visit.   Lab Results  Component Value Date   HGBA1C 8.1 (H) 08/02/2020   Lipid Panel     Component Value Date/Time   CHOL 100 08/02/2020 1146   TRIG 55 08/02/2020 1146   HDL 52 08/02/2020 1146   CHOLHDL 1.9 08/02/2020 1146   CHOLHDL 2.8 03/29/2020 0600   VLDL 12 03/29/2020 0600   LDLCALC 35 08/02/2020 1146    Clinical Atherosclerotic Cardiovascular Disease (ASCVD): Yes  The ASCVD Risk score Denman George DC Jr., et al., 2013) failed to calculate for the following reasons:   The patient has a prior MI or stroke diagnosis   A/P: Diabetes longstanding currently uncontrolled. Patient is able to verbalize appropriate hypoglycemia management plan. Medication adherence appears optimal. He is tolerating Ozempic well and he has not had any weight loss since starting Ozempic. Since weight has been steady, will increase Ozempic to 0.5 mg. -Continued Jardiance 25 mg daily. -Increase Ozempic 0.5 mg once weekly.  -Extensively discussed pathophysiology of diabetes, recommended lifestyle interventions, dietary effects on blood sugar control -Counseled on s/sx of and management of hypoglycemia -Next A1C anticipated 10/2020.   ASCVD risk - secondary prevention in patient with diabetes. Last LDL is well controlled. Encouraged compliance with his high intensity statin.  -Continued atorvastatin 80 mg.   Written patient instructions provided.  Total time in face to face counseling 30 minutes.  Follow up PCP 11/01/20  Mitzie Na, PharmD Pharmacy Resident [facility] [phone]  Butch Penny, PharmD, [place], CPP Clinical Pharmacist [facility] [phone]

## 2020-10-29 ENCOUNTER — Other Ambulatory Visit (HOSPITAL_COMMUNITY): Payer: Self-pay

## 2020-10-30 ENCOUNTER — Other Ambulatory Visit: Payer: Self-pay

## 2020-10-31 ENCOUNTER — Other Ambulatory Visit (HOSPITAL_COMMUNITY): Payer: Self-pay

## 2020-11-01 ENCOUNTER — Ambulatory Visit (INDEPENDENT_AMBULATORY_CARE_PROVIDER_SITE_OTHER): Payer: Self-pay | Admitting: Endocrinology

## 2020-11-01 ENCOUNTER — Encounter: Payer: Self-pay | Admitting: Endocrinology

## 2020-11-01 ENCOUNTER — Other Ambulatory Visit: Payer: Self-pay

## 2020-11-01 VITALS — BP 110/74 | HR 78 | Ht 71.0 in | Wt 163.2 lb

## 2020-11-01 DIAGNOSIS — E1100 Type 2 diabetes mellitus with hyperosmolarity without nonketotic hyperglycemic-hyperosmolar coma (NKHHC): Secondary | ICD-10-CM

## 2020-11-01 LAB — POCT GLYCOSYLATED HEMOGLOBIN (HGB A1C): Hemoglobin A1C: 6.7 % — AB (ref 4.0–5.6)

## 2020-11-01 NOTE — Patient Instructions (Addendum)
good diet and exercise significantly improve the control of your diabetes.  please let me know if you wish to be referred to a dietician.  high blood sugar is very risky to your health.  you should see an eye doctor and dentist every year.  It is very important to get all recommended vaccinations.  Controlling your blood pressure and cholesterol drastically reduces the damage diabetes does to your body.  Those who smoke should quit.  Please discuss these with your doctor.  check your blood sugar once a day.  vary the time of day when you check, between before the 3 meals, and at bedtime.  also check if you have symptoms of your blood sugar being too high or too low.  please keep a record of the readings and bring it to your next appointment here (or you can bring the meter itself).  You can write it on any piece of paper.  please call us sooner if your blood sugar goes below 70, or if most of your readings are over 200.   Please continue the same medications.   Please come back for a follow-up appointment in 2-3 months.

## 2020-11-01 NOTE — Progress Notes (Signed)
Subjective:    Patient ID: Curtis Watkins, male    DOB: Sep 01, 1955, 65 y.o.   MRN: [id number]  HPI pt is referred by Ricky Stabs, NP, for diabetes.  Pt states DM was dx'ed in 2020; it is complicated by stage 3 CRI, CAD, toe amputations, PN, and foot ulcers; he has never been on insulin; pt says his diet and exercise are good; he has never had pancreatitis, pancreatic surgery, severe hypoglycemia or DKA.  He takes [place] and [place]. He has a part time cleaning service.  He expects to get medicare next month.  He says cbg's are in the low-100's.     Past Medical History:  Diagnosis Date   Carotid artery disease (HCC)    04/01/20: 60-79% RICA stenosis, 1-39% LICA stenosis   Coronary artery disease    Diabetes mellitus without complication (HCC)    HFrEF (heart failure with reduced ejection fraction) (HCC)    Echo 4/22: EF 25-30, global HK, small effusion without tamponade // Echo 6/22: EF 45, global HK, mild MR, RVSP 31.9   Hypertension    Ischemic cardiomyopathy    PAD (peripheral artery disease) (HCC)    occluded right axillary artery 03/29/20     Past Surgical History:  Procedure Laterality Date   AMPUTATION Right 04/24/2020   Procedure: RIGHT 2ND AND 3RD TOE AMPUTATION;  Surgeon: Nadara Mustard, MD;  Location: [facility] OR;  Service: Orthopedics;  Laterality: Right;   CENTRAL VENOUS CATHETER INSERTION Left 04/03/2020   Procedure: INSERTION OF FEMORAL ARTERIAL LINE;  Surgeon: Linden Dolin, MD;  Location: [facility] OR;  Service: Open Heart Surgery;  Laterality: Left;   CORONARY ARTERY BYPASS GRAFT N/A 04/03/2020   Procedure: CORONARY ARTERY BYPASS GRAFTING (CABG) TIMES FOUR USING LEFT INTERNAL MAMMARY ARTERY AND LEFT RADIAL ARTERY.;  Surgeon: Linden Dolin, MD;  Location: [facility] OR;  Service: Open Heart Surgery;  Laterality: N/A;   CORONARY PRESSURE WIRE/FFR WITH 3D MAPPING N/A 03/29/2020   Procedure: Coronary Pressure Wire/FFR w/3D Mapping;  Surgeon: Lyn Records, MD;  Location: [facility] INVASIVE CV LAB;   Service: Cardiovascular;  Laterality: N/A;   IR THORACENTESIS ASP PLEURAL SPACE W/IMG GUIDE  05/03/2020   IR THORACENTESIS ASP PLEURAL SPACE W/IMG GUIDE  05/16/2020   LEFT HEART CATH AND CORONARY ANGIOGRAPHY N/A 03/29/2020   Procedure: LEFT HEART CATH AND CORONARY ANGIOGRAPHY;  Surgeon: Lyn Records, MD;  Location: [facility] INVASIVE CV LAB;  Service: Cardiovascular;  Laterality: N/A;   RADIAL ARTERY HARVEST Left 04/03/2020   Procedure: LEFT RADIAL ARTERY HARVEST;  Surgeon: Linden Dolin, MD;  Location: [facility] OR;  Service: Open Heart Surgery;  Laterality: Left;   TEE WITHOUT CARDIOVERSION N/A 04/03/2020   Procedure: TRANSESOPHAGEAL ECHOCARDIOGRAM (TEE);  Surgeon: Linden Dolin, MD;  Location: [facility] OR;  Service: Open Heart Surgery;  Laterality: N/A;   UPPER EXTREMITY ANGIOGRAPHY  03/29/2020   Procedure: Upper Extremity Angiography;  Surgeon: Lyn Records, MD;  Location: [facility] INVASIVE CV LAB;  Service: Cardiovascular;;  rt.  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/t medical conditions  Tobacco Use   Smoking status: Some Days    Packs/day: 0.25    Years: 2.00    Pack years: 0.50  Types: Cigarettes   Smokeless tobacco: Never  Vaping Use   Vaping Use: Never used  Substance and Sexual Activity   Alcohol use: Not Currently    Alcohol/week: 1.0 standard drink    Types: 1 Cans of beer per week    Comment: occassionally   Drug use: Not Currently    Frequency: 3.0 times per week    Types: Marijuana   Sexual activity: Not Currently  Other Topics Concern   Not on file  Social History Narrative   Not on file   Social Determinants of Health   Financial Resource Strain: High Risk   Difficulty of Paying Living Expenses: Hard  Food Insecurity: No Food Insecurity   Worried  About Running Out of Food in the Last Year: Never true   Ran Out of Food in the Last Year: Never true  Transportation Needs: Unmet Transportation Needs   Lack of Transportation (Medical): Yes   Lack of Transportation (Non-Medical): No  Physical Activity: Inactive   Days of Exercise per Week: 0 days   Minutes of Exercise per Session: 0 min  Stress: Not on file  Social Connections: Not on file  Intimate Partner Violence: Not on file    Current Outpatient Medications on File Prior to Visit  Medication Sig Dispense Refill   aspirin 81 MG tablet Take 1 tablet (81 mg total) by mouth daily.     atorvastatin (LIPITOR) 80 MG tablet Take 1 tablet (80 mg total) by mouth daily. 90 tablet 1   brimonidine (ALPHAGAN) 0.2 % ophthalmic solution Place 1 drop into the right eye as needed.     clopidogrel (PLAVIX) 75 MG tablet Take 1 tablet (75 mg total) by mouth daily. 90 tablet 1   empagliflozin (JARDIANCE) 25 MG TABS tablet Take 1 tablet (25 mg total) by mouth daily before breakfast. 30 tablet 2   furosemide (LASIX) 20 MG tablet Take 1 tablet (20 mg total) by mouth daily. 90 tablet 1   losartan (COZAAR) 25 MG tablet Take 0.5 tablets (12.5 mg total) by mouth daily. 45 tablet 1   metoprolol succinate (TOPROL XL) 25 MG 24 hr tablet Take 1.5 tablets (37.5 mg total) by mouth daily. 135 tablet 1   OVER THE COUNTER MEDICATION Apply 1 application topically daily. Diabetic lotion     Semaglutide,0.25 or 0.5MG /DOS, (OZEMPIC, 0.25 OR 0.5 MG/DOSE,) 2 MG/1.5ML SOPN Inject 0.5 mg into the skin once a week. 1.5 mL 2   spironolactone (ALDACTONE) 25 MG tablet Take 1/2 tablet (12.5 mg total) by mouth daily. 45 tablet 2   No current facility-administered medications on file prior to visit.    No Known Allergies  Family History  Problem Relation Age of Onset   Diabetes Mother     BP 110/74 (BP Location: Right Arm, Patient Position: Sitting, Cuff Size: Normal)   Pulse 78   Ht 5\' 11"  (1.803 m)   Wt 163 lb 3.2 oz  (74 kg)   SpO2 99%   BMI 22.76 kg/m     Review of Systems denies weight loss, sob, n/v, memory loss, and depression.     Objective:   Physical Exam Pulses: dorsalis pedis intact bilat.   MSK: no deformity of the feet, except right toes 2 and 3 are absent.   CV: no leg edema Skin:  no ulcer on the feet.  normal color and temp on the feet. Neuro: sensation is intact to touch on the feet, but decreased from normal Ext: there is bilateral onychomycosis of  the toenails.    Lab Results  Component Value Date   CREATININE 1.60 (H) 08/02/2020   BUN 31 (H) 08/02/2020   NA 142 08/02/2020   K 4.3 08/02/2020   CL 104 08/02/2020   CO2 19 (L) 08/02/2020   Lab Results  Component Value Date   TSH 0.708 08/02/2020    A1c=6.7%  I have reviewed outside records, and summarized: Pt was noted to have elevated A1c, and referred here. Ozempic was increased last week.  He gets meds from [employer], for free.      Assessment & Plan:  Type 2 DM: well-controlled.   Lean body habitus: he is prob evolving type 1 DM.    Patient Instructions  good diet and exercise significantly improve the control of your diabetes.  please let me know if you wish to be referred to a dietician.  high blood sugar is very risky to your health.  you should see an eye doctor and dentist every year.  It is very important to get all recommended vaccinations.  Controlling your blood pressure and cholesterol drastically reduces the damage diabetes does to your body.  Those who smoke should quit.  Please discuss these with your doctor.  check your blood sugar once a day.  vary the time of day when you check, between before the 3 meals, and at bedtime.  also check if you have symptoms of your blood sugar being too high or too low.  please keep a record of the readings and bring it to your next appointment here (or you can bring the meter itself).  You can write it on any piece of paper.  please call us sooner if your  blood sugar goes below 70, or if most of your readings are over 200.   Please continue the same medications.   Please come back for a follow-up appointment in 2-3 months.

## 2020-11-26 ENCOUNTER — Other Ambulatory Visit (HOSPITAL_COMMUNITY): Payer: Self-pay

## 2020-11-27 ENCOUNTER — Other Ambulatory Visit: Payer: Self-pay

## 2020-11-29 ENCOUNTER — Other Ambulatory Visit: Payer: Self-pay

## 2020-12-16 ENCOUNTER — Ambulatory Visit: Payer: Self-pay | Admitting: Orthopedic Surgery

## 2020-12-24 ENCOUNTER — Other Ambulatory Visit (HOSPITAL_COMMUNITY): Payer: Self-pay

## 2020-12-24 ENCOUNTER — Telehealth: Payer: Self-pay | Admitting: Cardiology

## 2020-12-24 NOTE — Telephone Encounter (Signed)
Maryann from [facility] is calling for assistance with getting all the pts cardiac meds still covered under the Heart Failure Fund.   [name] states that the pt will not qualify to get his cardiac meds under the "Heart Failure Fund" unless LCSW can call her or provide information that this pt has been receiving this fund for known heart failure through their program.  [name] states he will have to pay out-of-pocket for his cardiac meds, if this is not provided.   [name] states that she called over to heart failure clinic and he is not currently seeing them in their clinic, so she couldn't get assistance from them.   [name] states if our Social Worker Lasandra Beech could reach out to her and call her at [facility] and provide over the phone that the pt is needing assistance through their heart failure fund, then they will be able to continue filling the pts medications under this assistance, and avoid him having to pay-out-of-pocket for his HF meds.  Informed Maryann that I will route this message to Lasandra Beech LCSW and have her follow-up with them, to further assist with providing information for the pt to continue getting his medications under the HF fund.  Maryann verbalized understanding and agrees with this plan.

## 2020-12-24 NOTE — Telephone Encounter (Signed)
[name] is calling stating Curtis Watkins is not a CHF patient so he does not qualify to receive his medications free of cost. The prescriptions through Dr. Shari Prows will be charged to him completely out of pocket due to him not having insurance. Due to this she is calling to see if there is any assistance we can offer for these medications.

## 2020-12-24 NOTE — Telephone Encounter (Signed)
Zev, Blue - 12/24/2020  3:09 PM Marcy Siren, LCSW  Sent: Tue December 24, 2020  5:46 PM  To: Loa Socks, LPN  Cc: Meriam Sprague, MD; [facility]          Message  Unfortunately, the Heart Failure Fund is a medication fund that is specifically for patients that are followed through the Advanced Heart Failure Fund. This patient is followed at [facility]. I will have to follow up with him to find an alternative means of coverage for his medications. I will reach out to the patient tomorrow.  Thanks for reaching out.  Annice Pih

## 2020-12-25 ENCOUNTER — Other Ambulatory Visit (HOSPITAL_COMMUNITY): Payer: Self-pay

## 2020-12-25 NOTE — Telephone Encounter (Signed)
  Curtis Watkins with [facility] called, she said, she spoke with her supervisor and they approved CHF fund for 1 time only for the pt, she did call pt and advised to also apply for prescription coverage under medicare

## 2020-12-25 NOTE — Telephone Encounter (Signed)
Will send this helpful information to Dr. Devin Going in-basket for her covering to review as an FYI, as well as to [employer] LCSW.

## 2020-12-26 ENCOUNTER — Telehealth (HOSPITAL_COMMUNITY): Payer: Self-pay | Admitting: Licensed Clinical Social Worker

## 2020-12-26 ENCOUNTER — Other Ambulatory Visit (HOSPITAL_COMMUNITY): Payer: Self-pay

## 2020-12-26 NOTE — Telephone Encounter (Signed)
CSW reaching out to patient because they are currently receiving medications through Heart Failure Fund.  CSW called to discuss if pt is eligible for any alternative coverage options.  Pt just turned 65 this month and reports he did sign up for Medicare benefits but only the traditional medicare which does not include prescription coverage.  CSW provided pt with number for [facility] to discuss coverage options and find something that is financial feasible for him.  Does not know yet what his retirement benefit will be so unable to counsel pt if he will be eligible for LIS assistance.  CSW will continue to follow to assist as needed.  Burna Sis, LCSW Clinical Social Worker [facility] Desk#: [phone] Cell#: [phone]

## 2020-12-30 ENCOUNTER — Telehealth (HOSPITAL_COMMUNITY): Payer: Self-pay | Admitting: Licensed Clinical Social Worker

## 2020-12-30 ENCOUNTER — Other Ambulatory Visit: Payer: Self-pay

## 2020-12-30 NOTE — Telephone Encounter (Signed)
CSW informed that pt had only been granted one time usage of the Heart Failure fund but was not a patient with the Heart Failure Clinic so would not be able to utilize moving forward.  CSW called pt and informed of above- pt confirmed he still had number for [facility] and CSW encouraged him to call and discuss enrollment with Medicare part D  informed pt he can still call me for assistance if he runs into concerns- pt expressed understanding  Burna Sis, LCSW Clinical Social Worker [facility] Desk#: [phone] Cell#: [phone]

## 2020-12-31 NOTE — Telephone Encounter (Signed)
For next fills, patient may need to look at [employer] or the [facility]

## 2020-12-31 NOTE — Telephone Encounter (Signed)
December 30, 2020 Burna Sis, [place]     11:22 AM Note CSW informed that pt had only been granted one time usage of the Heart Failure fund but was not a patient with the Heart Failure Clinic so would not be able to utilize moving forward.   CSW called pt and informed of above- pt confirmed he still had number for [facility] and CSW encouraged him to call and discuss enrollment with Medicare part D   informed pt he can still call me for assistance if he runs into concerns- pt expressed understanding   Burna Sis, LCSW Clinical Social Worker [facility] Desk#: [phone] Cell#: [phone]

## 2020-12-31 NOTE — Telephone Encounter (Signed)
Deakon, Frix - 12/24/2020  3:09 PM Marcy Siren, LCSW  Sent: Tue December 31, 2020  2:16 PM  To: Loa Socks, LPN; Meriam Sprague, MD          Message  Patient turned 65 this month and may be eligible for Medicare D as well as provided the [facility] number to call to see about the extra help program which will assist with medication costs. He was encouraged to call [facility] and return call to Rosetta Posner our social worker if further questions. He should have reduced medication costs with extra help program.   Hope this helps.  Annice Pih

## 2021-01-01 ENCOUNTER — Other Ambulatory Visit: Payer: Self-pay

## 2021-01-06 ENCOUNTER — Other Ambulatory Visit: Payer: Self-pay

## 2021-01-21 HISTORY — PX: EYE SURGERY: SHX253

## 2021-01-27 ENCOUNTER — Other Ambulatory Visit (HOSPITAL_COMMUNITY): Payer: Self-pay

## 2021-01-30 ENCOUNTER — Telehealth: Payer: Self-pay | Admitting: Cardiology

## 2021-01-30 ENCOUNTER — Other Ambulatory Visit: Payer: Self-pay | Admitting: Urology

## 2021-01-30 NOTE — Progress Notes (Deleted)
Patient medical history reviewed with dr Molly Maduro fitzgerald mda and patient is ok for [facility] surgery of penile prosthesis implant with in person cardiac clearance done and blood thinner clearance done prior to surgery per dr Molly Maduro fitzgerald mda, [facility] made aware.

## 2021-01-30 NOTE — Telephone Encounter (Signed)
  [facility] Pre-operative Risk Assessment    Patient Name: Curtis Watkins  DOB: [birth year] MRN: [id number]  HEARTCARE STAFF:  - IMPORTANT!!!!!! Under Visit Info/Reason for Call, type in Other and utilize the format Clearance MM/DD/YY or Clearance TBD. Do not use dashes or single digits. - Please review there is not already an duplicate clearance open for this procedure. - If request is for dental extraction, please clarify the # of teeth to be extracted. - If the patient is currently at the dentist's office, call Pre-Op Callback Staff (MA/nurse) to input urgent request.  - If the patient is not currently in the dentist office, please route to the Pre-Op pool.  Request for surgical clearance:  What type of surgery is being performed? Penile prosthesis  When is this surgery scheduled? 03/03/21  What type of clearance is required (medical clearance vs. Pharmacy clearance to hold med vs. Both)? Medical  Are there any medications that need to be held prior to surgery and how long? none  Practice name and name of physician performing surgery? Dr. Link Snuffer - [facility]  What is the office phone number? Hilda   7.   What is the office fax number? [phone]  8.   Anesthesia type (None, local, MAC, general) ? General  Per Pam, their anesthesiologist want pt to be seen by cards first    [place] 01/30/2021, 4:33 PM  _________________________________________________________________   (provider comments below)

## 2021-01-30 NOTE — Telephone Encounter (Signed)
Plavix needs to be held for 5 days

## 2021-01-31 NOTE — Telephone Encounter (Signed)
I s/w the pt and he is agreeable to plan of care for appt needed for pre op clearance. Pt has been scheduled to see Edd Fabian, FNP at our [place] location 02/05/21 @ 8:50. Pt agreeable to appt location. Pt asked for me to please call him back and leave him a vm with the address, date and time of the appt. I will forward notes to FNP for upcoming appt.will send FYI to requesting office pt has appt 02/05/21.

## 2021-01-31 NOTE — Telephone Encounter (Signed)
   Name: Curtis Watkins  DOB: Oct 16, 1955  MRN: [id number]  Primary Cardiologist: Meriam Sprague, MD  Chart reviewed as part of pre-operative protocol coverage. Because of Curtis Watkins's past medical history and time since last visit, he will require a follow-up visit in order to better assess preoperative cardiovascular risk. Last OV 08/2020, recommended to follow-up in 6 months (02/2021).  Per pre-op request, their anesthesiologist want pt to be seen by cards first. Surgery scheduled 03/03/21 so can see in early January.  Pre-op covering staff: - Please schedule appointment and call patient to inform them. - Please contact requesting surgeon's office via preferred method (i.e, phone, fax) to inform them of need for appointment prior to surgery.  This message will also be routed to primary cardiologist for input on holding anticoagulant/antiplatelet agent as requested below so that this information is available to the clearing provider at time of patient's appointment. Will route this msg to Dr. Shari Prows to get preliminary input that if patient is not having any issues at his f/u OV, may be please hold his Plavix for 5 days for this procedure? Has h/o NSTEMI/CABG 03/2020 and HFrEF.  Laurann [place], PA-C  01/31/2021, 11:30 AM

## 2021-02-03 NOTE — Progress Notes (Addendum)
Spoke with Curtis Watkins mda  on 01-31-2021 and Curtis Watkins is ok for [facility] surgery of penile prosthesis implant with in person cardiac clearance and blood thinner clearance done prior to surgery per dr Molly Maduro Watkins mda, pam gibson  at [facility] urology aware.   Spoke w/ via phone for pre-op interview---Curtis Watkins Lab needs dos----   I stat            COVID test -----patient states asymptomatic no test needed Arrive at -------900 am 03-03-2021 NPO after MN NO Solid Food.  Clear liquids from MN until---800 am Med rec completed Medications to take morning of surgery -----atorvastatin, metoprolol succinate eye drop Diabetic medication -----none day of surgery Patient instructed no nail polish to be worn day of surgery Patient instructed to bring photo id and insurance card day of surgery Patient aware to have Driver (ride ) / caregiver    for 24 hours after surgery  driver home after ower [name] sign other Patient Special Instructions -----Curtis Watkins given overnight stay instructions Pre-Op special Istructions -----hibiclens shower am of surgery chin to toes Patient verbalized understanding of instructions that were given at this phone interview. Patient denies shortness of breath, chest pain, fever, cough at this phone interview.    Anesthesia Review:cad s/p cabg x 4, chronic systolic heart failure with ef  45 % per echo 08-20-2020 epic, htn, icm, recurrent pleural effusions, pad, ckd stage 3 A, type 2 DM, has cardiac clearance note emily monge pa 02-05-2021 chart/epic, note to stop plavix 02-05-2021 dr Laurance Flatten note chart/epic, Curtis Watkins denies any cardiac s & s or sob at preop phone call   Endocrinology: sean ellison lov 02-07-2021 epic Cardiologist : dr Laurance Flatten lov Chest x-ray :06-20-2020 epic EKG :02-05-2021 chart/epic Echo :08-20-2020 epic Stress test:none Cardiac Cath : 03-29-2020 epic Peripheral vascular cath 03-29-2020 epic Activity level: works full time can do all activities without  restrictions Sleep Study/ CPAP :none Fasting Blood Sugar : 119-130     / Checks Blood Sugar -1 time a day:   Hemaglobin A1C 02-07-2021 epic Blood Thinner/ Instructions /Last Dose:hold plavix 5 to 7 days last dose 02-26-2021 Curtis Watkins aware per dr Shari Prows note 02-05-2022 chart/epic ASA / Instructions/ Last Dose :  81 mg aspirin last dose is day before surgery 03-02-2021 Curtis Watkins aware

## 2021-02-04 ENCOUNTER — Encounter (HOSPITAL_BASED_OUTPATIENT_CLINIC_OR_DEPARTMENT_OTHER): Payer: Self-pay | Admitting: General Practice

## 2021-02-04 NOTE — Progress Notes (Addendum)
Office Visit    Patient Name: Curtis Watkins Date of Encounter: 02/06/2021  Primary Care Provider:  Rema Fendt, NP Primary Cardiologist:  Meriam Sprague, MD  Chief Complaint   65 year old male with a history of CAD s/p CABG x4, chronic systolic heart failure c/ improved EF, ICM, recurrent pleural effusions, hypertension, carotid artery disease, PAD, tobacco use, CKD 3a, and type 2 diabetes who presents for pre-op clearance for penile prosthesis at the request of Dr. Alvester Morin with [facility].   Past Medical History    Past Medical History:  Diagnosis Date   Carotid artery disease (HCC)    04/01/20: 60-79% RICA stenosis, 1-39% LICA stenosis   Coronary artery disease    NSTEMI s/p CABG in 03/2020 (LIMA-->LAD/diag, L radial-->OM1/OM3); b.   Diabetes mellitus without complication (HCC)    HFrEF (heart failure with reduced ejection fraction) (HCC)    Echo 4/22: EF 25-30, global HK, small effusion without tamponade // Echo 6/22: EF 45, global HK, mild MR, RVSP 31.9   Hypertension    Ischemic cardiomyopathy    PAD (peripheral artery disease) (HCC)    occluded right axillary artery 03/29/20    Past Surgical History:  Procedure Laterality Date   AMPUTATION Right 04/24/2020   Procedure: RIGHT 2ND AND 3RD TOE AMPUTATION;  Surgeon: Nadara Mustard, MD;  Location: [facility] OR;  Service: Orthopedics;  Laterality: Right;   CENTRAL VENOUS CATHETER INSERTION Left 04/03/2020   Procedure: INSERTION OF FEMORAL ARTERIAL LINE;  Surgeon: Linden Dolin, MD;  Location: [facility] OR;  Service: Open Heart Surgery;  Laterality: Left;   CORONARY ARTERY BYPASS GRAFT N/A 04/03/2020   Procedure: CORONARY ARTERY BYPASS GRAFTING (CABG) TIMES FOUR USING LEFT INTERNAL MAMMARY ARTERY AND LEFT RADIAL ARTERY.;  Surgeon: Linden Dolin, MD;  Location: [facility] OR;  Service: Open Heart Surgery;  Laterality: N/A;   CORONARY PRESSURE WIRE/FFR WITH 3D MAPPING N/A 03/29/2020   Procedure: Coronary Pressure Wire/FFR w/3D Mapping;   Surgeon: Lyn Records, MD;  Location: [facility] INVASIVE CV LAB;  Service: Cardiovascular;  Laterality: N/A;   IR THORACENTESIS ASP PLEURAL SPACE W/IMG GUIDE  05/03/2020   IR THORACENTESIS ASP PLEURAL SPACE W/IMG GUIDE  05/16/2020   LEFT HEART CATH AND CORONARY ANGIOGRAPHY N/A 03/29/2020   Procedure: LEFT HEART CATH AND CORONARY ANGIOGRAPHY;  Surgeon: Lyn Records, MD;  Location: [facility] LAB;  Service: Cardiovascular;  Laterality: N/A;   RADIAL ARTERY HARVEST Left 04/03/2020   Procedure: LEFT RADIAL ARTERY HARVEST;  Surgeon: Linden Dolin, MD;  Location: [facility] OR;  Service: Open Heart Surgery;  Laterality: Left;   TEE WITHOUT CARDIOVERSION N/A 04/03/2020   Procedure: TRANSESOPHAGEAL ECHOCARDIOGRAM (TEE);  Surgeon: Linden Dolin, MD;  Location: [facility] OR;  Service: Open Heart Surgery;  Laterality: N/A;   UPPER EXTREMITY ANGIOGRAPHY  03/29/2020   Procedure: Upper Extremity Angiography;  Surgeon: Lyn Records, MD;  Location: [facility] INVASIVE CV LAB;  Service: Cardiovascular;;  rt.  arm    Allergies  No Known Allergies  History of Present Illness    65 year old male with the above past medical history including CAD s/p CABG x4, chronic systolic heart failure c/ improved EF, ICM, recurrent pleural effusions, hypertension, carotid artery disease, PAD, former tobacco use, CKD 3a, and type 2 diabetes.  Echo on 04/14/2020 showed improved EF 45%.  He was last seen in the office on 09/05/2020 was doing well overall from a cardiac standpoint on GDMT. He dnied angina or shortness  of breath and was working on quitting smoking at the time.  He presents today for follow-up and for cardiac clearance for upcoming penile prosthesis surgery.  He is doing well overall from a cardiac standpoint.  He denies any symptoms of angina.  Weights have been stable at home.  He is taking all of his medications as prescribed.  He has quit smoking and remains active.  He denies any concerns today.  Home Medications    Current  Outpatient Medications  Medication Sig Dispense Refill   aspirin 81 MG tablet Take 1 tablet (81 mg total) by mouth daily.     atorvastatin (LIPITOR) 80 MG tablet Take 1 tablet (80 mg total) by mouth daily. 90 tablet 1   brimonidine (ALPHAGAN) 0.2 % ophthalmic solution Place 1 drop into the right eye as needed.     brimonidine (ALPHAGAN) 0.2 % ophthalmic solution Place 1 drop into the left eye 2 times daily.     clopidogrel (PLAVIX) 75 MG tablet Take 1 tablet (75 mg total) by mouth daily. 90 tablet 1   dorzolamide-timolol (COSOPT) 22.3-6.8 MG/ML ophthalmic solution Place 1 drop into the left eye 2 times daily.     empagliflozin (JARDIANCE) 25 MG TABS tablet Take 1 tablet (25 mg total) by mouth daily before breakfast. 30 tablet 2   furosemide (LASIX) 20 MG tablet Take 1 tablet (20 mg total) by mouth daily. 90 tablet 1   losartan (COZAAR) 25 MG tablet Take 0.5 tablets (12.5 mg total) by mouth daily. 45 tablet 1   metoprolol succinate (TOPROL XL) 25 MG 24 hr tablet Take 1.5 tablets (37.5 mg total) by mouth daily. 135 tablet 1   OVER THE COUNTER MEDICATION Apply 1 application topically daily. Diabetic lotion     Semaglutide,0.25 or 0.5MG /DOS, (OZEMPIC, 0.25 OR 0.5 MG/DOSE,) 2 MG/1.5ML SOPN Inject 0.5 mg into the skin once a week. 1.5 mL 2   spironolactone (ALDACTONE) 25 MG tablet Take 1/2 tablet (12.5 mg total) by mouth daily. 45 tablet 2   No current facility-administered medications for this visit.     Review of Systems    He denies chest pain, palpitations, dyspnea, pnd, orthopnea, n, v, dizziness, syncope, edema, weight gain, or early satiety. All other systems reviewed and are otherwise negative except as noted above.   Physical Exam    VS:  BP 130/74   Pulse 70   Ht 5\' 11"  (1.803 m)   Wt 171 lb 8 oz (77.8 kg)   BMI 23.92 kg/m      GEN: Well nourished, well developed, in no acute distress. HEENT: normal. Neck: Faint bilateral carotid bruits. Supple, no JVD, or masses. Cardiac: RRR,  1/6 systolic murmur, no rubs, or gallops. No clubbing, cyanosis, edema.  Radials/DP/PT 2+ and equal bilaterally.  Respiratory:  Respirations regular and unlabored, clear to auscultation bilaterally. GI: Soft, nontender, nondistended, BS + x 4. MS: no deformity or atrophy. Skin: warm and dry, no rash. Neuro:  Strength and sensation are intact. Psych: Normal affect.  Accessory Clinical Findings    ECG personally reviewed by me today - NSR, 70 bpm, LVH, non-specific T wave abnormality, noted on prior EKG - no acute changes.  Lab Results  Component Value Date   WBC 8.0 08/02/2020   HGB 11.4 (L) 08/02/2020   HCT 34.9 (L) 08/02/2020   MCV 89 08/02/2020   PLT 237 08/02/2020   Lab Results  Component Value Date   CREATININE 1.60 (H) 08/02/2020   BUN 31 (H) 08/02/2020  NA 142 08/02/2020   K 4.3 08/02/2020   CL 104 08/02/2020   CO2 19 (L) 08/02/2020   Lab Results  Component Value Date   ALT 15 08/02/2020   AST 14 08/02/2020   ALKPHOS 90 08/02/2020   BILITOT 0.3 08/02/2020   Lab Results  Component Value Date   CHOL 100 08/02/2020   HDL 52 08/02/2020   LDLCALC 35 08/02/2020   TRIG 55 08/02/2020   CHOLHDL 1.9 08/02/2020    Lab Results  Component Value Date   HGBA1C 6.7 (A) 11/01/2020    Assessment & Plan    1. CAD c/ LDL goal <70: s/p CABG x4 02/5724. Stable with no anginal symptoms. No indication for further ischemic evaluation at this time. Prior EF 25-30%, improved to 45% on most recent echo on 07/2020. LDL was 35 on 08/02/20. Continue GDMT with ASA, Plavix, Lipitor, spironolactone, Toprol-XL, losartan, Lasix, and Jardiance. I will reach out to Dr. Shari Prows for recommendations on holding Plavix prior to upcoming surgery.  2. Chronic systolic heart failure c/ improved EF/ICM/Recurrent pleural effusions: Most recent echo as above. Euvolemic and well compensated on exam. Continue GDMT as above.   3. Hypertension: BP well controlled. Continue current antihypertensive regimen.    4. Carotid artery disease: RICA 60-79%/LICA 1-39% noted on pre-CABG dopplers on 03/2020. Bilateral bruits on exam. He denies dizziness, syncope. Consider repeat carotid dopplers at next follow-up visit.   5. Former tobacco use: He has quit smoking. I congratulated him on this accomplishment.   6. Pre-op clearance: According to the Revised Cardiac Risk Index (RCRI), his Perioperative Risk of Major Cardiac Event is (%): 6.6. His Functional Capacity in METs is: 8.97 according to the [employer] Activity Status Index (DASI). Therefore, based on ACC/AHA guidelines, patient would be at acceptable risk for the planned procedure without further cardiovascular testing.  I will reach out to Dr. Shari Prows for recommendations on holding Plavix prior to procedure. Pending her recommendations, I will route this recommendation to the requesting party via Epic fax function and clear the patient for surgery.   7. Disposition: Surgical clearance pending recommendations for holding anti-platelet therapy. Follow-up in 6 months.    8. Addendum 02/06/21: Following conversation with Dr. Shari Prows, ok for pt to hold Plavix 5-7 days prior to surgery. Please continue ASA 81 mg daily and resume Plavix as soon as possible after surgery. I will route this recommendation to the requesting party via Epic fax function and clear the patient for surgery.   Joylene Grapes, NP 02/06/2021, 7:45 AM

## 2021-02-05 ENCOUNTER — Ambulatory Visit (INDEPENDENT_AMBULATORY_CARE_PROVIDER_SITE_OTHER): Payer: Medicare Other | Admitting: Nurse Practitioner

## 2021-02-05 ENCOUNTER — Encounter (HOSPITAL_BASED_OUTPATIENT_CLINIC_OR_DEPARTMENT_OTHER): Payer: Self-pay | Admitting: General Practice

## 2021-02-05 ENCOUNTER — Other Ambulatory Visit: Payer: Self-pay

## 2021-02-05 VITALS — BP 130/74 | HR 70 | Ht 71.0 in | Wt 171.5 lb

## 2021-02-05 DIAGNOSIS — I10 Essential (primary) hypertension: Secondary | ICD-10-CM

## 2021-02-05 DIAGNOSIS — Z951 Presence of aortocoronary bypass graft: Secondary | ICD-10-CM | POA: Diagnosis not present

## 2021-02-05 DIAGNOSIS — I2510 Atherosclerotic heart disease of native coronary artery without angina pectoris: Secondary | ICD-10-CM

## 2021-02-05 DIAGNOSIS — Z01810 Encounter for preprocedural cardiovascular examination: Secondary | ICD-10-CM

## 2021-02-05 DIAGNOSIS — I779 Disorder of arteries and arterioles, unspecified: Secondary | ICD-10-CM

## 2021-02-05 DIAGNOSIS — Z87891 Personal history of nicotine dependence: Secondary | ICD-10-CM

## 2021-02-05 DIAGNOSIS — I5022 Chronic systolic (congestive) heart failure: Secondary | ICD-10-CM

## 2021-02-05 NOTE — Addendum Note (Signed)
Addended by: Marlene Lard on: 02/05/2021 04:46 PM   Modules accepted: Orders

## 2021-02-05 NOTE — Patient Instructions (Signed)
Medication Instructions:  Your Physician recommend you continue on your current medication as directed.    *If you need a refill on your cardiac medications before your next appointment, please call your pharmacy*   Lab Work: None ordered today     Testing/Procedures: None ordered today    Follow-Up: At [facility], you and your health needs are our priority.  As part of our continuing mission to provide you with exceptional heart care, we have created designated Provider Care Teams.  These Care Teams include your primary Cardiologist (physician) and Advanced Practice Providers (APPs -  Physician Assistants and Nurse Practitioners) who all work together to provide you with the care you need, when you need it.  We recommend signing up for the patient portal called "MyChart".  Sign up information is provided on this After Visit Summary.  MyChart is used to connect with patients for Virtual Visits (Telemedicine).  Patients are able to view lab/test results, encounter notes, upcoming appointments, etc.  Non-urgent messages can be sent to your provider as well.   To learn more about what you can do with MyChart, go to [email or web address].    Your next appointment:   6 month(s)  The format for your next appointment:   In Person  Provider:   Meriam Sprague, MD     Other Instructions We will call you to advise on Plavix dosing!

## 2021-02-07 ENCOUNTER — Other Ambulatory Visit: Payer: Self-pay

## 2021-02-07 ENCOUNTER — Ambulatory Visit (INDEPENDENT_AMBULATORY_CARE_PROVIDER_SITE_OTHER): Payer: Medicare Other | Admitting: Endocrinology

## 2021-02-07 ENCOUNTER — Encounter: Payer: Self-pay | Admitting: Endocrinology

## 2021-02-07 VITALS — BP 124/78 | HR 70 | Ht 71.0 in | Wt 171.4 lb

## 2021-02-07 DIAGNOSIS — E1100 Type 2 diabetes mellitus with hyperosmolarity without nonketotic hyperglycemic-hyperosmolar coma (NKHHC): Secondary | ICD-10-CM | POA: Diagnosis not present

## 2021-02-07 LAB — POCT GLYCOSYLATED HEMOGLOBIN (HGB A1C): Hemoglobin A1C: 6.7 % — AB (ref 4.0–5.6)

## 2021-02-07 MED ORDER — OZEMPIC (0.25 OR 0.5 MG/DOSE) 2 MG/1.5ML ~~LOC~~ SOPN
0.5000 mg | PEN_INJECTOR | SUBCUTANEOUS | 2 refills | Status: DC
  Filled 2021-02-07: qty 1.5, 28d supply, fill #0
  Filled 2021-03-04: qty 4.5, 84d supply, fill #0

## 2021-02-07 NOTE — Patient Instructions (Addendum)
check your blood sugar once a day.  vary the time of day when you check, between before the 3 meals, and at bedtime.  also check if you have symptoms of your blood sugar being too high or too low.  please keep a record of the readings and bring it to your next appointment here (or you can bring the meter itself).  You can write it on any piece of paper.  please call us sooner if your blood sugar goes below 70, or if most of your readings are over 200.   Please continue the same Jardiance.   I have sent a prescription to your pharmacy, for the Ozempic.   Please come back for a follow-up appointment in 3-4 months.

## 2021-02-07 NOTE — Progress Notes (Signed)
Subjective:    Patient ID: Curtis Watkins, male    DOB: [birth year], 65 y.o.   MRN: [id number]  HPI Pt returns for f/u of diabetes mellitus: DM type: 2 (but he is prob evolving type 1) Dx'ed: [id number] Complications: stage 3 CRI, CAD, toe amputations, PN, and foot ulcers Therapy: Ozempic and Jardiance DKA: never Severe hypoglycemia: never Pancreatitis: never Pancreatic imaging: none known SDOH: He gets meds from [employer], for free; He has a part time cleaning service Other: he has never been on insulin Interval history: He has not recently taken Ozempic.  Pt says cbg is in the low-100's.  pt states he feels well in general. Past Medical History:  Diagnosis Date   Carotid artery disease ([place])    04/01/20: 60-79% RICA stenosis, 123456 LICA stenosis   Coronary artery disease    NSTEMI s/p CABG in 03/2020 (LIMA-->LAD/diag, L radial-->OM1/OM3); b.   Diabetes mellitus without complication (HCC)    HFrEF (heart failure with reduced ejection fraction) ([place])    Echo 4/22: EF 25-30, global HK, small effusion without tamponade // Echo 6/22: EF 45, global HK, mild MR, RVSP 31.9   Hypertension    Ischemic cardiomyopathy    PAD (peripheral artery disease) (HCC)    occluded right axillary artery 03/29/20     Past Surgical History:  Procedure Laterality Date   AMPUTATION Right 04/24/2020   Procedure: RIGHT 2ND AND 3RD TOE AMPUTATION;  Surgeon: Newt Minion, MD;  Location: [facility];  Service: Orthopedics;  Laterality: Right;   CENTRAL VENOUS CATHETER INSERTION Left 04/03/2020   Procedure: INSERTION OF FEMORAL ARTERIAL LINE;  Surgeon: Wonda Olds, MD;  Location: [facility];  Service: Open Heart Surgery;  Laterality: Left;   CORONARY ARTERY BYPASS GRAFT N/A 04/03/2020   Procedure: CORONARY ARTERY BYPASS GRAFTING (CABG) TIMES FOUR USING LEFT INTERNAL MAMMARY ARTERY AND LEFT RADIAL ARTERY.;  Surgeon: Wonda Olds, MD;  Location: [facility];  Service: Open Heart Surgery;  Laterality: N/A;   CORONARY  PRESSURE WIRE/FFR WITH 3D MAPPING N/A 03/29/2020   Procedure: Coronary Pressure Wire/FFR w/3D Mapping;  Surgeon: Belva Crome, MD;  Location: [facility];  Service: Cardiovascular;  Laterality: N/A;   IR THORACENTESIS ASP PLEURAL SPACE W/IMG GUIDE  05/03/2020   IR THORACENTESIS ASP PLEURAL SPACE W/IMG GUIDE  05/16/2020   LEFT HEART CATH AND CORONARY ANGIOGRAPHY N/A 03/29/2020   Procedure: LEFT HEART CATH AND CORONARY ANGIOGRAPHY;  Surgeon: Belva Crome, MD;  Location: [facility];  Service: Cardiovascular;  Laterality: N/A;   RADIAL ARTERY HARVEST Left 04/03/2020   Procedure: LEFT RADIAL ARTERY HARVEST;  Surgeon: Wonda Olds, MD;  Location: [facility];  Service: Open Heart Surgery;  Laterality: Left;   TEE WITHOUT CARDIOVERSION N/A 04/03/2020   Procedure: TRANSESOPHAGEAL ECHOCARDIOGRAM (TEE);  Surgeon: Wonda Olds, MD;  Location: [facility];  Service: Open Heart Surgery;  Laterality: N/A;   UPPER EXTREMITY ANGIOGRAPHY  03/29/2020   Procedure: Upper Extremity Angiography;  Surgeon: Belva Crome, MD;  Location: [facility];  Service: Cardiovascular;;  rt.  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/t medical conditions  Tobacco Use   Smoking status: Some Days  Packs/day: 0.25    Years: 2.00    Pack years: 0.50    Types: Cigarettes   Smokeless tobacco: Never  Vaping Use   Vaping Use: Never used  Substance and Sexual Activity   Alcohol use: Not Currently    Alcohol/week: 1.0 standard drink    Types: 1 Cans of beer per week    Comment: occassionally   Drug use: Not Currently    Frequency: 3.0 times per week    Types: Marijuana   Sexual activity: Not Currently  Other Topics Concern   Not on file  Social History Narrative   Not on file    Social Determinants of Health   Financial Resource Strain: High Risk   Difficulty of Paying Living Expenses: Hard  Food Insecurity: No Food Insecurity   Worried About Running Out of Food in the Last Year: Never true   Ran Out of Food in the Last Year: Never true  Transportation Needs: Unmet Transportation Needs   Lack of Transportation (Medical): Yes   Lack of Transportation (Non-Medical): No  Physical Activity: Inactive   Days of Exercise per Week: 0 days   Minutes of Exercise per Session: 0 min  Stress: Not on file  Social Connections: Not on file  Intimate Partner Violence: Not on file    Current Outpatient Medications on File Prior to Visit  Medication Sig Dispense Refill   aspirin 81 MG tablet Take 1 tablet (81 mg total) by mouth daily.     atorvastatin (LIPITOR) 80 MG tablet Take 1 tablet (80 mg total) by mouth daily. 90 tablet 1   brimonidine (ALPHAGAN) 0.2 % ophthalmic solution Place 1 drop into the right eye as needed.     brimonidine (ALPHAGAN) 0.2 % ophthalmic solution Place 1 drop into the left eye 2 times daily.     clopidogrel (PLAVIX) 75 MG tablet Take 1 tablet (75 mg total) by mouth daily. 90 tablet 1   dorzolamide-timolol (COSOPT) 22.3-6.8 MG/ML ophthalmic solution Place 1 drop into the left eye 2 times daily.     empagliflozin (JARDIANCE) 25 MG TABS tablet Take 1 tablet (25 mg total) by mouth daily before breakfast. 30 tablet 2   furosemide (LASIX) 20 MG tablet Take 1 tablet (20 mg total) by mouth daily. 90 tablet 1   losartan (COZAAR) 25 MG tablet Take 0.5 tablets (12.5 mg total) by mouth daily. 45 tablet 1   metoprolol succinate (TOPROL XL) 25 MG 24 hr tablet Take 1.5 tablets (37.5 mg total) by mouth daily. 135 tablet 1   OVER THE COUNTER MEDICATION Apply 1 application topically daily. Diabetic lotion     spironolactone (ALDACTONE) 25 MG tablet Take 1/2 tablet (12.5 mg total) by mouth daily. 45 tablet 2   No current facility-administered medications on file  prior to visit.    No Known Allergies  Family History  Problem Relation Age of Onset   Diabetes Mother     BP 124/78 (BP Location: Left Arm, Patient Position: Sitting, Cuff Size: Normal)    Pulse 70    Ht 5\' 11"  (1.803 m)    Wt 171 lb 6.4 oz (77.7 kg)    SpO2 100%    BMI 23.91 kg/m   Review of Systems     Objective:   Physical Exam VITAL SIGNS:  See vs page GENERAL: no distress EXT: no leg edema.    Lab Results  Component Value Date   CREATININE 1.60 (H) 08/02/2020   BUN 31 (H) 08/02/2020  NA 142 08/02/2020   K 4.3 08/02/2020   CL 104 08/02/2020   CO2 19 (L) 08/02/2020    Lab Results  Component Value Date   HGBA1C 6.7 (A) 02/07/2021      Assessment & Plan:  Type 2 DM: uncontrolled, as goal A1c is lower  Patient Instructions  check your blood sugar once a day.  vary the time of day when you check, between before the 3 meals, and at bedtime.  also check if you have symptoms of your blood sugar being too high or too low.  please keep a record of the readings and bring it to your next appointment here (or you can bring the meter itself).  You can write it on any piece of paper.  please call us sooner if your blood sugar goes below 70, or if most of your readings are over 200.   Please continue the same Jardiance.   I have sent a prescription to your pharmacy, for the [facility].   Please come back for a follow-up appointment in 3-4 months.

## 2021-02-14 ENCOUNTER — Other Ambulatory Visit: Payer: Self-pay

## 2021-02-24 ENCOUNTER — Other Ambulatory Visit (HOSPITAL_COMMUNITY): Payer: Self-pay

## 2021-02-26 ENCOUNTER — Encounter (HOSPITAL_BASED_OUTPATIENT_CLINIC_OR_DEPARTMENT_OTHER): Payer: Self-pay | Admitting: Urology

## 2021-02-26 ENCOUNTER — Other Ambulatory Visit: Payer: Self-pay

## 2021-02-28 ENCOUNTER — Other Ambulatory Visit: Payer: Self-pay

## 2021-03-03 ENCOUNTER — Ambulatory Visit (HOSPITAL_BASED_OUTPATIENT_CLINIC_OR_DEPARTMENT_OTHER): Payer: Medicare Other | Admitting: Anesthesiology

## 2021-03-03 ENCOUNTER — Encounter (HOSPITAL_BASED_OUTPATIENT_CLINIC_OR_DEPARTMENT_OTHER)
Admission: RE | Disposition: A | Discharge status: Home / Self Care | Payer: Self-pay | Source: Home / Self Care | Attending: Urology

## 2021-03-03 ENCOUNTER — Encounter (HOSPITAL_BASED_OUTPATIENT_CLINIC_OR_DEPARTMENT_OTHER): Payer: Self-pay | Admitting: Urology

## 2021-03-03 ENCOUNTER — Observation Stay (HOSPITAL_BASED_OUTPATIENT_CLINIC_OR_DEPARTMENT_OTHER)
Admission: RE | Admit: 2021-03-03 | Discharge: 2021-03-04 | Disposition: A | Discharge status: Home / Self Care | Payer: Medicare Other | Attending: Urology | Admitting: Urology

## 2021-03-03 DIAGNOSIS — F17200 Nicotine dependence, unspecified, uncomplicated: Secondary | ICD-10-CM | POA: Diagnosis not present

## 2021-03-03 DIAGNOSIS — N529 Male erectile dysfunction, unspecified: Secondary | ICD-10-CM | POA: Diagnosis present

## 2021-03-03 DIAGNOSIS — Z7902 Long term (current) use of antithrombotics/antiplatelets: Secondary | ICD-10-CM | POA: Insufficient documentation

## 2021-03-03 DIAGNOSIS — Z7982 Long term (current) use of aspirin: Secondary | ICD-10-CM | POA: Insufficient documentation

## 2021-03-03 DIAGNOSIS — E119 Type 2 diabetes mellitus without complications: Secondary | ICD-10-CM | POA: Diagnosis not present

## 2021-03-03 DIAGNOSIS — Z7984 Long term (current) use of oral hypoglycemic drugs: Secondary | ICD-10-CM | POA: Diagnosis not present

## 2021-03-03 DIAGNOSIS — Z79899 Other long term (current) drug therapy: Secondary | ICD-10-CM | POA: Diagnosis not present

## 2021-03-03 DIAGNOSIS — N5201 Erectile dysfunction due to arterial insufficiency: Secondary | ICD-10-CM | POA: Diagnosis not present

## 2021-03-03 DIAGNOSIS — N400 Enlarged prostate without lower urinary tract symptoms: Secondary | ICD-10-CM | POA: Diagnosis not present

## 2021-03-03 HISTORY — DX: Chronic kidney disease, unspecified: N18.9

## 2021-03-03 HISTORY — PX: PENILE PROSTHESIS IMPLANT: SHX240

## 2021-03-03 HISTORY — DX: Unspecified glaucoma: H40.9

## 2021-03-03 HISTORY — DX: Blindness, one eye, unspecified eye: H54.40

## 2021-03-03 LAB — POCT I-STAT, CHEM 8
BUN: 40 mg/dL — ABNORMAL HIGH (ref 8–23)
Calcium, Ion: 1.24 mmol/L (ref 1.15–1.40)
Chloride: 104 mmol/L (ref 98–111)
Creatinine, Ser: 1.9 mg/dL — ABNORMAL HIGH (ref 0.61–1.24)
Glucose, Bld: 134 mg/dL — ABNORMAL HIGH (ref 70–99)
HCT: 36 % — ABNORMAL LOW (ref 39.0–52.0)
Hemoglobin: 12.2 g/dL — ABNORMAL LOW (ref 13.0–17.0)
Potassium: 4.4 mmol/L (ref 3.5–5.1)
Sodium: 141 mmol/L (ref 135–145)
TCO2: 23 mmol/L (ref 22–32)

## 2021-03-03 LAB — GLUCOSE, CAPILLARY
Glucose-Capillary: 154 mg/dL — ABNORMAL HIGH (ref 70–99)
Glucose-Capillary: 344 mg/dL — ABNORMAL HIGH (ref 70–99)
Glucose-Capillary: 298 mg/dL — ABNORMAL HIGH (ref 70–99)

## 2021-03-03 SURGERY — INSERTION, PENILE PROSTHESIS, INFLATABLE
Anesthesia: General | Site: Penis

## 2021-03-03 MED ORDER — METOPROLOL SUCCINATE 12.5 MG HALF TABLET
37.5000 mg | ORAL_TABLET | Freq: Every day | ORAL | Status: DC
  Filled 2021-03-03: qty 1

## 2021-03-03 MED ORDER — DEXAMETHASONE SODIUM PHOSPHATE 10 MG/ML IJ SOLN
INTRAMUSCULAR | Status: AC
  Filled 2021-03-03: qty 1

## 2021-03-03 MED ORDER — DEXAMETHASONE SODIUM PHOSPHATE 10 MG/ML IJ SOLN
INTRAMUSCULAR | Status: DC | PRN
  Administered 2021-03-03: 4 mg via INTRAVENOUS

## 2021-03-03 MED ORDER — INSULIN ASPART 100 UNIT/ML IJ SOLN
0.0000 [IU] | Freq: Three times a day (TID) | INTRAMUSCULAR | Status: DC
  Administered 2021-03-03: 11 [IU] via SUBCUTANEOUS
  Administered 2021-03-04: 3 [IU] via SUBCUTANEOUS

## 2021-03-03 MED ORDER — BACITRACIN-NEOMYCIN-POLYMYXIN 400-5-5000 EX OINT
1.0000 | TOPICAL_OINTMENT | Freq: Three times a day (TID) | CUTANEOUS | Status: DC | PRN
Start: 2021-03-03 — End: 2021-03-04

## 2021-03-03 MED ORDER — FENTANYL CITRATE (PF) 100 MCG/2ML IJ SOLN
INTRAMUSCULAR | Status: DC | PRN
Start: 2021-03-03 — End: 2021-03-03
  Administered 2021-03-03: 50 ug via INTRAVENOUS
  Administered 2021-03-03 (×3): 25 ug via INTRAVENOUS
  Administered 2021-03-03: 50 ug via INTRAVENOUS
  Administered 2021-03-03: 25 ug via INTRAVENOUS

## 2021-03-03 MED ORDER — ONDANSETRON HCL 4 MG/2ML IJ SOLN
INTRAMUSCULAR | Status: DC | PRN
Start: 2021-03-03 — End: 2021-03-03
  Administered 2021-03-03: 4 mg via INTRAVENOUS

## 2021-03-03 MED ORDER — PROPOFOL 10 MG/ML IV BOLUS
INTRAVENOUS | Status: DC | PRN
  Administered 2021-03-03: 30 mg via INTRAVENOUS
  Administered 2021-03-03: 100 mg via INTRAVENOUS

## 2021-03-03 MED ORDER — BELLADONNA ALKALOIDS-OPIUM 16.2-60 MG RE SUPP: 1.0000 | Freq: Four times a day (QID) | RECTAL | Status: DC | PRN

## 2021-03-03 MED ORDER — EPHEDRINE SULFATE-NACL 50-0.9 MG/10ML-% IV SOSY
PREFILLED_SYRINGE | INTRAVENOUS | Status: DC | PRN
  Administered 2021-03-03: 5 mg via INTRAVENOUS
  Administered 2021-03-03 (×3): 10 mg via INTRAVENOUS

## 2021-03-03 MED ORDER — VANCOMYCIN HCL IN DEXTROSE 1-5 GM/200ML-% IV SOLN
1000.0000 mg | INTRAVENOUS | Status: AC
  Administered 2021-03-03 (×2): 1000 mg via INTRAVENOUS

## 2021-03-03 MED ORDER — ATORVASTATIN CALCIUM 80 MG PO TABS
80.0000 mg | ORAL_TABLET | Freq: Every day | ORAL | Status: DC
  Filled 2021-03-03: qty 1

## 2021-03-03 MED ORDER — PHENYLEPHRINE HCL (PRESSORS) 10 MG/ML IV SOLN
INTRAVENOUS | Status: AC
  Filled 2021-03-03: qty 1

## 2021-03-03 MED ORDER — SODIUM CHLORIDE 0.9 % IV SOLN
INTRAVENOUS | Status: AC
  Administered 2021-03-03: 100 mL
  Filled 2021-03-03: qty 600

## 2021-03-03 MED ORDER — MORPHINE SULFATE (PF) 4 MG/ML IV SOLN: 2.0000 mg | INTRAVENOUS | Status: DC | PRN

## 2021-03-03 MED ORDER — ACETAMINOPHEN 325 MG PO TABS: 650.0000 mg | ORAL_TABLET | ORAL | Status: DC | PRN

## 2021-03-03 MED ORDER — SODIUM CHLORIDE 0.9 % IV SOLN: INTRAVENOUS | Status: DC

## 2021-03-03 MED ORDER — FUROSEMIDE 20 MG PO TABS
20.0000 mg | ORAL_TABLET | Freq: Every day | ORAL | Status: DC
  Administered 2021-03-03: 20 mg via ORAL
  Filled 2021-03-03: qty 1

## 2021-03-03 MED ORDER — INSULIN ASPART 100 UNIT/ML IJ SOLN
INTRAMUSCULAR | Status: AC
  Filled 2021-03-03: qty 1

## 2021-03-03 MED ORDER — ZOLPIDEM TARTRATE 5 MG PO TABS: 5.0000 mg | ORAL_TABLET | Freq: Every evening | ORAL | Status: DC | PRN

## 2021-03-03 MED ORDER — LOSARTAN POTASSIUM 25 MG PO TABS
12.5000 mg | ORAL_TABLET | Freq: Every day | ORAL | Status: DC
  Administered 2021-03-03: 12.5 mg via ORAL
  Filled 2021-03-03: qty 0.5

## 2021-03-03 MED ORDER — PHENYLEPHRINE HCL-NACL 20-0.9 MG/250ML-% IV SOLN
INTRAVENOUS | Status: DC | PRN
  Administered 2021-03-03: 20 ug/min via INTRAVENOUS

## 2021-03-03 MED ORDER — CEFAZOLIN SODIUM-DEXTROSE 2-4 GM/100ML-% IV SOLN
INTRAVENOUS | Status: AC
  Filled 2021-03-03: qty 100

## 2021-03-03 MED ORDER — SODIUM CHLORIDE 0.9 % IR SOLN
Status: DC | PRN
  Administered 2021-03-03: 500 mL

## 2021-03-03 MED ORDER — DIPHENHYDRAMINE HCL 12.5 MG/5ML PO ELIX: 12.5000 mg | ORAL_SOLUTION | Freq: Four times a day (QID) | ORAL | Status: DC | PRN

## 2021-03-03 MED ORDER — CEFAZOLIN SODIUM-DEXTROSE 2-4 GM/100ML-% IV SOLN
2.0000 g | INTRAVENOUS | Status: AC
  Administered 2021-03-03: 2 g via INTRAVENOUS

## 2021-03-03 MED ORDER — ASPIRIN EC 81 MG PO TBEC
81.0000 mg | DELAYED_RELEASE_TABLET | Freq: Every day | ORAL | Status: DC
  Filled 2021-03-03: qty 1

## 2021-03-03 MED ORDER — SODIUM CHLORIDE 0.9 % IV SOLN
INTRAVENOUS | Status: AC | PRN
  Administered 2021-03-03: 95 mL

## 2021-03-03 MED ORDER — GENTAMICIN SULFATE 40 MG/ML IJ SOLN
5.0000 mg/kg | INTRAVENOUS | Status: AC
  Administered 2021-03-03: 390 mg via INTRAVENOUS
  Filled 2021-03-03: qty 9.75

## 2021-03-03 MED ORDER — LIDOCAINE 2% (20 MG/ML) 5 ML SYRINGE
INTRAMUSCULAR | Status: DC | PRN
  Administered 2021-03-03: 70 mg via INTRAVENOUS

## 2021-03-03 MED ORDER — EPHEDRINE 5 MG/ML INJ
INTRAVENOUS | Status: AC
  Filled 2021-03-03: qty 5

## 2021-03-03 MED ORDER — ROCURONIUM BROMIDE 10 MG/ML (PF) SYRINGE
PREFILLED_SYRINGE | INTRAVENOUS | Status: AC
  Filled 2021-03-03: qty 10

## 2021-03-03 MED ORDER — OXYCODONE HCL 5 MG PO TABS: 5.0000 mg | ORAL_TABLET | ORAL | Status: DC | PRN

## 2021-03-03 MED ORDER — HYDROMORPHONE HCL 1 MG/ML IJ SOLN: 0.2500 mg | INTRAMUSCULAR | Status: DC | PRN

## 2021-03-03 MED ORDER — PHENYLEPHRINE 40 MCG/ML (10ML) SYRINGE FOR IV PUSH (FOR BLOOD PRESSURE SUPPORT)
PREFILLED_SYRINGE | INTRAVENOUS | Status: DC | PRN
  Administered 2021-03-03 (×2): 80 ug via INTRAVENOUS
  Administered 2021-03-03: 120 ug via INTRAVENOUS
  Administered 2021-03-03: 40 ug via INTRAVENOUS
  Administered 2021-03-03: 80 ug via INTRAVENOUS

## 2021-03-03 MED ORDER — SENNA 8.6 MG PO TABS: 1.0000 | ORAL_TABLET | Freq: Two times a day (BID) | ORAL | Status: DC

## 2021-03-03 MED ORDER — CEFAZOLIN SODIUM-DEXTROSE 2-4 GM/100ML-% IV SOLN
2.0000 g | Freq: Three times a day (TID) | INTRAVENOUS | Status: AC
  Administered 2021-03-03 – 2021-03-04 (×2): 2 g via INTRAVENOUS
  Filled 2021-03-03: qty 100

## 2021-03-03 MED ORDER — FENTANYL CITRATE (PF) 100 MCG/2ML IJ SOLN
INTRAMUSCULAR | Status: AC
  Filled 2021-03-03: qty 2

## 2021-03-03 MED ORDER — VANCOMYCIN HCL IN DEXTROSE 1-5 GM/200ML-% IV SOLN
INTRAVENOUS | Status: AC
  Filled 2021-03-03: qty 200

## 2021-03-03 MED ORDER — SPIRONOLACTONE 12.5 MG HALF TABLET
12.5000 mg | ORAL_TABLET | Freq: Every day | ORAL | Status: DC
  Administered 2021-03-03: 12.5 mg via ORAL
  Filled 2021-03-03: qty 1

## 2021-03-03 MED ORDER — MIDAZOLAM HCL 2 MG/2ML IJ SOLN
INTRAMUSCULAR | Status: AC
  Filled 2021-03-03: qty 2

## 2021-03-03 MED ORDER — DIPHENHYDRAMINE HCL 50 MG/ML IJ SOLN: 12.5000 mg | Freq: Four times a day (QID) | INTRAMUSCULAR | Status: DC | PRN

## 2021-03-03 MED ORDER — ACETAMINOPHEN 500 MG PO TABS
1000.0000 mg | ORAL_TABLET | Freq: Once | ORAL | Status: AC
  Administered 2021-03-03: 1000 mg via ORAL

## 2021-03-03 MED ORDER — OXYBUTYNIN CHLORIDE 5 MG PO TABS: 5.0000 mg | ORAL_TABLET | Freq: Three times a day (TID) | ORAL | Status: DC | PRN

## 2021-03-03 MED ORDER — DORZOLAMIDE HCL-TIMOLOL MAL 2-0.5 % OP SOLN
1.0000 [drp] | Freq: Two times a day (BID) | OPHTHALMIC | Status: DC
  Administered 2021-03-03: 1 [drp] via OPHTHALMIC
  Filled 2021-03-03: qty 10

## 2021-03-03 MED ORDER — LIDOCAINE 2% (20 MG/ML) 5 ML SYRINGE
INTRAMUSCULAR | Status: AC
  Filled 2021-03-03: qty 5

## 2021-03-03 MED ORDER — DOCUSATE SODIUM 100 MG PO CAPS
ORAL_CAPSULE | ORAL | Status: AC
  Filled 2021-03-03: qty 1

## 2021-03-03 MED ORDER — ACETAMINOPHEN 500 MG PO TABS
ORAL_TABLET | ORAL | Status: AC
  Filled 2021-03-03: qty 2

## 2021-03-03 MED ORDER — DOCUSATE SODIUM 100 MG PO CAPS
100.0000 mg | ORAL_CAPSULE | Freq: Two times a day (BID) | ORAL | Status: DC
  Administered 2021-03-03: 100 mg via ORAL

## 2021-03-03 MED ORDER — ONDANSETRON HCL 4 MG/2ML IJ SOLN
INTRAMUSCULAR | Status: AC
  Filled 2021-03-03: qty 2

## 2021-03-03 MED ORDER — INSULIN ASPART 100 UNIT/ML IJ SOLN
0.0000 [IU] | Freq: Every day | INTRAMUSCULAR | Status: DC
  Administered 2021-03-03: 3 [IU] via SUBCUTANEOUS

## 2021-03-03 MED ORDER — MIDAZOLAM HCL 2 MG/2ML IJ SOLN
INTRAMUSCULAR | Status: DC | PRN
  Administered 2021-03-03: 2 mg via INTRAVENOUS

## 2021-03-03 MED ORDER — ONDANSETRON HCL 4 MG/2ML IJ SOLN: 4.0000 mg | INTRAMUSCULAR | Status: DC | PRN

## 2021-03-03 SURGICAL SUPPLY — 60 items
ADH SKN CLS APL DERMABOND .7 (GAUZE/BANDAGES/DRESSINGS) ×1
APL PRP STRL LF DISP 70% ISPRP (MISCELLANEOUS) ×1
BAG DECANTER FOR FLEXI CONT (MISCELLANEOUS) ×3 IMPLANT
BAG DRN RND TRDRP ANRFLXCHMBR (UROLOGICAL SUPPLIES) ×1
BAG URINE DRAIN 2000ML AR STRL (UROLOGICAL SUPPLIES) ×2 IMPLANT
BLADE SURG 15 STRL LF DISP TIS (BLADE) ×1 IMPLANT
BLADE SURG 15 STRL SS (BLADE) ×2
BNDG COHESIVE 2X5 TAN ST LF (GAUZE/BANDAGES/DRESSINGS) IMPLANT
BNDG GAUZE ELAST 4 BULKY (GAUZE/BANDAGES/DRESSINGS) ×2 IMPLANT
CATH FOLEY 2WAY SLVR  5CC 16FR (CATHETERS) ×1
CATH FOLEY 2WAY SLVR 5CC 16FR (CATHETERS) ×1 IMPLANT
CHLORAPREP W/TINT 26 (MISCELLANEOUS) ×2 IMPLANT
COVER BACK TABLE 60X90IN (DRAPES) ×2 IMPLANT
COVER MAYO STAND STRL (DRAPES) ×4 IMPLANT
DERMABOND ADVANCED (GAUZE/BANDAGES/DRESSINGS) ×1
DERMABOND ADVANCED .7 DNX12 (GAUZE/BANDAGES/DRESSINGS) ×1 IMPLANT
DRAIN CHANNEL 10F 3/8 F FF (DRAIN) ×3 IMPLANT
DRAPE INCISE IOBAN 66X45 STRL (DRAPES) ×2 IMPLANT
DRAPE LAPAROTOMY 100X72 PEDS (DRAPES) ×2 IMPLANT
EVACUATOR DRAINAGE 7X20 100CC (MISCELLANEOUS) ×1 IMPLANT
EVACUATOR SILICONE 100CC (DRAIN) ×5 IMPLANT
EVACUATOR SILICONE 100CC (MISCELLANEOUS) ×2
GAUZE 4X4 16PLY ~~LOC~~+RFID DBL (SPONGE) ×2 IMPLANT
GAUZE SPONGE 4X4 12PLY STRL (GAUZE/BANDAGES/DRESSINGS) ×2 IMPLANT
GAUZE SPONGE 4X4 12PLY STRL LF (GAUZE/BANDAGES/DRESSINGS) ×1 IMPLANT
GLOVE SURG ENC MOIS LTX SZ7.5 (GLOVE) ×6 IMPLANT
GOWN STRL REUS W/TWL XL LVL3 (GOWN DISPOSABLE) ×2 IMPLANT
HOLDER FOLEY CATH W/STRAP (MISCELLANEOUS) ×2 IMPLANT
HOOD PEEL AWAY FLYTE STAYCOOL (MISCELLANEOUS) ×2 IMPLANT
IV NS 250ML (IV SOLUTION) ×2
IV NS 250ML BAXH (IV SOLUTION) IMPLANT
KIT TITAN ASSEMBLY (Erectile Restoration) ×1 IMPLANT
KIT TITAN ASSEMBLY STANDARD (Erectile Restoration) ×1 IMPLANT
KIT TITAN ASSEMBLY STD (Erectile Restoration) IMPLANT
KIT TURNOVER CYSTO (KITS) ×2 IMPLANT
NEEDLE HYPO 22GX1.5 SAFETY (NEEDLE) ×1 IMPLANT
NS IRRIG 1000ML POUR BTL (IV SOLUTION) ×2 IMPLANT
PACK BASIN DAY SURGERY FS (CUSTOM PROCEDURE TRAY) ×2 IMPLANT
PENCIL SMOKE EVACUATOR (MISCELLANEOUS) ×2 IMPLANT
PLUG CATH AND CAP STER (CATHETERS) ×2 IMPLANT
PROS TITAN SCROT 0 ANG 22CM (Erectile Restoration) ×2 IMPLANT
PROSTHESIS TTN SCRO 0 ANG 22CM (Erectile Restoration) IMPLANT
RESERVOIR TITAN 12.5CC W/VLV ×1 IMPLANT
RETRACTOR WILSON COLOPLAST (INSTRUMENTS) ×1 IMPLANT
RETRACTOR WILSON SYSTEM (INSTRUMENTS) IMPLANT
SUPPORT SCROTAL LG STRP (MISCELLANEOUS) ×2 IMPLANT
SUT MNCRL AB 3-0 PS2 18 (SUTURE) ×2 IMPLANT
SUT VIC AB 2-0 SH 27 (SUTURE) ×10
SUT VIC AB 2-0 SH 27XBRD (SUTURE) ×5 IMPLANT
SUT VIC AB 2-0 UR6 27 (SUTURE) ×4 IMPLANT
SUT VIC AB 4-0 PS2 18 (SUTURE) IMPLANT
SYR 10ML LL (SYRINGE) ×4 IMPLANT
SYR 50ML LL SCALE MARK (SYRINGE) ×4 IMPLANT
SYR BULB IRRIG 60ML STRL (SYRINGE) ×2 IMPLANT
SYR CONTROL 10ML LL (SYRINGE) IMPLANT
TOWEL OR 17X26 10 PK STRL BLUE (TOWEL DISPOSABLE) ×2 IMPLANT
TRAY DSU PREP LF (CUSTOM PROCEDURE TRAY) ×2 IMPLANT
TUBE CONNECTING 12X1/4 (SUCTIONS) ×2 IMPLANT
WATER STERILE IRR 1000ML POUR (IV SOLUTION) ×2 IMPLANT
YANKAUER SUCT BULB TIP NO VENT (SUCTIONS) ×4 IMPLANT

## 2021-03-03 NOTE — Transfer of Care (Signed)
Immediate Anesthesia Transfer of Care Note  Patient: Curtis Watkins  Procedure(s) Performed: PENILE PROTHESIS INFLATABLE COLOPLAST (Penis)  Patient Location: PACU  Anesthesia Type:General  Level of Consciousness: drowsy and patient cooperative  Airway & Oxygen Therapy: Patient Spontanous Breathing and Patient connected to face mask oxygen  Post-op Assessment: Report given to RN and Post -op Vital signs reviewed and stable  Post vital signs: Reviewed and stable  Last Vitals:  Vitals Value Taken Time  BP 117/76 03/03/21 1300  Temp    Pulse 72 03/03/21 1303  Resp 12 03/03/21 1303  SpO2 100 % 03/03/21 1303  Vitals shown include unvalidated device data.  Last Pain:  Vitals:   03/03/21 0845  TempSrc: Oral  PainSc: 0-No pain      Patients Stated Pain Goal: 7 (03/03/21 0845)  Complications: No notable events documented.

## 2021-03-03 NOTE — H&P (Signed)
CC/HPI: CC: Erectile dysfunction  HPI:  09/26/2020  66 year old male with history of erectile dysfunction for several years. He has failed oral medication as well as intracorporeal injection. He is most interested in inflatable penile prosthesis. He is on aspirin and Plavix for history of cardiac stent. He also has a history of diabetes. Last A1c was 8.1 on 08/02/2020. He states he has been keeping his glucose under 200. He denies any voiding complaints. No recent PSA.   01/08/2021  Patient underwent CT IVP that revealed no evidence of genitourinary malignancy. Has persistent microscopic hematuria today. PSA at the last visit was 0.59. He has no complaints today. Presents today for cystoscopy. Is eager to proceed with inflatable penile prosthesis placement. Hemoglobin A1c was 6.9 at his last visit.   03/03/2021 Patient was cleared by cardiology to hold plavix and continue ASA 81. He presents for IPP.    ALLERGIES: None   MEDICATIONS: Aspirin 81 mg tablet,chewable  Metoprolol Succinate 25 mg tablet, extended release 24 hr  Atorvastatin Calcium 80 mg tablet  Brimonidine Tartrate 0.2 % drops  Clopidogrel 75 mg tablet  Furosemide 20 mg tablet  Jardiance  Losartan Potassium 25 mg tablet  Spironolactone 25 mg tablet     GU PSH: Locm 300-399Mg /Ml Iodine,1Ml - 11/25/2020     NON-GU PSH: Cardiac Stent Placement - 03/29/2020     GU PMH: Microscopic hematuria - 11/25/2020, - 09/26/2020 BPH w/o LUTS - 09/26/2020 ED due to arterial insufficiency - 09/26/2020 Encounter for Prostate Cancer screening - 09/26/2020    NON-GU PMH: Diabetes Type 2    FAMILY HISTORY: Diabetes - Mother   SOCIAL HISTORY: Marital Status: Married Preferred Language: English; Race: Black or African American Current Smoking Status: Patient smokes occasionally.   Tobacco Use Assessment Completed: Used Tobacco in last 30 days? Drinks 2 caffeinated drinks per day. Patient's occupation is/was retired.     Notes: 1 daughter    REVIEW OF SYSTEMS:    GU Review Male:   Patient reports get up at night to urinate. Patient denies frequent urination, hard to postpone urination, burning/ pain with urination, leakage of urine, stream starts and stops, trouble starting your stream, have to strain to urinate , erection problems, and penile pain.  Gastrointestinal (Upper):   Patient denies nausea, vomiting, and indigestion/ heartburn.  Gastrointestinal (Lower):   Patient denies diarrhea and constipation.  Constitutional:   Patient denies fever, night sweats, weight loss, and fatigue.  Skin:   Patient denies skin rash/ lesion and itching.  Eyes:   Patient denies blurred vision and double vision.  Ears/ Nose/ Throat:   Patient denies sore throat and sinus problems.  Hematologic/Lymphatic:   Patient denies swollen glands and easy bruising.  Cardiovascular:   Patient denies leg swelling and chest pains.  Respiratory:   Patient denies cough and shortness of breath.  Endocrine:   Patient denies excessive thirst.  Musculoskeletal:   Patient denies back pain and joint pain.  Neurological:   Patient denies headaches and dizziness.  Psychologic:   Patient denies depression and anxiety.   VITAL SIGNS: None   GU PHYSICAL EXAMINATION:    Testes: No tenderness, no swelling, no enlargement left testes. No tenderness, no swelling, no enlargement right testes. Normal location left testes. Normal location right testes. No mass, no cyst, no varicocele, no hydrocele left testes. No mass, no cyst, no varicocele, no hydrocele right testes.  Penis: Penis uncircumcised. No foreskin warts, no cracks. No dorsal peyronie's plaques, no left corporal peyronie's plaques, no  right corporal peyronie's plaques, no scarring, no shaft warts. No balanitis, no meatal stenosis.    MULTI-SYSTEM PHYSICAL EXAMINATION:       Complexity of Data:  Source Of History:  Patient  Lab Test Review:   PSA  Records Review:   Previous Doctor Records, Previous Patient  Records  Urine Test Review:   Urinalysis  X-Ray Review: C.T. Hematuria: Reviewed Films. Reviewed Report. Discussed With Patient.     ASSESSMENT:      ICD-10 Details  1 GU:   BPH w/o LUTS - N40.0 Chronic, Stable  2   ED due to arterial insufficiency - N52.01 Chronic, Stable  3   Microscopic hematuria - R31.21 Chronic, Stable   PLAN:           Document Letter(s):  Created for Patient: Clinical Summary         Notes:   I have discussed with the patient the fact that the CT scan has revealed no abnormality of the upper tract that could contribute to the presence of microscopic hematuria and cystoscopically no abnormality of the lower tract was identified.   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 I did not feel like a catheter was necessary. I dilated enough to insert the scope into the bladder and there were no tumors.   The patient has elected to undergo placement of a three-piece inflatable penile prosthesis. He understands that this is an invasive surgical option for erectile dysfunction and will result in permanent inability to achieve natural erections. He understands the potential complications of the surgery including bleeding, pain, ST deformity, injury to surrounding structures such as urethral injury, erosion of the device, as well as the potentially devastating complication of infection that would require complete removal of the entire prosthesis. He also understands that the typical lifespan of a prosthetic is 5-10 years (but may last shorter or longer) and if it becomes nonfunctional then it will require replacement. He understands that the length of his erection will be approximately equal to stretched penile length and that the most common complaint among men after this surgery is the appearance of a smaller erection. We discussed that there is an overall high satisfaction rate upwards of 80-90% among men who undergo the surgery. He is eager to proceed.

## 2021-03-03 NOTE — Discharge Instructions (Addendum)
Discharge instructions following scrotal surgery  Call your doctor for: Fever is greater than 100.5 Severe nausea or vomiting Increasing pain not controlled by pain medication Increasing redness or drainage from incisions  The number for questions or concerns is [phone]  Activity level: No lifting greater than 20 pounds (about equal to milk) for the next 2 weeks or until cleared to do so at follow-up appointment.  Otherwise activity as tolerated by comfort level.  Diet: May resume your regular diet as tolerated  Driving: No driving while still taking opiate pain medications (weight at least 6-8 hours after last dose).  No driving if you still sore from surgery as it may limit her ability to react quickly if necessary.   Shower/bath: May shower and get incision wet pad dry immediately following.  Do not scrub vigorously for the next 2-3 weeks.  Do not soak incision (ID soaking in bath or swimming) until told he may do so by Dr., as this may promote a wound infection.  Wound care: He may cover wounds with sterile gauze as needed to prevent incisions rubbing on close follow-up in any seepage.  Where tight fitting underpants/scrotal support for at least 2 weeks.  He should apply cold compresses (ice or sac of frozen peas/corn) to your scrotum for at least 48 hours to reduce the swelling for 15 minutes at a time indirectly.  You should expect that his scrotum will swell up initially and then get smaller over the next 2-4 weeks.  Follow-up appointments: Follow-up appointment will be scheduled with Dr. Bell for a wound check.  

## 2021-03-03 NOTE — Op Note (Signed)
Operative Note  Preoperative diagnosis:  1.  Erectile dysfunction  Postoperative diagnosis: 1.  Erectile dysfunction  Procedure(s): 1.  Insertion of Coloplast inflatable penile prosthesis  Surgeon: Modena Slater, MD  Assistants:   Anesthesia: General  Complications: None immediate  EBL: 50 cc  Specimens: 1.  None  Drains/Catheters: 1.  Foley catheter 2.  JP drain  Intraoperative findings: Corporal length was the following: Right proximal 9 cm, right distal 13cm, left proximal 9 cm, left distal 13 cm.  Therefore a 22 cm with no rear tip extender was placed.  The reservoir was placed in the right retropubic space.  Indication: 66 year old male with erectile dysfunction refractory to medical management presents for inflatable penile prosthesis placement.  Description of procedure:  The patient was identified and consent was obtained.  The patient was taken to the operating room and placed in the supine position.  The patient was placed under general anesthesia.  Perioperative antibiotics were administered. Patient was prepped and draped in a standard sterile fashion and a timeout was performed.  A Foley catheter was placed.  A 4 cm transverse penoscrotal incision was made with Bovie electrocautery on cut setting.  Blunt dissection clear tissue away from the corpora bilaterally.  Sharp dissection was used to carefully take down the septum taking great care to avoid the urethra.  Ring retractor was used to assist with retraction.  Additional sharp dissection was performed bilaterally to expose the corpora.  Spot electrocautery was used for hemostasis.  2 separate 2-0 Vicryl sutures were placed on each side in the proximal corpora.  An incision was made in between each of these bilaterally to make our corporotomy.  First Metzenbaum scissors were used to carefully dilate proximally and distally on each side.  Brooks dilators were then used from a size of 11-12 to carefully dilate proximally  and distally.  There was minimal resistance with dilation.  We then proceeded with measurement with the measurements noted above.  We irrigated the corpora with antibiotic solution and there was no evidence of any urethral injury.  We inserted the [facility] dilators proximally to confirm there was no crossover and also did this distally as well.  There was no evidence of any crossover.  We thoroughly irrigated the wound.  We bluntly developed the space for a reservoir piercing the transversalis fascia on the right bluntly and gained access to the retropubic space. At this point in time all members of the surgical team changed their over gloves.  Sterile towel was placed inferior to the scrotum.  The device was prepared.  The sizing of the device is noted above.  First, the reservoir was placed in the right retropubic space and 95cc of normal saline filled the reservoir.  We then used the furlough device to pierce the distal glands with the [facility] needle.  On each side we first inserted the proximal portion of the cylinder into the proximal corpora followed by situation of the distal portion and the distal corpora and it sat nicely within the corpora.  We then test inflated the device and noted it to be in excellent position and it was even on each side.  There was a slight ST deformity.  There was no significant curvature.  We then secured down the stay sutures to close the corporotomy.  To further close the corporotomy, additional 2-0 Vicryl sutures were carefully placed and secured down to close the corporotomy.  Once bilateral corporotomies were closed, excess tubing was trimmed and properly connected together.  Copious antibiotic solution was used to irrigate the area.  A dartos pouch on the anterior scrotum was then created and the pump was placed within this.  The dartos tissue was closed with a pursestring stitch to keep the pump in place.  A JP drain was placed with the tubing coming out the left side.  This was  secured down with a drain stitch.  The dartos tissue was closed with running 2-0 Vicryl to cover the tubing.  A second layer of dartos was then closed with 2-0 Vicryl in a running fashion to close the incision.  The skin was then closed with running 4-0 Monocryl and Dermabond.  The prosthetic was inflated about 70%.  The scrotum and penis were then wrapped with a mummy wrap dressing.  This concluded the operation.  The patient tolerated the procedure well and was stable postoperatively.  Plan: The patient will be monitored overnight.  His Foley catheter will be removed tomorrow and the prosthesis will be deflated.  After the drain is removed he will be discharged with 7 days of p.o. antibiotic.  He will return in 6 weeks for a postoperative check and to be taught how to use the device.

## 2021-03-03 NOTE — Anesthesia Preprocedure Evaluation (Addendum)
Anesthesia Evaluation  Patient identified by MRN, date of birth, ID band Patient awake    Reviewed: Allergy & Precautions, NPO status , Patient's Chart, lab work & pertinent test results, reviewed documented beta blocker date and time   Airway Mallampati: II  TM Distance: >3 FB Neck ROM: Full    Dental  (+) Edentulous Upper, Dental Advisory Given   Pulmonary Current Smoker, former smoker,  Post-CABG CXR showed moderate pleural effusions, persistent but increased in size since 04/06/20. Note is not yet completed, but appears he is scheduled for thoracentesis with IR on 05/03/20. O2 sat 93%.   Pulmonary exam normal breath sounds clear to auscultation       Cardiovascular hypertension, Pt. on medications and Pt. on home beta blockers + angina + CAD, + CABG (CABG x 4 04/03/20), + Peripheral Vascular Disease and +CHF (LVEF nml post-CABG (40-45% pre CABG), grade 2 diastolic dysfunction)  Normal cardiovascular exam+ Valvular Problems/Murmurs (mild MR) MR  Rhythm:Regular Rate:Normal  Echo 07/2020 1. Left ventricular ejection fraction, by estimation, is 45%. The left ventricle has mildly decreased function. The left ventricle demonstrates global hypokinesis. Left ventricular diastolic function could not be evaluated.  2. The mitral valve is grossly normal. Mild mitral valve regurgitation. No evidence of mitral stenosis.  3. The aortic valve is tricuspid. Aortic valve regurgitation is not visualized.  4. There is normal pulmonary artery systolic pressure.   Limited echo 05/2020 1. Left ventricular ejection fraction, by estimation, is 25 to 30%. The left ventricle has severely decreased function. The left ventricle demonstrates global hypokinesis. The left ventricular internal cavity size was mildly dilated.  2. A small pericardial effusion is present. The pericardial effusion is localized near the right atrium. There is no evidence of cardiac  tamponade.     Echo 03/28/20 (pre-CABG): 1. Left ventricular ejection fraction, by estimation, is 40 to 45%. The left ventricle has mildly decreased function. There is mild left ventricular hypertrophy. Left ventricular diastolic parameters are consistent with Grade II diastolic dysfunction (pseudonormalization). Elevated left atrial pressure.  2. Right ventricular systolic function is normal. The right ventricular size is normal.  3. Left atrial size was mildly dilated.  4. Mild mitral valve regurgitation.  5. The inferior vena cava is dilated in size with <50% respiratory variability, suggesting right atrial pressure of 15 mmHg.   Post-CABG TEE: POST-OP IMPRESSIONS  - Left Ventricle: The cavity size was normal. The wall motion is normal.  - Aorta: The aorta appears unchanged from pre-bypass.  - Left Atrial Appendage: The left atrial appendage appears unchanged from pre-bypass.  - Aortic Valve: The aortic valve appears unchanged from pre-bypass.  - Mitral Valve: There is mild regurgitation.  - Tricuspid Valve: The tricuspid valve appears unchanged from pre-bypass.  - Interatrial Septum: The interatrial septum appears unchanged from pre-bypass.    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   Abdominal   Peds  Hematology negative hematology ROS (+)   Anesthesia Other Findings   Reproductive/Obstetrics negative OB ROS                            Anesthesia Physical  Anesthesia Plan  ASA: 3  Anesthesia Plan: General   Post-op Pain  Management: Minimal or no pain anticipated and Tylenol PO (pre-op)   Induction: Intravenous  PONV Risk Score and Plan: 3 and Midazolam, Dexamethasone, Ondansetron and Treatment  may vary due to age or medical condition  Airway Management Planned: Oral ETT and LMA  Additional Equipment: None  Intra-op Plan:   Post-operative Plan: Extubation in OR  Informed Consent: I have reviewed the patients History and Physical, chart, labs and discussed the procedure including the risks, benefits and alternatives for the proposed anesthesia with the patient or authorized representative who has indicated his/her understanding and acceptance.     Dental advisory given  Plan Discussed with: CRNA  Anesthesia Plan Comments:        Anesthesia Quick Evaluation

## 2021-03-03 NOTE — Anesthesia Postprocedure Evaluation (Signed)
Anesthesia Post Note  Patient: Curtis Watkins  Procedure(s) Performed: PENILE PROTHESIS INFLATABLE COLOPLAST (Penis)     Patient location during evaluation: PACU Anesthesia Type: General Level of consciousness: sedated and patient cooperative Pain management: pain level controlled Vital Signs Assessment: post-procedure vital signs reviewed and stable Respiratory status: spontaneous breathing Cardiovascular status: stable Anesthetic complications: no   No notable events documented.  Last Vitals:  Vitals:   03/03/21 1330 03/03/21 1345  BP: 112/72 109/85  Pulse: 73 79  Resp: 10 18  Temp: 36.6 C   SpO2: 100% 100%    Last Pain:  Vitals:   03/03/21 1345  TempSrc:   PainSc: 0-No pain                 Lewie Loron

## 2021-03-03 NOTE — Anesthesia Procedure Notes (Signed)
Procedure Name: LMA Insertion Date/Time: 03/03/2021 11:00 AM Performed by: Pearson Grippe, CRNA Pre-anesthesia Checklist: Patient identified, Emergency Drugs available, Suction available and Patient being monitored Patient Re-evaluated:Patient Re-evaluated prior to induction Oxygen Delivery Method: Circle system utilized Preoxygenation: Pre-oxygenation with 100% oxygen Induction Type: IV induction Ventilation: Mask ventilation without difficulty LMA: LMA with gastric port inserted LMA Size: 4.0 Number of attempts: 1 Airway Equipment and Method: Bite block Placement Confirmation: positive ETCO2 Tube secured with: Tape Dental Injury: Teeth and Oropharynx as per pre-operative assessment

## 2021-03-04 ENCOUNTER — Other Ambulatory Visit (HOSPITAL_COMMUNITY): Payer: Self-pay

## 2021-03-04 ENCOUNTER — Encounter (HOSPITAL_BASED_OUTPATIENT_CLINIC_OR_DEPARTMENT_OTHER): Payer: Self-pay | Admitting: Urology

## 2021-03-04 ENCOUNTER — Other Ambulatory Visit: Payer: Self-pay

## 2021-03-04 DIAGNOSIS — N5201 Erectile dysfunction due to arterial insufficiency: Secondary | ICD-10-CM | POA: Diagnosis not present

## 2021-03-04 LAB — GLUCOSE, CAPILLARY: Glucose-Capillary: 156 mg/dL — ABNORMAL HIGH (ref 70–99)

## 2021-03-04 MED ORDER — INSULIN ASPART 100 UNIT/ML IJ SOLN
INTRAMUSCULAR | Status: AC
  Filled 2021-03-04: qty 1

## 2021-03-04 MED ORDER — CIPROFLOXACIN HCL 500 MG PO TABS
500.0000 mg | ORAL_TABLET | Freq: Two times a day (BID) | ORAL | 0 refills | Status: AC
  Filled 2021-03-04 (×3): qty 14, 7d supply, fill #0

## 2021-03-04 MED ORDER — HYDROCODONE-ACETAMINOPHEN 5-325 MG PO TABS
1.0000 | ORAL_TABLET | ORAL | 0 refills | Status: DC | PRN
  Filled 2021-03-04: qty 20, 4d supply, fill #0

## 2021-03-07 NOTE — Discharge Summary (Signed)
Physician Discharge Summary  Patient ID: Curtis Watkins MRN: [id number] DOB/AGE: 1955/06/10 66 y.o.  Admit date: 03/03/2021 Discharge date: 03/04/2021  Admission Diagnoses:  Discharge Diagnoses:  Principal Problem:   Erectile dysfunction   Discharged Condition: good  Hospital Course: Patient underwent placement of inflatable penile prosthesis and remained overnight on observation.  He continued to have some output from the JP drain in the scrotum and therefore I decided to keep the JP drain but remove the Foley catheter after deflating the penile prosthesis.  JP drain remained with plans for follow-up this week for likely removal.  Consults: None  Significant Diagnostic Studies: None  Treatments: surgery: As above  Discharge Exam: Blood pressure 124/73, pulse 80, temperature 98.2 F (36.8 C), resp. rate 15, height _0  (1.803 m), weight 77.4 kg, SpO2 100 %. General appearance: alert no acute distress Uncircumcised phallus.  Foley catheter removed.  Scrotal pump in good position and prosthetic was deflated.  Disposition: Discharge disposition: 01-Home or Self Care       Discharge Instructions     No wound care   Complete by: As directed       Allergies as of 03/04/2021   No Known Allergies      Medication List     STOP taking these medications    clopidogrel 75 MG tablet Commonly known as: Plavix       TAKE these medications    aspirin EC 81 MG tablet Take 1 tablet (81 mg total) by mouth daily.   atorvastatin 80 MG tablet Commonly known as: LIPITOR Take 1 tablet (80 mg total) by mouth daily.   blood glucose meter kit and supplies by Other route as directed. Dispense based on patient and insurance preference. Use up to four times daily as directed. (FOR ICD-10 E10.9, E11.9).   brimonidine 0.2 % ophthalmic solution Commonly known as: ALPHAGAN Place 1 drop into the right eye as needed.   ciprofloxacin 500 MG tablet Commonly known as: Cipro Take 1  tablet by mouth 2  times daily for 7 days.   dorzolamide-timolol 22.3-6.8 MG/ML ophthalmic solution Commonly known as: COSOPT Place 1 drop into the left eye 2 times daily.   empagliflozin 25 MG Tabs tablet Commonly known as: Jardiance Take 1 tablet (25 mg total) by mouth daily before breakfast.   furosemide 20 MG tablet Commonly known as: LASIX Take 1 tablet (20 mg total) by mouth daily.   HYDROcodone-acetaminophen 5-325 MG tablet Commonly known as: NORCO/VICODIN Take 1 tablet by mouth every 4 hours as needed for moderate pain.   losartan 25 MG tablet Commonly known as: COZAAR Take 0.5 tablets (12.5 mg total) by mouth daily.   metoprolol succinate 25 MG 24 hr tablet Commonly known as: Toprol XL Take 1.5 tablets (37.5 mg total) by mouth daily.   OVER THE COUNTER MEDICATION Apply 1 application topically daily. Diabetic lotion   Ozempic (0.25 or 0.5 MG/DOSE) 2 MG/1.5ML Sopn Generic drug: Semaglutide(0.25 or 0.5MG/DOS) Inject 0.5 mg into the skin once a week. What changed: additional instructions   spironolactone 25 MG tablet Commonly known as: ALDACTONE Take 1/2 tablet (12.5 mg total) by mouth daily.         Signed: Marton Redwood, III 03/07/2021, 1:46 PM

## 2021-03-10 ENCOUNTER — Other Ambulatory Visit: Payer: Self-pay

## 2021-03-11 ENCOUNTER — Encounter: Payer: Self-pay | Admitting: Family

## 2021-03-11 ENCOUNTER — Ambulatory Visit (INDEPENDENT_AMBULATORY_CARE_PROVIDER_SITE_OTHER): Payer: Medicare Other | Admitting: Family

## 2021-03-11 DIAGNOSIS — S98131A Complete traumatic amputation of one right lesser toe, initial encounter: Secondary | ICD-10-CM

## 2021-03-11 DIAGNOSIS — L97521 Non-pressure chronic ulcer of other part of left foot limited to breakdown of skin: Secondary | ICD-10-CM

## 2021-03-11 DIAGNOSIS — L97511 Non-pressure chronic ulcer of other part of right foot limited to breakdown of skin: Secondary | ICD-10-CM

## 2021-03-11 DIAGNOSIS — B351 Tinea unguium: Secondary | ICD-10-CM | POA: Diagnosis not present

## 2021-03-11 NOTE — Progress Notes (Signed)
Office Visit Note   Patient: Curtis Watkins           Date of Birth: Dec 07, 1955           MRN: [id number] Visit Date: 03/11/2021              Requested by: Rema Fendt, NP [address] [place],  [place] [address] PCP: Rema Fendt, NP  Chief Complaint  Patient presents with   Left Foot - Follow-up   Right Foot - Follow-up      HPI: The 66 year old gentleman who comes in today in routine follow-up.  Bilateral foot evaluation with calluses ulcers and onychomycosis.    He has no concerns  Assessment & Plan: Visit Diagnoses: No diagnosis found.  Plan: Nails trimmed x9 without incident.  Discussed heel cord stretching to offload the forefeet bilaterally.  The patient will return in 3 months for nail trim sooner should he have any concerns  Follow-Up Instructions: No follow-ups on file.   Ortho Exam  Patient is alert, oriented, no adenopathy, well-dressed, normal affect, normal respiratory effort.  On examination bilateral feet there is no edema.  There is thickened hyperkeratotic tissue beneath the first metatarsal heads bilaterally this was trimmed with a 10 blade knife back to viable tissue.  Beneath the left first metatarsal head there is an ulcer this is 8 mm in diameter and 5 mm deep there is no exposed bone or tendon.  This does not probe there is no drainage no surrounding erythema he has thickened and discolored onychomycotic nails x9 these were trimmed today.  Patient is unable to safely trim his own nails.   Imaging: No results found. No images are attached to the encounter.  Labs: Lab Results  Component Value Date   HGBA1C 6.7 (A) 02/07/2021   HGBA1C 6.7 (A) 11/01/2020   HGBA1C 8.1 (H) 08/02/2020     Lab Results  Component Value Date   ALBUMIN 4.0 08/02/2020    Lab Results  Component Value Date   MG 2.6 (H) 04/04/2020   MG 2.6 (H) 04/04/2020   MG 2.9 (H) 04/03/2020   No results found for: VD25OH  No results found for:  PREALBUMIN CBC EXTENDED Latest Ref Rng & Units 03/03/2021 08/02/2020 05/24/2020  WBC 3.4 - 10.8 x10E3/uL - 8.0 7.8  RBC 4.14 - 5.80 x10E6/uL - 3.92(L) 3.02(L)  HGB 13.0 - 17.0 g/dL 12.2(L) 11.4(L) 8.7(L)  HCT 39.0 - 52.0 % 36.0(L) 34.9(L) 27.8(L)  PLT 150 - 450 x10E3/uL - 237 408(H)     There is no height or weight on file to calculate BMI.  Orders:  No orders of the defined types were placed in this encounter.  No orders of the defined types were placed in this encounter.    Procedures: No procedures performed  Clinical Data: No additional findings.  ROS:  All other systems negative, except as noted in the HPI. Review of Systems  Constitutional: Negative.   Skin:  Positive for wound. Negative for color change.   Objective: Vital Signs: There were no vitals taken for this visit.  Specialty Comments:  No specialty comments available.  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   Coronary artery disease involving native coronary artery of native heart with unstable angina pectoris (HCC)    Acute congestive heart failure (HCC) 03/27/2020   Diabetic foot ulcers (HCC) 03/27/2020   DM2 (diabetes mellitus, type 2) (HCC) 03/27/2020   HTN (hypertension) 03/27/2020   Past Medical History:  Diagnosis Date   Blind right eye    Carotid artery disease (HCC)    04/01/20: 60-79% RICA stenosis, 1-39% LICA stenosis   Chronic kidney disease    ckd stage 3 a per dr Ronald Lobo 02-05-2021 epic   Coronary artery disease    NSTEMI s/p CABG in 03/2020 (LIMA-->LAD/diag, L radial-->OM1/OM3); b.   DM type 2    Glaucoma of right eye, unspecified glaucoma type    HFrEF (heart failure with  reduced ejection fraction) (HCC)    Echo 6/22: EF 45, global HK, mild MR, RVSP 31.9   Hypertension    Ischemic cardiomyopathy    PAD (peripheral artery disease) (HCC)    occluded right axillary artery 03/29/20     Family History  Problem Relation Age of Onset   Diabetes Mother     Past Surgical History:  Procedure Laterality Date   AMPUTATION Right 04/24/2020   Procedure: RIGHT 2ND AND 3RD TOE AMPUTATION;  Surgeon: Nadara Mustard, MD;  Location: [facility];  Service: Orthopedics;  Laterality: Right;   CENTRAL VENOUS CATHETER INSERTION Left 04/03/2020   Procedure: INSERTION OF FEMORAL ARTERIAL LINE;  Surgeon: Linden Dolin, MD;  Location: [facility];  Service: Open Heart Surgery;  Laterality: Left;   CORONARY ARTERY BYPASS GRAFT N/A 04/03/2020   Procedure: CORONARY ARTERY BYPASS GRAFTING (CABG) TIMES FOUR USING LEFT INTERNAL MAMMARY ARTERY AND LEFT RADIAL ARTERY.;  Surgeon: Linden Dolin, MD;  Location[facility];  Service: Open Heart Surgery;  Laterality: N/A;   CORONARY PRESSURE WIRE/FFR WITH 3D MAPPING N/A 03/29/2020   Procedure: Coronary Pressure Wire/FFR w/3D Mapping;  Surgeon: Lyn Records, MD;  [facility];  Service: Cardiovascular;  Laterality: N/A;   EYE SURGERY Right 01/21/2021   right eye cataract lens replacement surgery   IR THORACENTESIS ASP PLEURAL SPACE W/IMG GUIDE  05/03/2020   IR THORACENTESIS ASP PLEURAL SPACE W/IMG GUIDE  05/16/2020   LEFT HEART CATH AND CORONARY ANGIOGRAPHY N/A 03/29/2020   Procedure: LEFT HEART CATH AND CORONARY ANGIOGRAPHY;  Surgeon: Lyn Records, MD;  [facility];  Service: Cardiovascular;  Laterality: N/A;   PENILE PROSTHESIS IMPLANT N/A 03/03/2021   Procedure: PENILE PROTHESIS INFLATABLE COLOPLAST;  Surgeon: Crista Elliot, MD;  Location: [facility];  Service: Urology;  Laterality: N/A;  REQUESTING 2 HRS   RADIAL ARTERY HARVEST Left 04/03/2020   Procedure: LEFT RADIAL ARTERY HARVEST;  Surgeon:  Linden Dolin, MD;  Location: [facility];  Service: Open Heart Surgery;  Laterality: Left;   TEE WITHOUT CARDIOVERSION N/A 04/03/2020   Procedure: TRANSESOPHAGEAL ECHOCARDIOGRAM (TEE);  Surgeon: Linden Dolin, MD;  [facility]: [facility];  Service: Open Heart Surgery;  Laterality: N/A;   UPPER EXTREMITY ANGIOGRAPHY  03/29/2020   Procedure: Upper Extremity Angiography;  Surgeon: Lyn Records, MD;  Location: [facility] INVASIVE CV LAB;  Service: Cardiovascular;;  rt.  arm   Social History   Occupational History   Occupation: retired d/t medical conditions  Tobacco Use   Smoking status: Former    Packs/day: 0.25    Years: 2.00    Pack years: 0.50  Types: Cigarettes    Quit date: 08/23/2020    Years since quitting: 0.5   Smokeless tobacco: Never  Vaping Use   Vaping Use: Never used  Substance and Sexual Activity   Alcohol use: Not Currently    Alcohol/week: 1.0 standard drink    Types: 1 Cans of beer per week    Comment: occassionally beer   Drug use: Not Currently    Types: Marijuana    Comment: marijuana last use 3 xweek last used 02-23-2021   Sexual activity: Not Currently

## 2021-03-14 ENCOUNTER — Other Ambulatory Visit: Payer: Self-pay

## 2021-03-20 ENCOUNTER — Other Ambulatory Visit (HOSPITAL_COMMUNITY): Payer: Self-pay

## 2021-03-20 MED ORDER — DORZOLAMIDE HCL-TIMOLOL MAL 2-0.5 % OP SOLN
1.0000 [drp] | Freq: Two times a day (BID) | OPHTHALMIC | 11 refills | Status: DC
Start: 2021-03-20 — End: 2023-01-30
  Filled 2021-03-20: qty 10, 90d supply, fill #0
  Filled 2021-11-17: qty 10, 90d supply, fill #1

## 2021-03-20 MED ORDER — BRIMONIDINE TARTRATE 0.2 % OP SOLN
1.0000 [drp] | Freq: Two times a day (BID) | OPHTHALMIC | 11 refills | Status: DC
  Filled 2021-03-20: qty 5, 50d supply, fill #0
  Filled 2021-11-17: qty 5, 50d supply, fill #1

## 2021-03-26 ENCOUNTER — Other Ambulatory Visit: Payer: Self-pay

## 2021-03-26 ENCOUNTER — Other Ambulatory Visit (HOSPITAL_COMMUNITY): Payer: Self-pay

## 2021-03-26 MED ORDER — METOPROLOL SUCCINATE ER 25 MG PO TB24
37.5000 mg | ORAL_TABLET | Freq: Every day | ORAL | 3 refills | Status: DC
  Filled 2021-03-26: qty 135, 90d supply, fill #0
  Filled 2021-06-24: qty 135, 90d supply, fill #1

## 2021-03-26 MED ORDER — LOSARTAN POTASSIUM 25 MG PO TABS
12.5000 mg | ORAL_TABLET | Freq: Every day | ORAL | 3 refills | Status: DC
  Filled 2021-03-26: qty 45, 90d supply, fill #0
  Filled 2021-07-03: qty 45, 90d supply, fill #1

## 2021-03-26 MED ORDER — FUROSEMIDE 20 MG PO TABS
20.0000 mg | ORAL_TABLET | Freq: Every day | ORAL | 3 refills | Status: DC
  Filled 2021-03-26: qty 90, 90d supply, fill #0
  Filled 2021-08-15: qty 90, 90d supply, fill #1

## 2021-03-31 ENCOUNTER — Other Ambulatory Visit (HOSPITAL_COMMUNITY): Payer: Self-pay

## 2021-04-02 ENCOUNTER — Other Ambulatory Visit: Payer: Self-pay

## 2021-04-02 ENCOUNTER — Other Ambulatory Visit (HOSPITAL_COMMUNITY): Payer: Self-pay

## 2021-04-02 MED ORDER — ATORVASTATIN CALCIUM 80 MG PO TABS
80.0000 mg | ORAL_TABLET | Freq: Every day | ORAL | 3 refills | Status: DC
  Filled 2021-04-02: qty 90, 90d supply, fill #0
  Filled 2021-05-26: qty 90, 90d supply, fill #1
  Filled 2021-10-13: qty 90, 90d supply, fill #2

## 2021-04-24 ENCOUNTER — Other Ambulatory Visit (HOSPITAL_COMMUNITY): Payer: Self-pay

## 2021-05-26 ENCOUNTER — Other Ambulatory Visit (HOSPITAL_COMMUNITY): Payer: Self-pay

## 2021-05-29 ENCOUNTER — Other Ambulatory Visit (HOSPITAL_COMMUNITY): Payer: Self-pay

## 2021-06-04 ENCOUNTER — Other Ambulatory Visit: Payer: Self-pay | Admitting: Endocrinology

## 2021-06-04 ENCOUNTER — Other Ambulatory Visit (HOSPITAL_COMMUNITY): Payer: Self-pay

## 2021-06-04 MED ORDER — OZEMPIC (0.25 OR 0.5 MG/DOSE) 2 MG/1.5ML ~~LOC~~ SOPN
0.5000 mg | PEN_INJECTOR | SUBCUTANEOUS | 2 refills | Status: DC
  Filled 2021-06-04 – 2021-06-13 (×2): qty 1.5, 28d supply, fill #0

## 2021-06-10 ENCOUNTER — Ambulatory Visit (INDEPENDENT_AMBULATORY_CARE_PROVIDER_SITE_OTHER): Payer: Medicare Other | Admitting: Orthopedic Surgery

## 2021-06-10 DIAGNOSIS — L97521 Non-pressure chronic ulcer of other part of left foot limited to breakdown of skin: Secondary | ICD-10-CM | POA: Diagnosis not present

## 2021-06-10 DIAGNOSIS — B351 Tinea unguium: Secondary | ICD-10-CM | POA: Diagnosis not present

## 2021-06-11 ENCOUNTER — Other Ambulatory Visit (HOSPITAL_COMMUNITY): Payer: Self-pay

## 2021-06-12 ENCOUNTER — Other Ambulatory Visit (HOSPITAL_COMMUNITY): Payer: Self-pay

## 2021-06-13 ENCOUNTER — Ambulatory Visit: Payer: Medicaid Other | Admitting: Endocrinology

## 2021-06-13 ENCOUNTER — Other Ambulatory Visit (HOSPITAL_COMMUNITY): Payer: Self-pay

## 2021-06-18 ENCOUNTER — Encounter: Payer: Self-pay | Admitting: Orthopedic Surgery

## 2021-06-18 NOTE — Progress Notes (Signed)
? ?Office Visit Note ?  ?Patient: Curtis Watkins           ?Date of Birth: February 23, 1956           ?MRN: [id number] ?Visit Date: 06/10/2021 ?             ?Requested by: Camillia Herter, NP ?[address] ?[place],  [place] [address] ?PCP: Camillia Herter, NP ? ?Chief Complaint  ?Patient presents with  ? Left Foot - Follow-up  ? Right Foot - Follow-up  ? ? ? ? ?HPI: ?Patient is a 66 year old gentleman who presents for evaluation of onychomycotic nails as well as a Wagner grade 1 ulcer plantar aspect left foot. ? ?Assessment & Plan: ?Visit Diagnoses:  ?1. Onychomycosis   ?2. Non-pressure chronic ulcer of other part of left foot limited to breakdown of skin ([place])   ? ? ?Plan: Nails were trimmed x8 ulcer debrided x1 a felt relieving donut was fabricated. ? ?Follow-Up Instructions: Return in about 3 months (around 09/09/2021).  ? ?Ortho Exam ? ?Patient is alert, oriented, no adenopathy, well-dressed, normal affect, normal respiratory effort. ?Examination patient has thickened discolored onychomycotic nails x8 he is unable to safely trim his nails the nails were trimmed x8 without complications.  Patient has a Wagner grade 1 ulcer beneath the left first metatarsal head.  After informed consent a 10 blade knife was used to debride the skin and soft tissue back to healthy viable granulation tissue.  The ulcer was 1 cm diameter prior to debridement 3 cm in diameter after debridement 1 mm deep with healthy granulation tissue. ? ?Imaging: ?No results found. ?No images are attached to the encounter. ? ?Labs: ?Lab Results  ?Component Value Date  ? HGBA1C 6.7 (A) 02/07/2021  ? HGBA1C 6.7 (A) 11/01/2020  ? HGBA1C 8.1 (H) 08/02/2020  ? ? ? ?Lab Results  ?Component Value Date  ? ALBUMIN 4.0 08/02/2020  ? ? ?Lab Results  ?Component Value Date  ? MG 2.6 (H) 04/04/2020  ? MG 2.6 (H) 04/04/2020  ? MG 2.9 (H) 04/03/2020  ? ?No results found for: VD25OH ? ?No results found for: PREALBUMIN ? ?  Latest Ref Rng & Units 03/03/2021  ?  8:50 AM  08/02/2020  ? 11:46 AM 05/24/2020  ?  6:13 PM  ?CBC EXTENDED  ?WBC 3.4 - 10.8 x10E3/uL  8.0   7.8    ?RBC 4.14 - 5.80 x10E6/uL  3.92   3.02    ?Hemoglobin 13.0 - 17.0 g/dL 12.2   11.4   8.7    ?HCT 39.0 - 52.0 % 36.0   34.9   27.8    ?Platelets 150 - 450 x10E3/uL  237   408    ? ? ? ?There is no height or weight on file to calculate BMI. ? ?Orders:  ?No orders of the defined types were placed in this encounter. ? ?No orders of the defined types were placed in this encounter. ? ? ? Procedures: ?No procedures performed ? ?Clinical Data: ?No additional findings. ? ?ROS: ? ?All other systems negative, except as noted in the HPI. ?Review of Systems ? ?Objective: ?Vital Signs: There were no vitals taken for this visit. ? ?Specialty Comments:  ?No specialty comments available. ? ?PMFS History: ?Patient Active Problem List  ? Diagnosis Date Noted  ? Erectile dysfunction 03/03/2021  ? HFrEF (heart failure with reduced ejection fraction) ([place]) 08/21/2020  ? Acute on chronic systolic and diastolic heart failure, NYHA class 3 ([place]) 05/25/2020  ?  Pleural effusion   ? Gangrene of toe of right foot ([place])   ? 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 Coronary artery disease involving native coronary artery of native heart with unstable angina pectoris ([place])   ? Acute congestive heart failure ([place]) 03/27/2020  ? Diabetic foot ulcers ([place]) 03/27/2020  ? DM2 (diabetes mellitus, type 2) ([place]) 03/27/2020  ? HTN (hypertension) 03/27/2020  ? ?Past Medical History:  ?Diagnosis Date  ? Blind right eye   ? Carotid artery disease ([place])   ? 04/01/20: 60-79% RICA stenosis, 123456 LICA stenosis  ? Chronic kidney disease   ? ckd stage 3 a per dr Malva Limes 02-05-2021 epic  ? Coronary artery disease   ? NSTEMI s/p CABG in 03/2020 (LIMA-->LAD/diag, L radial-->OM1/OM3); b.  ? DM type 2   ? Glaucoma of right eye, unspecified glaucoma type   ? HFrEF (heart failure with reduced ejection fraction) ([place])   ? Echo 6/22: EF 45, global HK, mild MR, RVSP 31.9  ? Hypertension    ? Ischemic cardiomyopathy   ? PAD (peripheral artery disease) ([place])   ? occluded right axillary artery 03/29/20   ?  ?Family History  ?Problem Relation Age of Onset  ? Diabetes Mother   ?  ?Past Surgical History:  ?Procedure Laterality Date  ? AMPUTATION Right 04/24/2020  ? Procedure: RIGHT 2ND AND 3RD TOE AMPUTATION;  Surgeon: Newt Minion, MD;  Location: [facility];  Service: Orthopedics;  Laterality: Right;  ? CENTRAL VENOUS CATHETER INSERTION Left 04/03/2020  ? Procedure: INSERTION OF FEMORAL ARTERIAL LINE;  Surgeon: Wonda Olds, MD;  Location: [facility];  Service: Open Heart Surgery;  Laterality: Left;  ? CORONARY ARTERY BYPASS GRAFT N/A 04/03/2020  ? Procedure: CORONARY ARTERY BYPASS GRAFTING (CABG) TIMES FOUR USING LEFT INTERNAL MAMMARY ARTERY AND LEFT RADIAL ARTERY.;  Surgeon: Wonda Olds, MD;  Location: [facility];  Service: Open Heart Surgery;  Laterality: N/A;  ? CORONARY PRESSURE WIRE/FFR WITH 3D MAPPING N/A 03/29/2020  ? Procedure: Coronary Pressure Wire/FFR w/3D Mapping;  Surgeon: Belva Crome, MD;  Location: [facility] CV LAB;  Service: Cardiovascular;  Laterality: N/A;  ? EYE SURGERY Right 01/21/2021  ? right eye cataract lens replacement surgery  ? IR THORACENTESIS ASP PLEURAL SPACE W/IMG GUIDE  05/03/2020  ? IR THORACENTESIS ASP PLEURAL SPACE W/IMG GUIDE  05/16/2020  ? LEFT HEART CATH AND CORONARY ANGIOGRAPHY N/A 03/29/2020  ? Procedure: LEFT HEART CATH AND CORONARY ANGIOGRAPHY;  Surgeon: Belva Crome, MD;  Location: [facility] CV LAB;  Service: Cardiovascular;  Laterality: N/A;  ? PENILE PROSTHESIS IMPLANT N/A 03/03/2021  ? Procedure: PENILE PROTHESIS INFLATABLE [place];  Surgeon: Lucas Mallow, MD;  Location: [facility];  Service: Urology;  Laterality: N/A;  REQUESTING 2 HRS  ? RADIAL ARTERY HARVEST Left 04/03/2020  ? Procedure: LEFT RADIAL ARTERY HARVEST;  Surgeon: Wonda Olds, MD;  Location: [facility];  Service: Open Heart Surgery;  Laterality: Left;  ? TEE WITHOUT  CARDIOVERSION N/A 04/03/2020  ? Procedure: TRANSESOPHAGEAL ECHOCARDIOGRAM (TEE);  Surgeon: Wonda Olds, MD;  Location: [facility];  Service: Open Heart Surgery;  Laterality: N/A;  ? UPPER EXTREMITY ANGIOGRAPHY  03/29/2020  ? Procedure: Upper Extremity Angiography;  Surgeon: Belva Crome, MD;  Location: [place] CV LAB;  Service: Cardiovascular;;  rt.  arm  ? ?Social History  ? ?Occupational History  ? Occupation: retired d/t medical conditions  ?Tobacco Use  ? Smoking status: Former  ?  Packs/day: 0.25  ?  Years: 2.00  ?  Pack years: 0.50  ?  Types: Cigarettes  ?  Quit date: 08/23/2020  ?  Years since quitting: 0.8  ? Smokeless tobacco: Never  ?Vaping Use  ? Vaping Use: Never used  ?Substance and Sexual Activity  ? Alcohol use: Not Currently  ?  Alcohol/week: 1.0 standard drink  ?  Types: 1 Cans of beer per week  ?  Comment: occassionally beer  ? Drug use: Not Currently  ?  Types: Marijuana  ?  Comment: marijuana last use 3 xweek last used 02-23-2021  ? Sexual activity: Not Currently  ? ? ? ? ? ?

## 2021-06-24 ENCOUNTER — Other Ambulatory Visit (HOSPITAL_BASED_OUTPATIENT_CLINIC_OR_DEPARTMENT_OTHER): Payer: Self-pay

## 2021-06-24 ENCOUNTER — Other Ambulatory Visit (HOSPITAL_COMMUNITY): Payer: Self-pay

## 2021-06-25 ENCOUNTER — Other Ambulatory Visit: Payer: Self-pay

## 2021-06-25 ENCOUNTER — Other Ambulatory Visit (HOSPITAL_COMMUNITY): Payer: Self-pay

## 2021-06-25 MED ORDER — SPIRONOLACTONE 25 MG PO TABS
12.5000 mg | ORAL_TABLET | Freq: Every day | ORAL | 2 refills | Status: DC
  Filled 2021-06-25: qty 45, 90d supply, fill #0
  Filled 2021-09-17: qty 45, 90d supply, fill #1

## 2021-07-01 ENCOUNTER — Ambulatory Visit (INDEPENDENT_AMBULATORY_CARE_PROVIDER_SITE_OTHER): Payer: 59 | Admitting: Orthopedic Surgery

## 2021-07-01 DIAGNOSIS — L97521 Non-pressure chronic ulcer of other part of left foot limited to breakdown of skin: Secondary | ICD-10-CM | POA: Diagnosis not present

## 2021-07-03 ENCOUNTER — Other Ambulatory Visit (HOSPITAL_COMMUNITY): Payer: Self-pay

## 2021-07-03 ENCOUNTER — Other Ambulatory Visit: Payer: Self-pay | Admitting: Family Medicine

## 2021-07-03 DIAGNOSIS — E1100 Type 2 diabetes mellitus with hyperosmolarity without nonketotic hyperglycemic-hyperosmolar coma (NKHHC): Secondary | ICD-10-CM

## 2021-07-03 NOTE — Telephone Encounter (Signed)
Patient followed by [facility]. Request refills from the same.

## 2021-07-04 ENCOUNTER — Other Ambulatory Visit (HOSPITAL_COMMUNITY): Payer: Self-pay

## 2021-07-09 ENCOUNTER — Other Ambulatory Visit: Payer: Self-pay | Admitting: Family

## 2021-07-09 DIAGNOSIS — E1100 Type 2 diabetes mellitus with hyperosmolarity without nonketotic hyperglycemic-hyperosmolar coma (NKHHC): Secondary | ICD-10-CM

## 2021-07-09 NOTE — Telephone Encounter (Signed)
Requested medications are due for refill today.  Unsure - last filled 09/2020 ? ?Requested medications are on the active medications list.  yes ? ?Last refill. 09/24/2020 #30 2 refills ? ?Future visit scheduled.   no ? ?Notes to clinic.  Labs are expired. ? ? ? ?Requested Prescriptions  ?Pending Prescriptions Disp Refills  ? empagliflozin (JARDIANCE) 25 MG TABS tablet 30 tablet 2  ?  Sig: Take 1 tablet (25 mg total) by mouth daily before breakfast.  ?  ? Endocrinology:  Diabetes - SGLT2 Inhibitors Failed - 07/09/2021  1:40 PM  ?  ?  Failed - Cr in normal range and within 360 days  ?  Creatinine, Ser  ?Date Value Ref Range Status  ?03/03/2021 1.90 (H) 0.61 - 1.24 mg/dL Final  ?   ?  ?  Failed - eGFR in normal range and within 360 days  ?  GFR, Estimated  ?Date Value Ref Range Status  ?06/19/2020 54 (L) >60 mL/min Final  ?  Comment:  ?  (NOTE) ?Calculated using the CKD-EPI Creatinine Equation (2021) ?  ? ?eGFR  ?Date Value Ref Range Status  ?08/02/2020 48 (L) >59 mL/min/1.73 Final  ?   ?  ?  Failed - Valid encounter within last 6 months  ?  Recent Outpatient Visits   ? ?      ? 8 months ago Type 2 diabetes mellitus with hyperosmolarity without coma, without long-term current use of insulin ([place])  ? [place], RPH-CPP  ? 9 months ago Type 2 diabetes mellitus with hyperosmolarity without coma, without long-term current use of insulin ([place])  ? [place], RPH-CPP  ? 10 months ago Type 2 diabetes mellitus with hyperosmolarity without coma, without long-term current use of insulin ([place])  ? [place], RPH-CPP  ? 11 months ago Annual physical exam  ? Primary Care at [facility], [place], NP  ? 1 year ago Encounter to establish care  ? Primary Care at [facility], [place], NP  ? ?  ?  ?Future Appointments   ? ?        ? In 2 weeks Newt Minion, MD  [facility]  ? ?  ? ? ?  ?  ?  Passed - HBA1C is between 0 and 7.9 and within 180 days  ?  Hemoglobin A1C  ?Date Value Ref Range Status  ?02/07/2021 6.7 (A) 4.0 - 5.6 % Final  ? ?Hgb A1c MFr Bld  ?Date Value Ref Range Status  ?08/02/2020 8.1 (H) 4.8 - 5.6 % Final  ?  Comment:  ?           Prediabetes: 5.7 - 6.4 ?         Diabetes: >6.4 ?         Glycemic control for adults with diabetes: <7.0 ?  ?   ?  ?  ?  ?

## 2021-07-09 NOTE — Telephone Encounter (Unsigned)
Copied from [place] [id number]. Topic: General - Other ?>> Jul 09, 2021 12:51 PM Tessa Lerner A wrote: ?Reason for CRM: Medication Refill - Medication: Rx #: [id number]  ?empagliflozin (JARDIANCE) 25 MG TABS tablet [id number]  ? ? ?Has the patient contacted their pharmacy? Yes.  The patient was directed to contact their PCP ?(Agent: If no, request that the patient contact the pharmacy for the refill. If patient does not wish to contact the pharmacy document the reason why and proceed with request.) ?(Agent: If yes, when and what did the pharmacy advise?) ? ?Preferred Pharmacy (with phone number or street name): [facility] [address] ?Phone: [phone] Fax: [phone] ?Hours: M-F 7:30am-6pm ? ? ?Has the patient been seen for an appointment in the last year OR does the patient have an upcoming appointment? Yes.   ? ?Agent: Please be advised that RX refills may take up to 3 business days. We ask that you follow-up with your pharmacy. ?

## 2021-07-10 ENCOUNTER — Other Ambulatory Visit: Payer: Self-pay

## 2021-07-11 ENCOUNTER — Other Ambulatory Visit: Payer: Self-pay | Admitting: Family Medicine

## 2021-07-11 ENCOUNTER — Other Ambulatory Visit (HOSPITAL_COMMUNITY): Payer: Self-pay

## 2021-07-11 DIAGNOSIS — E1100 Type 2 diabetes mellitus with hyperosmolarity without nonketotic hyperglycemic-hyperosmolar coma (NKHHC): Secondary | ICD-10-CM

## 2021-07-11 MED ORDER — EMPAGLIFLOZIN 25 MG PO TABS
25.0000 mg | ORAL_TABLET | Freq: Every day | ORAL | 0 refills | Status: DC
  Filled 2021-07-11 (×2): qty 30, 30d supply, fill #0

## 2021-07-11 NOTE — Telephone Encounter (Signed)
Pt has appt 6/23

## 2021-07-20 ENCOUNTER — Ambulatory Visit
Admission: RE | Admit: 2021-07-20 | Discharge: 2021-07-20 | Disposition: A | Discharge status: Home / Self Care | Payer: Medicaid Other | Source: Ambulatory Visit | Attending: Orthopedic Surgery | Admitting: Orthopedic Surgery

## 2021-07-20 DIAGNOSIS — L97521 Non-pressure chronic ulcer of other part of left foot limited to breakdown of skin: Secondary | ICD-10-CM

## 2021-07-22 ENCOUNTER — Other Ambulatory Visit (HOSPITAL_COMMUNITY): Payer: Self-pay

## 2021-07-22 MED ORDER — OZEMPIC (0.25 OR 0.5 MG/DOSE) 2 MG/3ML ~~LOC~~ SOPN
0.5000 mg | PEN_INJECTOR | SUBCUTANEOUS | 1 refills | Status: DC
  Filled 2021-07-22: qty 3, 28d supply, fill #0
  Filled 2021-09-09: qty 3, 28d supply, fill #1

## 2021-07-24 ENCOUNTER — Other Ambulatory Visit (HOSPITAL_BASED_OUTPATIENT_CLINIC_OR_DEPARTMENT_OTHER): Payer: Self-pay

## 2021-07-24 ENCOUNTER — Other Ambulatory Visit (HOSPITAL_COMMUNITY): Payer: Self-pay

## 2021-07-26 NOTE — Progress Notes (Signed)
Patient ID: Curtis Watkins, male    DOB: 16-Jun-1955  MRN: [id number]  CC: Exposure to STD   Subjective: Curtis Watkins is a 66 y.o. male who presents for exposure to STD.   His concerns today include:  - Reports recently his partner informed him that she has trichomonas. Reports that he only has one sexual partner and to his knowledge she only has one sexual partner as well. He denies any genitourinary symptoms.  -Requesting refills of Jardiance. He is established with [facility] with last appointment being 02/07/2021. He recently was a no show on 06/13/2021.  Patient Active Problem List   Diagnosis Date Noted   Erectile dysfunction 03/03/2021   HFrEF (heart failure with reduced ejection fraction) ([place]) 08/21/2020   Acute on chronic systolic and diastolic heart failure, NYHA class 3 ([place]) 05/25/2020   Pleural effusion    Gangrene of toe of right foot (HCC)    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   Coronary artery disease involving native coronary artery of native heart with unstable angina pectoris ([place])    Acute congestive heart failure ([place]) 03/27/2020   Diabetic foot ulcers ([place]) 03/27/2020   DM2 (diabetes mellitus, type 2) ([place]) 03/27/2020   HTN (hypertension) 03/27/2020     Current Outpatient Medications on File Prior to Visit  Medication Sig Dispense Refill   aspirin 81 MG tablet Take 1 tablet (81 mg total) by mouth daily.     atorvastatin (LIPITOR) 80 MG tablet Take 1 tablet (80 mg total) by mouth daily. 90 tablet 3   blood glucose meter kit and supplies by Other route as directed. Dispense based on patient and insurance preference. Use up to four times daily as directed. (FOR ICD-10 E10.9, E11.9).     brimonidine (ALPHAGAN) 0.2 % ophthalmic solution Place 1 drop into the left eye 2 (two) times daily. 15 mL 11   dorzolamide-timolol (COSOPT) 22.3-6.8 MG/ML ophthalmic solution Place 1 drop into the left eye 2 (two) times daily. 10 mL 11   furosemide (LASIX) 20 MG tablet Take 1 tablet  (20 mg total) by mouth daily. 90 tablet 3   HYDROcodone-acetaminophen (NORCO/VICODIN) 5-325 MG tablet Take 1 tablet by mouth every 4 hours as needed for moderate pain. 20 tablet 0   losartan (COZAAR) 25 MG tablet Take 0.5 tablets (12.5 mg total) by mouth daily. 45 tablet 3   metoprolol succinate (TOPROL XL) 25 MG 24 hr tablet Take 1.5 tablets (37.5 mg total) by mouth daily. 135 tablet 3   OVER THE COUNTER MEDICATION Apply 1 application topically daily. Diabetic lotion     Semaglutide,0.25 or 0.5MG/DOS, (OZEMPIC, 0.25 OR 0.5 MG/DOSE,) 2 MG/3ML SOPN Inject 0.5 mg into the skin once a week. 3 mL 1   spironolactone (ALDACTONE) 25 MG tablet Take 0.5 tablets (12.5 mg total) by mouth daily. 45 tablet 2   No current facility-administered medications on file prior to visit.    No Known Allergies  Social History   Socioeconomic History   Marital status: Significant Other    Spouse name: Not on file   Number of children: 3   Years of education: Not on file   Highest education level: High school graduate  Occupational History   Occupation: retired d/t medical conditions  Tobacco Use   Smoking status: Former    Packs/day: 0.25    Years: 2.00    Pack years: 0.50    Types: Cigarettes    Quit date: 08/23/2020    Years since quitting:  0.9   Smokeless tobacco: Never  Vaping Use   Vaping Use: Never used  Substance and Sexual Activity   Alcohol use: Not Currently    Alcohol/week: 1.0 standard drink    Types: 1 Cans of beer per week    Comment: occassionally beer   Drug use: Not Currently    Types: Marijuana    Comment: marijuana last use 3 xweek last used 02-23-2021   Sexual activity: Not Currently  Other Topics Concern   Not on file  Social History Narrative   Not on file   Social Determinants of Health   Financial Resource Strain: Not on file  Food Insecurity: Not on file  Transportation Needs: No Transportation Needs   Lack of Transportation (Medical): No   Lack of Transportation  (Non-Medical): No  Physical Activity: Not on file  Stress: Not on file  Social Connections: Not on file  Intimate Partner Violence: Not on file    Family History  Problem Relation Age of Onset   Diabetes Mother     Past Surgical History:  Procedure Laterality Date   AMPUTATION Right 04/24/2020   Procedure: RIGHT 2ND AND 3RD TOE AMPUTATION;  Surgeon: Nadara Mustard, MD;  Location[facility];  Service: Orthopedics;  Laterality: Right;   CENTRAL VENOUS CATHETER INSERTION Left 04/03/2020   Procedure: INSERTION OF FEMORAL ARTERIAL LINE;  Surgeon: Linden Dolin, MD;  Location: [facility] OR;  Service: Open Heart Surgery;  Laterality: Left;   CORONARY ARTERY BYPASS GRAFT N/A 04/03/2020   Procedure: CORONARY ARTERY BYPASS GRAFTING (CABG) TIMES FOUR USING LEFT INTERNAL MAMMARY ARTERY AND LEFT RADIAL ARTERY.;  Surgeon: Linden Dolin, MD;  Location: [facility] OR;  Service: Open Heart Surgery;  Laterality: N/A;   CORONARY PRESSURE WIRE/FFR WITH 3D MAPPING N/A 03/29/2020   Procedure: Coronary Pressure Wire/FFR w/3D Mapping;  Surgeon: Lyn Records, MD;  Location: [facility] INVASIVE CV LAB;  Service: Cardiovascular;  Laterality: N/A;   EYE SURGERY Right 01/21/2021   right eye cataract lens replacement surgery   IR THORACENTESIS ASP PLEURAL SPACE W/IMG GUIDE  05/03/2020   IR THORACENTESIS ASP PLEURAL SPACE W/IMG GUIDE  05/16/2020   LEFT HEART CATH AND CORONARY ANGIOGRAPHY N/A 03/29/2020   Procedure: LEFT HEART CATH AND CORONARY ANGIOGRAPHY;  Surgeon: Lyn Records, MD;  [facility]: [facility]VAS[facility] LAB;  Service: Cardiovascular;  Laterality: N/A;   PENILE PROSTHESIS IMPLANT N/A 03/03/2021   Procedure: PENILE PROTHESIS INFLATABLE COLOPLAST;  Surgeon: Crista Elliot, MD;  Location: [facility];  Service: Urology;  Laterality: N/A;  REQUESTING 2 HRS   RADIAL ARTERY HARVEST Left 04/03/2020   Procedure: LEFT RADIAL ARTERY HARVEST;  Surgeon: Linden Dolin, MD;  Location: [facility] OR;  Service: Open Heart  Surgery;  Laterality: Left;   TEE WITHOUT CARDIOVERSION N/A 04/03/2020   Procedure: TRANSESOPHAGEAL ECHOCARDIOGRAM (TEE);  Surgeon: Linden Dolin, MD;  Location: [facility] OR;  Service: Open Heart Surgery;  Laterality: N/A;   UPPER EXTREMITY ANGIOGRAPHY  03/29/2020   Procedure: Upper Extremity Angiography;  Surgeon: Lyn Records, MD;  Location: [facility] INVASIVE CV LAB;  Service: Cardiovascular;;  rt.  arm    ROS: Review of Systems Negative except as stated above  PHYSICAL EXAM: BP 131/74 (BP Location: Left Arm, Patient Position: Sitting, Cuff Size: Normal)   Pulse 79   Temp 98.3 F (36.8 C)   Resp 18   Ht 5' 10.98" (1.803 m)   Wt 162 lb (73.5 kg)   SpO2 98%  BMI 22.60 kg/m   Physical Exam HENT:     Head: Normocephalic and atraumatic.  Eyes:     Extraocular Movements: Extraocular movements intact.     Conjunctiva/sclera: Conjunctivae normal.     Pupils: Pupils are equal, round, and reactive to light.  Cardiovascular:     Rate and Rhythm: Normal rate and regular rhythm.     Pulses: Normal pulses.     Heart sounds: Normal heart sounds.  Pulmonary:     Effort: Pulmonary effort is normal.     Breath sounds: Normal breath sounds.  Musculoskeletal:     Cervical back: Normal range of motion and neck supple.  Neurological:     General: No focal deficit present.     Mental Status: He is alert and oriented to person, place, and time.  Psychiatric:        Mood and Affect: Mood normal.        Behavior: Behavior normal.   ASSESSMENT AND PLAN: 1. Screening for STD (sexually transmitted disease) - Urine cytology to screen for gonorrhea, chlamydia, and trichomonas.  - Urine cytology ancillary only  2. Type 2 diabetes mellitus with hyperosmolarity without coma, without long-term current use of insulin (HCC) - Continue Empagliflozin as prescribed. - Encouraged to schedule follow-up appointment with established [facility].  - A new referral to Endocrinology ordered should  patient be unable to reschedule recent no show on 06/13/2021 at [facility].  - Ambulatory referral to Endocrinology - empagliflozin (JARDIANCE) 25 MG TABS tablet; Take 1 tablet (25 mg total) by mouth daily before breakfast.  Dispense: 30 tablet; Refill: 0    Patient was given the opportunity to ask questions.  Patient verbalized understanding of the plan and was able to repeat key elements of the plan. Patient was given clear instructions to go to Emergency Department or return to medical center if symptoms don't improve, worsen, or new problems develop.The patient verbalized understanding.   Orders Placed This Encounter  Procedures   Ambulatory referral to Endocrinology     Requested Prescriptions   Signed Prescriptions Disp Refills   empagliflozin (JARDIANCE) 25 MG TABS tablet 30 tablet 0    Sig: Take 1 tablet (25 mg total) by mouth daily before breakfast.    Follow-up with primary provider as scheduled.  Camillia Herter, NP

## 2021-07-29 ENCOUNTER — Encounter: Payer: Self-pay | Admitting: Orthopedic Surgery

## 2021-07-29 ENCOUNTER — Ambulatory Visit (INDEPENDENT_AMBULATORY_CARE_PROVIDER_SITE_OTHER): Payer: 59 | Admitting: Orthopedic Surgery

## 2021-07-29 DIAGNOSIS — L97511 Non-pressure chronic ulcer of other part of right foot limited to breakdown of skin: Secondary | ICD-10-CM

## 2021-07-29 DIAGNOSIS — L97521 Non-pressure chronic ulcer of other part of left foot limited to breakdown of skin: Secondary | ICD-10-CM

## 2021-07-29 DIAGNOSIS — B351 Tinea unguium: Secondary | ICD-10-CM

## 2021-07-29 NOTE — Progress Notes (Signed)
Office Visit Note   Patient: Curtis Watkins           Date of Birth: 1955-12-15           MRN: [id number] Visit Date: 07/29/2021              Requested by: Camillia Herter, NP [address] [place],  [place] [address] PCP: Camillia Herter, NP  Chief Complaint  Patient presents with   Left Foot - Follow-up      HPI: Patient is a 66 year old gentleman who is presents in follow-up for both feet.  Patient is using a felt relieving donut for the ulcer beneath the first metatarsal head left foot.  Assessment & Plan: Visit Diagnoses:  1. Non-pressure chronic ulcer of other part of left foot limited to breakdown of skin ([place])   2. Onychomycosis   3. Non-pressure chronic ulcer of other part of right foot limited to breakdown of skin ([place])     Plan: Ulcers were debrided x2 nails trimmed x8.  Reevaluate in 4 weeks.  Follow-Up Instructions: Return in about 4 weeks (around 08/26/2021).   Ortho Exam  Patient is alert, oriented, no adenopathy, well-dressed, normal affect, normal respiratory effort. Examination patient has a Wagner grade 1 ulcer beneath the first metatarsal head left foot.  After informed consent a 10 blade knife was used to debride the skin and soft tissue back to bleeding viable granulation tissue.  There is no tunneling no exposed bone or tendon.  Silver nitrate was used for hemostasis gauze and Band-Aid was applied the wound was 2 cm in diameter prior to debridement 3 cm in diameter after debridement.  Examination patient has thickened discolored onychomycotic nails x8 he is unable to trim the nails on his own and the nails were trimmed x8 without complications.  Examination the right foot patient has a Wagner grade 1 ulcer beneath the first metatarsal head of the right foot.  The ulcer is 1 cm diameter.  After informed consent a 10 blade knife was used to debride the skin and soft tissue back to healthy viable granulation tissue.  Band-Aid was applied there is no  tunneling no cellulitis no odor no drainage.  Ulcer on the right foot was 2 cm in diameter after debridement.  Imaging: No results found. No images are attached to the encounter.  Labs: Lab Results  Component Value Date   HGBA1C 6.7 (A) 02/07/2021   HGBA1C 6.7 (A) 11/01/2020   HGBA1C 8.1 (H) 08/02/2020     Lab Results  Component Value Date   ALBUMIN 4.0 08/02/2020    Lab Results  Component Value Date   MG 2.6 (H) 04/04/2020   MG 2.6 (H) 04/04/2020   MG 2.9 (H) 04/03/2020   No results found for: VD25OH  No results found for: PREALBUMIN    Latest Ref Rng & Units 03/03/2021    8:50 AM 08/02/2020   11:46 AM 05/24/2020    6:13 PM  CBC EXTENDED  WBC 3.4 - 10.8 x10E3/uL  8.0   7.8    RBC 4.14 - 5.80 x10E6/uL  3.92   3.02    Hemoglobin 13.0 - 17.0 g/dL 12.2   11.4   8.7    HCT 39.0 - 52.0 % 36.0   34.9   27.8    Platelets 150 - 450 x10E3/uL  237   408       There is no height or weight on file to calculate BMI.  Orders:  No orders of the defined types were placed in this encounter.  No orders of the defined types were placed in this encounter.    Procedures: No procedures performed  Clinical Data: No additional findings.  ROS:  All other systems negative, except as noted in the HPI. Review of Systems  Objective: Vital Signs: There were no vitals taken for this visit.  Specialty Comments:  No specialty comments available.  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   Coronary artery disease involving native coronary artery of native heart with unstable angina pectoris ([place])    Acute congestive heart failure ([place]) 03/27/2020   Diabetic foot ulcers ([place])  03/27/2020   DM2 (diabetes mellitus, type 2) ([place]) 03/27/2020   HTN (hypertension) 03/27/2020   Past Medical History:  Diagnosis Date   Blind right eye    Carotid artery disease ([place])    04/01/20: 60-79% RICA stenosis, 123456 LICA stenosis   Chronic kidney disease    ckd stage 3 a per dr Malva Limes 02-05-2021 epic   Coronary artery disease    NSTEMI s/p CABG in 03/2020 (LIMA-->LAD/diag, L radial-->OM1/OM3); b.   DM type 2    Glaucoma of right eye, unspecified glaucoma type    HFrEF (heart failure with reduced ejection fraction) ([place])    Echo 6/22: EF 45, global HK, mild MR, RVSP 31.9   Hypertension    Ischemic cardiomyopathy    PAD (peripheral artery disease) (HCC)    occluded right axillary artery 03/29/20     Family History  Problem Relation Age of Onset   Diabetes Mother     Past Surgical History:  Procedure Laterality Date   AMPUTATION Right 04/24/2020   Procedure: RIGHT 2ND AND 3RD TOE AMPUTATION;  Surgeon: Newt Minion, MD;  Location: [facility];  Service: Orthopedics;  Laterality: Right;   CENTRAL VENOUS CATHETER INSERTION Left 04/03/2020   Procedure: INSERTION OF FEMORAL ARTERIAL LINE;  Surgeon: Wonda Olds, MD;  Location: [facility];  Service: Open Heart Surgery;  Laterality: Left;   CORONARY ARTERY BYPASS GRAFT N/A 04/03/2020   Procedure: CORONARY ARTERY BYPASS GRAFTING (CABG) TIMES FOUR USING LEFT INTERNAL MAMMARY ARTERY AND LEFT RADIAL ARTERY.;  Surgeon: Wonda Olds, MD;  Location: [facility];  Service: Open Heart Surgery;  Laterality: N/A;   CORONARY PRESSURE WIRE/FFR WITH 3D MAPPING N/A 03/29/2020   Procedure: Coronary Pressure Wire/FFR w/3D Mapping;  Surgeon: Belva Crome, MD;  Location: [facility];  Service: Cardiovascular;  Laterality: N/A;   EYE SURGERY Right 01/21/2021   right eye cataract lens replacement surgery   IR THORACENTESIS ASP PLEURAL SPACE W/IMG GUIDE  05/03/2020   IR THORACENTESIS ASP PLEURAL SPACE W/IMG GUIDE  05/16/2020   LEFT HEART CATH  AND CORONARY ANGIOGRAPHY N/A 03/29/2020   Procedure: LEFT HEART CATH AND CORONARY ANGIOGRAPHY;  Surgeon: Belva Crome, MD;  Location: [facility] LAB;  Service: Cardiovascular;  Laterality: N/A;   PENILE PROSTHESIS IMPLANT N/A 03/03/2021   Procedure: PENILE PROTHESIS INFLATABLE COLOPLAST;  Surgeon: Lucas Mallow, MD;  Location: [facility];  Service: Urology;  Laterality: N/A;  REQUESTING 2 HRS   RADIAL ARTERY  HARVEST Left 04/03/2020   Procedure: LEFT RADIAL ARTERY HARVEST;  Surgeon: Wonda Olds, MD;  Location: [facility];  Service: Open Heart Surgery;  Laterality: Left;   TEE WITHOUT CARDIOVERSION N/A 04/03/2020   Procedure: TRANSESOPHAGEAL ECHOCARDIOGRAM (TEE);  Surgeon: Wonda Olds, MD;  Location: [facility];  Service: Open Heart Surgery;  Laterality: N/A;   UPPER EXTREMITY ANGIOGRAPHY  03/29/2020   Procedure: Upper Extremity Angiography;  Surgeon: Belva Crome, MD;  Location: [place] CV LAB;  Service: Cardiovascular;;  rt.  arm   Social History   Occupational History   Occupation: retired d/t medical conditions  Tobacco Use   Smoking status: Former    Packs/day: 0.25    Years: 2.00    Pack years: 0.50    Types: Cigarettes    Quit date: 08/23/2020    Years since quitting: 0.9   Smokeless tobacco: Never  Vaping Use   Vaping Use: Never used  Substance and Sexual Activity   Alcohol use: Not Currently    Alcohol/week: 1.0 standard drink    Types: 1 Cans of beer per week    Comment: occassionally beer   Drug use: Not Currently    Types: Marijuana    Comment: marijuana last use 3 xweek last used 02-23-2021   Sexual activity: Not Currently

## 2021-07-30 ENCOUNTER — Other Ambulatory Visit (HOSPITAL_COMMUNITY)
Admission: RE | Admit: 2021-07-30 | Discharge: 2021-07-30 | Disposition: A | Discharge status: Home / Self Care | Payer: 59 | Source: Ambulatory Visit | Attending: Family | Admitting: Family

## 2021-07-30 ENCOUNTER — Other Ambulatory Visit (HOSPITAL_COMMUNITY): Payer: Self-pay

## 2021-07-30 ENCOUNTER — Ambulatory Visit (INDEPENDENT_AMBULATORY_CARE_PROVIDER_SITE_OTHER): Payer: 59 | Admitting: Family

## 2021-07-30 VITALS — BP 131/74 | HR 79 | Temp 98.3°F | Resp 18 | Ht 70.98 in | Wt 162.0 lb

## 2021-07-30 DIAGNOSIS — A599 Trichomoniasis, unspecified: Secondary | ICD-10-CM | POA: Diagnosis not present

## 2021-07-30 DIAGNOSIS — E1100 Type 2 diabetes mellitus with hyperosmolarity without nonketotic hyperglycemic-hyperosmolar coma (NKHHC): Secondary | ICD-10-CM

## 2021-07-30 DIAGNOSIS — Z113 Encounter for screening for infections with a predominantly sexual mode of transmission: Secondary | ICD-10-CM | POA: Insufficient documentation

## 2021-07-30 MED ORDER — EMPAGLIFLOZIN 25 MG PO TABS
25.0000 mg | ORAL_TABLET | Freq: Every day | ORAL | 0 refills | Status: DC
  Filled 2021-07-30: qty 30, 30d supply, fill #0

## 2021-07-30 NOTE — Progress Notes (Signed)
Pt presents for exposure to STD states that his partner tested positive for Trichomonas

## 2021-07-31 ENCOUNTER — Other Ambulatory Visit (HOSPITAL_COMMUNITY): Payer: Self-pay

## 2021-07-31 ENCOUNTER — Other Ambulatory Visit: Payer: Self-pay | Admitting: Family

## 2021-07-31 DIAGNOSIS — A599 Trichomoniasis, unspecified: Secondary | ICD-10-CM | POA: Insufficient documentation

## 2021-07-31 LAB — URINE CYTOLOGY ANCILLARY ONLY
Chlamydia: NEGATIVE
Comment: NEGATIVE
Comment: NEGATIVE
Comment: NORMAL
Neisseria Gonorrhea: NEGATIVE
Trichomonas: POSITIVE — AB

## 2021-07-31 MED ORDER — METRONIDAZOLE 500 MG PO TABS
500.0000 mg | ORAL_TABLET | Freq: Two times a day (BID) | ORAL | 0 refills | Status: AC
  Filled 2021-07-31: qty 14, 7d supply, fill #0

## 2021-08-03 ENCOUNTER — Encounter: Payer: Self-pay | Admitting: Orthopedic Surgery

## 2021-08-03 NOTE — Progress Notes (Signed)
Office Visit Note   Patient: Curtis Watkins           Date of Birth: 1955/03/10           MRN: [id number] Visit Date: 07/01/2021              Requested by: Camillia Herter, NP [address] [place],  [place] [address] PCP: Camillia Herter, NP  Chief Complaint  Patient presents with   Left Foot - Follow-up      HPI: Patient is a 66 year old gentleman who presents in follow-up for [facility] grade 1 ulcer left foot.  Patient has been using a felt relieving donut full weightbearing in regular shoes no dressing.  Assessment & Plan: Visit Diagnoses:  1. Non-pressure chronic ulcer of other part of left foot limited to breakdown of skin ([place])     Plan: Ulcer was debrided we will request an MRI scan to evaluate for possible osteomyelitis of the first metatarsal head.  Follow-Up Instructions: Return in about 4 weeks (around 07/29/2021).   Ortho Exam  Patient is alert, oriented, no adenopathy, well-dressed, normal affect, normal respiratory effort. Examination patient is strong dorsalis pedis pulse he has good pressure relief in the shoe he has a Wagner grade 1 ulcer beneath the first metatarsal head.  After informed consent a 10 blade knife was used to debride the skin and soft tissue back to healthy viable granulation tissue the wound was 3 cm in diameter 1 mm deep after debridement.  There is no exposed bone or tendon.  Imaging: No results found. No images are attached to the encounter.  Labs: Lab Results  Component Value Date   HGBA1C 6.7 (A) 02/07/2021   HGBA1C 6.7 (A) 11/01/2020   HGBA1C 8.1 (H) 08/02/2020     Lab Results  Component Value Date   ALBUMIN 4.0 08/02/2020    Lab Results  Component Value Date   MG 2.6 (H) 04/04/2020   MG 2.6 (H) 04/04/2020   MG 2.9 (H) 04/03/2020   No results found for: "VD25OH"  No results found for: "PREALBUMIN"    Latest Ref Rng & Units 03/03/2021    8:50 AM 08/02/2020   11:46 AM 05/24/2020    6:13 PM  CBC EXTENDED  WBC  3.4 - 10.8 x10E3/uL  8.0  7.8   RBC 4.14 - 5.80 x10E6/uL  3.92  3.02   Hemoglobin 13.0 - 17.0 g/dL 12.2  11.4  8.7   HCT 39.0 - 52.0 % 36.0  34.9  27.8   Platelets 150 - 450 x10E3/uL  237  408      There is no height or weight on file to calculate BMI.  Orders:  Orders Placed This Encounter  Procedures   MR Foot Left w/o contrast   No orders of the defined types were placed in this encounter.    Procedures: No procedures performed  Clinical Data: No additional findings.  ROS:  All other systems negative, except as noted in the HPI. Review of Systems  Objective: Vital Signs: There were no vitals taken for this visit.  Specialty Comments:  No specialty comments available.  PMFS History: Patient Active Problem List   Diagnosis Date Noted   Trichomonas infection 07/31/2021   Erectile dysfunction 03/03/2021   HFrEF (heart failure with reduced ejection fraction) ([place]) 08/21/2020   Acute on chronic systolic and diastolic heart failure, NYHA class 3 ([place]) 05/25/2020   Pleural effusion    Gangrene of toe of right foot (  [place])    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   Coronary artery disease involving native coronary artery of native heart with unstable angina pectoris ([place])    Acute congestive heart failure ([place]) 03/27/2020   Diabetic foot ulcers ([place]) 03/27/2020   DM2 (diabetes mellitus, type 2) ([place]) 03/27/2020   HTN (hypertension) 03/27/2020   Past Medical History:  Diagnosis Date   Blind right eye    Carotid artery disease ([place])    04/01/20: 60-79% RICA stenosis, 123456 LICA stenosis   Chronic kidney disease    ckd stage 3 a per dr Malva Limes 02-05-2021 epic   Coronary artery disease    NSTEMI s/p CABG in 03/2020 (LIMA-->LAD/diag, L radial-->OM1/OM3); b.   DM type 2    Glaucoma of right eye, unspecified glaucoma type    HFrEF (heart failure with reduced ejection fraction) ([place])    Echo 6/22: EF 45, global HK, mild MR, RVSP 31.9   Hypertension    Ischemic cardiomyopathy     PAD (peripheral artery disease) (HCC)    occluded right axillary artery 03/29/20     Family History  Problem Relation Age of Onset   Diabetes Mother     Past Surgical History:  Procedure Laterality Date   AMPUTATION Right 04/24/2020   Procedure: RIGHT 2ND AND 3RD TOE AMPUTATION;  Surgeon: Newt Minion, MD;  Location: [place];  Service: Orthopedics;  Laterality: Right;   CENTRAL VENOUS CATHETER INSERTION Left 04/03/2020   Procedure: INSERTION OF FEMORAL ARTERIAL LINE;  Surgeon: Wonda Olds, MD;  Location: [place];  Service: Open Heart Surgery;  Laterality: Left;   CORONARY ARTERY BYPASS GRAFT N/A 04/03/2020   Procedure: CORONARY ARTERY BYPASS GRAFTING (CABG) TIMES FOUR USING LEFT INTERNAL MAMMARY ARTERY AND LEFT RADIAL ARTERY.;  Surgeon: Wonda Olds, MD;  Location: [place];  Service: Open Heart Surgery;  Laterality: N/A;   CORONARY PRESSURE WIRE/FFR WITH 3D MAPPING N/A 03/29/2020   Procedure: Coronary Pressure Wire/FFR w/3D Mapping;  Surgeon: Belva Crome, MD;  Location: [place] [facility];  Service: Cardiovascular;  Laterality: N/A;   EYE SURGERY Right 01/21/2021   right eye cataract lens replacement surgery   IR THORACENTESIS ASP PLEURAL SPACE W/IMG GUIDE  05/03/2020   IR THORACENTESIS ASP PLEURAL SPACE W/IMG GUIDE  05/16/2020   LEFT HEART CATH AND CORONARY ANGIOGRAPHY N/A 03/29/2020   Procedure: LEFT HEART CATH AND CORONARY ANGIOGRAPHY;  Surgeon: Belva Crome, MD;  Location: [place] [facility];  Service: Cardiovascular;  Laterality: N/A;   PENILE PROSTHESIS IMPLANT N/A 03/03/2021   Procedure: PENILE PROTHESIS INFLATABLE COLOPLAST;  Surgeon: Lucas Mallow, MD;  Location: [place];  Service: Urology;  Laterality: N/A;  REQUESTING 2 HRS   RADIAL ARTERY HARVEST Left 04/03/2020   Procedure: LEFT RADIAL ARTERY HARVEST;  Surgeon: Wonda Olds, MD;  Location: [facility];  Service: Open Heart Surgery;  Laterality: Left;   TEE WITHOUT CARDIOVERSION N/A 04/03/2020    Procedure: TRANSESOPHAGEAL ECHOCARDIOGRAM (TEE);  Surgeon: Wonda Olds, MD;  Location: [facility];  Service: Open Heart Surgery;  Laterality: N/A;   UPPER EXTREMITY ANGIOGRAPHY  03/29/2020   Procedure: Upper Extremity Angiography;  Surgeon: Belva Crome, MD;  Location: [place] [facility];  Service: Cardiovascular;;  rt.  arm   Social History   Occupational History   Occupation: retired d/t medical conditions  Tobacco Use   Smoking status: Former    Packs/day: 0.25    Years: 2.00    Total  pack years: 0.50    Types: Cigarettes    Quit date: 08/23/2020    Years since quitting: 0.9   Smokeless tobacco: Never  Vaping Use   Vaping Use: Never used  Substance and Sexual Activity   Alcohol use: Not Currently    Alcohol/week: 1.0 standard drink of alcohol    Types: 1 Cans of beer per week    Comment: occassionally beer   Drug use: Not Currently    Types: Marijuana    Comment: marijuana last use 3 xweek last used 02-23-2021   Sexual activity: Not Currently

## 2021-08-04 ENCOUNTER — Other Ambulatory Visit (HOSPITAL_COMMUNITY): Payer: Self-pay

## 2021-08-05 ENCOUNTER — Other Ambulatory Visit (HOSPITAL_COMMUNITY): Payer: Self-pay

## 2021-08-13 ENCOUNTER — Other Ambulatory Visit: Payer: Self-pay

## 2021-08-15 ENCOUNTER — Other Ambulatory Visit (HOSPITAL_COMMUNITY): Payer: Self-pay

## 2021-08-25 ENCOUNTER — Ambulatory Visit (INDEPENDENT_AMBULATORY_CARE_PROVIDER_SITE_OTHER): Payer: 59 | Admitting: Orthopedic Surgery

## 2021-08-25 ENCOUNTER — Encounter: Payer: Self-pay | Admitting: Orthopedic Surgery

## 2021-08-25 DIAGNOSIS — L97511 Non-pressure chronic ulcer of other part of right foot limited to breakdown of skin: Secondary | ICD-10-CM

## 2021-08-25 DIAGNOSIS — L97521 Non-pressure chronic ulcer of other part of left foot limited to breakdown of skin: Secondary | ICD-10-CM | POA: Diagnosis not present

## 2021-08-25 NOTE — Progress Notes (Signed)
Office Visit Note   Patient: Curtis Watkins           Date of Birth: [birth year]           MRN: [id number] Visit Date: 08/25/2021              Requested by: Rema Fendt, NP [address] [place],  [place] [address] PCP: Rema Fendt, NP  Chief Complaint  Patient presents with   Left Foot - Follow-up   Right Foot - Follow-up      HPI: Patient is a 66 year old gentleman who presents in follow-up for Wagner grade 1 ulcers beneath the first metatarsal head of both feet.  Assessment & Plan: Visit Diagnoses:  1. Non-pressure chronic ulcer of other part of left foot limited to breakdown of skin (HCC)   2. Non-pressure chronic ulcer of other part of right foot limited to breakdown of skin (HCC)     Plan: Ulcers were debrided patient will use a Dr. Margart Sickles pad to unload pressure.  Follow-Up Instructions: Return in about 4 weeks (around 09/22/2021).   Ortho Exam  Patient is alert, oriented, no adenopathy, well-dressed, normal affect, normal respiratory effort. Examination patient's feet are plantigrade he has good pulses.  He has a large Wagner grade 1 ulcer beneath the first metatarsal head both feet.  After informed consent a 10 blade knife was used debride the skin and soft tissue back to bleeding viable granulation tissue this was touched with silver nitrate a Band-Aid and gauze was applied.  The left great toe ulcer was 3 cm in diameter 3 mm deep after debridement the left first metatarsal head ulcer was 2 cm in diameter and 1 mm deep after debridement.  Imaging: No results found. No images are attached to the encounter.  Labs: Lab Results  Component Value Date   HGBA1C 6.7 (A) 02/07/2021   HGBA1C 6.7 (A) 11/01/2020   HGBA1C 8.1 (H) 08/02/2020     Lab Results  Component Value Date   ALBUMIN 4.0 08/02/2020    Lab Results  Component Value Date   MG 2.6 (H) 04/04/2020   MG 2.6 (H) 04/04/2020   MG 2.9 (H) 04/03/2020   No results found for:  "VD25OH"  No results found for: "PREALBUMIN"    Latest Ref Rng & Units 03/03/2021    8:50 AM 08/02/2020   11:46 AM 05/24/2020    6:13 PM  CBC EXTENDED  WBC 3.4 - 10.8 x10E3/uL  8.0  7.8   RBC 4.14 - 5.80 x10E6/uL  3.92  3.02   Hemoglobin 13.0 - 17.0 g/dL 19.1  47.8  8.7   HCT 29.5 - 52.0 % 36.0  34.9  27.8   Platelets 150 - 450 x10E3/uL  237  408      There is no height or weight on file to calculate BMI.  Orders:  No orders of the defined types were placed in this encounter.  No orders of the defined types were placed in this encounter.    Procedures: No procedures performed  Clinical Data: No additional findings.  ROS:  All other systems negative, except as noted in the HPI. Review of Systems  Objective: Vital Signs: There were no vitals taken for this visit.  Specialty Comments:  No specialty comments available.  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   Coronary artery disease involving native coronary artery of native heart with unstable angina pectoris (HCC)    Acute congestive heart failure (HCC) 03/27/2020   Diabetic foot ulcers (HCC) 03/27/2020   DM2 (diabetes mellitus, type 2) (HCC) 03/27/2020   HTN (hypertension) 03/27/2020   Past Medical History:  Diagnosis Date   Blind right eye    Carotid artery disease (HCC)    04/01/20: 60-79% RICA stenosis, 1-39% LICA stenosis   Chronic kidney disease    ckd stage 3 a per dr Ronald Lobo 02-05-2021 epic   Coronary artery disease    NSTEMI s/p CABG in 03/2020 (LIMA-->LAD/diag, L radial-->OM1/OM3); b.   DM type 2    Glaucoma of right eye, unspecified glaucoma type    HFrEF  (heart failure with reduced ejection fraction) (HCC)    Echo 6/22: EF 45, global HK, mild MR, RVSP 31.9   Hypertension    Ischemic cardiomyopathy    PAD (peripheral artery disease) (HCC)    occluded right axillary artery 03/29/20     Family History  Problem Relation Age of Onset   Diabetes Mother     Past Surgical History:  Procedure Laterality Date   AMPUTATION Right 04/24/2020   Procedure: RIGHT 2ND AND 3RD TOE AMPUTATION;  Surgeon: Nadara Mustard, MD;  [facility];  Service: Orthopedics;  Laterality: Right;   CENTRAL VENOUS CATHETER INSERTION Left 04/03/2020   Procedure: INSERTION OF FEMORAL ARTERIAL LINE;  Surgeon: Linden Dolin, MD;  [facility];  Service: Open Heart Surgery;  Laterality: Left;   CORONARY ARTERY BYPASS GRAFT N/A 04/03/2020   Procedure: CORONARY ARTERY BYPASS GRAFTING (CABG) TIMES FOUR USING LEFT INTERNAL MAMMARY ARTERY AND LEFT RADIAL ARTERY.;  Surgeon: Linden Dolin, MD;  [facility];  Service: Open Heart Surgery;  Laterality: N/A;   CORONARY PRESSURE WIRE/FFR WITH 3D MAPPING N/A 03/29/2020   Procedure: Coronary Pressure Wire/FFR w/3D Mapping;  Surgeon: Lyn Records, MD;  [facility];  Service: Cardiovascular;  Laterality: N/A;   EYE SURGERY Right 01/21/2021   right eye cataract lens replacement surgery   IR THORACENTESIS ASP PLEURAL SPACE W/IMG GUIDE  05/03/2020   IR THORACENTESIS ASP PLEURAL SPACE W/IMG GUIDE  05/16/2020   LEFT HEART CATH AND CORONARY ANGIOGRAPHY N/A 03/29/2020   Procedure: LEFT HEART CATH AND CORONARY ANGIOGRAPHY;  Surgeon: Lyn Records, MD;  [facility];  Service: Cardiovascular;  Laterality: N/A;   PENILE PROSTHESIS IMPLANT N/A 03/03/2021   Procedure: PENILE PROTHESIS INFLATABLE COLOPLAST;  Surgeon: Crista Elliot, MD;  Location[facility];  Service: Urology;  Laterality: N/A;  REQUESTING 2 HRS   RADIAL ARTERY HARVEST Left 04/03/2020   Procedure: LEFT RADIAL ARTERY  HARVEST;  Surgeon: Linden Dolin, MD;  [facility];  Service: Open Heart Surgery;  Laterality: Left;   TEE WITHOUT CARDIOVERSION N/A 04/03/2020   Procedure: TRANSESOPHAGEAL ECHOCARDIOGRAM (TEE);  Surgeon: Linden Dolin, MD;  [facility];  Service: Open Heart Surgery;  Laterality: N/A;   UPPER EXTREMITY ANGIOGRAPHY  03/29/2020   Procedure: Upper Extremity Angiography;  Surgeon: Lyn Records, MD;  [facility]: [facility] IN[facility];  Service: Cardiovascular;;  rt.  arm   Social History   Occupational History   Occupation:  retired d/t medical conditions  Tobacco Use   Smoking status: Former    Packs/day: 0.25    Years: 2.00    Total pack years: 0.50    Types: Cigarettes    Quit date: 08/23/2020    Years since quitting: 1.0   Smokeless tobacco: Never  Vaping Use   Vaping Use: Never used  Substance and Sexual Activity   Alcohol use: Not Currently    Alcohol/week: 1.0 standard drink of alcohol    Types: 1 Cans of beer per week    Comment: occassionally beer   Drug use: Not Currently    Types: Marijuana    Comment: marijuana last use 3 xweek last used 02-23-2021   Sexual activity: Not Currently

## 2021-09-05 ENCOUNTER — Other Ambulatory Visit (HOSPITAL_COMMUNITY): Payer: Self-pay

## 2021-09-05 ENCOUNTER — Other Ambulatory Visit: Payer: Self-pay | Admitting: Family

## 2021-09-05 DIAGNOSIS — E1100 Type 2 diabetes mellitus with hyperosmolarity without nonketotic hyperglycemic-hyperosmolar coma (NKHHC): Secondary | ICD-10-CM

## 2021-09-05 NOTE — Telephone Encounter (Signed)
Requested medication (s) are due for refill today: yes  Requested medication (s) are on the active medication list: yes  Last refill:  08/06/21 #30/0  Future visit scheduled: no  Notes to clinic:  Unable to refill per protocol due to failed labs, no updated results.      Requested Prescriptions  Pending Prescriptions Disp Refills   empagliflozin (JARDIANCE) 25 MG TABS tablet 30 tablet 0    Sig: Take 1 tablet (25 mg total) by mouth daily before breakfast.     Endocrinology:  Diabetes - SGLT2 Inhibitors Failed - 09/05/2021  1:08 PM      Failed - Cr in normal range and within 360 days    Creatinine, Ser  Date Value Ref Range Status  03/03/2021 1.90 (H) 0.61 - 1.24 mg/dL Final         Failed - HBA1C is between 0 and 7.9 and within 180 days    Hemoglobin A1C  Date Value Ref Range Status  02/07/2021 6.7 (A) 4.0 - 5.6 % Final   Hgb A1c MFr Bld  Date Value Ref Range Status  08/02/2020 8.1 (H) 4.8 - 5.6 % Final    Comment:             Prediabetes: 5.7 - 6.4          Diabetes: >6.4          Glycemic control for adults with diabetes: <7.0          Failed - eGFR in normal range and within 360 days    GFR, Estimated  Date Value Ref Range Status  06/19/2020 54 (L) >60 mL/min Final    Comment:    (NOTE) Calculated using the CKD-EPI Creatinine Equation (2021)    eGFR  Date Value Ref Range Status  08/02/2020 48 (L) >59 mL/min/1.73 Final         Passed - Valid encounter within last 6 months    Recent Outpatient Visits           1 month ago Screening for STD (sexually transmitted disease)   [facility] at [facility], Amy J, NP   10 months ago Type 2 diabetes mellitus with hyperosmolarity without coma, without long-term current use of insulin [facility])   [place], Annie Main L, RPH-CPP   11 months ago Type 2 diabetes mellitus with hyperosmolarity without coma, without long-term current use of insulin [facility])   [place], Jarome Matin, RPH-CPP   1 year ago Type 2 diabetes mellitus with hyperosmolarity without coma, without long-term current use of insulin [facility])   [place], RPH-CPP   1 year ago Annual physical exam   [facility] at [facility], Flonnie Hailstone, NP       Future Appointments             In 2 weeks Newt Minion, MD [facility]

## 2021-09-08 ENCOUNTER — Other Ambulatory Visit: Payer: Self-pay | Admitting: Family

## 2021-09-08 ENCOUNTER — Other Ambulatory Visit (HOSPITAL_COMMUNITY): Payer: Self-pay

## 2021-09-08 DIAGNOSIS — E1100 Type 2 diabetes mellitus with hyperosmolarity without nonketotic hyperglycemic-hyperosmolar coma (NKHHC): Secondary | ICD-10-CM

## 2021-09-08 NOTE — Telephone Encounter (Signed)
Patient canceled upcoming appointment on 09/17/2021 with Ronny Bacon, NP at Endocrinology. Make patient aware imperative to reschedule.

## 2021-09-09 ENCOUNTER — Other Ambulatory Visit: Payer: Self-pay

## 2021-09-17 ENCOUNTER — Ambulatory Visit: Payer: Self-pay | Admitting: Nurse Practitioner

## 2021-09-17 ENCOUNTER — Other Ambulatory Visit (HOSPITAL_COMMUNITY): Payer: Self-pay

## 2021-09-18 ENCOUNTER — Telehealth: Payer: Self-pay

## 2021-09-18 NOTE — Telephone Encounter (Signed)
Pt called to find out why his [place] Pepper was denied. Per office notes he canceled the endocrinologist appointment. Advised agent of the reason and to ask pt to make appt with endo.

## 2021-09-19 ENCOUNTER — Other Ambulatory Visit (HOSPITAL_COMMUNITY): Payer: Self-pay

## 2021-09-22 ENCOUNTER — Ambulatory Visit (INDEPENDENT_AMBULATORY_CARE_PROVIDER_SITE_OTHER): Payer: 59 | Admitting: Orthopedic Surgery

## 2021-09-22 ENCOUNTER — Other Ambulatory Visit: Payer: Self-pay

## 2021-09-22 DIAGNOSIS — B351 Tinea unguium: Secondary | ICD-10-CM

## 2021-09-22 DIAGNOSIS — L97521 Non-pressure chronic ulcer of other part of left foot limited to breakdown of skin: Secondary | ICD-10-CM | POA: Diagnosis not present

## 2021-09-22 DIAGNOSIS — L97511 Non-pressure chronic ulcer of other part of right foot limited to breakdown of skin: Secondary | ICD-10-CM

## 2021-09-22 DIAGNOSIS — E1100 Type 2 diabetes mellitus with hyperosmolarity without nonketotic hyperglycemic-hyperosmolar coma (NKHHC): Secondary | ICD-10-CM

## 2021-09-22 MED ORDER — EMPAGLIFLOZIN 25 MG PO TABS: 25.0000 mg | ORAL_TABLET | Freq: Every day | ORAL | 1 refills | Status: DC

## 2021-09-23 ENCOUNTER — Encounter: Payer: Self-pay | Admitting: Orthopedic Surgery

## 2021-09-23 NOTE — Progress Notes (Signed)
Office Visit Note   Patient: Curtis Watkins           Date of Birth: 1955-04-27           MRN: [id number] Visit Date: 09/22/2021              Requested by: Rema Fendt, NP [address] [place],  [place] [address] PCP: Rema Fendt, NP  Chief Complaint  Patient presents with   Right Foot - Wound Check   Left Foot - Wound Check      HPI: Patient is a 66 year old gentleman who presents in follow-up for Wagner grade 1 ulcers beneath the first metatarsal head bilaterally.  Patient has been using Dr. Margart Sickles pads to unload pressure.  Patient also complains of painful onychomycotic nails x7 that he cannot trim on his own.  Assessment & Plan: Visit Diagnoses:  1. Non-pressure chronic ulcer of other part of left foot limited to breakdown of skin (HCC)   2. Non-pressure chronic ulcer of other part of right foot limited to breakdown of skin (HCC)   3. Onychomycosis     Plan: Nails were trimmed x7 without complication ulcers debrided x2 no exposed bone or tendon.  Continue pressure offloading.  Follow-Up Instructions: Return in about 4 weeks (around 10/20/2021).   Ortho Exam  Patient is alert, oriented, no adenopathy, well-dressed, normal affect, normal respiratory effort. Examination patient has a Wagner grade 1 ulcer beneath the first metatarsal head both feet.  Patient also has thickened discolored onychomycotic nails and no evidence of a paronychial infection.  Nails were trimmed x7 without complication.  After informed consent a 10 blade knife was used to debride the skin and soft tissue from the [facility] grade 1 ulcers first metatarsal head both toes.  After debridement the ulcers were 3 cm in diameter 1 mm deep no exposed bone or tendon.  The ulcer did not probe to bone no undermining.  Imaging: No results found. No images are attached to the encounter.  Labs: Lab Results  Component Value Date   HGBA1C 6.7 (A) 02/07/2021   HGBA1C 6.7 (A) 11/01/2020   HGBA1C  8.1 (H) 08/02/2020     Lab Results  Component Value Date   ALBUMIN 4.0 08/02/2020    Lab Results  Component Value Date   MG 2.6 (H) 04/04/2020   MG 2.6 (H) 04/04/2020   MG 2.9 (H) 04/03/2020   No results found for: "VD25OH"  No results found for: "PREALBUMIN"    Latest Ref Rng & Units 03/03/2021    8:50 AM 08/02/2020   11:46 AM 05/24/2020    6:13 PM  CBC EXTENDED  WBC 3.4 - 10.8 x10E3/uL  8.0  7.8   RBC 4.14 - 5.80 x10E6/uL  3.92  3.02   Hemoglobin 13.0 - 17.0 g/dL 78.9  38.1  8.7   HCT 01.7 - 52.0 % 36.0  34.9  27.8   Platelets 150 - 450 x10E3/uL  237  408      There is no height or weight on file to calculate BMI.  Orders:  No orders of the defined types were placed in this encounter.  No orders of the defined types were placed in this encounter.    Procedures: No procedures performed  Clinical Data: No additional findings.  ROS:  All other systems negative, except as noted in the HPI. Review of Systems  Objective: Vital Signs: There were no vitals taken for this visit.  Specialty Comments:  No  specialty comments available.  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   Coronary artery disease involving native coronary artery of native heart with unstable angina pectoris (HCC)    Acute congestive heart failure (HCC) 03/27/2020   Diabetic foot ulcers (HCC) 03/27/2020   DM2 (diabetes mellitus, type 2) (HCC) 03/27/2020   HTN (hypertension) 03/27/2020   Past Medical History:  Diagnosis Date   Blind right eye    Carotid artery disease (HCC)    04/01/20: 60-79% RICA stenosis, 1-39% LICA stenosis   Chronic kidney disease     ckd stage 3 a per dr Ronald Lobo 02-05-2021 epic   Coronary artery disease    NSTEMI s/p CABG in 03/2020 (LIMA-->LAD/diag, L radial-->OM1/OM3); b.   DM type 2    Glaucoma of right eye, unspecified glaucoma type    HFrEF (heart failure with reduced ejection fraction) (HCC)    Echo 6/22: EF 45, global HK, mild MR, RVSP 31.9   Hypertension    Ischemic cardiomyopathy    PAD (peripheral artery disease) (HCC)    occluded right axillary artery 03/29/20     Family History  Problem Relation Age of Onset   Diabetes Mother     Past Surgical History:  Procedure Laterality Date   AMPUTATION Right 04/24/2020   Procedure: RIGHT 2ND AND 3RD TOE AMPUTATION;  Surgeon: Nadara Mustard, MD;  [facility];  Service: Orthopedics;  Laterality: Right;   CENTRAL VENOUS CATHETER INSERTION Left 04/03/2020   Procedure: INSERTION OF FEMORAL ARTERIAL LINE;  Surgeon: Linden Dolin, MD;  [facility];  Service: Open Heart Surgery;  Laterality: Left;   CORONARY ARTERY BYPASS GRAFT N/A 04/03/2020   Procedure: CORONARY ARTERY BYPASS GRAFTING (CABG) TIMES FOUR USING LEFT INTERNAL MAMMARY ARTERY AND LEFT RADIAL ARTERY.;  Surgeon: Linden Dolin, MD;  [facility];  Service: Open Heart Surgery;  Laterality: N/A;   CORONARY PRESSURE WIRE/FFR WITH 3D MAPPING N/A 03/29/2020   Procedure: Coronary Pressure Wire/FFR w/3D Mapping;  Surgeon: Lyn Records, MD;  [facility];  Service: Cardiovascular;  Laterality: N/A;   EYE SURGERY Right 01/21/2021   right eye cataract lens replacement surgery   IR THORACENTESIS ASP PLEURAL SPACE W/IMG GUIDE  05/03/2020   IR THORACENTESIS ASP PLEURAL SPACE W/IMG GUIDE  05/16/2020   LEFT HEART CATH AND CORONARY ANGIOGRAPHY N/A 03/29/2020   Procedure: LEFT HEART CATH AND CORONARY ANGIOGRAPHY;  Surgeon: Lyn Records, MD;  [facility];  Service: Cardiovascular;  Laterality: N/A;   PENILE PROSTHESIS IMPLANT N/A 03/03/2021   Procedure: PENILE  PROTHESIS INFLATABLE COLOPLAST;  Surgeon: Crista Elliot, MD;  Location: [facility] [place];  Service: Urology;  Laterality: N/A;  REQUESTING 2 HRS   RADIAL ARTERY HARVEST Left 04/03/2020   Procedure: LEFT RADIAL ARTERY HARVEST;  Surgeon: Linden Dolin, MD;  [facility];  Service: Open Heart Surgery;  Laterality: Left;   TEE WITHOUT CARDIOVERSION N/A 04/03/2020   Procedure: TRANSESOPHAGEAL ECHOCARDIOGRAM (TEE);  Surgeon: Linden Dolin, MD;  Location: [facility] OR;  Service: Open Heart Surgery;  Laterality: N/A;   UPPER EXTREMITY ANGIOGRAPHY  03/29/2020   Procedure: Upper Extremity Angiography;  Surgeon: Belva Crome, MD;  Location: [place] [facility];  Service: Cardiovascular;;  rt.  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

## 2021-09-25 ENCOUNTER — Other Ambulatory Visit: Payer: Self-pay

## 2021-09-26 ENCOUNTER — Other Ambulatory Visit (HOSPITAL_COMMUNITY): Payer: Self-pay

## 2021-09-26 ENCOUNTER — Other Ambulatory Visit: Payer: Self-pay

## 2021-09-29 ENCOUNTER — Other Ambulatory Visit: Payer: Self-pay

## 2021-09-29 ENCOUNTER — Other Ambulatory Visit: Payer: Self-pay | Admitting: *Deleted

## 2021-09-29 ENCOUNTER — Other Ambulatory Visit (HOSPITAL_COMMUNITY): Payer: Self-pay

## 2021-09-29 MED ORDER — LOSARTAN POTASSIUM 25 MG PO TABS
12.5000 mg | ORAL_TABLET | Freq: Every day | ORAL | 1 refills | Status: DC
  Filled 2021-09-29: qty 45, 90d supply, fill #0
  Filled 2021-12-16: qty 45, 90d supply, fill #1

## 2021-10-07 ENCOUNTER — Other Ambulatory Visit: Payer: Self-pay

## 2021-10-09 ENCOUNTER — Other Ambulatory Visit: Payer: Self-pay

## 2021-10-09 MED ORDER — OZEMPIC (0.25 OR 0.5 MG/DOSE) 2 MG/3ML ~~LOC~~ SOPN: 0.5000 mg | PEN_INJECTOR | SUBCUTANEOUS | 0 refills | Status: DC

## 2021-10-13 ENCOUNTER — Other Ambulatory Visit: Payer: Self-pay

## 2021-10-13 ENCOUNTER — Other Ambulatory Visit (HOSPITAL_COMMUNITY): Payer: Self-pay

## 2021-10-20 ENCOUNTER — Ambulatory Visit (INDEPENDENT_AMBULATORY_CARE_PROVIDER_SITE_OTHER): Payer: 59 | Admitting: Orthopedic Surgery

## 2021-10-20 ENCOUNTER — Encounter: Payer: Self-pay | Admitting: Orthopedic Surgery

## 2021-10-20 DIAGNOSIS — M869 Osteomyelitis, unspecified: Secondary | ICD-10-CM

## 2021-10-20 DIAGNOSIS — L97521 Non-pressure chronic ulcer of other part of left foot limited to breakdown of skin: Secondary | ICD-10-CM

## 2021-10-20 NOTE — Progress Notes (Signed)
Office Visit Note   Patient: Curtis Watkins           Date of Birth: 06-16-1955           MRN: [id number] Visit Date: 10/20/2021              Requested by: Rema Fendt, NP [address] [place],  [place] [address] PCP: Rema Fendt, NP  Chief Complaint  Patient presents with   Right Foot - Wound Check   Left Foot - Wound Check      HPI: Patient is a 66 year old gentleman who presents in follow-up for bilateral great toe MTP ulcers.  Patient states he was using a pad to protect the right foot and has noticed increased ulceration and drainage of the right great toe.  Assessment & Plan: Visit Diagnoses:  1. Osteomyelitis of great toe of right foot (HCC)   2. Non-pressure chronic ulcer of other part of left foot limited to breakdown of skin (HCC)     Plan: With the necrotic exposed first metatarsal head right foot discussed with the patient we could proceed with a ray amputation versus transmetatarsal amputation.  Patient only has the fourth and fifth toes remaining and I feel he would do better with a transmetatarsal amputation with spacer and carbon plate for ambulation postoperatively.  Risks and benefits were discussed including wound healing.  Patient states he understands wished to proceed at this time.  Follow-Up Instructions: Return in about 1 week (around 10/27/2021).   Ortho Exam  Patient is alert, oriented, no adenopathy, well-dressed, normal affect, normal respiratory effort. Examination of both feet he has a strong dorsalis pedis pulse that is palpable.  Patient has purulent drainage with necrotic first metatarsal head right foot.  The foot has no ascending cellulitis and is status post second and third ray amputations.  Examination of the left foot he has a plantar Wagner grade 1 ulcer beneath the first metatarsal head left foot.  After informed consent a 10 blade knife was used to debride the skin and soft tissue back to healthy viable tissue.  The ulcer  was 2 cm in diameter after debridement the ulcer did not probe to bone or tendon.  Silver nitrate was used for hemostasis.  Imaging: No results found. No images are attached to the encounter.  Labs: Lab Results  Component Value Date   HGBA1C 6.7 (A) 02/07/2021   HGBA1C 6.7 (A) 11/01/2020   HGBA1C 8.1 (H) 08/02/2020     Lab Results  Component Value Date   ALBUMIN 4.0 08/02/2020    Lab Results  Component Value Date   MG 2.6 (H) 04/04/2020   MG 2.6 (H) 04/04/2020   MG 2.9 (H) 04/03/2020   No results found for: "VD25OH"  No results found for: "PREALBUMIN"    Latest Ref Rng & Units 03/03/2021    8:50 AM 08/02/2020   11:46 AM 05/24/2020    6:13 PM  CBC EXTENDED  WBC 3.4 - 10.8 x10E3/uL  8.0  7.8   RBC 4.14 - 5.80 x10E6/uL  3.92  3.02   Hemoglobin 13.0 - 17.0 g/dL 62.1  30.8  8.7   HCT 65.7 - 52.0 % 36.0  34.9  27.8   Platelets 150 - 450 x10E3/uL  237  408      There is no height or weight on file to calculate BMI.  Orders:  No orders of the defined types were placed in this encounter.  No orders of  the defined types were placed in this encounter.    Procedures: No procedures performed  Clinical Data: No additional findings.  ROS:  All other systems negative, except as noted in the HPI. Review of Systems  Objective: Vital Signs: There were no vitals taken for this visit.  Specialty Comments:  No specialty comments available.  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   Coronary artery disease involving native coronary artery of native heart with unstable angina pectoris (HCC)    Acute congestive heart failure (HCC)  03/27/2020   Diabetic foot ulcers (HCC) 03/27/2020   DM2 (diabetes mellitus, type 2) (HCC) 03/27/2020   HTN (hypertension) 03/27/2020   Past Medical History:  Diagnosis Date   Blind right eye    Carotid artery disease (HCC)    04/01/20: 60-79% RICA stenosis, 1-39% LICA stenosis   Chronic kidney disease    ckd stage 3 a per dr Ronald Lobo 02-05-2021 epic   Coronary artery disease    NSTEMI s/p CABG in 03/2020 (LIMA-->LAD/diag, L radial-->OM1/OM3); b.   DM type 2    Glaucoma of right eye, unspecified glaucoma type    HFrEF (heart failure with reduced ejection fraction) (HCC)    Echo 6/22: EF 45, global HK, mild MR, RVSP 31.9   Hypertension    Ischemic cardiomyopathy    PAD (peripheral artery disease) (HCC)    occluded right axillary artery 03/29/20     Family History  Problem Relation Age of Onset   Diabetes Mother     Past Surgical History:  Procedure Laterality Date   AMPUTATION Right 04/24/2020   Procedure: RIGHT 2ND AND 3RD TOE AMPUTATION;  Surgeon: Nadara Mustard, MD;  Location: [facility];  Service: Orthopedics;  Laterality: Right;   CENTRAL VENOUS CATHETER INSERTION Left 04/03/2020   Procedure: INSERTION OF FEMORAL ARTERIAL LINE;  Surgeon: Linden Dolin, MD;  Location: [facility];  Service: Open Heart Surgery;  Laterality: Left;   CORONARY ARTERY BYPASS GRAFT N/A 04/03/2020   Procedure: CORONARY ARTERY BYPASS GRAFTING (CABG) TIMES FOUR USING LEFT INTERNAL MAMMARY ARTERY AND LEFT RADIAL ARTERY.;  Surgeon: Linden Dolin, MD;  Location: [facility];  Service: Open Heart Surgery;  Laterality: N/A;   CORONARY PRESSURE WIRE/FFR WITH 3D MAPPING N/A 03/29/2020   Procedure: Coronary Pressure Wire/FFR w/3D Mapping;  Surgeon: Lyn Records, MD;  Location: [facility];  Service: Cardiovascular;  Laterality: N/A;   EYE SURGERY Right 01/21/2021   right eye cataract lens replacement surgery   IR THORACENTESIS ASP PLEURAL SPACE W/IMG GUIDE  05/03/2020   IR THORACENTESIS ASP PLEURAL SPACE  W/IMG GUIDE  05/16/2020   LEFT HEART CATH AND CORONARY ANGIOGRAPHY N/A 03/29/2020   Procedure: LEFT HEART CATH AND CORONARY ANGIOGRAPHY;  Surgeon: Lyn Records, MD;  Location: [facility];  Service: Cardiovascular;  Laterality: N/A;   PENILE PROSTHESIS IMPLANT N/A 03/03/2021   Procedure: PENILE PROTHESIS INFLATABLE COLOPLAST;  Surgeon: Crista Elliot, MD;  Location: [facility];  Service: Urology;  Laterality: N/A;  REQUESTING 2 HRS   RADIAL ARTERY HARVEST Left 04/03/2020   Procedure: LEFT RADIAL ARTERY HARVEST;  Surgeon: Wonda Olds, MD;  Location: [facility];  Service: Open Heart Surgery;  Laterality: Left;   TEE WITHOUT CARDIOVERSION N/A 04/03/2020   Procedure: TRANSESOPHAGEAL ECHOCARDIOGRAM (TEE);  Surgeon: Wonda Olds, MD;  Location: [facility];  Service: Open Heart Surgery;  Laterality: N/A;   UPPER EXTREMITY ANGIOGRAPHY  03/29/2020   Procedure: Upper Extremity Angiography;  Surgeon: Belva Crome, MD;  Location: [facility];  Service: Cardiovascular;;  rt.  arm   Social History   Occupational History   Occupation: retired d/t medical conditions  Tobacco Use   Smoking status: Former    Packs/day: 0.25    Years: 2.00    Total pack years: 0.50    Types: Cigarettes    Quit date: 08/23/2020    Years since quitting: 1.1   Smokeless tobacco: Never  Vaping Use   Vaping Use: Never used  Substance and Sexual Activity   Alcohol use: Not Currently    Alcohol/week: 1.0 standard drink of alcohol    Types: 1 Cans of beer per week    Comment: occassionally beer   Drug use: Not Currently    Types: Marijuana    Comment: marijuana last use 3 xweek last used 02-23-2021   Sexual activity: Not Currently

## 2021-10-21 ENCOUNTER — Encounter (HOSPITAL_COMMUNITY): Payer: Self-pay | Admitting: Orthopedic Surgery

## 2021-10-21 ENCOUNTER — Other Ambulatory Visit: Payer: Self-pay

## 2021-10-21 NOTE — Progress Notes (Addendum)
Spoke with pt for pre-op call. Pt has hx of CAD with CABG February 2022. Pt denies any recent chest pain or shortness of breath. Pt was on Aspirin and Plavix after the CABG and had a surgery in December 2022 and Dr. Devin Going office had him hold the Plavix for 5 days prior to the surgery but to resume as soon after surgery as allowed. Plavix is not on his med list and I asked him when did he stop it and who instructed him to stop taking it. He states that he did not start it back after the surgery in December 2022 because Dr. Shari Prows told him not to restart it after surgery. Pt still takes an Aspirin daily, supposed to be on 81 mg but he ran out and only could find the 325 mg one.   Pt is a type 2 Diabetic. Last A1C was 6.7 on 02/07/21. Pt states his fasting blood sugar is usually around 130. Instructed pt to hold his Jardiance as of now until after surgery. Instructed pt to check his blood sugar when he wakes up in the AM and every 2 hours until he leaves for the hospital. If blood sugar is 70 or below, treat with 1/2 cup of clear juice (apple or cranberry) and recheck blood sugar 15 minutes after drinking juice. If blood sugar continues to be 70 or below, call the Short Stay department and ask to speak to a nurse. Pt voiced understanding.   Shower instructions given to pt and he voiced understanding.   Chart sent to Anesthesia PA

## 2021-10-21 NOTE — Anesthesia Preprocedure Evaluation (Signed)
Anesthesia Evaluation  Patient identified by MRN, date of birth, ID band Patient awake    Reviewed: Allergy & Precautions, NPO status , Patient's Chart, lab work & pertinent test results, reviewed documented beta blocker date and time   Airway Mallampati: II  TM Distance: >3 FB Neck ROM: Full    Dental  (+) Dental Advisory Given   Pulmonary Current Smoker  Pulmonary exam normal  breath sounds clear to auscultation       Cardiovascular hypertension, Pt. on home beta blockers and Pt. on medications + angina  + CAD, + Peripheral Vascular Disease and +CHF  Normal cardiovascular exam Rhythm:Regular Rate:Normal  Echo 07/2020 1. Left ventricular ejection fraction, by estimation, is 45%. The left ventricle has mildly decreased function. The left ventricle demonstrates global hypokinesis. Left ventricular diastolic function could not be evaluated.  2. The mitral valve is grossly normal. Mild mitral valve regurgitation. No evidence of mitral stenosis.  3. The aortic valve is tricuspid. Aortic valve regurgitation is not visualized.  4. There is normal pulmonary artery systolic pressure.    Neuro/Psych negative neurological ROS     GI/Hepatic negative GI ROS, Neg liver ROS,,,  Endo/Other  diabetes    Renal/GU Renal disease     Musculoskeletal negative musculoskeletal ROS (+)    Abdominal   Peds  Hematology negative hematology ROS (+)   Anesthesia Other Findings   Reproductive/Obstetrics                                                             Anesthesia Evaluation  Patient identified by MRN, date of birth, ID band Patient awake    Reviewed: Allergy & Precautions, NPO status , Patient's Chart, lab work & pertinent test results, reviewed documented beta blocker date and time   Airway Mallampati: II  TM Distance: >3 FB Neck ROM: Full    Dental  (+) Edentulous Upper, Dental Advisory  Given   Pulmonary Current Smoker, former smoker,  Post-CABG CXR showed moderate pleural effusions, persistent but increased in size since 04/06/20. Note is not yet completed, but appears he is scheduled for thoracentesis with IR on 05/03/20. O2 sat 93%.   Pulmonary exam normal breath sounds clear to auscultation       Cardiovascular hypertension, Pt. on medications and Pt. on home beta blockers + angina + CAD, + CABG (CABG x 4 04/03/20), + Peripheral Vascular Disease and +CHF (LVEF nml post-CABG (40-45% pre CABG), grade 2 diastolic dysfunction)  Normal cardiovascular exam+ Valvular Problems/Murmurs (mild MR) MR  Rhythm:Regular Rate:Normal  Echo 07/2020  1. Left ventricular ejection fraction, by estimation, is 45%. The left ventricle has mildly decreased function. The left ventricle demonstrates global hypokinesis. Left ventricular diastolic function could not be evaluated.   2. The mitral valve is grossly normal. Mild mitral valve regurgitation. No evidence of mitral stenosis.   3. The aortic valve is tricuspid. Aortic valve regurgitation is not visualized.   4. There is normal pulmonary artery systolic pressure.   Limited echo 05/2020  1. Left ventricular ejection fraction, by estimation, is 25 to 30%. The left ventricle has severely decreased function. The left ventricle demonstrates global hypokinesis. The left ventricular internal cavity size was mildly dilated.   2. A small pericardial effusion is present. The pericardial effusion is localized near the  right atrium. There is no evidence of cardiac tamponade.     Echo 03/28/20 (pre-CABG): 1. Left ventricular ejection fraction, by estimation, is 40 to 45%. The left ventricle has mildly decreased function. There is mild left ventricular hypertrophy. Left ventricular diastolic parameters are consistent with Grade II diastolic dysfunction (pseudonormalization). Elevated left atrial pressure.   2. Right ventricular systolic function is  normal. The right ventricular size is normal.   3. Left atrial size was mildly dilated.   4. Mild mitral valve regurgitation.   5. The inferior vena cava is dilated in size with <50% respiratory variability, suggesting right atrial pressure of 15 mmHg.   Post-CABG TEE: POST-OP IMPRESSIONS  - Left Ventricle: The cavity size was normal. The wall motion is normal.  - Aorta: The aorta appears unchanged from pre-bypass.  - Left Atrial Appendage: The left atrial appendage appears unchanged from pre-bypass.  - Aortic Valve: The aortic valve appears unchanged from pre-bypass.  - Mitral Valve: There is mild regurgitation.  - Tricuspid Valve: The tricuspid valve appears unchanged from pre-bypass.  - Interatrial Septum: The interatrial septum appears unchanged from pre-bypass.    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   Abdominal   Peds  Hematology negative hematology ROS (+)   Anesthesia Other Findings   Reproductive/Obstetrics negative OB ROS                            Anesthesia Physical  Anesthesia Plan  ASA: 3  Anesthesia Plan: General   Post-op Pain Management: Minimal or no pain anticipated and Tylenol PO (pre-op)   Induction: Intravenous  PONV Risk Score and Plan: 3 and Midazolam, Dexamethasone, Ondansetron and Treatment may vary due to age or medical condition  Airway Management Planned: Oral ETT and LMA  Additional Equipment: None  Intra-op Plan:   Post-operative Plan: Extubation in OR  Informed Consent: I have reviewed the patients History and Physical, chart, labs and discussed the procedure including the risks, benefits and alternatives for the proposed anesthesia with the patient or authorized  representative who has indicated his/her understanding and acceptance.     Dental advisory given  Plan Discussed with: CRNA  Anesthesia Plan Comments:        Anesthesia Quick Evaluation  Anesthesia Physical Anesthesia Plan  ASA: 3  Anesthesia Plan: Regional   Minimal or no pain anticipatedPost-op Pain Management:    Induction: Intravenous  PONV Risk Score and Plan: 4 or greater and Ondansetron, Treatment may vary due to age or medical condition, Midazolam, Propofol infusion and TIVA  Airway Management Planned: Natural Airway  Additional Equipment:   Intra-op Plan:   Post-operative Plan:   Informed Consent: I have reviewed the patients History and Physical, chart, labs and discussed the procedure including the risks, benefits and alternatives for the proposed anesthesia with the patient or authorized representative who has indicated his/her understanding and acceptance.     Dental advisory given  Plan Discussed with: CRNA  Anesthesia Plan Comments: (PAT note by Antionette Poles, PA-C:  Follows cardiology for history of CAD s/p CABG x 29 March 2020, systolic heart failure/ICM with improved EF, HTN, carotid disease (RICA60-79%/LICA 1-39% noted on pre-CABG dopplers on 03/2020) , PAD.  He  was last seen by Diona Browner, NP on 02/06/2019.  Stable at that time with no anginal symptoms.  He was also cleared at that time to undergo urologic surgery.  Per note, "According to the Revised Cardiac Risk Index (RCRI),hisPerioperative Risk of Major Cardiac Event is (%): 6.6.HisFunctional Capacity in METs is: 8.97according to the Duke Activity Status Index (DASI). Therefore, based on ACC/AHA guidelines, patient would be at acceptable risk for the planned procedure without further cardiovascular testing."   He was recommended follow-up in 6 months, however, he has not yet done so. He did undergo penile prosthesis implant [id number] without complication.   History of CKD  3.  Non-insulin-dependent DM2, last A1c in epic 6.7 on 02/07/2021.  Patient will need day of surgery labs and evaluation.  EKG 02/05/2021: NSR.  Rate 70.  Minimal voltage criteria for LVH, may be normal variant.  T wave abnormality, consider inferior ischemia.  TTE 08/20/2020: 1. Left ventricular ejection fraction, by estimation, is 45%. The left  ventricle has mildly decreased function. The left ventricle demonstrates  global hypokinesis. Left ventricular diastolic function could not be  evaluated.  2. The mitral valve is grossly normal. Mild mitral valve regurgitation.  No evidence of mitral stenosis.  3. The aortic valve is tricuspid. Aortic valve regurgitation is not  visualized.  4. There is normal pulmonary artery systolic pressure.   Comparison(s): A prior study was performed on 05/26/20. Prior images  reviewed side by side. LV function has improved.   )       Anesthesia Quick Evaluation

## 2021-10-21 NOTE — Progress Notes (Signed)
Anesthesia Chart Review: Same-day work-up  Follows cardiology for history of CAD s/p CABG x 29 March 2020, systolic heart failure/ICM with improved EF, HTN, carotid disease (RICA 60-79%/LICA 1-39% noted on pre-CABG dopplers on 03/2020) , PAD.  He was last seen by Bernadene Person, NP on 02/06/2019.  Stable at that time with no anginal symptoms.  He was also cleared at that time to undergo urologic surgery.  Per note, "According to the Revised Cardiac Risk Index (RCRI), his Perioperative Risk of Major Cardiac Event is (%): 6.6. His Functional Capacity in METs is: 8.97 according to the Duke Activity Status Index (DASI). Therefore, based on ACC/AHA guidelines, patient would be at acceptable risk for the planned procedure without further cardiovascular testing."   He was recommended follow-up in 6 months, however, he has not yet done so. He did undergo penile prosthesis implant 03/03/21 without complication.   History of CKD 3.  Non-insulin-dependent DM2, last A1c in epic 6.7 on 02/07/2021.  Patient will need day of surgery labs and evaluation.  EKG 02/05/2021: NSR.  Rate 70.  Minimal voltage criteria for LVH, may be normal variant.  T wave abnormality, consider inferior ischemia.  TTE 08/20/2020:  1. Left ventricular ejection fraction, by estimation, is 45%. The left  ventricle has mildly decreased function. The left ventricle demonstrates  global hypokinesis. Left ventricular diastolic function could not be  evaluated.   2. The mitral valve is grossly normal. Mild mitral valve regurgitation.  No evidence of mitral stenosis.   3. The aortic valve is tricuspid. Aortic valve regurgitation is not  visualized.   4. There is normal pulmonary artery systolic pressure.   Comparison(s): A prior study was performed on 05/26/20. Prior images  reviewed side by side. LV function has improved.     Zannie Cove [facility] Short Stay Center/Anesthesiology Phone [phone] 10/21/2021 4:20 PM

## 2021-10-22 ENCOUNTER — Encounter (HOSPITAL_COMMUNITY): Payer: Self-pay | Admitting: Orthopedic Surgery

## 2021-10-22 ENCOUNTER — Ambulatory Visit (HOSPITAL_COMMUNITY): Payer: 59 | Admitting: Physician Assistant

## 2021-10-22 ENCOUNTER — Other Ambulatory Visit: Payer: Self-pay

## 2021-10-22 ENCOUNTER — Encounter (HOSPITAL_COMMUNITY)
Admission: RE | Disposition: A | Discharge status: Home / Self Care | Payer: Self-pay | Source: Home / Self Care | Attending: Orthopedic Surgery

## 2021-10-22 ENCOUNTER — Ambulatory Visit (HOSPITAL_BASED_OUTPATIENT_CLINIC_OR_DEPARTMENT_OTHER): Payer: 59 | Admitting: Physician Assistant

## 2021-10-22 ENCOUNTER — Ambulatory Visit (HOSPITAL_COMMUNITY)
Admission: RE | Admit: 2021-10-22 | Discharge: 2021-10-22 | Disposition: A | Discharge status: Home / Self Care | Payer: 59 | Attending: Orthopedic Surgery | Admitting: Orthopedic Surgery

## 2021-10-22 DIAGNOSIS — F17210 Nicotine dependence, cigarettes, uncomplicated: Secondary | ICD-10-CM | POA: Insufficient documentation

## 2021-10-22 DIAGNOSIS — I110 Hypertensive heart disease with heart failure: Secondary | ICD-10-CM | POA: Diagnosis not present

## 2021-10-22 DIAGNOSIS — I2510 Atherosclerotic heart disease of native coronary artery without angina pectoris: Secondary | ICD-10-CM | POA: Insufficient documentation

## 2021-10-22 DIAGNOSIS — E1122 Type 2 diabetes mellitus with diabetic chronic kidney disease: Secondary | ICD-10-CM | POA: Insufficient documentation

## 2021-10-22 DIAGNOSIS — M869 Osteomyelitis, unspecified: Secondary | ICD-10-CM | POA: Insufficient documentation

## 2021-10-22 DIAGNOSIS — I5020 Unspecified systolic (congestive) heart failure: Secondary | ICD-10-CM

## 2021-10-22 DIAGNOSIS — E11621 Type 2 diabetes mellitus with foot ulcer: Secondary | ICD-10-CM | POA: Insufficient documentation

## 2021-10-22 DIAGNOSIS — I130 Hypertensive heart and chronic kidney disease with heart failure and stage 1 through stage 4 chronic kidney disease, or unspecified chronic kidney disease: Secondary | ICD-10-CM | POA: Insufficient documentation

## 2021-10-22 DIAGNOSIS — Z833 Family history of diabetes mellitus: Secondary | ICD-10-CM | POA: Diagnosis not present

## 2021-10-22 DIAGNOSIS — I5022 Chronic systolic (congestive) heart failure: Secondary | ICD-10-CM | POA: Insufficient documentation

## 2021-10-22 DIAGNOSIS — E1169 Type 2 diabetes mellitus with other specified complication: Secondary | ICD-10-CM | POA: Diagnosis present

## 2021-10-22 DIAGNOSIS — Z951 Presence of aortocoronary bypass graft: Secondary | ICD-10-CM | POA: Insufficient documentation

## 2021-10-22 DIAGNOSIS — I25119 Atherosclerotic heart disease of native coronary artery with unspecified angina pectoris: Secondary | ICD-10-CM

## 2021-10-22 DIAGNOSIS — L97514 Non-pressure chronic ulcer of other part of right foot with necrosis of bone: Secondary | ICD-10-CM | POA: Diagnosis not present

## 2021-10-22 DIAGNOSIS — I255 Ischemic cardiomyopathy: Secondary | ICD-10-CM | POA: Insufficient documentation

## 2021-10-22 DIAGNOSIS — L97521 Non-pressure chronic ulcer of other part of left foot limited to breakdown of skin: Secondary | ICD-10-CM | POA: Insufficient documentation

## 2021-10-22 DIAGNOSIS — N1831 Chronic kidney disease, stage 3a: Secondary | ICD-10-CM | POA: Diagnosis not present

## 2021-10-22 DIAGNOSIS — E1151 Type 2 diabetes mellitus with diabetic peripheral angiopathy without gangrene: Secondary | ICD-10-CM | POA: Diagnosis not present

## 2021-10-22 HISTORY — PX: AMPUTATION: SHX166

## 2021-10-22 HISTORY — PX: APPLICATION OF WOUND VAC: SHX5189

## 2021-10-22 LAB — BASIC METABOLIC PANEL
Anion gap: 5 (ref 5–15)
BUN: 28 mg/dL — ABNORMAL HIGH (ref 8–23)
CO2: 23 mmol/L (ref 22–32)
Calcium: 8.5 mg/dL — ABNORMAL LOW (ref 8.9–10.3)
Chloride: 111 mmol/L (ref 98–111)
Creatinine, Ser: 1.36 mg/dL — ABNORMAL HIGH (ref 0.61–1.24)
GFR, Estimated: 58 mL/min — ABNORMAL LOW (ref >60)
Glucose, Bld: 120 mg/dL — ABNORMAL HIGH (ref 70–99)
Potassium: 4.6 mmol/L (ref 3.5–5.1)
Sodium: 139 mmol/L (ref 135–145)

## 2021-10-22 LAB — CBC
HCT: 32.3 % — ABNORMAL LOW (ref 39.0–52.0)
Hemoglobin: 10.6 g/dL — ABNORMAL LOW (ref 13.0–17.0)
MCH: 31.2 pg (ref 26.0–34.0)
MCHC: 32.8 g/dL (ref 30.0–36.0)
MCV: 95 fL (ref 80.0–100.0)
Platelets: 307 10*3/uL (ref 150–400)
RBC: 3.4 MIL/uL — ABNORMAL LOW (ref 4.22–5.81)
RDW: 13.8 % (ref 11.5–15.5)
WBC: 8 10*3/uL (ref 4.0–10.5)
nRBC: 0 % (ref 0.0–0.2)

## 2021-10-22 LAB — GLUCOSE, CAPILLARY
Glucose-Capillary: 112 mg/dL — ABNORMAL HIGH (ref 70–99)
Glucose-Capillary: 106 mg/dL — ABNORMAL HIGH (ref 70–99)
Glucose-Capillary: 122 mg/dL — ABNORMAL HIGH (ref 70–99)

## 2021-10-22 SURGERY — AMPUTATION, FOOT, RAY
Anesthesia: Regional | Site: Foot | Laterality: Right

## 2021-10-22 MED ORDER — FENTANYL CITRATE (PF) 100 MCG/2ML IJ SOLN: 50.0000 ug | Freq: Once | INTRAMUSCULAR | Status: DC

## 2021-10-22 MED ORDER — FENTANYL CITRATE (PF) 100 MCG/2ML IJ SOLN
INTRAMUSCULAR | Status: AC
  Administered 2021-10-22: 50 ug
  Filled 2021-10-22: qty 2

## 2021-10-22 MED ORDER — CHLORHEXIDINE GLUCONATE 0.12 % MT SOLN
15.0000 mL | OROMUCOSAL | Status: AC
  Administered 2021-10-22: 15 mL via OROMUCOSAL
  Filled 2021-10-22: qty 15

## 2021-10-22 MED ORDER — LACTATED RINGERS IV SOLN: INTRAVENOUS | Status: DC

## 2021-10-22 MED ORDER — MIDAZOLAM HCL 2 MG/2ML IJ SOLN
INTRAMUSCULAR | Status: AC
  Filled 2021-10-22: qty 2

## 2021-10-22 MED ORDER — DEXAMETHASONE SODIUM PHOSPHATE 4 MG/ML IJ SOLN
INTRAMUSCULAR | Status: DC | PRN
  Administered 2021-10-22: 5 mg via PERINEURAL

## 2021-10-22 MED ORDER — CLONIDINE HCL (ANALGESIA) 100 MCG/ML EP SOLN
EPIDURAL | Status: DC | PRN
  Administered 2021-10-22: 60 ug

## 2021-10-22 MED ORDER — ROPIVACAINE HCL 7.5 MG/ML IJ SOLN
INTRAMUSCULAR | Status: DC | PRN
  Administered 2021-10-22: 15 mL via PERINEURAL

## 2021-10-22 MED ORDER — HYDROCODONE-ACETAMINOPHEN 5-325 MG PO TABS: 1.0000 | ORAL_TABLET | ORAL | 0 refills | Status: DC | PRN

## 2021-10-22 MED ORDER — INSULIN ASPART 100 UNIT/ML IJ SOLN: 0.0000 [IU] | INTRAMUSCULAR | Status: DC | PRN

## 2021-10-22 MED ORDER — CEFAZOLIN SODIUM-DEXTROSE 2-4 GM/100ML-% IV SOLN
2.0000 g | INTRAVENOUS | Status: AC
  Administered 2021-10-22: 2 g via INTRAVENOUS
  Filled 2021-10-22: qty 100

## 2021-10-22 MED ORDER — 0.9 % SODIUM CHLORIDE (POUR BTL) OPTIME
TOPICAL | Status: DC | PRN
  Administered 2021-10-22: 1000 mL

## 2021-10-22 MED ORDER — MIDAZOLAM HCL 2 MG/2ML IJ SOLN
INTRAMUSCULAR | Status: DC | PRN
  Administered 2021-10-22: 1 mg via INTRAVENOUS

## 2021-10-22 MED ORDER — ROPIVACAINE HCL 5 MG/ML IJ SOLN
INTRAMUSCULAR | Status: DC | PRN
  Administered 2021-10-22: 15 mL via PERINEURAL

## 2021-10-22 SURGICAL SUPPLY — 35 items
BAG COUNTER SPONGE SURGICOUNT (BAG) ×2 IMPLANT
BLADE SAW SGTL MED 73X18.5 STR (BLADE) IMPLANT
BLADE SURG 21 STRL SS (BLADE) ×2 IMPLANT
BNDG COHESIVE 4X5 TAN STRL (GAUZE/BANDAGES/DRESSINGS) ×2 IMPLANT
BNDG COHESIVE 6X5 TAN ST LF (GAUZE/BANDAGES/DRESSINGS) IMPLANT
BNDG GAUZE DERMACEA FLUFF 4 (GAUZE/BANDAGES/DRESSINGS) ×2 IMPLANT
COVER SURGICAL LIGHT HANDLE (MISCELLANEOUS) ×4 IMPLANT
DRAPE DERMATAC (DRAPES) IMPLANT
DRAPE U-SHAPE 47X51 STRL (DRAPES) ×4 IMPLANT
DRESSING PEEL AND PLC PRVNA 13 (GAUZE/BANDAGES/DRESSINGS) IMPLANT
DRSG ADAPTIC 3X8 NADH LF (GAUZE/BANDAGES/DRESSINGS) ×2 IMPLANT
DRSG PAD ABDOMINAL 8X10 ST (GAUZE/BANDAGES/DRESSINGS) ×4 IMPLANT
DRSG PEEL AND PLACE PREVENA 13 (GAUZE/BANDAGES/DRESSINGS) ×2
DURAPREP 26ML APPLICATOR (WOUND CARE) ×2 IMPLANT
ELECT REM PT RETURN 9FT ADLT (ELECTROSURGICAL) ×2
ELECTRODE REM PT RTRN 9FT ADLT (ELECTROSURGICAL) ×2 IMPLANT
GAUZE SPONGE 4X4 12PLY STRL (GAUZE/BANDAGES/DRESSINGS) ×2 IMPLANT
GLOVE BIOGEL PI IND STRL 9 (GLOVE) ×2 IMPLANT
GLOVE BIOGEL PI INDICATOR 9 (GLOVE) ×2
GLOVE SURG ORTHO 9.0 STRL STRW (GLOVE) ×2 IMPLANT
GOWN STRL REUS W/ TWL XL LVL3 (GOWN DISPOSABLE) ×4 IMPLANT
GOWN STRL REUS W/TWL XL LVL3 (GOWN DISPOSABLE) ×4
GRAFT SKIN WND MICRO 38 (Tissue) IMPLANT
KIT BASIN OR (CUSTOM PROCEDURE TRAY) ×2 IMPLANT
KIT PREVENA INCISION MGT 13 (CANNISTER) IMPLANT
KIT PUMP PREVENA PLUS 14DAY (MISCELLANEOUS) IMPLANT
KIT TURNOVER KIT B (KITS) ×2 IMPLANT
NS IRRIG 1000ML POUR BTL (IV SOLUTION) ×2 IMPLANT
PACK ORTHO EXTREMITY (CUSTOM PROCEDURE TRAY) ×2 IMPLANT
PAD ARMBOARD 7.5X6 YLW CONV (MISCELLANEOUS) ×4 IMPLANT
STOCKINETTE IMPERVIOUS LG (DRAPES) IMPLANT
SUT ETHILON 2 0 PSLX (SUTURE) ×2 IMPLANT
TOWEL GREEN STERILE (TOWEL DISPOSABLE) ×2 IMPLANT
TUBE CONNECTING 12X1/4 (SUCTIONS) ×2 IMPLANT
YANKAUER SUCT BULB TIP NO VENT (SUCTIONS) ×2 IMPLANT

## 2021-10-22 NOTE — Anesthesia Procedure Notes (Addendum)
Anesthesia Regional Block: Popliteal block   Pre-Anesthetic Checklist: , timeout performed,  Correct Patient, Correct Site, Correct Laterality,  Correct Procedure, Correct Position, site marked,  Risks and benefits discussed,  Surgical consent,  Pre-op evaluation,  At surgeon's request and post-op pain management  Laterality: Lower and Right  Prep: chloraprep       Needles:  Injection technique: Single-shot  Needle Type: Stimiplex     Needle Length: 10cm  Needle Gauge: 21     Additional Needles:   Procedures:,,,, ultrasound used (permanent image in chart),,   Motor weakness within 5 minutes.  Narrative:  Start time: 10/22/2021 8:21 AM End time: 10/22/2021 8:41 AM Injection made incrementally with aspirations every 5 mL.  Performed by: Personally  Anesthesiologist: Lewie Loron, MD  Additional Notes: Nerve located and needle positioned with direct ultrasound guidance. Good perineural spread. Patient tolerated well.

## 2021-10-22 NOTE — Op Note (Addendum)
10/22/2021  11:23 AM  PATIENT:  Curtis Watkins    PRE-OPERATIVE DIAGNOSIS:  Osteomyelitis Right Foot  POST-OPERATIVE DIAGNOSIS:  Same  PROCEDURE:  RIGHT TRANSMETATARSAL AMPUTATION, APPLICATION OF WOUND VAC Application of Kerecis micro powder 38 cm2  SURGEON:  Nadara Mustard, MD  PHYSICIAN ASSISTANT:None ANESTHESIA:   General  PREOPERATIVE INDICATIONS:  Curtis Watkins is a  66 y.o. male with a diagnosis of Osteomyelitis Right Foot who failed conservative measures and elected for surgical management.    The risks benefits and alternatives were discussed with the patient preoperatively including but not limited to the risks of infection, bleeding, nerve injury, cardiopulmonary complications, the need for revision surgery, among others, and the patient was willing to proceed.  OPERATIVE IMPLANTS: Kerecis micro powder 38 cm.  @ENCIMAGES @  OPERATIVE FINDINGS: Good petechial bleeding calcified arteries  OPERATIVE PROCEDURE: Patient brought the operating room after undergoing a general anesthetic.  After adequate levels anesthesia obtained patient's right lower extremity was prepped using DuraPrep draped into a sterile field a timeout was called.  A fishmouth incision was made proximal to the ulcerative tissue.  This was carried down and an oscillating saw was used to resect through the bone.  Bone was beveled plantarly.  Electrocautery was used for hemostasis the amputation was completed with a fishmouth incision.  All remaining wound margins were healthy and viable.  The wound was irrigated with normal saline.  Kerecis micro powder was applied to the wound bed this was closed with 2-0 nylon.  A 13 cm Prevena wound VAC was applied this had a good suction fit patient was extubated taken the PACU in stable condition.   DISCHARGE PLANNING:  Antibiotic duration: Preoperative antibiotics  Weightbearing: Touchdown weightbearing on the right  Pain medication: Prescription for Vicodin [facility]   Dressing care/ Wound VAC: Wound VAC for 1 week  Ambulatory devices: Walker or crutches  Discharge to: Home.  Follow-up: In the office 1 week post operative.

## 2021-10-22 NOTE — H&P (Signed)
JOANNE BRANDER is an 66 y.o. male.   Chief Complaint: Painful ulceration right great toe MTP joint. HPI: Patient is a 66 year old gentleman who presents in follow-up for bilateral great toe MTP ulcers.  Patient states he was using a pad to protect the right foot and has noticed increased ulceration and drainage of the right great toe.  Past Medical History:  Diagnosis Date   Blind right eye    Carotid artery disease (HCC)    04/01/20: 60-79% RICA stenosis, 1-39% LICA stenosis   Chronic kidney disease    ckd stage 3 a per dr Ronald Lobo 02-05-2021 epic   Coronary artery disease    NSTEMI s/p CABG in 03/2020 (LIMA-->LAD/diag, L radial-->OM1/OM3); b.   DM type 2    Glaucoma of right eye, unspecified glaucoma type    HFrEF (heart failure with reduced ejection fraction) (HCC)    Echo 6/22: EF 45, global HK, mild MR, RVSP 31.9   Hypertension    Ischemic cardiomyopathy    PAD (peripheral artery disease) (HCC)    occluded right axillary artery 03/29/20     Past Surgical History:  Procedure Laterality Date   AMPUTATION Right 04/24/2020   Procedure: RIGHT 2ND AND 3RD TOE AMPUTATION;  Surgeon: Nadara Mustard, MD;  [facility];  Service: Orthopedics;  Laterality: Right;   CENTRAL VENOUS CATHETER INSERTION Left 04/03/2020   Procedure: INSERTION OF FEMORAL ARTERIAL LINE;  Surgeon: Linden Dolin, MD;  [facility];  Service: Open Heart Surgery;  Laterality: Left;   CORONARY ARTERY BYPASS GRAFT N/A 04/03/2020   Procedure: CORONARY ARTERY BYPASS GRAFTING (CABG) TIMES FOUR USING LEFT INTERNAL MAMMARY ARTERY AND LEFT RADIAL ARTERY.;  Surgeon: Linden Dolin, MD;  [facility];  Service: Open Heart Surgery;  Laterality: N/A;   CORONARY PRESSURE WIRE/FFR WITH 3D MAPPING N/A 03/29/2020   Procedure: Coronary Pressure Wire/FFR w/3D Mapping;  Surgeon: Lyn Records, MD;  [facility];  Service: Cardiovascular;  Laterality: N/A;   EYE SURGERY Right 01/21/2021   right eye cataract  lens replacement surgery   IR THORACENTESIS ASP PLEURAL SPACE W/IMG GUIDE  05/03/2020   IR THORACENTESIS ASP PLEURAL SPACE W/IMG GUIDE  05/16/2020   LEFT HEART CATH AND CORONARY ANGIOGRAPHY N/A 03/29/2020   Procedure: LEFT HEART CATH AND CORONARY ANGIOGRAPHY;  Surgeon: Lyn Records, MD;  [facility];  Service: Cardiovascular;  Laterality: N/A;   PENILE PROSTHESIS IMPLANT N/A 03/03/2021   Procedure: PENILE PROTHESIS INFLATABLE COLOPLAST;  Surgeon: Crista Elliot, MD;  Location: [facility];  Service: Urology;  Laterality: N/A;  REQUESTING 2 HRS   RADIAL ARTERY HARVEST Left 04/03/2020   Procedure: LEFT RADIAL ARTERY HARVEST;  Surgeon: Linden Dolin, MD;  [facility];  Service: Open Heart Surgery;  Laterality: Left;   TEE WITHOUT CARDIOVERSION N/A 04/03/2020   Procedure: TRANSESOPHAGEAL ECHOCARDIOGRAM (TEE);  Surgeon: Linden Dolin, MD;  Location: [facility] OR;  Service: Open Heart Surgery;  Laterality: N/A;   UPPER EXTREMITY ANGIOGRAPHY  03/29/2020   Procedure: Upper Extremity Angiography;  Surgeon: Lyn Records, MD;  Location: [facility] INVASIVE CV LAB;  Service: Cardiovascular;;  rt.  arm    Family History  Problem Relation Age of Onset   Diabetes Mother    Social History:  reports that he has been smoking cigarettes. He has a 0.50 pack-year smoking history. He has never used smokeless tobacco. He reports that he does not currently use alcohol after  a past usage of about 1.0 standard drink of alcohol per week. He reports that he does not currently use drugs after having used the following drugs: Marijuana.  Allergies: No Known Allergies  No medications prior to admission.    No results found for this or any previous visit (from the past 48 hour(s)). No results found.  Review of Systems  All other systems reviewed and are negative.   There were no vitals taken for this visit. Physical Exam  Patient is alert, oriented, no adenopathy, well-dressed,  normal affect, normal respiratory effort. Examination of both feet he has a strong dorsalis pedis pulse that is palpable.  Patient has purulent drainage with necrotic first metatarsal head right foot.  The foot has no ascending cellulitis and is status post second and third ray amputations.  Assessment/Plan 1. Osteomyelitis of great toe of right foot (HCC)   2. Non-pressure chronic ulcer of other part of left foot limited to breakdown of skin (HCC)       Plan: With the necrotic exposed first metatarsal head right foot discussed with the patient we could proceed with a ray amputation versus transmetatarsal amputation.  Patient only has the fourth and fifth toes remaining and I feel he would do better with a transmetatarsal amputation with spacer and carbon plate for ambulation postoperatively.  Risks and benefits were discussed including wound healing.  Patient states he understands wished to proceed at this time.  Nadara Mustard, MD 10/22/2021, 6:40 AM

## 2021-10-22 NOTE — Progress Notes (Signed)
Orthopedic Tech Progress Note Patient Details:  Curtis Watkins March 08, 1955 [id number]  PACU RN called requesting a POST OP SHOE   Ortho Devices Type of Ortho Device: Postop shoe/boot Ortho Device/Splint Location: RLE Ortho Device/Splint Interventions: Ordered, Application, Adjustment   Post Interventions Patient Tolerated: Well Instructions Provided: Care of device  [name] 10/22/2021, 1:20 PM

## 2021-10-22 NOTE — Transfer of Care (Signed)
Immediate Anesthesia Transfer of Care Note  Patient: Curtis Watkins  Procedure(s) Performed: RIGHT TRANSMETATARSAL AMPUTATION (Right) APPLICATION OF WOUND VAC (Right: Foot)  Patient Location: PACU  Anesthesia Type:MAC  Level of Consciousness: drowsy  Airway & Oxygen Therapy: Patient Spontanous Breathing and Patient connected to nasal cannula oxygen  Post-op Assessment: Report given to RN and Post -op Vital signs reviewed and stable  Post vital signs: Reviewed and stable  Last Vitals:  Vitals Value Taken Time  BP 132/82 10/22/21 1132  Temp    Pulse 64 10/22/21 1134  Resp 10 10/22/21 1134  SpO2 100 % 10/22/21 1134  Vitals shown include unvalidated device data.  Last Pain:  Vitals:   10/22/21 0845  PainSc: 0-No pain      Patients Stated Pain Goal: 0 (09/81/19 1478)  Complications: No notable events documented.

## 2021-10-22 NOTE — Anesthesia Procedure Notes (Signed)
Procedure Name: MAC Date/Time: 10/22/2021 10:43 AM  Performed by: Imagene Riches, CRNAPre-anesthesia Checklist: Patient identified, Emergency Drugs available, Suction available, Patient being monitored and Timeout performed Patient Re-evaluated:Patient Re-evaluated prior to induction Oxygen Delivery Method: Nasal cannula

## 2021-10-23 ENCOUNTER — Encounter (HOSPITAL_COMMUNITY): Payer: Self-pay | Admitting: Orthopedic Surgery

## 2021-10-23 NOTE — Anesthesia Postprocedure Evaluation (Signed)
Anesthesia Post Note  Patient: Curtis Watkins  Procedure(s) Performed: RIGHT TRANSMETATARSAL AMPUTATION (Right) APPLICATION OF WOUND VAC (Right: Foot)     Patient location during evaluation: PACU Anesthesia Type: Regional Level of consciousness: awake and alert Pain management: pain level controlled Vital Signs Assessment: post-procedure vital signs reviewed and stable Respiratory status: spontaneous breathing Cardiovascular status: stable Anesthetic complications: no   No notable events documented.  Last Vitals:  Vitals:   10/22/21 1133 10/22/21 1200  BP: 132/82 127/80  Pulse: 64 63  Resp:  10  Temp:    SpO2: 100% 100%    Last Pain:  Vitals:   10/22/21 1200  PainSc: 0-No pain                 Lewie Loron

## 2021-10-29 ENCOUNTER — Encounter: Payer: Self-pay | Admitting: Family

## 2021-10-29 ENCOUNTER — Ambulatory Visit (INDEPENDENT_AMBULATORY_CARE_PROVIDER_SITE_OTHER): Payer: 59 | Admitting: Family

## 2021-10-29 DIAGNOSIS — Z89431 Acquired absence of right foot: Secondary | ICD-10-CM

## 2021-10-29 NOTE — Progress Notes (Signed)
Post-Op Visit Note   Patient: Curtis Watkins           Date of Birth: [birth year]           MRN: [id number] Visit Date: 10/29/2021 PCP: Rema Fendt, NP  Chief Complaint:  Chief Complaint  Patient presents with   Right Foot - Routine Post Op    10/22/21 right transmet amputation    HPI:  HPI The patient is a 66 year old gentleman seen status post right transmetatarsal amputation.  Wound VAC removed today. Ortho Exam On examination of the right foot his incision is well approximated sutures there is no active drainage however there is maceration.  Thin friable skin.  Visit Diagnoses: No diagnosis found.  Plan: Begin daily Dial soap cleansing.  Dry dressings.  Discussed the importance of nonweightbearing and elevation.  Follow-Up Instructions: No follow-ups on file.   Imaging: No results found.  Orders:  No orders of the defined types were placed in this encounter.  No orders of the defined types were placed in this encounter.    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   Coronary artery disease involving native coronary artery of native heart with unstable angina pectoris (HCC)    Acute congestive heart failure (HCC) 03/27/2020   Diabetic foot ulcers (HCC) 03/27/2020   DM2 (diabetes mellitus, type 2) (HCC) 03/27/2020   HTN (hypertension) 03/27/2020   Past Medical History:  Diagnosis Date   Blind right eye    Carotid artery disease (HCC)    04/01/20: 60-79% RICA stenosis, 1-39% LICA stenosis   Chronic kidney disease    ckd stage 3 a per dr Ronald Lobo 02-05-2021 epic   Coronary artery disease    NSTEMI s/p CABG in 03/2020  (LIMA-->LAD/diag, L radial-->OM1/OM3); b.   DM type 2    Glaucoma of right eye, unspecified glaucoma type    HFrEF (heart failure with reduced ejection fraction) (HCC)    Echo 6/22: EF 45, global HK, mild MR, RVSP 31.9   Hypertension    Ischemic cardiomyopathy    PAD (peripheral artery disease) (HCC)    occluded right axillary artery 03/29/20     Family History  Problem Relation Age of Onset   Diabetes Mother     Past Surgical History:  Procedure Laterality Date   AMPUTATION Right 04/24/2020   Procedure: RIGHT 2ND AND 3RD TOE AMPUTATION;  Surgeon: Nadara Mustard, MD;  [facility];  Service: Orthopedics;  Laterality: Right;   AMPUTATION Right 10/22/2021   Procedure: RIGHT TRANSMETATARSAL AMPUTATION;  Surgeon: Nadara Mustard, MD;  Location[facility] OR;  Service: Orthopedics;  Laterality: Right;   APPLICATION OF WOUND VAC Right 10/22/2021   Procedure: APPLICATION OF WOUND VAC;  Surgeon: Nadara Mustard, MD;  [facility];  Service: Orthopedics;  Laterality: Right;   CENTRAL VENOUS CATHETER INSERTION Left 04/03/2020   Procedure: INSERTION OF FEMORAL ARTERIAL LINE;  Surgeon: Linden Dolin, MD;  [facility];  Service: Open Heart Surgery;  Laterality: Left;   CORONARY ARTERY BYPASS GRAFT N/A 04/03/2020   Procedure: CORONARY ARTERY BYPASS GRAFTING (CABG) TIMES FOUR USING LEFT INTERNAL  MAMMARY ARTERY AND LEFT RADIAL ARTERY.;  Surgeon: Linden Dolin, MD;  [facility];  Service: Open Heart Surgery;  Laterality: N/A;   CORONARY PRESSURE WIRE/FFR WITH 3D MAPPING N/A 03/29/2020   Procedure: Coronary Pressure Wire/FFR w/3D Mapping;  Surgeon: Lyn Records, MD;  [facility];  Service: Cardiovascular;  Laterality: N/A;   EYE SURGERY Right 01/21/2021   right eye cataract lens replacement surgery   IR THORACENTESIS ASP PLEURAL SPACE W/IMG GUIDE  05/03/2020   IR THORACENTESIS ASP PLEURAL SPACE W/IMG GUIDE  05/16/2020   LEFT HEART CATH AND CORONARY ANGIOGRAPHY N/A 03/29/2020    Procedure: LEFT HEART CATH AND CORONARY ANGIOGRAPHY;  Surgeon: Lyn Records, MD;  [facility];  Service: Cardiovascular;  Laterality: N/A;   PENILE PROSTHESIS IMPLANT N/A 03/03/2021   Procedure: PENILE PROTHESIS INFLATABLE COLOPLAST;  Surgeon: Crista Elliot, MD;  Location: [facility];  Service: Urology;  Laterality: N/A;  REQUESTING 2 HRS   RADIAL ARTERY HARVEST Left 04/03/2020   Procedure: LEFT RADIAL ARTERY HARVEST;  Surgeon: Linden Dolin, MD;  [facility];  Service: Open Heart Surgery;  Laterality: Left;   TEE WITHOUT CARDIOVERSION N/A 04/03/2020   Procedure: TRANSESOPHAGEAL ECHOCARDIOGRAM (TEE);  Surgeon: Linden Dolin, MD;  Location: [facility] OR;  Service: Open Heart Surgery;  Laterality: N/A;   UPPER EXTREMITY ANGIOGRAPHY  03/29/2020   Procedure: Upper Extremity Angiography;  Surgeon: Lyn Records, MD;  Location: [facility] INVASIVE CV LAB;  Service: Cardiovascular;;  rt.  arm   Social History   Occupational History   Occupation: retired d/t medical conditions  Tobacco Use   Smoking status: Every Day    Packs/day: 0.25    Years: 2.00    Total pack years: 0.50    Types: Cigarettes    Last attempt to quit: 08/23/2020    Years since quitting: 1.1   Smokeless tobacco: Never  Vaping Use   Vaping Use: Never used  Substance and Sexual Activity   Alcohol use: Not Currently    Alcohol/week: 1.0 standard drink of alcohol    Types: 1 Cans of beer per week    Comment: occassionally beer   Drug use: Not Currently    Types: Marijuana    Comment: marijuana last use 3 xweek last used 02-23-2021   Sexual activity: Not Currently

## 2021-11-12 ENCOUNTER — Ambulatory Visit (INDEPENDENT_AMBULATORY_CARE_PROVIDER_SITE_OTHER): Payer: 59 | Admitting: Family

## 2021-11-12 ENCOUNTER — Encounter: Payer: Self-pay | Admitting: Family

## 2021-11-12 DIAGNOSIS — B351 Tinea unguium: Secondary | ICD-10-CM

## 2021-11-12 DIAGNOSIS — Z89431 Acquired absence of right foot: Secondary | ICD-10-CM | POA: Diagnosis not present

## 2021-11-12 DIAGNOSIS — L97521 Non-pressure chronic ulcer of other part of left foot limited to breakdown of skin: Secondary | ICD-10-CM | POA: Diagnosis not present

## 2021-11-12 NOTE — Progress Notes (Signed)
Post-Op Visit Note   Patient: Curtis Watkins           Date of Birth: 1955/09/05           MRN: [id number] Visit Date: 11/12/2021 PCP: Camillia Herter, NP  Chief Complaint:  Chief Complaint  Patient presents with   Right Foot - Routine Post Op    10/22/21 right transmet amputation    HPI:  HPI The patient is a 66 year old gentleman who is seen status post right transmetatarsal amputation he has been nonweightbearing and elevating for swelling doing daily Dial soap cleansing and dry dressings.  Requesting a nail trim on the left Ortho Exam On examination of the left foot there are thickened and discolored onychomycotic nails he also has a Wagner grade 1 ulcer beneath the first metatarsal head this was debrided of nonviable tissue back to viable tissue there is underlying 15 mm in diameter ulceration with 3 mm of depth there is no active drainage or erythema or warmth On examination of the right foot the transmetatarsal amputation is healing well the medial half is well-healed medial sutures harvested  Visit Diagnoses: No diagnosis found.  Plan: Continue daily Dial soap cleansing.  Minimize weightbearing on the right.  Follow-up in the office in 2 weeks for remaining suture harvesting.  Follow-Up Instructions: No follow-ups on file.   Imaging: No results found.  Orders:  No orders of the defined types were placed in this encounter.  No orders of the defined types were placed in this encounter.    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   Coronary artery disease involving native coronary artery of native heart with unstable angina pectoris  ([place])    Acute congestive heart failure ([place]) 03/27/2020   Diabetic foot ulcers ([place]) 03/27/2020   DM2 (diabetes mellitus, type 2) ([place]) 03/27/2020   HTN (hypertension) 03/27/2020   Past Medical History:  Diagnosis Date   Blind right eye    Carotid artery disease ([place])    04/01/20: 60-79% RICA stenosis, 4-09% LICA stenosis   Chronic kidney disease    ckd stage 3 a per dr Malva Limes 02-05-2021 epic   Coronary artery disease    NSTEMI s/p CABG in 03/2020 (LIMA-->LAD/diag, L radial-->OM1/OM3); b.   DM type 2    Glaucoma of right eye, unspecified glaucoma type    HFrEF (heart failure with reduced ejection fraction) ([place])    Echo 6/22: EF 45, global HK, mild MR, RVSP 31.9   Hypertension    Ischemic cardiomyopathy    PAD (peripheral artery disease) (HCC)    occluded right axillary artery 03/29/20     Family History  Problem Relation Age of Onset   Diabetes Mother     Past Surgical History:  Procedure Laterality Date   AMPUTATION Right 04/24/2020   Procedure: RIGHT 2ND AND 3RD TOE AMPUTATION;  Surgeon: Newt Minion, MD;  Location: [place];  Service: Orthopedics;  Laterality: Right;   AMPUTATION Right 10/22/2021   Procedure: RIGHT TRANSMETATARSAL AMPUTATION;  Surgeon: Newt Minion, MD;  Location: [place];  Service: Orthopedics;  Laterality: Right;   APPLICATION OF WOUND  VAC Right 10/22/2021   Procedure: APPLICATION OF WOUND VAC;  Surgeon: Newt Minion, MD;  Location: [place];  Service: Orthopedics;  Laterality: Right;   CENTRAL VENOUS CATHETER INSERTION Left 04/03/2020   Procedure: INSERTION OF FEMORAL ARTERIAL LINE;  Surgeon: Wonda Olds, MD;  Location: [place];  Service: Open Heart Surgery;  Laterality: Left;   CORONARY ARTERY BYPASS GRAFT N/A 04/03/2020   Procedure: CORONARY ARTERY BYPASS GRAFTING (CABG) TIMES FOUR USING LEFT INTERNAL MAMMARY ARTERY AND LEFT RADIAL ARTERY.;  Surgeon: Wonda Olds, MD;  Location: [place];  Service: Open Heart Surgery;  Laterality: N/A;   CORONARY  PRESSURE WIRE/FFR WITH 3D MAPPING N/A 03/29/2020   Procedure: Coronary Pressure Wire/FFR w/3D Mapping;  Surgeon: Belva Crome, MD;  Location: [place] CV LAB;  Service: Cardiovascular;  Laterality: N/A;   EYE SURGERY Right 01/21/2021   right eye cataract lens replacement surgery   IR THORACENTESIS ASP PLEURAL SPACE W/IMG GUIDE  05/03/2020   IR THORACENTESIS ASP PLEURAL SPACE W/IMG GUIDE  05/16/2020   LEFT HEART CATH AND CORONARY ANGIOGRAPHY N/A 03/29/2020   Procedure: LEFT HEART CATH AND CORONARY ANGIOGRAPHY;  Surgeon: Belva Crome, MD;  Location: [place] CV LAB;  Service: Cardiovascular;  Laterality: N/A;   PENILE PROSTHESIS IMPLANT N/A 03/03/2021   Procedure: PENILE PROTHESIS INFLATABLE COLOPLAST;  Surgeon: Lucas Mallow, MD;  Location: [place];  Service: Urology;  Laterality: N/A;  REQUESTING 2 HRS   RADIAL ARTERY HARVEST Left 04/03/2020   Procedure: LEFT RADIAL ARTERY HARVEST;  Surgeon: Wonda Olds, MD;  Location: [place];  Service: Open Heart Surgery;  Laterality: Left;   TEE WITHOUT CARDIOVERSION N/A 04/03/2020   Procedure: TRANSESOPHAGEAL ECHOCARDIOGRAM (TEE);  Surgeon: Wonda Olds, MD;  Location: [place];  Service: Open Heart Surgery;  Laterality: N/A;   UPPER EXTREMITY ANGIOGRAPHY  03/29/2020   Procedure: Upper Extremity Angiography;  Surgeon: Belva Crome, MD;  Location: [place] CV LAB;  Service: Cardiovascular;;  rt.  arm   Social History   Occupational History   Occupation: retired d/t medical conditions  Tobacco Use   Smoking status: Every Day    Packs/day: 0.25    Years: 2.00    Total pack years: 0.50    Types: Cigarettes    Last attempt to quit: 08/23/2020    Years since quitting: 1.2   Smokeless tobacco: Never  Vaping Use   Vaping Use: Never used  Substance and Sexual Activity   Alcohol use: Not Currently    Alcohol/week: 1.0 standard drink of alcohol    Types: 1 Cans of beer per week    Comment: occassionally beer    Drug use: Not Currently    Types: Marijuana    Comment: marijuana last use 3 xweek last used 02-23-2021   Sexual activity: Not Currently

## 2021-11-17 ENCOUNTER — Other Ambulatory Visit (HOSPITAL_COMMUNITY): Payer: Self-pay

## 2021-11-17 ENCOUNTER — Other Ambulatory Visit: Payer: Self-pay | Admitting: Internal Medicine

## 2021-11-17 ENCOUNTER — Other Ambulatory Visit: Payer: Self-pay

## 2021-11-17 ENCOUNTER — Other Ambulatory Visit: Payer: Self-pay | Admitting: *Deleted

## 2021-11-17 MED ORDER — METOPROLOL SUCCINATE ER 25 MG PO TB24: 37.5000 mg | ORAL_TABLET | Freq: Every day | ORAL | 0 refills | Status: DC

## 2021-11-17 MED ORDER — FUROSEMIDE 20 MG PO TABS
20.0000 mg | ORAL_TABLET | Freq: Every day | ORAL | 0 refills | Status: DC
  Filled 2021-11-17: qty 30, 30d supply, fill #0

## 2021-11-19 NOTE — Progress Notes (Signed)
Patient ID: Curtis Watkins, male    DOB: Oct 19, 1955  MRN: [id number]  CC: Chronic Care Management   Subjective: Curtis Watkins is a 66 y.o. male who presents for chronic care management.   His concerns today include:  - Requesting referral to diabetes doctor. He was established with Renato Shin, MD. States he needs to get diabetes doctor to complete a form about his feet. Taking Jardiance and Ozempic, doing well no issues or concerns. Has diabetic eye exam scheduled 12/01/2021. - Needs referral to heart doctor. States hasn't been in a while.  - Need referral to kidney doctor.  - States he doesn't need any medication refills today. He is aware to notify me should he need medication refills prior to appointment with specialists.   Patient Active Problem List   Diagnosis Date Noted   Trichomonas infection 07/31/2021   Erectile dysfunction 03/03/2021   HFrEF (heart failure with reduced ejection fraction) ([place]) 08/21/2020   Acute on chronic systolic and diastolic heart failure, NYHA class 3 ([place]) 05/25/2020   Pleural effusion    Gangrene of toe of right foot ([place])    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   Coronary artery disease involving native coronary artery of native heart with unstable angina pectoris ([place])    Acute congestive heart failure ([place]) 03/27/2020   Diabetic foot ulcers ([place]) 03/27/2020   DM2 (diabetes mellitus, type 2) ([place]) 03/27/2020   HTN (hypertension) 03/27/2020     Current Outpatient Medications on File Prior to Visit  Medication Sig Dispense Refill   aspirin 81 MG tablet Take 1 tablet (81 mg total) by mouth daily. (Patient not taking: Reported on 10/20/2021)     aspirin EC 325 MG tablet Take 325 mg by mouth daily.     atorvastatin (LIPITOR) 80 MG tablet Take 1 tablet (80 mg total) by mouth daily. 90 tablet 3   blood glucose meter kit and supplies by Other route as directed. Dispense based on patient and insurance preference. Use up to four times daily as directed. (FOR ICD-10  E10.9, E11.9).     brimonidine (ALPHAGAN) 0.2 % ophthalmic solution Place 1 drop into the left eye 2 (two) times daily. (Patient not taking: Reported on 10/20/2021) 15 mL 11   dorzolamide-timolol (COSOPT) 22.3-6.8 MG/ML ophthalmic solution Place 1 drop into the left eye 2 (two) times daily. 10 mL 11   empagliflozin (JARDIANCE) 25 MG TABS tablet Take 1 tablet (25 mg total) by mouth daily before breakfast. 90 tablet 1   furosemide (LASIX) 20 MG tablet Take 1 tablet (20 mg total) by mouth daily. 30 tablet 0   HYDROcodone-acetaminophen (NORCO/VICODIN) 5-325 MG tablet Take 1 tablet by mouth every 4 (four) hours as needed. 30 tablet 0   losartan (COZAAR) 25 MG tablet Take 0.5 tablets (12.5 mg total) by mouth daily. 45 tablet 1   metoprolol succinate (TOPROL XL) 25 MG 24 hr tablet Take 1.5 tablets (37.5 mg total) by mouth daily. 135 tablet 0   Semaglutide,0.25 or 0.5MG/DOS, (OZEMPIC, 0.25 OR 0.5 MG/DOSE,) 2 MG/3ML SOPN Inject 0.5 mg into the skin once a week. 9 mL 0   spironolactone (ALDACTONE) 25 MG tablet Take 0.5 tablets (12.5 mg total) by mouth daily. 45 tablet 2   No current facility-administered medications on file prior to visit.    No Known Allergies  Social History   Socioeconomic History   Marital status: Significant Other    Spouse name: Not on file   Number of children: 3  Years of education: Not on file   Highest education level: High school graduate  Occupational History   Occupation: retired d/t medical conditions  Tobacco Use   Smoking status: Every Day    Packs/day: 0.25    Years: 2.00    Total pack years: 0.50    Types: Cigarettes    Last attempt to quit: 08/23/2020    Years since quitting: 1.2    Passive exposure: Current   Smokeless tobacco: Never  Vaping Use   Vaping Use: Never used  Substance and Sexual Activity   Alcohol use: Not Currently    Alcohol/week: 1.0 standard drink of alcohol    Types: 1 Cans of beer per week    Comment: occassionally beer   Drug  use: Not Currently    Types: Marijuana    Comment: marijuana last use 3 xweek last used 02-23-2021   Sexual activity: Not Currently  Other Topics Concern   Not on file  Social History Narrative   Not on file   Social Determinants of Health   Financial Resource Strain: High Risk (05/28/2020)   Overall Financial Resource Strain (CARDIA)    Difficulty of Paying Living Expenses: Hard  Food Insecurity: No Food Insecurity (05/28/2020)   Hunger Vital Sign    Worried About Running Out of Food in the Last Year: Never true    Ran Out of Food in the Last Year: Never true  Transportation Needs: No Transportation Needs (11/27/2021)   PRAPARE - Hydrologist (Medical): No    Lack of Transportation (Non-Medical): No  Physical Activity: Inactive (05/28/2020)   Exercise Vital Sign    Days of Exercise per Week: 0 days    Minutes of Exercise per Session: 0 min  Stress: Not on file  Social Connections: Not on file  Intimate Partner Violence: Not on file    Family History  Problem Relation Age of Onset   Diabetes Mother     Past Surgical History:  Procedure Laterality Date   AMPUTATION Right 04/24/2020   Procedure: RIGHT 2ND AND 3RD TOE AMPUTATION;  Surgeon: Newt Minion, MD;  Location: [facility];  Service: Orthopedics;  Laterality: Right;   AMPUTATION Right 10/22/2021   Procedure: RIGHT TRANSMETATARSAL AMPUTATION;  Surgeon: Newt Minion, MD;  Location: [facility];  Service: Orthopedics;  Laterality: Right;   APPLICATION OF WOUND VAC Right 10/22/2021   Procedure: APPLICATION OF WOUND VAC;  Surgeon: Newt Minion, MD;  Location: [facility];  Service: Orthopedics;  Laterality: Right;   CENTRAL VENOUS CATHETER INSERTION Left 04/03/2020   Procedure: INSERTION OF FEMORAL ARTERIAL LINE;  Surgeon: Wonda Olds, MD;  Location: [facility];  Service: Open Heart Surgery;  Laterality: Left;   CORONARY ARTERY BYPASS GRAFT N/A 04/03/2020   Procedure: CORONARY ARTERY BYPASS GRAFTING (CABG) TIMES  FOUR USING LEFT INTERNAL MAMMARY ARTERY AND LEFT RADIAL ARTERY.;  Surgeon: Wonda Olds, MD;  Location: [facility];  Service: Open Heart Surgery;  Laterality: N/A;   CORONARY PRESSURE WIRE/FFR WITH 3D MAPPING N/A 03/29/2020   Procedure: Coronary Pressure Wire/FFR w/3D Mapping;  Surgeon: Belva Crome, MD;  Location: [facility];  Service: Cardiovascular;  Laterality: N/A;   EYE SURGERY Right 01/21/2021   right eye cataract lens replacement surgery   IR THORACENTESIS ASP PLEURAL SPACE W/IMG GUIDE  05/03/2020   IR THORACENTESIS ASP PLEURAL SPACE W/IMG GUIDE  05/16/2020   LEFT HEART CATH AND CORONARY ANGIOGRAPHY N/A 03/29/2020   Procedure: LEFT  HEART CATH AND CORONARY ANGIOGRAPHY;  Surgeon: Belva Crome, MD;  Location: [facility];  Service: Cardiovascular;  Laterality: N/A;   PENILE PROSTHESIS IMPLANT N/A 03/03/2021   Procedure: PENILE PROTHESIS INFLATABLE COLOPLAST;  Surgeon: Lucas Mallow, MD;  Location: [facility];  Service: Urology;  Laterality: N/A;  REQUESTING 2 HRS   RADIAL ARTERY HARVEST Left 04/03/2020   Procedure: LEFT RADIAL ARTERY HARVEST;  Surgeon: Wonda Olds, MD;  Location: [facility];  Service: Open Heart Surgery;  Laterality: Left;   TEE WITHOUT CARDIOVERSION N/A 04/03/2020   Procedure: TRANSESOPHAGEAL ECHOCARDIOGRAM (TEE);  Surgeon: Wonda Olds, MD;  Location: [facility];  Service: Open Heart Surgery;  Laterality: N/A;   UPPER EXTREMITY ANGIOGRAPHY  03/29/2020   Procedure: Upper Extremity Angiography;  Surgeon: Belva Crome, MD;  Location: [facility];  Service: Cardiovascular;;  rt.  arm    ROS: Review of Systems Negative except as stated above  PHYSICAL EXAM: BP 118/79 (BP Location: Left Arm, Patient Position: Sitting, Cuff Size: Normal)   Pulse 88   Temp 98.3 F (36.8 C)   Resp 16   Ht 5' 10.98" (1.803 m)   Wt 155 lb (70.3 kg)   SpO2 98%   BMI 21.63 kg/m   Physical Exam HENT:     Head: Normocephalic and atraumatic.   Eyes:     Extraocular Movements: Extraocular movements intact.     Conjunctiva/sclera: Conjunctivae normal.     Pupils: Pupils are equal, round, and reactive to light.  Cardiovascular:     Rate and Rhythm: Normal rate and regular rhythm.     Pulses: Normal pulses.     Heart sounds: Normal heart sounds.  Pulmonary:     Effort: Pulmonary effort is normal.     Breath sounds: Normal breath sounds.  Musculoskeletal:     Cervical back: Normal range of motion and neck supple.  Neurological:     General: No focal deficit present.     Mental Status: He is alert and oriented to person, place, and time.  Psychiatric:        Mood and Affect: Mood normal.        Behavior: Behavior normal.    Results for orders placed or performed in visit on 11/28/21  POCT glycosylated hemoglobin (Hb A1C)  Result Value Ref Range   Hemoglobin A1C 6.9 (A) 4.0 - 5.6 %   HbA1c POC (<> result, manual entry)     HbA1c, POC (prediabetic range)     HbA1c, POC (controlled diabetic range)      ASSESSMENT AND PLAN: 1. Type 2 diabetes mellitus with hyperosmolarity without coma, without long-term current use of insulin (HCC) - Hemoglobin A1c at goal at 6.9%, goal < 8%.  - Continue Jardiance and Ozempic as prescribed. No refills needed as of present.  - Update labs.  - Referral to Endocrinology for further evaluation and management. Also, patient provided with contact information to [facility] to call and see if he can schedule an appointment soon. - Follow-up with primary provider as scheduled. - Hemoglobin Q7R - Basic Metabolic Panel - Microalbumin / creatinine urine ratio - POCT glycosylated hemoglobin (Hb A1C) - Ambulatory referral to Endocrinology  2. Diabetic eye exam [facility]) - Keep appointment scheduled 12/01/2021 with Lavella Lemons, MD.  3. Coronary artery disease involving coronary bypass graft, unspecified whether angina present, unspecified whether native or transplanted heart 4. Congestive  heart failure, unspecified HF chronicity, unspecified heart failure type ([place])  5. Hx of CABG 6. Primary hypertension 7. Hyperlipidemia, unspecified hyperlipidemia type - Continue present management.  - Referral to Cardiology for further evaluation and management.  - Ambulatory referral to Cardiology  8. Stage 3a chronic kidney disease ([place]) - Referral to Nephrology for further evaluation and management.  - Ambulatory referral to Nephrology    Patient was given the opportunity to ask questions.  Patient verbalized understanding of the plan and was able to repeat key elements of the plan. Patient was given clear instructions to go to Emergency Department or return to medical center if symptoms don't improve, worsen, or new problems develop.The patient verbalized understanding.   Orders Placed This Encounter  Procedures   Hemoglobin Y1R   Basic Metabolic Panel   Microalbumin / creatinine urine ratio   Ambulatory referral to Nephrology   Ambulatory referral to Cardiology   Ambulatory referral to Endocrinology   POCT glycosylated hemoglobin (Hb A1C)    Follow-up with primary provider as scheduled.  Camillia Herter, NP

## 2021-11-26 ENCOUNTER — Ambulatory Visit (INDEPENDENT_AMBULATORY_CARE_PROVIDER_SITE_OTHER): Payer: 59 | Admitting: Family

## 2021-11-26 DIAGNOSIS — L97521 Non-pressure chronic ulcer of other part of left foot limited to breakdown of skin: Secondary | ICD-10-CM

## 2021-11-26 DIAGNOSIS — Z89431 Acquired absence of right foot: Secondary | ICD-10-CM

## 2021-11-28 ENCOUNTER — Ambulatory Visit (INDEPENDENT_AMBULATORY_CARE_PROVIDER_SITE_OTHER): Payer: 59 | Admitting: Family

## 2021-11-28 ENCOUNTER — Encounter: Payer: Self-pay | Admitting: Family

## 2021-11-28 VITALS — BP 118/79 | HR 88 | Temp 98.3°F | Resp 16 | Ht 70.98 in | Wt 155.0 lb

## 2021-11-28 DIAGNOSIS — E785 Hyperlipidemia, unspecified: Secondary | ICD-10-CM

## 2021-11-28 DIAGNOSIS — I25810 Atherosclerosis of coronary artery bypass graft(s) without angina pectoris: Secondary | ICD-10-CM | POA: Diagnosis not present

## 2021-11-28 DIAGNOSIS — N1831 Chronic kidney disease, stage 3a: Secondary | ICD-10-CM

## 2021-11-28 DIAGNOSIS — I509 Heart failure, unspecified: Secondary | ICD-10-CM | POA: Diagnosis not present

## 2021-11-28 DIAGNOSIS — Z951 Presence of aortocoronary bypass graft: Secondary | ICD-10-CM

## 2021-11-28 DIAGNOSIS — Z0100 Encounter for examination of eyes and vision without abnormal findings: Secondary | ICD-10-CM | POA: Diagnosis not present

## 2021-11-28 DIAGNOSIS — I10 Essential (primary) hypertension: Secondary | ICD-10-CM

## 2021-11-28 DIAGNOSIS — E119 Type 2 diabetes mellitus without complications: Secondary | ICD-10-CM

## 2021-11-28 DIAGNOSIS — E1100 Type 2 diabetes mellitus with hyperosmolarity without nonketotic hyperglycemic-hyperosmolar coma (NKHHC): Secondary | ICD-10-CM

## 2021-11-28 LAB — POCT GLYCOSYLATED HEMOGLOBIN (HGB A1C): Hemoglobin A1C: 6.9 % — AB (ref 4.0–5.6)

## 2021-11-28 NOTE — Progress Notes (Signed)
.  Pt presents for chronic care management   

## 2021-11-29 LAB — BASIC METABOLIC PANEL
BUN/Creatinine Ratio: 18 (ref 10–24)
BUN: 30 mg/dL — ABNORMAL HIGH (ref 8–27)
CO2: 18 mmol/L — ABNORMAL LOW (ref 20–29)
Calcium: 9.4 mg/dL (ref 8.6–10.2)
Chloride: 104 mmol/L (ref 96–106)
Creatinine, Ser: 1.68 mg/dL — ABNORMAL HIGH (ref 0.76–1.27)
Glucose: 101 mg/dL — ABNORMAL HIGH (ref 70–99)
Potassium: 4.3 mmol/L (ref 3.5–5.2)
Sodium: 140 mmol/L (ref 134–144)
eGFR: 45 mL/min/{1.73_m2} — ABNORMAL LOW (ref >59)

## 2021-11-29 LAB — MICROALBUMIN / CREATININE URINE RATIO
Creatinine, Urine: 47.3 mg/dL
Microalb/Creat Ratio: 233 mg/g creat — ABNORMAL HIGH (ref 0–29)
Microalbumin, Urine: 110.1 ug/mL

## 2021-12-01 ENCOUNTER — Other Ambulatory Visit: Payer: Self-pay | Admitting: *Deleted

## 2021-12-01 DIAGNOSIS — E1100 Type 2 diabetes mellitus with hyperosmolarity without nonketotic hyperglycemic-hyperosmolar coma (NKHHC): Secondary | ICD-10-CM

## 2021-12-01 MED ORDER — EMPAGLIFLOZIN 25 MG PO TABS: 25.0000 mg | ORAL_TABLET | Freq: Every day | ORAL | 2 refills | Status: DC

## 2021-12-16 ENCOUNTER — Other Ambulatory Visit (HOSPITAL_COMMUNITY): Payer: Self-pay

## 2021-12-17 ENCOUNTER — Encounter: Payer: Self-pay | Admitting: Family

## 2021-12-17 ENCOUNTER — Ambulatory Visit (INDEPENDENT_AMBULATORY_CARE_PROVIDER_SITE_OTHER): Payer: 59 | Admitting: Family

## 2021-12-17 DIAGNOSIS — L97521 Non-pressure chronic ulcer of other part of left foot limited to breakdown of skin: Secondary | ICD-10-CM

## 2021-12-17 DIAGNOSIS — Z89431 Acquired absence of right foot: Secondary | ICD-10-CM

## 2021-12-17 NOTE — Progress Notes (Signed)
Post-Op Visit Note   Patient: Curtis Watkins           Date of Birth: 06/19/1955           MRN: [id number] Visit Date: 12/17/2021 PCP: Camillia Herter, NP  Chief Complaint:  Chief Complaint  Patient presents with   Right Foot - Routine Post Op    10/22/2021 right transmet amputation     HPI:  HPI The patient is a 66 year old gentleman seen status post right transmetatarsal amputation September 30 he has been doing quite well he continues in a postoperative shoe the transmetatarsal amputation is well-healed  We are also following up for Wagner grade 1 ulcers to the left foot these appear to be improving  Ortho Exam On examination of the right foot the transmetatarsal amputation is well-healed this was debrided of some nonviable hyperkeratotic tissue there is no underlying open area no drainage no erythema no warmth On examination of the left foot he has Wagner grade 1 ulcer beneath the first metatarsal head as well as beneath the fourth metatarsal head with corns these were parred back to viable tissue with a 10 blade knife.  Beneath the first metatarsal head there is remaining ulcer that is 1 cm in diameter 4 mm deep with pink tissue there is no active drainage or surrounding maceration no odor  Visit Diagnoses: No diagnosis found.  Plan: Daily Dial soap cleansing of the left foot ulcers keep these clean and dry and covered with a dressing minimize weightbearing on the left may proceed to regular shoewear on the right  Follow-Up Instructions: No follow-ups on file.   Imaging: No results found.  Orders:  No orders of the defined types were placed in this encounter.  No orders of the defined types were placed in this encounter.    PMFS History: Patient Active Problem List   Diagnosis Date Noted   Trichomonas infection 07/31/2021   Erectile dysfunction 03/03/2021   HFrEF (heart failure with reduced ejection fraction) ([place]) 08/21/2020   Acute on chronic systolic and  diastolic heart failure, NYHA class 3 ([place]) 05/25/2020   Pleural effusion    Gangrene of toe of right foot ([place])    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   Coronary artery disease involving native coronary artery of native heart with unstable angina pectoris ([place])    Acute congestive heart failure ([place]) 03/27/2020   Diabetic foot ulcers ([place]) 03/27/2020   DM2 (diabetes mellitus, type 2) ([place]) 03/27/2020   HTN (hypertension) 03/27/2020   Past Medical History:  Diagnosis Date   Blind right eye    Carotid artery disease ([place])    04/01/20: 60-79% RICA stenosis, 4-09% LICA stenosis   Chronic kidney disease    ckd stage 3 a per dr Malva Limes 02-05-2021 epic   Coronary artery disease    NSTEMI s/p CABG in 03/2020 (LIMA-->LAD/diag, L radial-->OM1/OM3); b.   DM type 2    Glaucoma of right eye, unspecified glaucoma type    HFrEF (heart failure with reduced ejection fraction) ([place])    Echo 6/22: EF 20, global HK, mild MR, RVSP 31.9   Hypertension    Ischemic cardiomyopathy    PAD (peripheral artery disease) (HCC)    occluded right axillary artery 03/29/20     Family History  Problem Relation Age of Onset   Diabetes Mother     Past Surgical History:  Procedure Laterality Date   AMPUTATION Right 04/24/2020   Procedure: RIGHT 2ND AND 3RD TOE AMPUTATION;  Surgeon: Newt Minion, MD;  Location: [facility] [place];  Service: Orthopedics;  Laterality: Right;   AMPUTATION Right 10/22/2021   Procedure: RIGHT TRANSMETATARSAL AMPUTATION;  Surgeon: Newt Minion, MD;  Location: [place];  Service: Orthopedics;  Laterality: Right;   APPLICATION OF WOUND VAC Right 10/22/2021   Procedure: APPLICATION OF WOUND VAC;  Surgeon: Newt Minion, MD;  Location: [facility];  Service: Orthopedics;  Laterality: Right;   CENTRAL VENOUS CATHETER INSERTION Left 04/03/2020   Procedure: INSERTION OF FEMORAL ARTERIAL LINE;  Surgeon: Wonda Olds, MD;  Location: [place];  Service: Open Heart Surgery;  Laterality: Left;   CORONARY ARTERY BYPASS GRAFT  N/A 04/03/2020   Procedure: CORONARY ARTERY BYPASS GRAFTING (CABG) TIMES FOUR USING LEFT INTERNAL MAMMARY ARTERY AND LEFT RADIAL ARTERY.;  Surgeon: Wonda Olds, MD;  Location: [facility];  Service: Open Heart Surgery;  Laterality: N/A;   CORONARY PRESSURE WIRE/FFR WITH 3D MAPPING N/A 03/29/2020   Procedure: Coronary Pressure Wire/FFR w/3D Mapping;  Surgeon: Belva Crome, MD;  Location: [facility];  Service: Cardiovascular;  Laterality: N/A;   EYE SURGERY Right 01/21/2021   right eye cataract lens replacement surgery   IR THORACENTESIS ASP PLEURAL SPACE W/IMG GUIDE  05/03/2020   IR THORACENTESIS ASP PLEURAL SPACE W/IMG GUIDE  05/16/2020   LEFT HEART CATH AND CORONARY ANGIOGRAPHY N/A 03/29/2020   Procedure: LEFT HEART CATH AND CORONARY ANGIOGRAPHY;  Surgeon: Belva Crome, MD;  Location: [facility];  Service: Cardiovascular;  Laterality: N/A;   PENILE PROSTHESIS IMPLANT N/A 03/03/2021   Procedure: PENILE PROTHESIS INFLATABLE COLOPLAST;  Surgeon: Lucas Mallow, MD;  Location: [facility];  Service: Urology;  Laterality: N/A;  REQUESTING 2 HRS   RADIAL ARTERY HARVEST Left 04/03/2020   Procedure: LEFT RADIAL ARTERY HARVEST;  Surgeon: Wonda Olds, MD;  Location: [facility];  Service: Open Heart Surgery;  Laterality: Left;   TEE WITHOUT CARDIOVERSION N/A 04/03/2020   Procedure: TRANSESOPHAGEAL ECHOCARDIOGRAM (TEE);  Surgeon: Wonda Olds, MD;  Location: [facility];  Service: Open Heart Surgery;  Laterality: N/A;   UPPER EXTREMITY ANGIOGRAPHY  03/29/2020   Procedure: Upper Extremity Angiography;  Surgeon: Belva Crome, MD;  Location: [facility];  Service: Cardiovascular;;  rt.  arm   Social History   Occupational History   Occupation: retired d/t medical conditions  Tobacco Use   Smoking status: Every Day    Packs/day: 0.25    Years: 2.00    Total pack years: 0.50    Types: Cigarettes    Last attempt to quit: 08/23/2020    Years since quitting: 1.3     Passive exposure: Current   Smokeless tobacco: Never  Vaping Use   Vaping Use: Never used  Substance and Sexual Activity   Alcohol use: Not Currently    Alcohol/week: 1.0 standard drink of alcohol    Types: 1 Cans of beer per week    Comment: occassionally beer   Drug use: Not Currently    Types: Marijuana    Comment: marijuana last use 3 xweek last used 02-23-2021   Sexual activity: Not Currently

## 2021-12-17 NOTE — Progress Notes (Signed)
Post-Op Visit Note   Patient: Curtis Watkins           Date of Birth: October 24, 1955           MRN: [id number] Visit Date: 11/26/2021 PCP: Camillia Herter, NP  Chief Complaint:  Chief Complaint  Patient presents with   Right Foot - Routine Post Op    10/22/21 right transmet amputation    HPI:  HPI The patient is a 66 year old gentleman who is seen status post transmetatarsal amputation on the right this is healing well  We are also following his left foot he has Wagner grade 1 ulcers beneath the first metatarsal head of the fourth metatarsal head.  Ortho Exam On examination of the right foot his transmetatarsal amputation is healing well.  Remainder of his incision is well-healed sutures in place these were harvested today without incident no gaping no drainage no erythema  On examination of the left foot also with Wagner grade 1 ulcer beneath the first metatarsal cell head this was debrided of nonviable tissue back to viable tissue with underlying ulcer which is 15 mm in diameter with 3 mm of depth there is no active drainage erythema warmth no odor.  Some callus buildup beneath the third metatarsal head which was also debrided with a 10 blade knife back to viable tissue  Visit Diagnoses: No diagnosis found.  Plan: Continue daily Dial soap cleansing.  Dry dressings to the left foot advance weightbearing as tolerated  Follow-Up Instructions: No follow-ups on file.   Imaging: No results found.  Orders:  No orders of the defined types were placed in this encounter.  No orders of the defined types were placed in this encounter.    PMFS History: Patient Active Problem List   Diagnosis Date Noted   Trichomonas infection 07/31/2021   Erectile dysfunction 03/03/2021   HFrEF (heart failure with reduced ejection fraction) ([place]) 08/21/2020   Acute on chronic systolic and diastolic heart failure, NYHA class 3 ([place]) 05/25/2020   Pleural effusion    Gangrene of toe of right foot ([place])     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   Coronary artery disease involving native coronary artery of native heart with unstable angina pectoris ([place])    Acute congestive heart failure ([place]) 03/27/2020   Diabetic foot ulcers ([place]) 03/27/2020   DM2 (diabetes mellitus, type 2) ([place]) 03/27/2020   HTN (hypertension) 03/27/2020   Past Medical History:  Diagnosis Date   Blind right eye    Carotid artery disease ([place])    04/01/20: 60-79% RICA stenosis, 2-67% LICA stenosis   Chronic kidney disease    ckd stage 3 a per dr Malva Limes 02-05-2021 epic   Coronary artery disease    NSTEMI s/p CABG in 03/2020 (LIMA-->LAD/diag, L radial-->OM1/OM3); b.   DM type 2    Glaucoma of right eye, unspecified glaucoma type    HFrEF (heart failure with reduced ejection fraction) ([place])    Echo 6/22: EF 45, global HK, mild MR, RVSP 31.9   Hypertension    Ischemic cardiomyopathy    PAD (peripheral artery disease) (HCC)    occluded right axillary artery 03/29/20     Family History  Problem Relation Age of Onset   Diabetes Mother     Past Surgical History:  Procedure Laterality Date   AMPUTATION Right 04/24/2020   Procedure: RIGHT 2ND AND 3RD TOE AMPUTATION;  Surgeon: Newt Minion, MD;  Location: [facility];  Service: Orthopedics;  Laterality: Right;  AMPUTATION Right 10/22/2021   Procedure: RIGHT TRANSMETATARSAL AMPUTATION;  Surgeon: Newt Minion, MD;  Location: [facility] [place];  Service: Orthopedics;  Laterality: Right;   APPLICATION OF WOUND VAC Right 10/22/2021   Procedure: APPLICATION OF WOUND VAC;  Surgeon: Newt Minion, MD;  Location: [facility];  Service: Orthopedics;  Laterality: Right;   CENTRAL VENOUS CATHETER INSERTION Left 04/03/2020   Procedure: INSERTION OF FEMORAL ARTERIAL LINE;  Surgeon: Wonda Olds, MD;  Location: [place];  Service: Open Heart Surgery;  Laterality: Left;   CORONARY ARTERY BYPASS GRAFT N/A 04/03/2020   Procedure: CORONARY ARTERY BYPASS GRAFTING (CABG) TIMES FOUR USING LEFT INTERNAL MAMMARY ARTERY  AND LEFT RADIAL ARTERY.;  Surgeon: Wonda Olds, MD;  Location: [place];  Service: Open Heart Surgery;  Laterality: N/A;   CORONARY PRESSURE WIRE/FFR WITH 3D MAPPING N/A 03/29/2020   Procedure: Coronary Pressure Wire/FFR w/3D Mapping;  Surgeon: Belva Crome, MD;  Location: [place] [facility];  Service: Cardiovascular;  Laterality: N/A;   EYE SURGERY Right 01/21/2021   right eye cataract lens replacement surgery   IR THORACENTESIS ASP PLEURAL SPACE W/IMG GUIDE  05/03/2020   IR THORACENTESIS ASP PLEURAL SPACE W/IMG GUIDE  05/16/2020   LEFT HEART CATH AND CORONARY ANGIOGRAPHY N/A 03/29/2020   Procedure: LEFT HEART CATH AND CORONARY ANGIOGRAPHY;  Surgeon: Belva Crome, MD;  Location: [place] [facility];  Service: Cardiovascular;  Laterality: N/A;   PENILE PROSTHESIS IMPLANT N/A 03/03/2021   Procedure: PENILE PROTHESIS INFLATABLE COLOPLAST;  Surgeon: Lucas Mallow, MD;  Location: [place];  Service: Urology;  Laterality: N/A;  REQUESTING 2 HRS   RADIAL ARTERY HARVEST Left 04/03/2020   Procedure: LEFT RADIAL ARTERY HARVEST;  Surgeon: Wonda Olds, MD;  Location: [place];  Service: Open Heart Surgery;  Laterality: Left;   TEE WITHOUT CARDIOVERSION N/A 04/03/2020   Procedure: TRANSESOPHAGEAL ECHOCARDIOGRAM (TEE);  Surgeon: Wonda Olds, MD;  Location: [place];  Service: Open Heart Surgery;  Laterality: N/A;   UPPER EXTREMITY ANGIOGRAPHY  03/29/2020   Procedure: Upper Extremity Angiography;  Surgeon: Belva Crome, MD;  Location: [facility] [facility];  Service: Cardiovascular;;  rt.  arm   Social History   Occupational History   Occupation: retired d/t medical conditions  Tobacco Use   Smoking status: Every Day    Packs/day: 0.25    Years: 2.00    Total pack years: 0.50    Types: Cigarettes    Last attempt to quit: 08/23/2020    Years since quitting: 1.3    Passive exposure: Current   Smokeless tobacco: Never  Vaping Use   Vaping Use: Never used  Substance and  Sexual Activity   Alcohol use: Not Currently    Alcohol/week: 1.0 standard drink of alcohol    Types: 1 Cans of beer per week    Comment: occassionally beer   Drug use: Not Currently    Types: Marijuana    Comment: marijuana last use 3 xweek last used 02-23-2021   Sexual activity: Not Currently

## 2021-12-24 ENCOUNTER — Telehealth: Payer: Self-pay | Admitting: Family

## 2021-12-24 NOTE — Telephone Encounter (Signed)
Copied from [place] [phone] [id number]. Topic: General - Inquiry >> Dec 24, 2021  2:41 PM Marcellus Scott wrote: Reason for CRM: Pt stated he reached out to Dr. Jacelyn Pi, MD office and he was advised they do not have a referral for him.   Please advise.   Alinda Sierras, I called the Endo clinic and they said that they did not receive the referral that was sent on 11-28-21. Please refax to [phone].

## 2021-12-24 NOTE — Telephone Encounter (Signed)
Copied from [place] [id number]. Topic: Appointment Scheduling - Scheduling Inquiry for Clinic >> Dec 24, 2021  3:02 PM Tiffany B wrote: Reason for CRM: Kieth Brightly from [facility] phone # [phone] ext [phone] returning Butch Penny call stating patient insurance is not in network therefore that is why patient has not been scheduled.  Alinda Sierras, please see this new note.

## 2021-12-31 ENCOUNTER — Other Ambulatory Visit: Payer: Self-pay | Admitting: Family

## 2021-12-31 DIAGNOSIS — E1100 Type 2 diabetes mellitus with hyperosmolarity without nonketotic hyperglycemic-hyperosmolar coma (NKHHC): Secondary | ICD-10-CM

## 2021-12-31 NOTE — Telephone Encounter (Signed)
Pt did not receive 90 tablets, he only received 30 and he also says he was told to contact his PCP for refills.   Medication Refill - Medication: empagliflozin (JARDIANCE) 25 MG TABS tablet [   Has the patient contacted their pharmacy? Yes.   (Agent: If no, request that the patient contact the pharmacy for the refill. If patient does not wish to contact the pharmacy document the reason why and proceed with request.) (Agent: If yes, when and what did the pharmacy advise?)  Preferred Pharmacy (with phone number or street name):  [name] - [place], [place] - [address]  [address] [place] [address]  Phone: [phone] Fax: [phone]   Has the patient been seen for an appointment in the last year OR does the patient have an upcoming appointment? Yes.    Agent: Please be advised that RX refills may take up to 3 business days. We ask that you follow-up with your pharmacy.

## 2021-12-31 NOTE — Telephone Encounter (Signed)
Requested medication (s) are due for refill today: patient reports he only received 30 tablets not 90  Requested medication (s) are on the active medication list: yes   Last refill:  12/01/21 #90 2 refills  Future visit scheduled: no appt in 6 days with cardiology  Notes to clinic:  last ordered by Freada Bergeron, MD. Do you want to refill Rx? Pt did not receive 90 tablets, he only received 30 and he also says he was told to contact his PCP for refills       Requested Prescriptions  Pending Prescriptions Disp Refills   empagliflozin (JARDIANCE) 25 MG TABS tablet 90 tablet 2    Sig: Take 1 tablet (25 mg total) by mouth daily before breakfast.     Endocrinology:  Diabetes - SGLT2 Inhibitors Failed - 12/31/2021  5:23 PM      Failed - Cr in normal range and within 360 days    Creatinine, Ser  Date Value Ref Range Status  11/28/2021 1.68 (H) 0.76 - 1.27 mg/dL Final         Failed - eGFR in normal range and within 360 days    GFR, Estimated  Date Value Ref Range Status  10/22/2021 58 (L) >60 mL/min Final    Comment:    (NOTE) Calculated using the CKD-EPI Creatinine Equation (2021)    eGFR  Date Value Ref Range Status  11/28/2021 45 (L) >59 mL/min/1.73 Final         Passed - HBA1C is between 0 and 7.9 and within 180 days    Hemoglobin A1C  Date Value Ref Range Status  11/28/2021 6.9 (A) 4.0 - 5.6 % Final   Hgb A1c MFr Bld  Date Value Ref Range Status  08/02/2020 8.1 (H) 4.8 - 5.6 % Final    Comment:             Prediabetes: 5.7 - 6.4          Diabetes: >6.4          Glycemic control for adults with diabetes: <7.0          Passed - Valid encounter within last 6 months    Recent Outpatient Visits           1 month ago Type 2 diabetes mellitus with hyperosmolarity without coma, without long-term current use of insulin [facility])   [facility] at [facility], Amy J, NP   5 months ago Screening for STD (sexually transmitted disease)   Primary Care at  [facility], Amy J, NP   1 year ago Type 2 diabetes mellitus with hyperosmolarity without coma, without long-term current use of insulin [facility])   [place], Jarome Matin, RPH-CPP   1 year ago Type 2 diabetes mellitus with hyperosmolarity without coma, without long-term current use of insulin [facility])   [place], Jarome Matin, RPH-CPP   1 year ago Type 2 diabetes mellitus with hyperosmolarity without coma, without long-term current use of insulin [facility])   [place], Jarome Matin, RPH-CPP       Future Appointments             In 6 days Johney Frame, Greer Ee, MD [facility]   In 2 weeks Coralyn Pear, Lodema Hong, NP [place]

## 2022-01-02 ENCOUNTER — Other Ambulatory Visit: Payer: Self-pay | Admitting: Family

## 2022-01-02 ENCOUNTER — Other Ambulatory Visit (HOSPITAL_COMMUNITY): Payer: Self-pay

## 2022-01-02 DIAGNOSIS — E119 Type 2 diabetes mellitus without complications: Secondary | ICD-10-CM

## 2022-01-02 DIAGNOSIS — E1100 Type 2 diabetes mellitus with hyperosmolarity without nonketotic hyperglycemic-hyperosmolar coma (NKHHC): Secondary | ICD-10-CM

## 2022-01-02 NOTE — Telephone Encounter (Signed)
Podiatry referral ordered. Make patient aware to expect call with appointment details soon.

## 2022-01-02 NOTE — Telephone Encounter (Signed)
Patient called in asking for sooner  assistance to get diabetic brace for foot becaue [facility] can't get him in until June. Please call back

## 2022-01-02 NOTE — Telephone Encounter (Signed)
Requested medication (s) are due for refill today: yes  Requested medication (s) are on the active medication list: yes  Last refill:  12/01/21  Future visit scheduled: no  Notes to clinic:  Unable to refill per protocol, last refill by another provider.      Requested Prescriptions  Pending Prescriptions Disp Refills   empagliflozin (JARDIANCE) 25 MG TABS tablet 90 tablet 2    Sig: Take 1 tablet (25 mg total) by mouth daily before breakfast.     There is no refill protocol information for this order

## 2022-01-04 NOTE — Progress Notes (Unsigned)
Cardiology Office Note:    Date:  01/07/2022   ID:  Curtis Watkins, DOB 01-11-1956, MRN [id number]  PCP:  Camillia Herter, NP   [facility] Providers Cardiologist:  Freada Bergeron, MD Cardiology APP:  Sharmon Revere  {    Referring MD: Camillia Herter, NP    History of Present Illness:    Curtis Watkins is a 66 y.o. male with a hx of with history of NSTEMI s/p CABG in 03/2020 (LIMA-->LAD/diag, L radial-->OM1/OM3), HFrEF with LVEF 25-30%, DMII, carotid artery disease, HTN and HLD who presents to clinic for follow-up.  The patient initially presented in 03/2020 with LE edema and SOB. TTE at that time showed LVEF 40-45% with hypokineis of the distal septal, distal inferior, distal lateral walls; and akinesis at the apex. He subsequently underwent LHC which revealed severe two-vessel disease involving the LAD and circumflex. He underwent 4vCABG on 04/03/20.  His postoperative course was relatively uncomplicated and he was discharged on the 13th.   In early 04/2020 he underwent second and third toe amputation for a known diabetic foot ulcer.  He was also noted to have a large left pleural effusion at his surgical follow-up in 04/2020, and underwent thoracentesis on the 05/16/20 with over 1-1/2 L of serosanguineous fluid removal.  The patient continued to have SOB and volume overload after discharge prompting admission 05/24/20-05/28/20.  During admission TTE demonstrated worsening LV function with an EF of 25-30. He was diuresed 20lbs to 158 with improvement in symptoms.  He also underwent repeat R thoracentesis with 1500cc removed. His GDMT was optimized and he was discharged on losartan 12.98m, metoprolol succinate 12.512m spiro 12.12m76mand jardiance 38m3md lasix 40mg312mr increased slightly during admission 1.30>1.55.    Was last seen in clinic 01/2021 by EmilyDiona Brownerpre-op visit for penile prosthesis. Was active from a CV standpoint with no significant symptoms.  He subsequently  underwent right transmetatarsal amputation with orthopedics on 09/2021.  Today, the patient overall feels well. No chest pain, SOB, LE edema, orthopnea or PND. Tolerating medications without issue. Weight has been stable. Blood pressure has been well controlled with no dizziness. Continues to smoke about 1ppd. He is trying to quit.   Past Medical History:  Diagnosis Date   Blind right eye    Carotid artery disease (HCC) [place]2/7/22: 60-79% RICA stenosis, 1-39%3-20% stenosis   Chronic kidney disease    ckd stage 3 a per dr pembeMalva Limes4-2022 epic   Coronary artery disease    NSTEMI s/p CABG in 03/2020 (LIMA-->LAD/diag, L radial-->OM1/OM3); b.   DM type 2    Glaucoma of right eye, unspecified glaucoma type    HFrEF (heart failure with reduced ejection fraction) (HCC) [place] 6[place]: EF 45, global HK, mild MR, RVSP 31.9   Hypertension    Ischemic cardiomyopathy    PAD (peripheral artery disease) (HCC) [place] 03[place]20     Past Surgical History:  Procedure Laterality Date   AMPUTATION Right 04/24/2020   Procedure: RIGHT 2ND AND 3RD TOE AMPUTATION;  Surgeon: Duda,Newt Minion  [facility]ics;  Laterality: Right;   AMPUTATION Right 10/22/2021   Procedure: RIGHT TRANSMETATARSAL AMPUTATION;  Surgeon: Duda,Newt Minion  [facility] Orthopedics;  Laterality: Right;   APPLICATION OF WOUND VAC Right 10/22/2021   Procedure: APPLICATION OF WOUND VAC;  Surgeon: Duda,Newt Minion  MD;  Location: [place];  Service: Orthopedics;  Laterality: Right;   CENTRAL VENOUS CATHETER INSERTION Left 04/03/2020   Procedure: INSERTION OF FEMORAL ARTERIAL LINE;  Surgeon: Wonda Olds, MD;  Location: [place];  Service: Open Heart Surgery;  Laterality: Left;   CORONARY ARTERY BYPASS GRAFT N/A 04/03/2020   Procedure: CORONARY ARTERY BYPASS GRAFTING (CABG) TIMES FOUR USING LEFT INTERNAL MAMMARY ARTERY AND LEFT RADIAL ARTERY.;  Surgeon: Wonda Olds,  MD;  Location: [place];  Service: Open Heart Surgery;  Laterality: N/A;   CORONARY PRESSURE WIRE/FFR WITH 3D MAPPING N/A 03/29/2020   Procedure: Coronary Pressure Wire/FFR w/3D Mapping;  Surgeon: Belva Crome, MD;  Location: [place] [facility];  Service: Cardiovascular;  Laterality: N/A;   EYE SURGERY Right 01/21/2021   right eye cataract lens replacement surgery   IR THORACENTESIS ASP PLEURAL SPACE W/IMG GUIDE  05/03/2020   IR THORACENTESIS ASP PLEURAL SPACE W/IMG GUIDE  05/16/2020   LEFT HEART CATH AND CORONARY ANGIOGRAPHY N/A 03/29/2020   Procedure: LEFT HEART CATH AND CORONARY ANGIOGRAPHY;  Surgeon: Belva Crome, MD;  Location: [place] [facility];  Service: Cardiovascular;  Laterality: N/A;   PENILE PROSTHESIS IMPLANT N/A 03/03/2021   Procedure: PENILE PROTHESIS INFLATABLE COLOPLAST;  Surgeon: Lucas Mallow, MD;  Location: [place];  Service: Urology;  Laterality: N/A;  REQUESTING 2 HRS   RADIAL ARTERY HARVEST Left 04/03/2020   Procedure: LEFT RADIAL ARTERY HARVEST;  Surgeon: Wonda Olds, MD;  Location: [place];  Service: Open Heart Surgery;  Laterality: Left;   TEE WITHOUT CARDIOVERSION N/A 04/03/2020   Procedure: TRANSESOPHAGEAL ECHOCARDIOGRAM (TEE);  Surgeon: Wonda Olds, MD;  Location: [place];  Service: Open Heart Surgery;  Laterality: N/A;   UPPER EXTREMITY ANGIOGRAPHY  03/29/2020   Procedure: Upper Extremity Angiography;  Surgeon: Belva Crome, MD;  Location: [place] [facility];  Service: Cardiovascular;;  rt.  arm    Current Medications: Current Meds  Medication Sig   aspirin EC 325 MG tablet Take 162.5 mg by mouth daily.   blood glucose meter kit and supplies by Other route as directed. Dispense based on patient and insurance preference. Use up to four times daily as directed. (FOR ICD-10 E10.9, E11.9).   brimonidine (ALPHAGAN) 0.2 % ophthalmic solution Place 1 drop into the left eye 2 (two) times daily.   dorzolamide-timolol (COSOPT) 22.3-6.8  MG/ML ophthalmic solution Place 1 drop into the left eye 2 (two) times daily.   losartan (COZAAR) 25 MG tablet Take 0.5 tablets (12.5 mg total) by mouth daily.   metoprolol succinate (TOPROL XL) 25 MG 24 hr tablet Take 1.5 tablets (37.5 mg total) by mouth daily.   spironolactone (ALDACTONE) 25 MG tablet Take 0.5 tablets (12.5 mg total) by mouth daily.   [DISCONTINUED] atorvastatin (LIPITOR) 80 MG tablet Take 1 tablet (80 mg total) by mouth daily.   [DISCONTINUED] empagliflozin (JARDIANCE) 25 MG TABS tablet Take 1 tablet (25 mg total) by mouth daily before breakfast.   [DISCONTINUED] furosemide (LASIX) 20 MG tablet Take 1 tablet (20 mg total) by mouth daily.   [DISCONTINUED] furosemide (LASIX) 20 MG tablet Take 1 tablet (20 mg total) by mouth 3 (three) times a week. Take on Mondays, Wednesdays, and Fridays.     Allergies:   Patient has no known allergies.   Social History   Socioeconomic History   Marital status: Significant Other    Spouse name: Not on file   Number of children: 3  Years of education: Not on file   Highest education level: High school graduate  Occupational History   Occupation: retired d/t medical conditions  Tobacco Use   Smoking status: Every Day    Packs/day: 1.00    Years: 0.50    Total pack years: 0.50    Types: Cigarettes    Passive exposure: Current   Smokeless tobacco: Never  Vaping Use   Vaping Use: Never used  Substance and Sexual Activity   Alcohol use: Not Currently    Alcohol/week: 1.0 standard drink of alcohol    Types: 1 Cans of beer per week    Comment: occassionally beer   Drug use: Not Currently    Types: Marijuana    Comment: marijuana last use 3 xweek last used 02-23-2021   Sexual activity: Not Currently  Other Topics Concern   Not on file  Social History Narrative   Not on file   Social Determinants of Health   Financial Resource Strain: High Risk (05/28/2020)   Overall Financial Resource Strain (CARDIA)    Difficulty of Paying  Living Expenses: Hard  Food Insecurity: No Food Insecurity (05/28/2020)   Hunger Vital Sign    Worried About Running Out of Food in the Last Year: Never true    Ran Out of Food in the Last Year: Never true  Transportation Needs: No Transportation Needs (11/27/2021)   PRAPARE - Hydrologist (Medical): No    Lack of Transportation (Non-Medical): No  Physical Activity: Inactive (05/28/2020)   Exercise Vital Sign    Days of Exercise per Week: 0 days    Minutes of Exercise per Session: 0 min  Stress: Not on file  Social Connections: Not on file     Family History: The patient's family history includes Diabetes in his mother.  ROS:   Review of Systems  Constitutional:  Negative for chills, fever and malaise/fatigue.  HENT:  Negative for congestion and hearing loss.   Eyes:  Negative for redness.  Respiratory:  Negative for cough and shortness of breath.   Cardiovascular:  Negative for chest pain, palpitations, orthopnea, claudication, leg swelling and PND.  Gastrointestinal:  Negative for abdominal pain and blood in stool.  Genitourinary:  Negative for dysuria and hematuria.  Musculoskeletal:  Negative for back pain, joint pain and neck pain.  Neurological:  Negative for dizziness and headaches.  Endo/Heme/Allergies:  Negative for polydipsia. Does not bruise/bleed easily.  Psychiatric/Behavioral:  Negative for depression. The patient does not have insomnia.      EKGs/Labs/Other Studies Reviewed:    The following studies were reviewed today:  LIMITED ECHO: 08/20/2020: IMPRESSIONS   1. Left ventricular ejection fraction, by estimation, is 45%. The left  ventricle has mildly decreased function. The left ventricle demonstrates  global hypokinesis. Left ventricular diastolic function could not be  evaluated.   2. The mitral valve is grossly normal. Mild mitral valve regurgitation.  No evidence of mitral stenosis.   3. The aortic valve is tricuspid. Aortic  valve regurgitation is not  visualized.   4. There is normal pulmonary artery systolic pressure.   Comparison(s): A prior study was performed on 05/26/20. Prior images  reviewed side by side. LV function has improved.   Limited Echocardiogram 05/26/20 EF 25-30, global HK, small effusion without tamponade   Pre-CABG Dopplers 04/01/20 R 60-79; L 1-39   Echocardiogram 03/28/20 EF 40-45, mild LVH, GRII DD normal RVSF, mild LAE, mild MR   LEFT HEART CATH 03/29/2020 Narrative  Diabetic with severe two-vessel disease including the LAD and complex disease in the circumflex.  Ischemic cardiomyopathy with LVEF 35 to 40%.  LVEDP is less than 10 mmHg.  Left main is widely patent  Right coronary is widely patent but contains diffuse atherosclerosis without focal narrowing.  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 RECOMMENDATIONS:  T CTS evaluation to consider coronary artery bypass grafting with LIMA to the LAD.  SVG to circumflex or arterial graft assuming that the left radial has flow..    EKG:   01/06/22: NSR with LVH HR 69  Recent Labs: 10/22/2021: Hemoglobin 10.6; Platelets 307 11/28/2021: BUN 30; Creatinine, Ser 1.68; Potassium 4.3; Sodium 140  Recent Lipid Panel    Component Value Date/Time   CHOL 100 08/02/2020 1146   TRIG 55 08/02/2020 1146   HDL 52 08/02/2020 1146   CHOLHDL 1.9 08/02/2020 1146   CHOLHDL 2.8 03/29/2020 0600   VLDL 12 03/29/2020 0600   LDLCALC 35 08/02/2020 1146         Physical Exam:    VS:  BP 100/62   Pulse 69   Ht _0  (1.778 m)   Wt 164 lb 6.4 oz (74.6 kg)   SpO2 99%   BMI 23.59 kg/m     Wt Readings from Last 3 Encounters:  01/06/22 164 lb 6.4 oz (74.6 kg)  11/28/21 155 lb (70.3 kg)  10/22/21 155 lb (70.3 kg)     GEN:  Well nourished, well developed in no acute distress HEENT: Normal NECK: No JVD; No carotid bruits CARDIAC: RRR,  1/6 systolic murmur. No rubs, gallops RESPIRATORY:  Clear to auscultation without rales, wheezing or  rhonchi  ABDOMEN: Soft, non-tender, non-distended MUSCULOSKELETAL:  No edema; No deformity  SKIN: Warm and dry NEUROLOGIC:  Alert and oriented x 3 PSYCHIATRIC:  Normal affect   ASSESSMENT:    1. Chronic combined systolic and diastolic heart failure ([place])   2. Type 2 diabetes mellitus with hyperosmolarity without coma, without long-term current use of insulin ([place])   3. Coronary artery disease involving native coronary artery of native heart without angina pectoris   4. Chronic systolic heart failure ([place])   5. Essential hypertension   6. Bilateral carotid artery disease, unspecified type ([place])   7. HFrEF (heart failure with reduced ejection fraction) ([place])   8. Tobacco abuse   9. Stage 3a chronic kidney disease (HCC)    PLAN:    In order of problems listed above:  #Chronic systolic heart failure, Ischemic Cardiomyopathy:  Patient with history of CABG x4 in 03/2020 with EF at 40-45% on 05/2020 dropped to 25-30% in 05/2020, however, limited TTE 08/20/20 with recovery back to 45%. Currently doing better with NYHA class I-II symptoms.  -Continue metop succinate 37.64m daily -Continue lasix 247mdaily -Continue losartan 12.5 mg daily (does not want to switch to entresto due to soft BP) -Continue aldactone 12.5 mg daily -Continue Jardiance 1031maily -Low Na diet -Monitor daily weights   #Recurrent Pleural Effusions: Have been present since CABG in February requiring thoracentesis x2 (once in 04/2020 in the outpatient setting by IR and also in 05/2020 during admission by PCCM). Last CXR with small persistent effusions. Currently no shortness of breath or orthopnea. Weights stable.  -Continue lasix 75m54mily       #CAD status post CABG  Underwent 4v CABG in 03/2020 with LIMA-->LAD/diag, L radial-->OM1/OM3. Now doing well without anginal symptoms.  -Continue ASA 81mg40mly -Continue plavix 75mg 110my -Continue metop succinate 37.5mg da2m -  Continue losartan 12.5 mg daily   #Tobacco  Use Disorder: Working on quitting.  -Continue smoking cessation efforts    #Hypertension  Controlled.  -Continue metop succinate 37.68m daily -Continue losartan 12.5 mg daily -Continue aldactone 12.5 mg daily    #T2DM:  -Continue Jardiance 114mdaily   -last HbA1c 6.9    #CKD IIIa:  -Trend    FOLLOW UP IN 6 months   Medication Adjustments/Labs and Tests Ordered: Current medicines are reviewed at length with the patient today.  Concerns regarding medicines are outlined above.  Orders Placed This Encounter  Procedures   EKG 12-Lead   Meds ordered this encounter  Medications   DISCONTD: empagliflozin (JARDIANCE) 25 MG TABS tablet    Sig: Take 1 tablet (25 mg total) by mouth daily before breakfast.    Dispense:  90 tablet    Refill:  2   DISCONTD: atorvastatin (LIPITOR) 80 MG tablet    Sig: Take 1 tablet (80 mg total) by mouth daily.    Dispense:  90 tablet    Refill:  3    HF fund   DISCONTD: furosemide (LASIX) 20 MG tablet    Sig: Take 1 tablet (20 mg total) by mouth 3 (three) times a week. Take on Mondays, Wednesdays, and Fridays.    Dispense:  45 tablet    Refill:  2    Dose decrease   atorvastatin (LIPITOR) 80 MG tablet    Sig: Take 1 tablet (80 mg total) by mouth daily.    Dispense:  90 tablet    Refill:  3    HF fund   empagliflozin (JARDIANCE) 25 MG TABS tablet    Sig: Take 1 tablet (25 mg total) by mouth daily before breakfast.    Dispense:  90 tablet    Refill:  2   furosemide (LASIX) 20 MG tablet    Sig: Take 1 tablet (20 mg total) by mouth 3 (three) times a week. Take on Mondays, Wednesdays, and Fridays.    Dispense:  45 tablet    Refill:  2    Dose decrease    Patient Instructions  Medication Instructions:   DECREASE YOUR LASIX TO TAKING 20 MG BY MOUTH THREE TIMES WEEKLY--TAKE ON MONDAYS, WEDNESDAYS, AND FRIDAYS   *If you need a refill on your cardiac medications before your next appointment, please call your pharmacy*    Follow-Up: At  [facility] and your health needs are our priority.  As part of our continuing mission to provide you with exceptional heart care, we have created designated Provider Care Teams.  These Care Teams include your primary Cardiologist (physician) and Advanced Practice Providers (APPs -  Physician Assistants and Nurse Practitioners) who all work together to provide you with the care you need, when you need it.  We recommend signing up for the patient portal called "MyChart".  Sign up information is provided on this After Visit Summary.  MyChart is used to connect with patients for Virtual Visits (Telemedicine).  Patients are able to view lab/test results, encounter notes, upcoming appointments, etc.  Non-urgent messages can be sent to your provider as well.   To learn more about what you can do with MyChart, go to [email or web address]   Your next appointment:   6 month(s)  The format for your next appointment:   In Person  Provider:   HeFreada BergeronMD     Important Information About Sugar  Signed, Freada Bergeron, MD  01/07/2022 6:38 AM    [place]

## 2022-01-05 ENCOUNTER — Other Ambulatory Visit: Payer: Self-pay | Admitting: Family

## 2022-01-05 ENCOUNTER — Telehealth: Payer: Self-pay | Admitting: Family

## 2022-01-05 ENCOUNTER — Other Ambulatory Visit: Payer: Self-pay

## 2022-01-05 ENCOUNTER — Other Ambulatory Visit (HOSPITAL_COMMUNITY): Payer: Self-pay

## 2022-01-05 DIAGNOSIS — E1100 Type 2 diabetes mellitus with hyperosmolarity without nonketotic hyperglycemic-hyperosmolar coma (NKHHC): Secondary | ICD-10-CM

## 2022-01-05 MED ORDER — SPIRONOLACTONE 25 MG PO TABS: 12.5000 mg | ORAL_TABLET | Freq: Every day | ORAL | 0 refills | Status: DC

## 2022-01-05 NOTE — Telephone Encounter (Signed)
Order complete. 

## 2022-01-05 NOTE — Telephone Encounter (Signed)
Ordered by Laurance Flatten, MD on 12/01/2021 for 90 day supply and 2 additional refills. Please call patient to confirm if he is aware of this and/or the medical necessity of early refill request.

## 2022-01-05 NOTE — Telephone Encounter (Signed)
Patient states he spoke with [facility] and they advised him to call his PCP and request a new prescription for empagliflozin (JARDIANCE) 25 MG TABS tablet. Patient would like request expedited and would like a follow up when completed.    [facility] - [place] - [address] Phone: [phone]  Fax: [phone]

## 2022-01-05 NOTE — Telephone Encounter (Signed)
Patient states Diabetic doctor is unable to see patient until June and would like PCP to fill out form so he can be fitted for a leg support equipment. Patient would like a follow up call.

## 2022-01-06 ENCOUNTER — Other Ambulatory Visit (HOSPITAL_COMMUNITY): Payer: Self-pay

## 2022-01-06 ENCOUNTER — Ambulatory Visit: Payer: 59 | Admitting: Cardiology

## 2022-01-06 ENCOUNTER — Encounter: Payer: Self-pay | Admitting: Cardiology

## 2022-01-06 ENCOUNTER — Other Ambulatory Visit: Payer: Self-pay | Admitting: Family

## 2022-01-06 ENCOUNTER — Ambulatory Visit: Payer: 59 | Attending: Cardiology | Admitting: Cardiology

## 2022-01-06 VITALS — BP 100/62 | HR 69 | Ht 70.0 in | Wt 164.4 lb

## 2022-01-06 DIAGNOSIS — E1100 Type 2 diabetes mellitus with hyperosmolarity without nonketotic hyperglycemic-hyperosmolar coma (NKHHC): Secondary | ICD-10-CM | POA: Diagnosis not present

## 2022-01-06 DIAGNOSIS — I5042 Chronic combined systolic (congestive) and diastolic (congestive) heart failure: Secondary | ICD-10-CM

## 2022-01-06 DIAGNOSIS — I2510 Atherosclerotic heart disease of native coronary artery without angina pectoris: Secondary | ICD-10-CM

## 2022-01-06 DIAGNOSIS — I5022 Chronic systolic (congestive) heart failure: Secondary | ICD-10-CM

## 2022-01-06 DIAGNOSIS — I10 Essential (primary) hypertension: Secondary | ICD-10-CM | POA: Diagnosis not present

## 2022-01-06 DIAGNOSIS — N1831 Chronic kidney disease, stage 3a: Secondary | ICD-10-CM

## 2022-01-06 DIAGNOSIS — Z720 Tobacco use: Secondary | ICD-10-CM

## 2022-01-06 DIAGNOSIS — I5020 Unspecified systolic (congestive) heart failure: Secondary | ICD-10-CM

## 2022-01-06 DIAGNOSIS — I779 Disorder of arteries and arterioles, unspecified: Secondary | ICD-10-CM

## 2022-01-06 MED ORDER — ATORVASTATIN CALCIUM 80 MG PO TABS
80.0000 mg | ORAL_TABLET | Freq: Every day | ORAL | 3 refills | Status: DC
  Filled 2022-01-06: qty 90, 90d supply, fill #0
  Filled 2022-04-15 – 2022-04-16 (×2): qty 90, 90d supply, fill #1
  Filled 2022-06-22 – 2022-06-23 (×2): qty 90, 90d supply, fill #2
  Filled 2022-10-23: qty 90, 90d supply, fill #3
  Filled ????-??-??: fill #1

## 2022-01-06 MED ORDER — ATORVASTATIN CALCIUM 80 MG PO TABS: 80.0000 mg | ORAL_TABLET | Freq: Every day | ORAL | 3 refills | Status: DC

## 2022-01-06 MED ORDER — EMPAGLIFLOZIN 25 MG PO TABS
25.0000 mg | ORAL_TABLET | Freq: Every day | ORAL | 2 refills | Status: DC
  Filled 2022-01-06 – 2022-01-07 (×3): qty 90, 90d supply, fill #0

## 2022-01-06 MED ORDER — FUROSEMIDE 20 MG PO TABS
20.0000 mg | ORAL_TABLET | ORAL | 2 refills | Status: DC
  Filled 2022-01-06: qty 43, 100d supply, fill #0
  Filled 2022-01-07 (×2): qty 36, 84d supply, fill #0

## 2022-01-06 MED ORDER — EMPAGLIFLOZIN 25 MG PO TABS: 25.0000 mg | ORAL_TABLET | Freq: Every day | ORAL | 2 refills | Status: DC

## 2022-01-06 MED ORDER — FUROSEMIDE 20 MG PO TABS: 20.0000 mg | ORAL_TABLET | ORAL | 2 refills | Status: DC

## 2022-01-06 NOTE — Telephone Encounter (Signed)
Copied from CRM [id number]. Topic: General - Inquiry >> Jan 06, 2022  1:29 PM Marlow Baars wrote: Reason for CRM: The patient called in stating the pharmacy sent two requests to refill his Jardiance but they have had no respsonse. Please assist patient futher   Amy Zonia Kief, Np does not manage patient Curtis Watkins, pt is pt of [facility] w/Sean Everardo All, MD who is no longer there, pt needs to contact [place] for an appt w/rendering provider since [place],MD no longer available

## 2022-01-06 NOTE — Patient Instructions (Signed)
Medication Instructions:   DECREASE YOUR LASIX TO TAKING 20 MG BY MOUTH THREE TIMES WEEKLY--TAKE ON MONDAYS, WEDNESDAYS, AND FRIDAYS   *If you need a refill on your cardiac medications before your next appointment, please call your pharmacy*    Follow-Up: At [facility], you and your health needs are our priority.  As part of our continuing mission to provide you with exceptional heart care, we have created designated Provider Care Teams.  These Care Teams include your primary Cardiologist (physician) and Advanced Practice Providers (APPs -  Physician Assistants and Nurse Practitioners) who all work together to provide you with the care you need, when you need it.  We recommend signing up for the patient portal called "MyChart".  Sign up information is provided on this After Visit Summary.  MyChart is used to connect with patients for Virtual Visits (Telemedicine).  Patients are able to view lab/test results, encounter notes, upcoming appointments, etc.  Non-urgent messages can be sent to your provider as well.   To learn more about what you can do with MyChart, go to [email or web address].    Your next appointment:   6 month(s)  The format for your next appointment:   In Person  Provider:   Meriam Sprague, MD     Important Information About Sugar

## 2022-01-07 ENCOUNTER — Other Ambulatory Visit (HOSPITAL_COMMUNITY): Payer: Self-pay

## 2022-01-13 ENCOUNTER — Ambulatory Visit (INDEPENDENT_AMBULATORY_CARE_PROVIDER_SITE_OTHER): Payer: 59 | Admitting: Family

## 2022-01-13 ENCOUNTER — Encounter: Payer: Self-pay | Admitting: Family

## 2022-01-13 DIAGNOSIS — Z89431 Acquired absence of right foot: Secondary | ICD-10-CM

## 2022-01-13 DIAGNOSIS — L97521 Non-pressure chronic ulcer of other part of left foot limited to breakdown of skin: Secondary | ICD-10-CM

## 2022-01-13 NOTE — Progress Notes (Signed)
Office Visit Note   Patient: Curtis Watkins           Date of Birth: [birth year]           MRN: [id number] Visit Date: 01/13/2022              Requested by: Rema Fendt, NP [address] [place],  [place] [address] PCP: Rema Fendt, NP  No chief complaint on file.     HPI: The patient is a 66 year old gentleman who presents today in follow-up.  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  Assessment & Plan: Visit Diagnoses: No diagnosis found.  Plan: Ulcers to the right foot were debrided as well as the transmetatarsal amputation.  He will continue daily to also cleansing wound care offload pressure from the ulcerative areas of the left foot he will follow-up in the office in 4 weeks for repeat evaluation  Follow-Up Instructions: No follow-ups on file.   Ortho Exam  Patient is alert, oriented, no adenopathy, well-dressed, normal affect, normal respiratory effort. On examination of the right foot his transmetatarsal amputation is well-healed there is buildup of hyperkeratotic tissue.  No open ulcer no drainage no erythema no warmth  On examination of the left foot there is Wagner grade 1 ulcer beneath the first metatarsal head as well as the fourth metatarsal head.  These are debrided of nonviable tissue back to viable tissue underlying the ulcer is 15 mm in diameter with 4 mm of depth there is epiboly to the wound edges there is no active drainage erythema or odor.  Imaging: No results found. No images are attached to the encounter.  Labs: Lab Results  Component Value Date   HGBA1C 6.9 (A) 11/28/2021   HGBA1C 6.7 (A) 02/07/2021   HGBA1C 6.7 (A) 11/01/2020     Lab Results  Component Value Date   ALBUMIN 4.0 08/02/2020    Lab Results  Component Value Date   MG 2.6 (H) 04/04/2020   MG 2.6 (H) 04/04/2020   MG 2.9 (H)  04/03/2020   No results found for: "VD25OH"  No results found for: "PREALBUMIN"    Latest Ref Rng & Units 10/22/2021    8:24 AM 03/03/2021    8:50 AM 08/02/2020   11:46 AM  CBC EXTENDED  WBC 4.0 - 10.5 K/uL 8.0   8.0   RBC 4.22 - 5.81 MIL/uL 3.40   3.92   Hemoglobin 13.0 - 17.0 g/dL 98.9  21.1  94.1   HCT 39.0 - 52.0 % 32.3  36.0  34.9   Platelets 150 - 400 K/uL 307   237      There is no height or weight on file to calculate BMI.  Orders:  No orders of the defined types were placed in this encounter.  No orders of the defined types were placed in this encounter.    Procedures: No procedures performed  Clinical Data: No additional findings.  ROS:  All other systems negative, except as noted in the HPI. Review of Systems  Objective: Vital Signs: There were no vitals taken for this visit.  Specialty Comments:  No specialty comments available.  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   Coronary artery disease involving native coronary artery of native heart with unstable angina pectoris (HCC)    Acute congestive heart failure (HCC) 03/27/2020   Diabetic foot ulcers (HCC) 03/27/2020   DM2 (diabetes mellitus, type 2) (HCC) 03/27/2020   HTN (hypertension) 03/27/2020   Past Medical History:  Diagnosis Date   Blind right eye    Carotid artery disease (HCC)    04/01/20: 60-79% RICA stenosis, 1-39% LICA stenosis   Chronic kidney disease    ckd stage 3 a per dr Ronald Lobo 02-05-2021 epic   Coronary artery disease    NSTEMI s/p CABG in 03/2020 (LIMA-->LAD/diag, L radial-->OM1/OM3); b.   DM type 2    Glaucoma of right eye,  unspecified glaucoma type    HFrEF (heart failure with reduced ejection fraction) (HCC)    Echo 6/22: EF 45, global HK, mild MR, RVSP 31.9   Hypertension    Ischemic cardiomyopathy    PAD (peripheral artery disease) (HCC)    occluded right axillary artery 03/29/20     Family History  Problem Relation Age of Onset   Diabetes Mother     Past Surgical History:  Procedure Laterality Date   AMPUTATION Right 04/24/2020   Procedure: RIGHT 2ND AND 3RD TOE AMPUTATION;  Surgeon: Nadara Mustard, MD;  Location[facility];  Service: Orthopedics;  Laterality: Right;   AMPUTATION Right 10/22/2021   Procedure: RIGHT TRANSMETATARSAL AMPUTATION;  Surgeon: Nadara Mustard, MD;  Location[facility] OR;  Service: Orthopedics;  Laterality: Right;   APPLICATION OF WOUND VAC Right 10/22/2021   Procedure: APPLICATION OF WOUND VAC;  Surgeon: Nadara Mustard, MD;  [facility]: [facility] OR;  Service: Orthopedics;  Laterality: Right;   CENTRAL VENOUS CATHETER INSERTION Left 04/03/2020   Procedure: INSERTION OF FEMORAL ARTERIAL LINE;  Surgeon: Linden Dolin, MD;  Location: [facility] OR;  Service: Open Heart Surgery;  Laterality: Left;   CORONARY ARTERY BYPASS GRAFT N/A 04/03/2020   Procedure: CORONARY ARTERY BYPASS GRAFTING (CABG) TIMES FOUR USING LEFT INTERNAL MAMMARY ARTERY AND LEFT RADIAL ARTERY.;  Surgeon: Linden Dolin, MD;  Location: [facility];  Service: Open Heart Surgery;  Laterality: N/A;   CORONARY PRESSURE WIRE/FFR WITH 3D MAPPING N/A 03/29/2020   Procedure: Coronary Pressure Wire/FFR w/3D Mapping;  Surgeon: Lyn Records, MD;  [facility] INVAS[facility];  Service: Cardiovascular;  Laterality: N/A;   EYE SURGERY Right 01/21/2021   right eye cataract lens replacement surgery   IR THORACENTESIS ASP PLEURAL SPACE W/IMG GUIDE  05/03/2020   IR THORACENTESIS ASP PLEURAL SPACE W/IMG GUIDE  05/16/2020   LEFT HEART CATH AND CORONARY ANGIOGRAPHY N/A 03/29/2020   Procedure: LEFT HEART CATH AND CORONARY ANGIOGRAPHY;  Surgeon: Lyn Records,  MD;  [facility];  Service: Cardiovascular;  Laterality: N/A;   PENILE PROSTHESIS IMPLANT N/A 03/03/2021   Procedure: PENILE PROTHESIS INFLATABLE COLOPLAST;  Surgeon: Crista Elliot, MD;  Location[facility];  Service: Urology;  Laterality: N/A;  REQUESTING 2 HRS   RADIAL ARTERY HARVEST Left 04/03/2020   Procedure: LEFT RADIAL ARTERY HARVEST;  Surgeon: Linden Dolin, MD;  [facility];  Service: Open Heart Surgery;  Laterality: Left;   TEE WITHOUT CARDIOVERSION N/A 04/03/2020   Procedure: TRANSESOPHAGEAL ECHOCARDIOGRAM (TEE);  Surgeon: Vickey Sages,  Merri Brunette, MD;  Location: [facility];  Service: Open Heart Surgery;  Laterality: N/A;   UPPER EXTREMITY ANGIOGRAPHY  03/29/2020   Procedure: Upper Extremity Angiography;  Surgeon: Lyn Records, MD;  Location: [facility] INVASIVE CV LAB;  Service: Cardiovascular;;  rt.  arm   Social History   Occupational History   Occupation: retired d/t medical conditions  Tobacco Use   Smoking status: Every Day    Packs/day: 1.00    Years: 0.50    Total pack years: 0.50    Types: Cigarettes    Passive exposure: Current   Smokeless tobacco: Never  Vaping Use   Vaping Use: Never used  Substance and Sexual Activity   Alcohol use: Not Currently    Alcohol/week: 1.0 standard drink of alcohol    Types: 1 Cans of beer per week    Comment: occassionally beer   Drug use: Not Currently    Types: Marijuana    Comment: marijuana last use 3 xweek last used 02-23-2021   Sexual activity: Not Currently

## 2022-01-14 ENCOUNTER — Ambulatory Visit: Payer: 59 | Admitting: Family

## 2022-01-29 ENCOUNTER — Encounter: Payer: Self-pay | Admitting: Family

## 2022-02-11 ENCOUNTER — Ambulatory Visit (INDEPENDENT_AMBULATORY_CARE_PROVIDER_SITE_OTHER): Payer: 59 | Admitting: Family

## 2022-02-11 ENCOUNTER — Other Ambulatory Visit: Payer: Self-pay

## 2022-02-11 ENCOUNTER — Encounter: Payer: Self-pay | Admitting: Family

## 2022-02-11 DIAGNOSIS — L97521 Non-pressure chronic ulcer of other part of left foot limited to breakdown of skin: Secondary | ICD-10-CM | POA: Diagnosis not present

## 2022-02-11 DIAGNOSIS — Z89431 Acquired absence of right foot: Secondary | ICD-10-CM | POA: Diagnosis not present

## 2022-02-11 MED ORDER — METOPROLOL SUCCINATE ER 25 MG PO TB24: 37.5000 mg | ORAL_TABLET | Freq: Every day | ORAL | 1 refills | Status: DC

## 2022-02-11 NOTE — Progress Notes (Signed)
Office Visit Note   Patient: Curtis Watkins           Date of Birth: Mar 21, 1955           MRN: [id number] Visit Date: 02/11/2022              Requested by: Rema Fendt, NP [address] [place],  [place] [address] PCP: Rema Fendt, NP  Chief Complaint  Patient presents with   Right Foot - Follow-up    Right transmet amputation 10/22/21      HPI: The patient is a 66 year old gentleman who presents today in routine follow-up for bilateral foot check.  His transmetatarsal amputation on the right is stable.  He does have Wagner grade 1 ulcer x 2 beneath the left foot  Works on his Physicist, medical.  Takes frequent breaks.  No new concerns.  Assessment & Plan: Visit Diagnoses: No diagnosis found.  Plan: Continue with daily Dial soap cleansing.  Antibacterial ointment dressing changes to the first metatarsal head on the left.  Follow-up in 4 weeks for foot evaluation  Follow-Up Instructions: No follow-ups on file.   Ortho Exam  Patient is alert, oriented, no adenopathy, well-dressed, normal affect, normal respiratory effort. On examination of the right foot the transmetatarsal amputation is well-healed there is no ulceration no open area.  To the left foot he does have Wagner grade 1 ulcer to the first metatarsal head this is 2 cm in diameter with central ulceration which is 5 mm in diameter 3 mm deep does not probe to bone or tendon there is surrounding epiboly and maceration which was debrided with a 10 blade knife back to viable tissue callused ulceration to the third metatarsal head as well which was debrided with a 10 blade knife callus 2 cm in diameter there is no open area no erythema no warmth no sign of infection  Imaging: No results found. No images are attached to the encounter.  Labs: Lab Results  Component Value Date   HGBA1C 6.9 (A) 11/28/2021   HGBA1C 6.7 (A) 02/07/2021   HGBA1C 6.7 (A) 11/01/2020     Lab Results  Component Value Date    ALBUMIN 4.0 08/02/2020    Lab Results  Component Value Date   MG 2.6 (H) 04/04/2020   MG 2.6 (H) 04/04/2020   MG 2.9 (H) 04/03/2020   No results found for: "VD25OH"  No results found for: "PREALBUMIN"    Latest Ref Rng & Units 10/22/2021    8:24 AM 03/03/2021    8:50 AM 08/02/2020   11:46 AM  CBC EXTENDED  WBC 4.0 - 10.5 K/uL 8.0   8.0   RBC 4.22 - 5.81 MIL/uL 3.40   3.92   Hemoglobin 13.0 - 17.0 g/dL 62.1  30.8  65.7   HCT 39.0 - 52.0 % 32.3  36.0  34.9   Platelets 150 - 400 K/uL 307   237      There is no height or weight on file to calculate BMI.  Orders:  No orders of the defined types were placed in this encounter.  No orders of the defined types were placed in this encounter.    Procedures: No procedures performed  Clinical Data: No additional findings.  ROS:  All other systems negative, except as noted in the HPI. Review of Systems  Objective: Vital Signs: There were no vitals taken for this visit.  Specialty Comments:  No specialty comments available.  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   Coronary artery disease involving native coronary artery of native heart with unstable angina pectoris (HCC)    Acute congestive heart failure (HCC) 03/27/2020   Diabetic foot ulcers (HCC) 03/27/2020   DM2 (diabetes mellitus, type 2) (HCC) 03/27/2020   HTN (hypertension) 03/27/2020   Past Medical History:  Diagnosis Date   Blind right eye    Carotid artery disease (HCC)    04/01/20: 60-79% RICA stenosis, 1-39% LICA stenosis   Chronic kidney disease    ckd stage 3 a per dr Ronald Lobo 02-05-2021 epic   Coronary artery  disease    NSTEMI s/p CABG in 03/2020 (LIMA-->LAD/diag, L radial-->OM1/OM3); b.   DM type 2    Glaucoma of right eye, unspecified glaucoma type    HFrEF (heart failure with reduced ejection fraction) (HCC)    Echo 6/22: EF 45, global HK, mild MR, RVSP 31.9   Hypertension    Ischemic cardiomyopathy    PAD (peripheral artery disease) (HCC)    occluded right axillary artery 03/29/20     Family History  Problem Relation Age of Onset   Diabetes Mother     Past Surgical History:  Procedure Laterality Date   AMPUTATION Right 04/24/2020   Procedure: RIGHT 2ND AND 3RD TOE AMPUTATION;  Surgeon: Nadara Mustard, MD;  [facility];  Service: Orthopedics;  Laterality: Right;   AMPUTATION Right 10/22/2021   Procedure: RIGHT TRANSMETATARSAL AMPUTATION;  Surgeon: Nadara Mustard, MD;  Location: [facility] OR;  Service: Orthopedics;  Laterality: Right;   APPLICATION OF WOUND VAC Right 10/22/2021   Procedure: APPLICATION OF WOUND VAC;  Surgeon: Nadara Mustard, MD;  [facility];  Service: Orthopedics;  Laterality: Right;   CENTRAL VENOUS CATHETER INSERTION Left 04/03/2020   Procedure: INSERTION OF FEMORAL ARTERIAL LINE;  Surgeon: Linden Dolin, MD;  [facility];  Service: Open Heart Surgery;  Laterality: Left;   CORONARY ARTERY BYPASS GRAFT N/A 04/03/2020   Procedure: CORONARY ARTERY BYPASS GRAFTING (CABG) TIMES FOUR USING LEFT INTERNAL MAMMARY ARTERY AND LEFT RADIAL ARTERY.;  Surgeon: Linden Dolin, MD;  [facility];  Service: Open Heart Surgery;  Laterality: N/A;   CORONARY PRESSURE WIRE/FFR WITH 3D MAPPING N/A 03/29/2020   Procedure: Coronary Pressure Wire/FFR w/3D Mapping;  Surgeon: Lyn Records, MD;  [facility];  Service: Cardiovascular;  Laterality: N/A;   EYE SURGERY Right 01/21/2021   right eye cataract lens replacement surgery   IR THORACENTESIS ASP PLEURAL SPACE W/IMG GUIDE  05/03/2020   IR THORACENTESIS ASP PLEURAL SPACE W/IMG GUIDE  05/16/2020   LEFT HEART CATH  AND CORONARY ANGIOGRAPHY N/A 03/29/2020   Procedure: LEFT HEART CATH AND CORONARY ANGIOGRAPHY;  Surgeon: Lyn Records, MD;  [facility];  Service: Cardiovascular;  Laterality: N/A;   PENILE PROSTHESIS IMPLANT N/A 03/03/2021   Procedure: PENILE PROTHESIS INFLATABLE COLOPLAST;  Surgeon: Crista Elliot, MD;  Location: [facility];  Service: Urology;  Laterality: N/A;  REQUESTING 2 HRS   RADIAL ARTERY HARVEST Left 04/03/2020   Procedure: LEFT RADIAL ARTERY HARVEST;  Surgeon: Valentina Lucks  Z, MD;  Location: [facility];  Service: Open Heart Surgery;  Laterality: Left;   TEE WITHOUT CARDIOVERSION N/A 04/03/2020   Procedure: TRANSESOPHAGEAL ECHOCARDIOGRAM (TEE);  Surgeon: Linden Dolin, MD;  Location: [facility] OR;  Service: Open Heart Surgery;  Laterality: N/A;   UPPER EXTREMITY ANGIOGRAPHY  03/29/2020   Procedure: Upper Extremity Angiography;  Surgeon: Lyn Records, MD;  Location: [facility] INVASIVE CV LAB;  Service: Cardiovascular;;  rt.  arm   Social History   Occupational History   Occupation: retired d/t medical conditions  Tobacco Use   Smoking status: Every Day    Packs/day: 1.00    Years: 0.50    Total pack years: 0.50    Types: Cigarettes    Passive exposure: Current   Smokeless tobacco: Never  Vaping Use   Vaping Use: Never used  Substance and Sexual Activity   Alcohol use: Not Currently    Alcohol/week: 1.0 standard drink of alcohol    Types: 1 Cans of beer per week    Comment: occassionally beer   Drug use: Not Currently    Types: Marijuana    Comment: marijuana last use 3 xweek last used 02-23-2021   Sexual activity: Not Currently

## 2022-02-12 ENCOUNTER — Ambulatory Visit: Payer: Self-pay

## 2022-02-12 NOTE — Telephone Encounter (Signed)
Pt is requesting a refill of metroNIDAZOLE (FLAGYL) 500 MG tablet  Pt states he has Trichomonas .  But was not real comfortable talking about it, and said he hated to call, but he "has to".    Chief Complaint: Pt. Reports diagnosed with trichomonas in June,never took the prescribed medication. Asking if it can be sent to pharmacy again. Symptoms: White discharge from penis, mild burning Frequency: Months Pertinent Negatives: Patient denies fever Disposition: [] ED /[] Urgent Care (no appt availability in office) / [] Appointment(In office/virtual)/ []  [place] [facility]/ [] Home Care/ [] Refused Recommended Disposition /[] [place] [facility]/ [x]  Follow-up with PCP Additional Notes: Please advise pt.  Answer Assessment - Initial Assessment Questions 1. SYMPTOM: "What's the main symptom you're concerned about?" (e.g., discharge from penis, rash, pain, itching, swelling)     Discharge from penis, white 2. LOCATION: "Where is the  located?"     Outside 3. ONSET: "When did October  start?"     October 4. PAIN: "Is there any pain?" If Yes, ask: "How bad is it?"  (Scale 1-10; or mild, moderate, severe)     Mild 5. URINE: "Any difficulty passing urine?" If Yes, ask: "When was the last time?"     Burning 6. CAUSE: "What do you think is causing the symptoms?"     Trichomonas 7. OTHER SYMPTOMS: "Do you have any other symptoms?" (e.g., fever, abdomen pain, blood in urine)     NO  Protocols used: Penis and Scrotum Symptoms-A-AH

## 2022-02-13 ENCOUNTER — Other Ambulatory Visit: Payer: Self-pay

## 2022-02-13 MED ORDER — METOPROLOL SUCCINATE ER 25 MG PO TB24: 37.5000 mg | ORAL_TABLET | Freq: Every day | ORAL | 1 refills | Status: DC

## 2022-02-13 NOTE — Telephone Encounter (Signed)
Patient called and advise he need to be retested and he can go to UC. Due to office closed for the holiday.

## 2022-02-24 ENCOUNTER — Other Ambulatory Visit: Payer: Self-pay

## 2022-02-24 ENCOUNTER — Other Ambulatory Visit (HOSPITAL_COMMUNITY): Payer: Self-pay

## 2022-02-24 MED ORDER — LOSARTAN POTASSIUM 25 MG PO TABS
12.5000 mg | ORAL_TABLET | Freq: Every day | ORAL | 3 refills | Status: DC
  Filled 2022-02-24: qty 15, 30d supply, fill #0

## 2022-02-25 ENCOUNTER — Other Ambulatory Visit (HOSPITAL_COMMUNITY): Payer: Self-pay

## 2022-02-26 ENCOUNTER — Other Ambulatory Visit: Payer: Self-pay

## 2022-02-26 DIAGNOSIS — I5020 Unspecified systolic (congestive) heart failure: Secondary | ICD-10-CM

## 2022-02-26 DIAGNOSIS — I10 Essential (primary) hypertension: Secondary | ICD-10-CM

## 2022-02-26 DIAGNOSIS — Z720 Tobacco use: Secondary | ICD-10-CM

## 2022-02-26 DIAGNOSIS — I5022 Chronic systolic (congestive) heart failure: Secondary | ICD-10-CM

## 2022-02-26 DIAGNOSIS — N1831 Chronic kidney disease, stage 3a: Secondary | ICD-10-CM

## 2022-02-26 DIAGNOSIS — E1100 Type 2 diabetes mellitus with hyperosmolarity without nonketotic hyperglycemic-hyperosmolar coma (NKHHC): Secondary | ICD-10-CM

## 2022-02-26 DIAGNOSIS — I779 Disorder of arteries and arterioles, unspecified: Secondary | ICD-10-CM

## 2022-02-26 DIAGNOSIS — I2510 Atherosclerotic heart disease of native coronary artery without angina pectoris: Secondary | ICD-10-CM

## 2022-02-26 MED ORDER — FUROSEMIDE 20 MG PO TABS: 20.0000 mg | ORAL_TABLET | ORAL | 3 refills | Status: DC

## 2022-03-05 ENCOUNTER — Encounter (HOSPITAL_COMMUNITY): Payer: Self-pay

## 2022-03-05 ENCOUNTER — Ambulatory Visit (HOSPITAL_COMMUNITY)
Admission: EM | Admit: 2022-03-05 | Discharge: 2022-03-05 | Disposition: A | Discharge status: Home / Self Care | Payer: Medicaid Other | Attending: Family Medicine | Admitting: Family Medicine

## 2022-03-05 ENCOUNTER — Emergency Department (HOSPITAL_COMMUNITY): Payer: 59

## 2022-03-05 ENCOUNTER — Encounter (HOSPITAL_COMMUNITY): Payer: Self-pay | Admitting: Emergency Medicine

## 2022-03-05 ENCOUNTER — Observation Stay (HOSPITAL_COMMUNITY)
Admission: EM | Admit: 2022-03-05 | Discharge: 2022-03-06 | Disposition: A | Discharge status: Home / Self Care | Payer: 59 | Attending: Internal Medicine | Admitting: Internal Medicine

## 2022-03-05 ENCOUNTER — Other Ambulatory Visit: Payer: Self-pay

## 2022-03-05 DIAGNOSIS — N1832 Chronic kidney disease, stage 3b: Secondary | ICD-10-CM | POA: Diagnosis not present

## 2022-03-05 DIAGNOSIS — E1122 Type 2 diabetes mellitus with diabetic chronic kidney disease: Secondary | ICD-10-CM | POA: Insufficient documentation

## 2022-03-05 DIAGNOSIS — F17210 Nicotine dependence, cigarettes, uncomplicated: Secondary | ICD-10-CM | POA: Insufficient documentation

## 2022-03-05 DIAGNOSIS — I25110 Atherosclerotic heart disease of native coronary artery with unstable angina pectoris: Secondary | ICD-10-CM | POA: Diagnosis present

## 2022-03-05 DIAGNOSIS — I779 Disorder of arteries and arterioles, unspecified: Secondary | ICD-10-CM

## 2022-03-05 DIAGNOSIS — R7989 Other specified abnormal findings of blood chemistry: Secondary | ICD-10-CM | POA: Insufficient documentation

## 2022-03-05 DIAGNOSIS — Z79899 Other long term (current) drug therapy: Secondary | ICD-10-CM | POA: Diagnosis not present

## 2022-03-05 DIAGNOSIS — Z720 Tobacco use: Secondary | ICD-10-CM

## 2022-03-05 DIAGNOSIS — I130 Hypertensive heart and chronic kidney disease with heart failure and stage 1 through stage 4 chronic kidney disease, or unspecified chronic kidney disease: Secondary | ICD-10-CM | POA: Insufficient documentation

## 2022-03-05 DIAGNOSIS — J101 Influenza due to other identified influenza virus with other respiratory manifestations: Secondary | ICD-10-CM | POA: Diagnosis not present

## 2022-03-05 DIAGNOSIS — R112 Nausea with vomiting, unspecified: Secondary | ICD-10-CM | POA: Insufficient documentation

## 2022-03-05 DIAGNOSIS — R0902 Hypoxemia: Secondary | ICD-10-CM

## 2022-03-05 DIAGNOSIS — I959 Hypotension, unspecified: Secondary | ICD-10-CM

## 2022-03-05 DIAGNOSIS — Z1152 Encounter for screening for COVID-19: Secondary | ICD-10-CM | POA: Diagnosis not present

## 2022-03-05 DIAGNOSIS — Z7984 Long term (current) use of oral hypoglycemic drugs: Secondary | ICD-10-CM | POA: Insufficient documentation

## 2022-03-05 DIAGNOSIS — R531 Weakness: Principal | ICD-10-CM

## 2022-03-05 DIAGNOSIS — I2510 Atherosclerotic heart disease of native coronary artery without angina pectoris: Secondary | ICD-10-CM | POA: Diagnosis not present

## 2022-03-05 DIAGNOSIS — E119 Type 2 diabetes mellitus without complications: Secondary | ICD-10-CM

## 2022-03-05 DIAGNOSIS — J111 Influenza due to unidentified influenza virus with other respiratory manifestations: Secondary | ICD-10-CM | POA: Diagnosis present

## 2022-03-05 DIAGNOSIS — I5020 Unspecified systolic (congestive) heart failure: Secondary | ICD-10-CM

## 2022-03-05 DIAGNOSIS — N1831 Chronic kidney disease, stage 3a: Secondary | ICD-10-CM

## 2022-03-05 DIAGNOSIS — I214 Non-ST elevation (NSTEMI) myocardial infarction: Secondary | ICD-10-CM | POA: Diagnosis not present

## 2022-03-05 DIAGNOSIS — I5022 Chronic systolic (congestive) heart failure: Secondary | ICD-10-CM | POA: Insufficient documentation

## 2022-03-05 DIAGNOSIS — Z951 Presence of aortocoronary bypass graft: Secondary | ICD-10-CM | POA: Insufficient documentation

## 2022-03-05 DIAGNOSIS — E1100 Type 2 diabetes mellitus with hyperosmolarity without nonketotic hyperglycemic-hyperosmolar coma (NKHHC): Secondary | ICD-10-CM

## 2022-03-05 DIAGNOSIS — I10 Essential (primary) hypertension: Secondary | ICD-10-CM

## 2022-03-05 LAB — BASIC METABOLIC PANEL
Anion gap: 12 (ref 5–15)
BUN: 32 mg/dL — ABNORMAL HIGH (ref 8–23)
CO2: 18 mmol/L — ABNORMAL LOW (ref 22–32)
Calcium: 8.1 mg/dL — ABNORMAL LOW (ref 8.9–10.3)
Chloride: 104 mmol/L (ref 98–111)
Creatinine, Ser: 1.5 mg/dL — ABNORMAL HIGH (ref 0.61–1.24)
GFR, Estimated: 51 mL/min — ABNORMAL LOW (ref >60)
Glucose, Bld: 123 mg/dL — ABNORMAL HIGH (ref 70–99)
Potassium: 4.5 mmol/L (ref 3.5–5.1)
Sodium: 134 mmol/L — ABNORMAL LOW (ref 135–145)

## 2022-03-05 LAB — RAPID URINE DRUG SCREEN, HOSP PERFORMED
Amphetamines: NOT DETECTED
Barbiturates: NOT DETECTED
Benzodiazepines: NOT DETECTED
Cocaine: NOT DETECTED
Opiates: NOT DETECTED
Tetrahydrocannabinol: POSITIVE — AB

## 2022-03-05 LAB — CBC
HCT: 34.2 % — ABNORMAL LOW (ref 39.0–52.0)
Hemoglobin: 11.8 g/dL — ABNORMAL LOW (ref 13.0–17.0)
MCH: 31.1 pg (ref 26.0–34.0)
MCHC: 34.5 g/dL (ref 30.0–36.0)
MCV: 90 fL (ref 80.0–100.0)
Platelets: 94 10*3/uL — ABNORMAL LOW (ref 150–400)
RBC: 3.8 MIL/uL — ABNORMAL LOW (ref 4.22–5.81)
RDW: 13.3 % (ref 11.5–15.5)
WBC: 10.6 10*3/uL — ABNORMAL HIGH (ref 4.0–10.5)
nRBC: 0 % (ref 0.0–0.2)

## 2022-03-05 LAB — CBG MONITORING, ED: Glucose-Capillary: 129 mg/dL — ABNORMAL HIGH (ref 70–99)

## 2022-03-05 LAB — TROPONIN I (HIGH SENSITIVITY)
Troponin I (High Sensitivity): 25 ng/L — ABNORMAL HIGH (ref <18)
Troponin I (High Sensitivity): 137 ng/L (ref <18)

## 2022-03-05 LAB — RESP PANEL BY RT-PCR (RSV, FLU A&B, COVID)  RVPGX2
Influenza A by PCR: POSITIVE — AB
Influenza B by PCR: NEGATIVE
Resp Syncytial Virus by PCR: NEGATIVE
SARS Coronavirus 2 by RT PCR: NEGATIVE

## 2022-03-05 MED ORDER — ACETAMINOPHEN 500 MG PO TABS
1000.0000 mg | ORAL_TABLET | Freq: Once | ORAL | Status: AC
  Administered 2022-03-05: 1000 mg via ORAL
  Filled 2022-03-05: qty 2

## 2022-03-05 MED ORDER — LACTATED RINGERS IV BOLUS
1000.0000 mL | Freq: Once | INTRAVENOUS | Status: AC
  Administered 2022-03-05: 1000 mL via INTRAVENOUS

## 2022-03-05 MED ORDER — ASPIRIN 81 MG PO CHEW
324.0000 mg | CHEWABLE_TABLET | Freq: Once | ORAL | Status: AC
  Administered 2022-03-05: 324 mg via ORAL
  Filled 2022-03-05: qty 4

## 2022-03-05 MED ORDER — HEPARIN BOLUS VIA INFUSION
4000.0000 [IU] | Freq: Once | INTRAVENOUS | Status: AC
  Administered 2022-03-05: 4000 [IU] via INTRAVENOUS
  Filled 2022-03-05: qty 4000

## 2022-03-05 MED ORDER — ONDANSETRON HCL 4 MG/2ML IJ SOLN
4.0000 mg | Freq: Once | INTRAMUSCULAR | Status: AC
  Administered 2022-03-05: 4 mg via INTRAVENOUS
  Filled 2022-03-05: qty 2

## 2022-03-05 MED ORDER — SODIUM CHLORIDE 0.9 % IV SOLN: Freq: Once | INTRAVENOUS | Status: DC

## 2022-03-05 MED ORDER — HEPARIN (PORCINE) 25000 UT/250ML-% IV SOLN
900.0000 [IU]/h | INTRAVENOUS | Status: DC
  Administered 2022-03-05: 900 [IU]/h via INTRAVENOUS
  Filled 2022-03-05: qty 250

## 2022-03-05 NOTE — ED Provider Triage Note (Signed)
Emergency Medicine Provider Triage Evaluation Note  Curtis Watkins , a 67 y.o. male  was evaluated in triage.  Pt complains of chest congestion, fatigue, nausea, decreased appetite, body aches, generalized abdominal pain, "feeling bad."  He was initially seen at urgent care and found to be hypoxic and hypotensive.  Transferred via ambulance here.  On the way, he reported brief chest pain.  Symptoms have been present for the past 3 weeks and have gotten worse this last week.  Denies history of similar.  Denies shortness of breath or current chest pain  Review of Systems  Positive: As above Negative: As above  Physical Exam  BP 114/69 (BP Location: Left Arm)   Pulse 76   Temp 100.1 F (37.8 C)   Resp 20   SpO2 100%  Gen:   Awake, no distress   Resp:  Normal effort  MSK:   Moves extremities without difficulty  Other:  No abdominal tenderness to palpation  Medical Decision Making  Medically screening exam initiated at 5:06 PM.  Appropriate orders placed.  Curtis Watkins was informed that the remainder of the evaluation will be completed by another provider, this initial triage assessment does not replace that evaluation, and the importance of remaining in the ED until their evaluation is complete.  History of CAD.  ACS rule out initiated   Curtis Watkins, Curtis Watkins 03/05/22 1708

## 2022-03-05 NOTE — Progress Notes (Signed)
ANTICOAGULATION CONSULT NOTE - Initial Consult  Pharmacy Consult for heparin  Indication: chest pain/ACS  No Known Allergies   Vital Signs: Temp: 99 F (37.2 C) (01/11 2130) Temp Source: Oral (01/11 1818) BP: 97/68 (01/11 2245) Pulse Rate: 68 (01/11 2245)  Labs: Recent Labs    03/05/22 1830 03/05/22 2021  HGB  --  11.8*  HCT  --  34.2*  PLT  --  94*  CREATININE  --  1.50*  TROPONINIHS 25* 137*     CrCl cannot be calculated (Unknown ideal weight.).   Medical History: Past Medical History:  Diagnosis Date   Blind right eye    Carotid artery disease ([place])    04/01/20: 60-79% RICA stenosis, 7-20% LICA stenosis   Chronic kidney disease    ckd stage 3 a per dr Malva Limes 02-05-2021 epic   Coronary artery disease    NSTEMI s/p CABG in 03/2020 (LIMA-->LAD/diag, L radial-->OM1/OM3); b.   DM type 2    Glaucoma of right eye, unspecified glaucoma type    HFrEF (heart failure with reduced ejection fraction) ([place])    Echo 6/22: EF 45, global HK, mild MR, RVSP 31.9   Hypertension    Ischemic cardiomyopathy    PAD (peripheral artery disease) ([place])    occluded right axillary artery 03/29/20     Assessment: Presents to ED with chest pain. Concern for ACS. No AC PTA. Pharmacy consulted to dose heparin.   Goal of Therapy:  Heparin level 0.3-0.7 units/ml Monitor platelets by anticoagulation protocol: Yes   Plan:  Give heparin 4000 units IV x1  Start heparin infusion at 900 units/hr  Will f/u heparin level in 6 hours Monitor daily heparin level and CBC Continue to monitor H&H    Gena Fray, PharmD PGY1 Pharmacy Resident   03/05/2022 11:16 PM

## 2022-03-05 NOTE — ED Notes (Signed)
Repeat labs sent at this time. Patient resting and reports no complaints at this time. Call bell and room phone in reach patient repositioned

## 2022-03-05 NOTE — Discharge Instructions (Addendum)
Patient will be transported to the emergency room for further evaluation and treatment

## 2022-03-05 NOTE — ED Notes (Signed)
Troponin of 137 reported to Dr Wynona Dove at this time

## 2022-03-05 NOTE — ED Triage Notes (Signed)
Pt transferred from UC for hypoxia and hypotension. Pt having weakness and nauseax3d. Per [facility] VSS for them. Pt c/o int chest pain per [facility]

## 2022-03-05 NOTE — ED Provider Notes (Signed)
[facility] EMERGENCY DEPARTMENT Provider Note   CSN: [id number] Arrival date & time: 03/05/22  1623     History  Chief Complaint  Patient presents with   Weakness   Chest Pain   Nausea    Curtis Watkins is a 67 y.o. male.  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  Presents to the emergency department today as a transfer from urgent care where he initially presented for weakness, nausea, chest congestion, and intermittent chest pain.  Has had about 2 weeks of chest congestion and over the past week or so has felt just progressively weaker and more fatigued.  He presented to urgent care prior to being transferred to the emergency department. At urgent care initial blood pressure was low and reportedly initial oxygen saturation was between 74 and 80% although this was never charted.  He was reportedly placed on 2 L of oxygen with improvement to 100% and had a nonischemic EKG.  Systolic blood pressure there was 86/56 on arrival.  Patient states that he was never put on oxygen, never had a low oxygen level at the urgent care.  He admits to some myalgias, as well as overall just generalized malaise.  Has not missed any doses for his diabetes or his cardiac problems.  No shortness of breath.  No leg swelling.  No sick contacts.  No vomiting or diarrhea but has had nausea today.  States that he had not had anything to eat or drink at all today, and when he was nauseous there is nothing for him to throw up.   Weakness Associated symptoms: chest pain   Chest Pain Associated symptoms: weakness        Home Medications Prior to Admission medications   Medication Sig Start Date End Date Taking? Authorizing Provider  aspirin EC 325 MG tablet Take 162.5 mg by mouth daily.   Yes [provider]  atorvastatin (LIPITOR) 80 MG tablet  Take 1 tablet (80 mg total) by mouth daily. 01/06/22  Yes Freada Bergeron, MD  brimonidine (ALPHAGAN) 0.2 % ophthalmic solution Place 1 drop into the left eye 2 (two) times daily. 03/20/21  Yes   dorzolamide-timolol (COSOPT) 22.3-6.8 MG/ML ophthalmic solution Place 1 drop into the left eye 2 (two) times daily. 03/20/21  Yes   empagliflozin (JARDIANCE) 25 MG TABS tablet Take 1 tablet (25 mg total) by mouth daily before breakfast. 01/06/22  Yes Pemberton, Greer Ee, MD  furosemide (LASIX) 20 MG tablet Take 1 tablet (20 mg total) by mouth 3 (three) times a week. Take on Mondays, Wednesdays, and Fridays. 02/27/22  Yes Freada Bergeron, MD  losartan (COZAAR) 25 MG tablet Take 0.5 tablets (12.5 mg total) by mouth daily. 02/24/22  Yes Freada Bergeron, MD  metoprolol succinate (TOPROL XL) 25 MG 24 hr tablet Take 1.5 tablets (37.5 mg total) by mouth daily. 02/13/22  Yes Freada Bergeron, MD  spironolactone (ALDACTONE) 25 MG tablet Take 0.5 tablets (12.5 mg total) by mouth daily. 01/05/22  Yes Freada Bergeron, MD  blood glucose meter kit and supplies by Other route as directed. Dispense based on patient and insurance preference. Use up to four times daily as directed. (FOR ICD-10 E10.9, E11.9).    [provider]  HYDROcodone-acetaminophen (NORCO/VICODIN) 5-325 MG tablet Take 1 tablet by mouth every 4 (four) hours as needed. Patient not  taking: Reported on 01/06/2022 10/22/21   Nadara Mustard, MD  Semaglutide,0.25 or 0.5MG /DOS, (OZEMPIC, 0.25 OR 0.5 MG/DOSE,) 2 MG/3ML SOPN Inject 0.5 mg into the skin once a week. Patient not taking: Reported on 01/06/2022 10/09/21   Shamleffer, Konrad Dolores, MD      Allergies    Patient has no known allergies.    Review of Systems   Review of Systems  Cardiovascular:  Positive for chest pain.  Neurological:  Positive for weakness.    Physical Exam Updated Vital Signs BP (!) 127/92   Pulse 65   Temp 99 F (37.2 C)   Resp 20   SpO2 100%   Physical Exam Vitals and nursing note reviewed.  Constitutional:      General: He is not in acute distress.    Appearance: He is well-developed.  HENT:     Head: Normocephalic and atraumatic.  Eyes:     Conjunctiva/sclera: Conjunctivae normal.  Cardiovascular:     Rate and Rhythm: Normal rate and regular rhythm.     Pulses:          Radial pulses are 2+ on the right side and 2+ on the left side.     Heart sounds: Normal heart sounds. No murmur heard. Pulmonary:     Effort: Pulmonary effort is normal. No respiratory distress.     Breath sounds: Normal breath sounds. No decreased breath sounds, wheezing, rhonchi or rales.  Abdominal:     Palpations: Abdomen is soft.     Tenderness: There is no abdominal tenderness.  Musculoskeletal:        General: No swelling.     Cervical back: Neck supple.     Right lower leg: No tenderness. No edema.     Left lower leg: No tenderness. No edema.  Skin:    General: Skin is warm and dry.     Capillary Refill: Capillary refill takes less than 2 seconds.  Neurological:     General: No focal deficit present.     Mental Status: He is alert and oriented to person, place, and time.  Psychiatric:        Mood and Affect: Mood normal.     ED Results / Procedures / Treatments   Labs (all labs ordered are listed, but only abnormal results are displayed) Labs Reviewed  RESP PANEL BY RT-PCR (RSV, FLU A&B, COVID)  RVPGX2 - Abnormal; Notable for the following components:      Result Value   Influenza A by PCR POSITIVE (*)    All other components within normal limits  BASIC METABOLIC PANEL - Abnormal; Notable for the following components:   Sodium 134 (*)    CO2 18 (*)    Glucose, Bld 123 (*)    BUN 32 (*)    Creatinine, Ser 1.50 (*)    Calcium 8.1 (*)    GFR, Estimated 51 (*)    All other components within normal limits  CBC - Abnormal; Notable for the following components:   WBC 10.6 (*)    RBC 3.80 (*)    Hemoglobin 11.8 (*)    HCT 34.2  (*)    Platelets 94 (*)    All other components within normal limits  RAPID URINE DRUG SCREEN, HOSP PERFORMED - Abnormal; Notable for the following components:   Tetrahydrocannabinol POSITIVE (*)    All other components within normal limits  TROPONIN I (HIGH SENSITIVITY) - Abnormal; Notable for the following components:   Troponin I (High Sensitivity) 137 (*)  All other components within normal limits  TROPONIN I (HIGH SENSITIVITY) - Abnormal; Notable for the following components:   Troponin I (High Sensitivity) 25 (*)    All other components within normal limits  URINALYSIS, COMPLETE (UACMP) WITH MICROSCOPIC    EKG EKG Interpretation  Date/Time:  Thursday March 05 2022 16:29:48 EST Ventricular Rate:  78 PR Interval:  132 QRS Duration: 78 QT Interval:  380 QTC Calculation: 433 R Axis:   56 Text Interpretation: Normal sinus rhythm Minimal voltage criteria for LVH, may be normal variant ( Sokolow-Lyon ) Borderline ECG T-waves now upright inferiorly and laterally Confirmed by Leanord Asal ([id number]) on 03/05/2022 8:32:19 PM  Radiology DG Chest 2 View  Result Date: 03/05/2022 CLINICAL DATA:  Chest pain.  Hypoxia and hypotension EXAM: CHEST - 2 VIEW COMPARISON:  06/20/2020 x-ray FINDINGS: Sternal wires. The right inferior costophrenic angles cut off the edge of the film. Mild left basilar scar or atelectasis. No pneumothorax or edema. Normal cardiopericardial silhouette. Degenerative changes along the spine IMPRESSION: Postop chest.  Mild basilar scar or atelectasis. The right inferior costophrenic angles clipped at the edge of the film. Electronically Signed   By: Jill Side M.D.   On: 03/05/2022 17:38    Procedures Procedures    Medications Ordered in ED Medications  ondansetron (ZOFRAN) injection 4 mg (4 mg Intravenous Given 03/05/22 1851)  lactated ringers bolus 1,000 mL (0 mLs Intravenous Stopped 03/05/22 1929)  acetaminophen (TYLENOL) tablet 1,000 mg (1,000 mg Oral Given  03/05/22 1851)  aspirin chewable tablet 324 mg (324 mg Oral Given 03/05/22 2157)    ED Course/ Medical Decision Making/ A&P                           Medical Decision Making Problems Addressed: NSTEMI (non-ST elevated myocardial infarction) [facility]): acute illness or injury that poses a threat to life or bodily functions Weakness: acute illness or injury that poses a threat to life or bodily functions  Amount and/or Complexity of Data Reviewed External Data Reviewed: notes.    Details: Notes from urgent care earlier today Labs: ordered. Decision-making details documented in ED Course. Radiology: ordered and independent interpretation performed. Decision-making details documented in ED Course. ECG/medicine tests: ordered and independent interpretation performed. Decision-making details documented in ED Course. Discussion of management or test interpretation with external provider(s): Cardiology and hospitalist  Risk OTC drugs. Prescription drug management. Decision regarding hospitalization.   This is a 67 y.o. male who presents to the emergency department with weakness, fatigue, nausea, generalized malaise. History is obtained from patient, urgent care notes reviewed. Medical history reviewed in EMR.  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  He is febrile to 101 F, but other vital signs are within normal limits.  Initial blood pressure is 106/66, no tachycardia, normal respiratory effort on room air with good oxygen saturations.  Given his constellation of symptoms without any focal localizing symptoms, believe it is most likely viral.  However will obtain chest x-ray to rule out  pneumonia or other cardiopulmonary cause.  At this time, on arrival he does not meet SIRS criteria, as he only has temperature 101 F.  At this time, will not obtain lactic  acid or blood cultures, unless other lab studies change or become indicative of sepsis.  Patient is not immunocompromised and has no other risk factors for underlying infection such as IV drug use.  Initial Plan: Laboratory evaluation with CBC, BMP, troponin, urinalysis, COVID/flu Chest x-ray ECG Therapeutic interventions with 1 g of Tylenol for antibiotic effect, 4 mg IV Zofran, 1 L LR  Interpretation of Diagnostics: I personally reviewed the labs, x-ray, ECG and my interpretation is as follows: X-ray per my review shows no signs of acute cardiopulmonary abnormality.  There is no evidence of consolidations, opacities, pleural effusion, pulmonary edema, or pneumothorax.  ECG per my review shows evidence of LVH, but normal sinus rate and rhythm and no other indication of acute ischemia or infarct.  Labs show Positive for influenza A.  Initial troponin is 25.  Second troponin elevated 137.  Bedside point-of-care ultrasound shows no pericardial effusion, mildly decreased ejection fraction, no signs of acute heart failure, flat IVC.  Patient was given aspirin 324 mg chewable tablets.  Cardiology consulted for sharp rise in troponin, they are going to see and evaluate the patient, recommending starting heparin with a PTT goal of 50-70.  They are recommending admission to hospital medicine and they will follow along.  MDM generated using voice dictation software and may contain dictation errors. Please contact me for any clarification or with any questions.          Final Clinical Impression(s) / ED Diagnoses Final diagnoses:  Weakness  Nausea and vomiting, unspecified vomiting type  Influenza A  NSTEMI (non-ST elevated myocardial infarction) [facility])    Rx / DC Orders ED Discharge Orders     None         Gust Brooms, MD 03/05/22 2242    Elayne Snare K, DO 03/05/22 2315

## 2022-03-05 NOTE — ED Notes (Signed)
Patient has left the building going to ED via [facility]

## 2022-03-05 NOTE — ED Provider Notes (Signed)
Patient seen in triage.  He has had about 2 weeks of chest congestion.  And then for the last 7 to 10 days he is felt weak.  He comes in today because his weakness increased.  And he feels very tired.  He has had some nausea but no vomiting.  He does have a history of coronary artery disease, diabetes, and heart failure.  On initial vital signs blood pressure was 86/56.  Patient stated this is the way it always runs.  On review of the flowsheets in epic, this is incorrect.  His systolic is often 161W to 130s.  Also on initial vital signs his O2 sat was 74 to 80%.  It improved to 100% on 2 L of oxygen. EKG did not show any acute abnormality.  He is to be sent by [facility] to the emergency room for further evaluation and treatment.    Barrett Henle, MD 03/05/22 [phone]

## 2022-03-05 NOTE — ED Triage Notes (Addendum)
Reports feeling weakness, nausea for 3 days, intermittent chest pain.  Patient is answering questions , appears tired, yawning.  Patient does follow commands.  Denies chest pain currently, repeatedly says he is weak and intermittently moaning.  Skin warm and dry.  Curtis Watkins x 4 .

## 2022-03-05 NOTE — ED Notes (Signed)
Patient ambulates by self without assistance HR 79 02 99% while ambulating. Patient reports no complaints or problems while ambulating. Provider made aware

## 2022-03-05 NOTE — ED Notes (Signed)
Lab reports that no labs have been sent down on this patient and they will need a recollect the labs ordered.

## 2022-03-06 ENCOUNTER — Inpatient Hospital Stay (HOSPITAL_BASED_OUTPATIENT_CLINIC_OR_DEPARTMENT_OTHER): Payer: 59

## 2022-03-06 ENCOUNTER — Other Ambulatory Visit (HOSPITAL_COMMUNITY): Payer: Self-pay

## 2022-03-06 DIAGNOSIS — R7989 Other specified abnormal findings of blood chemistry: Secondary | ICD-10-CM | POA: Insufficient documentation

## 2022-03-06 DIAGNOSIS — I25110 Atherosclerotic heart disease of native coronary artery with unstable angina pectoris: Secondary | ICD-10-CM

## 2022-03-06 DIAGNOSIS — J111 Influenza due to unidentified influenza virus with other respiratory manifestations: Secondary | ICD-10-CM

## 2022-03-06 DIAGNOSIS — R531 Weakness: Secondary | ICD-10-CM | POA: Diagnosis not present

## 2022-03-06 DIAGNOSIS — I255 Ischemic cardiomyopathy: Secondary | ICD-10-CM

## 2022-03-06 DIAGNOSIS — I2489 Other forms of acute ischemic heart disease: Secondary | ICD-10-CM

## 2022-03-06 DIAGNOSIS — I214 Non-ST elevation (NSTEMI) myocardial infarction: Secondary | ICD-10-CM

## 2022-03-06 LAB — ECHOCARDIOGRAM COMPLETE
AR max vel: 2.66 cm2
AV Area VTI: 2.61 cm2
AV Area mean vel: 2.5 cm2
AV Mean grad: 2 mmHg
AV Peak grad: 4.5 mmHg
Ao pk vel: 1.06 m/s
Area-P 1/2: 3.91 cm2
Calc EF: 40.8 %
MV M vel: 5.56 m/s
MV Peak grad: 123.8 mmHg
S' Lateral: 3.5 cm
Single Plane A2C EF: 34 %
Single Plane A4C EF: 48.2 %

## 2022-03-06 LAB — CBC
HCT: 38.9 % — ABNORMAL LOW (ref 39.0–52.0)
Hemoglobin: 13.4 g/dL (ref 13.0–17.0)
MCH: 31 pg (ref 26.0–34.0)
MCHC: 34.4 g/dL (ref 30.0–36.0)
MCV: 90 fL (ref 80.0–100.0)
Platelets: 118 10*3/uL — ABNORMAL LOW (ref 150–400)
RBC: 4.32 MIL/uL (ref 4.22–5.81)
RDW: 13.2 % (ref 11.5–15.5)
WBC: 11.3 10*3/uL — ABNORMAL HIGH (ref 4.0–10.5)
nRBC: 0 % (ref 0.0–0.2)

## 2022-03-06 LAB — TROPONIN I (HIGH SENSITIVITY): Troponin I (High Sensitivity): 160 ng/L (ref <18)

## 2022-03-06 LAB — HEPARIN LEVEL (UNFRACTIONATED)
Heparin Unfractionated: 1.01 IU/mL — ABNORMAL HIGH (ref 0.30–0.70)
Heparin Unfractionated: 0.63 IU/mL (ref 0.30–0.70)

## 2022-03-06 MED ORDER — LACTATED RINGERS IV SOLN: INTRAVENOUS | Status: DC

## 2022-03-06 MED ORDER — EMPAGLIFLOZIN 25 MG PO TABS: 25.0000 mg | ORAL_TABLET | Freq: Every day | ORAL | 2 refills | Status: DC

## 2022-03-06 MED ORDER — ASPIRIN 81 MG PO TBEC
162.5000 mg | DELAYED_RELEASE_TABLET | Freq: Every day | ORAL | Status: DC
  Administered 2022-03-06: 162.5 mg via ORAL
  Filled 2022-03-06: qty 2

## 2022-03-06 MED ORDER — HEPARIN (PORCINE) 25000 UT/250ML-% IV SOLN
800.0000 [IU]/h | INTRAVENOUS | Status: DC
  Administered 2022-03-06: 800 [IU]/h via INTRAVENOUS

## 2022-03-06 MED ORDER — OSELTAMIVIR PHOSPHATE 30 MG PO CAPS
30.0000 mg | ORAL_CAPSULE | Freq: Two times a day (BID) | ORAL | Status: DC
  Administered 2022-03-06 (×2): 30 mg via ORAL
  Filled 2022-03-06 (×4): qty 1

## 2022-03-06 MED ORDER — LOSARTAN POTASSIUM 25 MG PO TABS: 12.5000 mg | ORAL_TABLET | Freq: Every day | ORAL | 3 refills | Status: DC

## 2022-03-06 MED ORDER — SPIRONOLACTONE 25 MG PO TABS: 12.5000 mg | ORAL_TABLET | Freq: Every day | ORAL | 0 refills | Status: DC

## 2022-03-06 MED ORDER — OSELTAMIVIR PHOSPHATE 30 MG PO CAPS
30.0000 mg | ORAL_CAPSULE | Freq: Two times a day (BID) | ORAL | 0 refills | Status: DC
  Filled 2022-03-06: qty 9, 5d supply, fill #0

## 2022-03-06 MED ORDER — METOPROLOL SUCCINATE ER 25 MG PO TB24: 37.5000 mg | ORAL_TABLET | Freq: Every day | ORAL | 1 refills | Status: DC

## 2022-03-06 MED ORDER — FUROSEMIDE 20 MG PO TABS: 20.0000 mg | ORAL_TABLET | ORAL | Status: DC

## 2022-03-06 MED ORDER — ATORVASTATIN CALCIUM 40 MG PO TABS
80.0000 mg | ORAL_TABLET | Freq: Every day | ORAL | Status: DC
  Administered 2022-03-06: 80 mg via ORAL
  Filled 2022-03-06: qty 2

## 2022-03-06 MED ORDER — FUROSEMIDE 20 MG PO TABS: 20.0000 mg | ORAL_TABLET | ORAL | 3 refills | Status: DC

## 2022-03-06 NOTE — Progress Notes (Signed)
ANTICOAGULATION CONSULT NOTE   Pharmacy Consult for heparin  Indication: chest pain/ACS  No Known Allergies   Vital Signs: Temp: 98.5 F (36.9 C) (01/12 0255) Temp Source: Oral (01/12 0255) BP: 102/68 (01/12 0430) Pulse Rate: 73 (01/12 0430)  Labs: Recent Labs    03/05/22 1830 03/05/22 2021 03/06/22 0434  HGB  --  11.8* 13.4  HCT  --  34.2* 38.9*  PLT  --  94* 118*  HEPARINUNFRC  --   --  1.01*  CREATININE  --  1.50*  --   TROPONINIHS 25* 137*  --      CrCl cannot be calculated (Unknown ideal weight.).   Medical History: Past Medical History:  Diagnosis Date   Blind right eye    Carotid artery disease ([place])    04/01/20: 60-79% RICA stenosis, 8-14% LICA stenosis   Chronic kidney disease    ckd stage 3 a per dr Malva Limes 02-05-2021 epic   Coronary artery disease    NSTEMI s/p CABG in 03/2020 (LIMA-->LAD/diag, L radial-->OM1/OM3); b.   DM type 2    Glaucoma of right eye, unspecified glaucoma type    HFrEF (heart failure with reduced ejection fraction) ([place])    Echo 6/22: EF 45, global HK, mild MR, RVSP 31.9   Hypertension    Ischemic cardiomyopathy    PAD (peripheral artery disease) ([place])    occluded right axillary artery 03/29/20     Assessment: Presents to ED with chest pain. Concern for ACS. No AC PTA. Pharmacy consulted to dose heparin.   1/12 AM update:  Heparin level elevated  Goal of Therapy:  Heparin level 0.3-0.7 units/ml Monitor platelets by anticoagulation protocol: Yes   Plan:  Hold heparin x 1 hr Re-start heparin at 800 units/hr at 0630 1300 heparin level  Narda Bonds, PharmD, [place] Pharmacist Phone: [phone]

## 2022-03-06 NOTE — ED Notes (Signed)
ED TO INPATIENT HANDOFF REPORT  ED Nurse Name and Phone #: Lind Covert Name/Age/Gender Curtis Watkins 67 y.o. male Room/Bed: 007C/007C  Code Status   Code Status: Prior  Home/SNF/Other Home Patient oriented to: self, place, time, and situation Is this baseline? Yes   Triage Complete: Triage complete  Chief Complaint Generalized weakness [R53.1]  Triage Note Pt transferred from UC for hypoxia and hypotension. Pt having weakness and nauseax3d. Per [facility] VSS for them. Pt c/o int chest pain per [facility]   Allergies No Known Allergies  Level of Care/Admitting Diagnosis ED Disposition     ED Disposition  Admit   Condition  --   Comment  Hospital Area: [facility] [[id number]]  Level of Care: Telemetry Cardiac [[id number]]  May admit patient to [name] or [facility] if equivalent level of care is available:: No  Covid Evaluation: Asymptomatic - no recent exposure (last 10 days) testing not required  Diagnosis: Generalized weakness [709628]  Admitting Physician: Darlin Drop [[id number]]  Attending Physician: Darlin Drop [[id number]]  Certification:: I certify this patient will need inpatient services for at least 2 midnights  Estimated Length of Stay: 2          B Medical/Surgery History Past Medical History:  Diagnosis Date   Blind right eye    Carotid artery disease (HCC)    04/01/20: 60-79% RICA stenosis, 1-39% LICA stenosis   Chronic kidney disease    ckd stage 3 a per dr Ronald Lobo 02-05-2021 epic   Coronary artery disease    NSTEMI s/p CABG in 03/2020 (LIMA-->LAD/diag, L radial-->OM1/OM3); b.   DM type 2    Glaucoma of right eye, unspecified glaucoma type    HFrEF (heart failure with reduced ejection fraction) (HCC)    Echo 6/22: EF 45, global HK, mild MR, RVSP 31.9   Hypertension    Ischemic cardiomyopathy    PAD (peripheral artery disease) (HCC)    occluded right axillary artery 03/29/20    Past Surgical History:  Procedure Laterality  Date   AMPUTATION Right 04/24/2020   Procedure: RIGHT 2ND AND 3RD TOE AMPUTATION;  Surgeon: Nadara Mustard, MD;  Location: [facility] OR;  Service: Orthopedics;  Laterality: Right;   AMPUTATION Right 10/22/2021   Procedure: RIGHT TRANSMETATARSAL AMPUTATION;  Surgeon: Nadara Mustard, MD;  Location: [facility] OR;  Service: Orthopedics;  Laterality: Right;   APPLICATION OF WOUND VAC Right 10/22/2021   Procedure: APPLICATION OF WOUND VAC;  Surgeon: Nadara Mustard, MD;  Location: [facility] OR;  Service: Orthopedics;  Laterality: Right;   CENTRAL VENOUS CATHETER INSERTION Left 04/03/2020   Procedure: INSERTION OF FEMORAL ARTERIAL LINE;  Surgeon: Linden Dolin, MD;  Location: [facility] OR;  Service: Open Heart Surgery;  Laterality: Left;   CORONARY ARTERY BYPASS GRAFT N/A 04/03/2020   Procedure: CORONARY ARTERY BYPASS GRAFTING (CABG) TIMES FOUR USING LEFT INTERNAL MAMMARY ARTERY AND LEFT RADIAL ARTERY.;  Surgeon: Linden Dolin, MD;  Location: [facility] OR;  Service: Open Heart Surgery;  Laterality: N/A;   CORONARY PRESSURE WIRE/FFR WITH 3D MAPPING N/A 03/29/2020   Procedure: Coronary Pressure Wire/FFR w/3D Mapping;  Surgeon: Lyn Records, MD;  Location: [facility] INVASIVE CV LAB;  Service: Cardiovascular;  Laterality: N/A;   EYE SURGERY Right 01/21/2021   right eye cataract lens replacement surgery   IR THORACENTESIS ASP PLEURAL SPACE W/IMG GUIDE  05/03/2020   IR THORACENTESIS ASP PLEURAL SPACE W/IMG GUIDE  05/16/2020   LEFT HEART CATH AND CORONARY ANGIOGRAPHY  N/A 03/29/2020   Procedure: LEFT HEART CATH AND CORONARY ANGIOGRAPHY;  Surgeon: Lyn Records, MD;  Location: [facility] INVASIVE CV LAB;  Service: Cardiovascular;  Laterality: N/A;   PENILE PROSTHESIS IMPLANT N/A 03/03/2021   Procedure: PENILE PROTHESIS INFLATABLE COLOPLAST;  Surgeon: Crista Elliot, MD;  [facility];  Service: Urology;  Laterality: N/A;  REQUESTING 2 HRS   RADIAL ARTERY HARVEST Left 04/03/2020   Procedure: LEFT RADIAL ARTERY HARVEST;   Surgeon: Linden Dolin, MD;  Location: [facility] OR;  Service: Open Heart Surgery;  Laterality: Left;   TEE WITHOUT CARDIOVERSION N/A 04/03/2020   Procedure: TRANSESOPHAGEAL ECHOCARDIOGRAM (TEE);  Surgeon: Linden Dolin, MD;  Location: [facility] OR;  Service: Open Heart Surgery;  Laterality: N/A;   UPPER EXTREMITY ANGIOGRAPHY  03/29/2020   Procedure: Upper Extremity Angiography;  Surgeon: Lyn Records, MD;  Location: [facility] INVASIVE CV LAB;  Service: Cardiovascular;;  rt.  arm     A IV Location/Drains/Wounds Patient Lines/Drains/Airways Status     Active Line/Drains/Airways     Name Placement date Placement time Site Days   Peripheral IV 03/05/22 18 G Left Antecubital 03/05/22  1555  Antecubital  1   Negative Pressure Wound Therapy Foot Anterior;Right 10/22/21  1109  --  135            Intake/Output Last 24 hours No intake or output data in the 24 hours ending 03/06/22 1523  Labs/Imaging Results for orders placed or performed during the hospital encounter of 03/05/22 (from the past 48 hour(s))  Urine rapid drug screen (hosp performed)     Status: Abnormal   Collection Time: 03/05/22  5:05 PM  Result Value Ref Range   Opiates NONE DETECTED NONE DETECTED   Cocaine NONE DETECTED NONE DETECTED   Benzodiazepines NONE DETECTED NONE DETECTED   Amphetamines NONE DETECTED NONE DETECTED   Tetrahydrocannabinol POSITIVE (A) NONE DETECTED   Barbiturates NONE DETECTED NONE DETECTED    Comment: (NOTE) DRUG SCREEN FOR MEDICAL PURPOSES ONLY.  IF CONFIRMATION IS NEEDED FOR ANY PURPOSE, NOTIFY LAB WITHIN 5 DAYS.  LOWEST DETECTABLE LIMITS FOR URINE DRUG SCREEN Drug Class                     Cutoff (ng/mL) Amphetamine and metabolites    1000 Barbiturate and metabolites    200 Benzodiazepine                 200 Opiates and metabolites        300 Cocaine and metabolites        300 THC                            50 Performed at [facility], [address] [place], [place] [address]    Resp panel by RT-PCR (RSV, Flu A&B, Covid) Anterior Nasal Swab     Status: Abnormal   Collection Time: 03/05/22  5:07 PM   Specimen: Anterior Nasal Swab  Result Value Ref Range   SARS Coronavirus 2 by RT PCR NEGATIVE NEGATIVE    Comment: (NOTE) SARS-CoV-2 target nucleic acids are NOT DETECTED.  The SARS-CoV-2 RNA is generally detectable in upper respiratory specimens during the acute phase of infection. The lowest concentration of SARS-CoV-2 viral copies this assay can detect is 138 copies/mL. A negative result does not preclude SARS-Cov-2 infection and should not be used as the sole basis for treatment or other patient  management decisions. A negative result may occur with  improper specimen collection/handling, submission of specimen other than nasopharyngeal swab, presence of viral mutation(s) within the areas targeted by this assay, and inadequate number of viral copies(<138 copies/mL). A negative result must be combined with clinical observations, patient history, and epidemiological information. The expected result is Negative.  Fact Sheet for Patients:  BloggerCourse.com  Fact Sheet for Healthcare Providers:  SeriousBroker.it  This test is no t yet approved or cleared by the [place] FDA and  has been authorized for detection and/or diagnosis of SARS-CoV-2 by FDA under an Emergency Use Authorization (EUA). This EUA will remain  in effect (meaning this test can be used) for the duration of the COVID-19 declaration under Section 564(b)(1) of the Act, 21 U.S.C.section 360bbb-3(b)(1), unless the authorization is terminated  or revoked sooner.       Influenza A by PCR POSITIVE (A) NEGATIVE   Influenza B by PCR NEGATIVE NEGATIVE    Comment: (NOTE) The Xpert Xpress SARS-CoV-2/FLU/RSV plus assay is intended as an aid in the diagnosis of influenza from Nasopharyngeal swab specimens and should not be used as a sole basis  for treatment. Nasal washings and aspirates are unacceptable for Xpert Xpress SARS-CoV-2/FLU/RSV testing.  Fact Sheet for Patients: BloggerCourse.com  Fact Sheet for Healthcare Providers: SeriousBroker.it  This test is not yet approved or cleared by the [place] FDA and has been authorized for detection and/or diagnosis of SARS-CoV-2 by FDA under an Emergency Use Authorization (EUA). This EUA will remain in effect (meaning this test can be used) for the duration of the COVID-19 declaration under Section 564(b)(1) of the Act, 21 U.S.C. section 360bbb-3(b)(1), unless the authorization is terminated or revoked.     Resp Syncytial Virus by PCR NEGATIVE NEGATIVE    Comment: (NOTE) Fact Sheet for Patients: BloggerCourse.com  Fact Sheet for Healthcare Providers: SeriousBroker.it  This test is not yet approved or cleared by the [place] FDA and has been authorized for detection and/or diagnosis of SARS-CoV-2 by FDA under an Emergency Use Authorization (EUA). This EUA will remain in effect (meaning this test can be used) for the duration of the COVID-19 declaration under Section 564(b)(1) of the Act, 21 U.S.C. section 360bbb-3(b)(1), unless the authorization is terminated or revoked.  Performed at [facility], [address] [place], [place] [address]   Troponin I (High Sensitivity)     Status: Abnormal   Collection Time: 03/05/22  6:30 PM  Result Value Ref Range   Troponin I (High Sensitivity) 25 (H) <18 ng/L    Comment: (NOTE) Elevated high sensitivity troponin I (hsTnI) values and significant  changes across serial measurements may suggest ACS but many other  chronic and acute conditions are known to elevate hsTnI results.  Refer to the "Links" section for chest pain algorithms and additional  guidance. Performed at [facility], [address]  [place], [place] [address]   Basic metabolic panel     Status: Abnormal   Collection Time: 03/05/22  8:21 PM  Result Value Ref Range   Sodium 134 (L) 135 - 145 mmol/L   Potassium 4.5 3.5 - 5.1 mmol/L    Comment: HEMOLYSIS AT THIS LEVEL MAY AFFECT RESULT   Chloride 104 98 - 111 mmol/L   CO2 18 (L) 22 - 32 mmol/L   Glucose, Bld 123 (H) 70 - 99 mg/dL    Comment: Glucose reference range applies only to samples taken after fasting for at least 8 hours.  BUN 32 (H) 8 - 23 mg/dL   Creatinine, Ser 1.611.50 (H) 0.61 - 1.24 mg/dL   Calcium 8.1 (L) 8.9 - 10.3 mg/dL   GFR, Estimated 51 (L) >60 mL/min    Comment: (NOTE) Calculated using the CKD-EPI Creatinine Equation (2021)    Anion gap 12 5 - 15    Comment: Performed at [facility], [address] [place], [place] [address]  CBC     Status: Abnormal   Collection Time: 03/05/22  8:21 PM  Result Value Ref Range   WBC 10.6 (H) 4.0 - 10.5 K/uL   RBC 3.80 (L) 4.22 - 5.81 MIL/uL   Hemoglobin 11.8 (L) 13.0 - 17.0 g/dL   HCT 54.034.2 (L) 98.139.0 - 19.152.0 %   MCV 90.0 80.0 - 100.0 fL   MCH 31.1 26.0 - 34.0 pg   MCHC 34.5 30.0 - 36.0 g/dL   RDW 47.813.3 29.511.5 - 62.115.5 %   Platelets 94 (L) 150 - 400 K/uL    Comment: Immature Platelet Fraction may be clinically indicated, consider ordering this additional test [id number] REPEATED TO VERIFY SPECIMEN CHECKED FOR CLOTS Few Platelet clumps noted on the slide that might falsely decrease the count. Platelets appear adequate on the slie.    nRBC 0.0 0.0 - 0.2 %    Comment: Performed at [facility], [address] [place], [place] [address]  Troponin I (High Sensitivity)     Status: Abnormal   Collection Time: 03/05/22  8:21 PM  Result Value Ref Range   Troponin I (High Sensitivity) 137 (HH) <18 ng/L    Comment: CRITICAL RESULT CALLED TO, READ BACK BY AND VERIFIED WITH L.BENNETT,RN. 2131 03/05/22. LPAIT (NOTE) Elevated high sensitivity troponin I (hsTnI) values and significant  changes across serial  measurements may suggest ACS but many other  chronic and acute conditions are known to elevate hsTnI results.  Refer to the "Links" section for chest pain algorithms and additional  guidance. Performed at [facility], [address] [place], [place] [address]   CBC     Status: Abnormal   Collection Time: 03/06/22  4:34 AM  Result Value Ref Range   WBC 11.3 (H) 4.0 - 10.5 K/uL   RBC 4.32 4.22 - 5.81 MIL/uL   Hemoglobin 13.4 13.0 - 17.0 g/dL   HCT 32.438.9 (L) 40.139.0 - 02.752.0 %   MCV 90.0 80.0 - 100.0 fL   MCH 31.0 26.0 - 34.0 pg   MCHC 34.4 30.0 - 36.0 g/dL   RDW 25.313.2 66.411.5 - 40.315.5 %   Platelets 118 (L) 150 - 400 K/uL   nRBC 0.0 0.0 - 0.2 %    Comment: Performed at [facility], [address] [place], [place] [address]  Heparin level (unfractionated)     Status: Abnormal   Collection Time: 03/06/22  4:34 AM  Result Value Ref Range   Heparin Unfractionated 1.01 (H) 0.30 - 0.70 IU/mL    Comment: (NOTE) The clinical reportable range upper limit is being lowered to >1.10 to align with the FDA approved guidance for the current laboratory assay.  If heparin results are below expected values, and patient dosage has  been confirmed, suggest follow up testing of antithrombin III levels. Performed at [facility], [address] [place], [place] [address]   Heparin level (unfractionated)     Status: None   Collection Time: 03/06/22  1:22 PM  Result Value Ref Range   Heparin Unfractionated 0.63 0.30 -  0.70 IU/mL    Comment: (NOTE) The clinical reportable range upper limit is being lowered to >1.10 to align with the FDA approved guidance for the current laboratory assay.  If heparin results are below expected values, and patient dosage has  been confirmed, suggest follow up testing of antithrombin III levels. Performed at [facility], [address] [place], [place] [address]    ECHOCARDIOGRAM COMPLETE  Result Date: 03/06/2022    ECHOCARDIOGRAM REPORT    Patient Name:   Curtis Watkins Date of Exam: 03/06/2022 Medical Rec #:  [id number]   Height:       70.0 in Accession #:    [id number]  Weight:       164.4 lb Date of Birth:  26-Sep-1955  BSA:          1.920 m Patient Age:    66 years    BP:           105/68 mmHg Patient Gender: M           HR:           66 bpm. Exam Location:  Inpatient Procedure: 2D Echo and Strain Analysis Indications:    elevated troponin  History:        Patient has prior history of Echocardiogram examinations, most                 recent 03/28/2020. CAD; Risk Factors:Hypertension and Diabetes.  Sonographer:    Cathie Hoops Referring Phys: [id number] CAROLE N HALL  Sonographer Comments: Global longitudinal strain was attempted. IMPRESSIONS  1. Left ventricular ejection fraction, by estimation, is 45 to 50%. The left ventricle has mildly decreased function. The left ventricle demonstrates global hypokinesis. Left ventricular diastolic parameters were normal. The average left ventricular global longitudinal strain is -17.8 %. The global longitudinal strain is abnormal.  2. Right ventricular systolic function is normal. The right ventricular size is normal. There is normal pulmonary artery systolic pressure. The estimated right ventricular systolic pressure is 6.0 mmHg.  3. The mitral valve is grossly normal. Mild mitral valve regurgitation. No evidence of mitral stenosis.  4. The aortic valve is tricuspid. Aortic valve regurgitation is not visualized. No aortic stenosis is present.  5. The inferior vena cava is normal in size with greater than 50% respiratory variability, suggesting right atrial pressure of 3 mmHg. Comparison(s): No significant change from prior study. FINDINGS  Left Ventricle: Left ventricular ejection fraction, by estimation, is 45 to 50%. The left ventricle has mildly decreased function. The left ventricle demonstrates global hypokinesis. The average left ventricular global longitudinal strain is -17.8 %. The global longitudinal strain  is abnormal. The left ventricular internal cavity size was normal in size. There is no left ventricular hypertrophy. Abnormal (paradoxical) septal motion consistent with post-operative status. Left ventricular diastolic parameters were normal. Right Ventricle: The right ventricular size is normal. No increase in right ventricular wall thickness. Right ventricular systolic function is normal. There is normal pulmonary artery systolic pressure. The tricuspid regurgitant velocity is 0.87 m/s, and  with an assumed right atrial pressure of 3 mmHg, the estimated right ventricular systolic pressure is 6.0 mmHg. Left Atrium: Left atrial size was normal in size. Right Atrium: Right atrial size was normal in size. Pericardium: There is no evidence of pericardial effusion. Mitral Valve: The mitral valve is grossly normal. Mild mitral valve regurgitation. No evidence of mitral valve stenosis. Tricuspid Valve: The tricuspid valve is grossly normal. Tricuspid valve regurgitation is trivial. No  evidence of tricuspid stenosis. Aortic Valve: The aortic valve is tricuspid. Aortic valve regurgitation is not visualized. No aortic stenosis is present. Aortic valve mean gradient measures 2.0 mmHg. Aortic valve peak gradient measures 4.5 mmHg. Aortic valve area, by VTI measures 2.61 cm. Pulmonic Valve: The pulmonic valve was not well visualized. Pulmonic valve regurgitation is not visualized. No evidence of pulmonic stenosis. Aorta: The aortic root is normal in size and structure. Venous: The inferior vena cava is normal in size with greater than 50% respiratory variability, suggesting right atrial pressure of 3 mmHg. IAS/Shunts: The atrial septum is grossly normal.  LEFT VENTRICLE PLAX 2D LVIDd:         4.80 cm      Diastology LVIDs:         3.50 cm      LV e' medial:    7.72 cm/s LV PW:         1.10 cm      LV E/e' medial:  9.5 LV IVS:        1.10 cm      LV e' lateral:   9.14 cm/s LVOT diam:     2.10 cm      LV E/e' lateral: 8.0 LV  SV:         59 LV SV Index:   31           2D Longitudinal Strain LVOT Area:     3.46 cm     2D Strain GLS Avg:     -17.8 %  LV Volumes (MOD) LV vol d, MOD A2C: 162.0 ml LV vol d, MOD A4C: 120.0 ml LV vol s, MOD A2C: 107.0 ml LV vol s, MOD A4C: 62.2 ml LV SV MOD A2C:     55.0 ml LV SV MOD A4C:     120.0 ml LV SV MOD BP:      57.0 ml RIGHT VENTRICLE RV Basal diam:  2.90 cm RV Mid diam:    2.60 cm RV S prime:     9.57 cm/s TAPSE (M-mode): 1.6 cm LEFT ATRIUM             Index        RIGHT ATRIUM          Index LA diam:        3.10 cm 1.61 cm/m   RA Area:     8.87 cm LA Vol (A2C):   51.2 ml 26.66 ml/m  RA Volume:   17.70 ml 9.22 ml/m LA Vol (A4C):   31.0 ml 16.14 ml/m LA Biplane Vol: 42.8 ml 22.29 ml/m  AORTIC VALVE AV Area (Vmax):    2.66 cm AV Area (Vmean):   2.50 cm AV Area (VTI):     2.61 cm AV Vmax:           106.00 cm/s AV Vmean:          70.800 cm/s AV VTI:            0.226 m AV Peak Grad:      4.5 mmHg AV Mean Grad:      2.0 mmHg LVOT Vmax:         81.30 cm/s LVOT Vmean:        51.100 cm/s LVOT VTI:          0.170 m LVOT/AV VTI ratio: 0.75  AORTA Ao Root diam: 3.60 cm MITRAL VALVE               TRICUSPID  VALVE MV Area (PHT): 3.91 cm    TR Peak grad:   3.0 mmHg MV Decel Time: 194 msec    TR Vmax:        87.10 cm/s MR Peak grad: 123.8 mmHg MR Vmax:      556.40 cm/s  SHUNTS MV E velocity: 73.40 cm/s  Systemic VTI:  0.17 m MV A velocity: 74.40 cm/s  Systemic Diam: 2.10 cm MV E/A ratio:  0.99 Eleonore Chiquito MD Electronically signed by Eleonore Chiquito MD Signature Date/Time: 03/06/2022/1:16:21 PM    Final    DG Chest 2 View  Result Date: 03/05/2022 CLINICAL DATA:  Chest pain.  Hypoxia and hypotension EXAM: CHEST - 2 VIEW COMPARISON:  06/20/2020 x-ray FINDINGS: Sternal wires. The right inferior costophrenic angles cut off the edge of the film. Mild left basilar scar or atelectasis. No pneumothorax or edema. Normal cardiopericardial silhouette. Degenerative changes along the spine IMPRESSION: Postop chest.   Mild basilar scar or atelectasis. The right inferior costophrenic angles clipped at the edge of the film. Electronically Signed   By: Jill Side M.D.   On: 03/05/2022 17:38    Pending Labs Unresulted Labs (From admission, onward)     Start     Ordered   03/07/22 0500  Heparin level (unfractionated)  Daily,   R      03/05/22 2318   03/07/22 9563  Basic metabolic panel  Tomorrow morning,   R        03/06/22 1154   03/07/22 0500  Magnesium  Tomorrow morning,   R        03/06/22 1154   03/07/22 0500  Phosphorus  Tomorrow morning,   R        03/06/22 1154   03/06/22 1900  Heparin level (unfractionated)  Once-Timed,   TIMED        03/06/22 1409   03/06/22 0500  CBC  Daily,   R      03/05/22 2318            Vitals/Pain Today's Vitals   03/06/22 1200 03/06/22 1230 03/06/22 1300 03/06/22 1341  BP: 108/81 101/68 105/69   Pulse: 78 78 80   Resp: (!) 23 18 18    Temp:    98 F (36.7 C)  TempSrc:      SpO2: 98% 99% 97%   PainSc:        Isolation Precautions No active isolations  Medications Medications  oseltamivir (TAMIFLU) capsule 30 mg (30 mg Oral Given 03/06/22 1139)  atorvastatin (LIPITOR) tablet 80 mg (80 mg Oral Given 03/06/22 1053)  aspirin EC tablet 162.5 mg (162.5 mg Oral Given 03/06/22 1140)  heparin ADULT infusion 100 units/mL (25000 units/263mL) (800 Units/hr Intravenous New Bag/Given 03/06/22 0628)  lactated ringers infusion (has no administration in time range)  ondansetron (ZOFRAN) injection 4 mg (4 mg Intravenous Given 03/05/22 1851)  lactated ringers bolus 1,000 mL (0 mLs Intravenous Stopped 03/05/22 1929)  acetaminophen (TYLENOL) tablet 1,000 mg (1,000 mg Oral Given 03/05/22 1851)  aspirin chewable tablet 324 mg (324 mg Oral Given 03/05/22 2157)  heparin bolus via infusion 4,000 Units (4,000 Units Intravenous Bolus from Bag 03/05/22 2307)    Mobility walks Low fall risk   Focused Assessments Cardiac Assessment Handoff:  Cardiac Rhythm: Normal sinus  rhythm No results found for: "CKTOTAL", "CKMB", "CKMBINDEX", "TROPONINI" No results found for: "DDIMER" Does the Patient currently have chest pain? No    R Recommendations: See Admitting Provider Note  Report given to:  Additional Notes:

## 2022-03-06 NOTE — Progress Notes (Signed)
Rounding Note    Patient Name: Curtis Watkins Date of Encounter: 03/06/2022  [place] Cardiologist: Freada Bergeron, MD   Subjective   This is a follow-up note in a patient who is seen in consultation earlier this morning by the cardiology fellow.  The patient is admitted with the flu.  He has no cardiac symptoms.  He denies any chest pain, chest pressure, or shortness of breath on my evaluation today.  States he came to the ER because he was feeling weak.  Inpatient Medications    Scheduled Meds:  aspirin EC  162.5 mg Oral Daily   atorvastatin  80 mg Oral Daily   oseltamivir  30 mg Oral BID   Continuous Infusions:  heparin 800 Units/hr (03/06/22 0628)   lactated ringers     PRN Meds:    Vital Signs    Vitals:   03/06/22 0800 03/06/22 0954 03/06/22 1000 03/06/22 1030  BP: 99/71  119/83 110/76  Pulse: 66  64 73  Resp: (!) 22  14 (!) 25  Temp:  98.6 F (37 C)    TempSrc:      SpO2: 98%  100% 99%   No intake or output data in the 24 hours ending 03/06/22 1255    01/06/2022    4:05 PM 11/28/2021    2:22 PM 10/22/2021    8:11 AM  Last 3 Weights  Weight (lbs) 164 lb 6.4 oz 155 lb 155 lb  Weight (kg) 74.571 kg 70.308 kg 70.308 kg       ECG    Normal sinus rhythm with LVH no acute ST or T wave changes- Personally Reviewed  Physical Exam  Alert, oriented, no distress GEN: No acute distress.   Neck: No JVD Cardiac: RRR, no murmurs, rubs, or gallops.  Respiratory: Clear to auscultation bilaterally. GI: Soft, nontender, non-distended  MS: No edema; No deformity. Neuro:  Nonfocal  Psych: Normal affect   Labs    High Sensitivity Troponin:   Recent Labs  Lab 03/05/22 1830 03/05/22 2021  TROPONINIHS 25* 137*     Chemistry Recent Labs  Lab 03/05/22 2021  NA 134*  K 4.5  CL 104  CO2 18*  GLUCOSE 123*  BUN 32*  CREATININE 1.50*  CALCIUM 8.1*  GFRNONAA 51*  ANIONGAP 12    Lipids No results for input(s): "CHOL", "TRIG", "HDL",  "LABVLDL", "LDLCALC", "CHOLHDL" in the last 168 hours.  Hematology Recent Labs  Lab 03/05/22 2021 03/06/22 0434  WBC 10.6* 11.3*  RBC 3.80* 4.32  HGB 11.8* 13.4  HCT 34.2* 38.9*  MCV 90.0 90.0  MCH 31.1 31.0  MCHC 34.5 34.4  RDW 13.3 13.2  PLT 94* 118*   Thyroid No results for input(s): "TSH", "FREET4" in the last 168 hours.  BNPNo results for input(s): "BNP", "PROBNP" in the last 168 hours.  DDimer No results for input(s): "DDIMER" in the last 168 hours.   Radiology    DG Chest 2 View  Result Date: 03/05/2022 CLINICAL DATA:  Chest pain.  Hypoxia and hypotension EXAM: CHEST - 2 VIEW COMPARISON:  06/20/2020 x-ray FINDINGS: Sternal wires. The right inferior costophrenic angles cut off the edge of the film. Mild left basilar scar or atelectasis. No pneumothorax or edema. Normal cardiopericardial silhouette. Degenerative changes along the spine IMPRESSION: Postop chest.  Mild basilar scar or atelectasis. The right inferior costophrenic angles clipped at the edge of the film. Electronically Signed   By: Jill Side M.D.   On: 03/05/2022  17:38     Patient Profile     67 y.o. male with mild troponin elevation in the setting of influenza A  Assessment & Plan    Note is outlined per cardiology fellow early this morning, Dr. Grayce Sessions.  In reviewing the patient's labs, clinical presentation, and history, I do not think this is ACS.  The patient has typical symptoms of a viral syndrome and is found to have influenza A.  He has no chest pain or pressure.  His troponin is mildly elevated with serial high-sensitivity troponins of 25 and 137.  The patient has had surgical revascularization with arterial conduits.  He has had no recent anginal symptoms.  Cardiac catheterization is not indicated.  Will repeat a troponin level to make sure that there is not a spike and then I would recommend discontinuing IV heparin if the value is relatively flat.  2D echo is pending.  Agree with holding his  antihypertensive medication since his blood pressure is soft.  Otherwise supportive care per the primary service.  No other recommendations at this time.        For questions or updates, please contact [place] Please consult [email or web address] for contact info under        Signed, Sherren Mocha, MD  03/06/2022, 12:55 PM

## 2022-03-06 NOTE — Discharge Instructions (Signed)
Follow with Primary MD Stephens, Amy J, NP in 7 days  ° °Get CBC, CMP, checked  by Primary MD next visit.  ° ° °Activity: As tolerated with Full fall precautions use walker/cane & assistance as needed ° ° °Disposition Home  ° ° °Diet: Heart Healthy  . ° °On your next visit with your primary care physician please Get Medicines reviewed and adjusted. ° ° °Please request your Prim.MD to go over all Hospital Tests and Procedure/Radiological results at the follow up, please get all Hospital records sent to your Prim MD by signing hospital release before you go home. ° ° °If you experience worsening of your admission symptoms, develop shortness of breath, life threatening emergency, suicidal or homicidal thoughts you must seek medical attention immediately by calling 911 or calling your MD immediately  if symptoms less severe. ° °You Must read complete instructions/literature along with all the possible adverse reactions/side effects for all the Medicines you take and that have been prescribed to you. Take any new Medicines after you have completely understood and accpet all the possible adverse reactions/side effects.  ° °Do not drive, operating heavy machinery, perform activities at heights, swimming or participation in water activities or provide baby sitting services if your were admitted for syncope or siezures until you have seen by Primary MD or a Neurologist and advised to do so again. ° °Do not drive when taking Pain medications.  ° ° °Do not take more than prescribed Pain, Sleep and Anxiety Medications ° °Special Instructions: If you have smoked or chewed Tobacco  in the last 2 yrs please stop smoking, stop any regular Alcohol  and or any Recreational drug use. ° °Wear Seat belts while driving. ° ° °Please note ° °You were cared for by a hospitalist during your hospital stay. If you have any questions about your discharge medications or the care you received while you were in the hospital after you are  discharged, you can call the unit and asked to speak with the hospitalist on call if the hospitalist that took care of you is not available. Once you are discharged, your primary care physician will handle any further medical issues. Please note that NO REFILLS for any discharge medications will be authorized once you are discharged, as it is imperative that you return to your primary care physician (or establish a relationship with a primary care physician if you do not have one) for your aftercare needs so that they can reassess your need for medications and monitor your lab values.  °

## 2022-03-06 NOTE — Progress Notes (Signed)
Next trop 160, remains low and flat per Dr. Antionette Char review. He spoke with IM and patient is being discharged. Per d/w MD, I made f/u 03/23/22 and put on AVS.

## 2022-03-06 NOTE — ED Notes (Signed)
Medication requested from pharmacy.

## 2022-03-06 NOTE — Progress Notes (Signed)
  Echocardiogram 2D Echocardiogram has been performed.  Curtis Watkins 03/06/2022, 10:05 AM

## 2022-03-06 NOTE — Consult Note (Signed)
Cardiology Consultation:   Patient ID: Curtis Watkins MRN: [id number]; DOB: [birth year]  Admit date: 03/05/2022 Date of Consult: 03/06/2022  Primary Care Provider: Rema Fendt, NP [facility] Cardiologist: Meriam Sprague, MD  [facility] Electrophysiologist:  None    Patient Profile:   Curtis Watkins is a 67 y.o. male with a hx of HTN, T2DM, ischemic cardiomyopathy (s/p CABG in Feb 2022 with LIMA-->LAD/diag, L radial-->OM1/OM3) who is being seen today for the evaluation of NSTEMI at the request of ED.  History of Present Illness:   [name] reports that he was USOH until approximately two weeks prior to presentation. He states that his entirely family has contracted a viral illness and have had similar symptoms including fatigue, cough, general malaise. He presented to Urgent Care for further evaluation of these symptoms and was found to be hypoxemic (O2 saturations in 80s) and hypotensive with systolic pressures in 90s. He was referred to ED for further evaluation.   On arrival to ED, [name] was afebrile, hemodynamically stable with HR 60s and BP 100/70s. Initial labs notable for hsTnT 25 --> 127. Cardiology consulted for further evaluation.   On my history, patient reports intermittent substernal chest discomfort, mild in severity, new over the past several days. The pain is described as a "subtle discomfort", non-radiating. No clear association with exertion. No associated symptoms. Denies any persistent chest discomfort at time of my assessment. Of note, patient states that he was entirely asymptomatic prior to CABG in 2022.    Past Medical History:  Diagnosis Date   Blind right eye    Carotid artery disease (HCC)    04/01/20: 60-79% RICA stenosis, 1-39% LICA stenosis   Chronic kidney disease    ckd stage 3 a per dr Ronald Lobo 02-05-2021 epic   Coronary artery disease    NSTEMI s/p CABG in 03/2020 (LIMA-->LAD/diag, L radial-->OM1/OM3); b.   DM type 2    Glaucoma of  right eye, unspecified glaucoma type    HFrEF (heart failure with reduced ejection fraction) (HCC)    Echo 6/22: EF 45, global HK, mild MR, RVSP 31.9   Hypertension    Ischemic cardiomyopathy    PAD (peripheral artery disease) (HCC)    occluded right axillary artery 03/29/20     Past Surgical History:  Procedure Laterality Date   AMPUTATION Right 04/24/2020   Procedure: RIGHT 2ND AND 3RD TOE AMPUTATION;  Surgeon: Nadara Mustard, MD;  Location: [facility] OR;  Service: Orthopedics;  Laterality: Right;   AMPUTATION Right 10/22/2021   Procedure: RIGHT TRANSMETATARSAL AMPUTATION;  Surgeon: Nadara Mustard, MD;  Location: [facility] OR;  Service: Orthopedics;  Laterality: Right;   APPLICATION OF WOUND VAC Right 10/22/2021   Procedure: APPLICATION OF WOUND VAC;  Surgeon: Nadara Mustard, MD;  Location: [facility] OR;  Service: Orthopedics;  Laterality: Right;   CENTRAL VENOUS CATHETER INSERTION Left 04/03/2020   Procedure: INSERTION OF FEMORAL ARTERIAL LINE;  Surgeon: Linden Dolin, MD;  Location: [facility] OR;  Service: Open Heart Surgery;  Laterality: Left;   CORONARY ARTERY BYPASS GRAFT N/A 04/03/2020   Procedure: CORONARY ARTERY BYPASS GRAFTING (CABG) TIMES FOUR USING LEFT INTERNAL MAMMARY ARTERY AND LEFT RADIAL ARTERY.;  Surgeon: Linden Dolin, MD;  Location: [facility] OR;  Service: Open Heart Surgery;  Laterality: N/A;   CORONARY PRESSURE WIRE/FFR WITH 3D MAPPING N/A 03/29/2020   Procedure: Coronary Pressure Wire/FFR w/3D Mapping;  Surgeon: Lyn Records, MD;  Location: [facility] INVASIVE CV LAB;  Service: Cardiovascular;  Laterality: N/A;   EYE SURGERY Right 01/21/2021   right eye cataract lens replacement surgery   IR THORACENTESIS ASP PLEURAL SPACE W/IMG GUIDE  05/03/2020   IR THORACENTESIS ASP PLEURAL SPACE W/IMG GUIDE  05/16/2020   LEFT HEART CATH AND CORONARY ANGIOGRAPHY N/A 03/29/2020   Procedure: LEFT HEART CATH AND CORONARY ANGIOGRAPHY;  Surgeon: Lyn Records, MD;  Location: [facility] INVASIVE CV LAB;  Service:  Cardiovascular;  Laterality: N/A;   PENILE PROSTHESIS IMPLANT N/A 03/03/2021   Procedure: PENILE PROTHESIS INFLATABLE COLOPLAST;  Surgeon: Crista Elliot, MD;  Location: [facility];  Service: Urology;  Laterality: N/A;  REQUESTING 2 HRS   RADIAL ARTERY HARVEST Left 04/03/2020   Procedure: LEFT RADIAL ARTERY HARVEST;  Surgeon: Linden Dolin, MD;  Location: [facility] OR;  Service: Open Heart Surgery;  Laterality: Left;   TEE WITHOUT CARDIOVERSION N/A 04/03/2020   Procedure: TRANSESOPHAGEAL ECHOCARDIOGRAM (TEE);  Surgeon: Linden Dolin, MD;  Location: [facility] OR;  Service: Open Heart Surgery;  Laterality: N/A;   UPPER EXTREMITY ANGIOGRAPHY  03/29/2020   Procedure: Upper Extremity Angiography;  Surgeon: Lyn Records, MD;  Location: [facility] INVASIVE CV LAB;  Service: Cardiovascular;;  rt.  arm     Home Medications:  Prior to Admission medications   Medication Sig Start Date End Date Taking? Authorizing Provider  aspirin EC 325 MG tablet Take 162.5 mg by mouth daily.   Yes [provider]  atorvastatin (LIPITOR) 80 MG tablet Take 1 tablet (80 mg total) by mouth daily. 01/06/22  Yes Meriam Sprague, MD  brimonidine (ALPHAGAN) 0.2 % ophthalmic solution Place 1 drop into the left eye 2 (two) times daily. 03/20/21  Yes   dorzolamide-timolol (COSOPT) 22.3-6.8 MG/ML ophthalmic solution Place 1 drop into the left eye 2 (two) times daily. 03/20/21  Yes   empagliflozin (JARDIANCE) 25 MG TABS tablet Take 1 tablet (25 mg total) by mouth daily before breakfast. 01/06/22  Yes Pemberton, Kathlynn Grate, MD  furosemide (LASIX) 20 MG tablet Take 1 tablet (20 mg total) by mouth 3 (three) times a week. Take on Mondays, Wednesdays, and Fridays. 02/27/22  Yes Meriam Sprague, MD  losartan (COZAAR) 25 MG tablet Take 0.5 tablets (12.5 mg total) by mouth daily. 02/24/22  Yes Meriam Sprague, MD  metoprolol succinate (TOPROL XL) 25 MG 24 hr tablet Take 1.5 tablets (37.5 mg total) by mouth daily.  02/13/22  Yes Meriam Sprague, MD  spironolactone (ALDACTONE) 25 MG tablet Take 0.5 tablets (12.5 mg total) by mouth daily. 01/05/22  Yes Meriam Sprague, MD  blood glucose meter kit and supplies by Other route as directed. Dispense based on patient and insurance preference. Use up to four times daily as directed. (FOR ICD-10 E10.9, E11.9).    [provider]  HYDROcodone-acetaminophen (NORCO/VICODIN) 5-325 MG tablet Take 1 tablet by mouth every 4 (four) hours as needed. Patient not taking: Reported on 01/06/2022 10/22/21   Nadara Mustard, MD  Semaglutide,0.25 or 0.5MG /DOS, (OZEMPIC, 0.25 OR 0.5 MG/DOSE,) 2 MG/3ML SOPN Inject 0.5 mg into the skin once a week. Patient not taking: Reported on 01/06/2022 10/09/21   Shamleffer, Konrad Dolores, MD    Inpatient Medications: Scheduled Meds:  aspirin EC  162.5 mg Oral Daily   atorvastatin  80 mg Oral Daily   furosemide  20 mg Oral Once per day on Mon Wed Fri   oseltamivir  30 mg Oral BID   Continuous Infusions:  heparin 900 Units/hr (03/05/22 2307)  PRN Meds:   Allergies:   No Known Allergies  Social History:   Social History   Socioeconomic History   Marital status: Significant Other    Spouse name: Not on file   Number of children: 3   Years of education: Not on file   Highest education level: High school graduate  Occupational History   Occupation: retired d/t medical conditions  Tobacco Use   Smoking status: Every Day    Packs/day: 1.00    Years: 0.50    Total pack years: 0.50    Types: Cigarettes    Passive exposure: Current   Smokeless tobacco: Never  Vaping Use   Vaping Use: Never used  Substance and Sexual Activity   Alcohol use: Not Currently    Alcohol/week: 1.0 standard drink of alcohol    Types: 1 Cans of beer per week    Comment: occassionally beer   Drug use: Not Currently    Types: Marijuana    Comment: marijuana last use 3 xweek last used 02-23-2021   Sexual activity: Not Currently  Other  Topics Concern   Not on file  Social History Narrative   Not on file   Social Determinants of Health   Financial Resource Strain: High Risk (05/28/2020)   Overall Financial Resource Strain (CARDIA)    Difficulty of Paying Living Expenses: Hard  Food Insecurity: No Food Insecurity (05/28/2020)   Hunger Vital Sign    Worried About Running Out of Food in the Last Year: Never true    Ran Out of Food in the Last Year: Never true  Transportation Needs: No Transportation Needs (11/27/2021)   PRAPARE - Hydrologist (Medical): No    Lack of Transportation (Non-Medical): No  Physical Activity: Inactive (05/28/2020)   Exercise Vital Sign    Days of Exercise per Week: 0 days    Minutes of Exercise per Session: 0 min  Stress: Not on file  Social Connections: Not on file  Intimate Partner Violence: Not on file    Family History:   Family History  Problem Relation Age of Onset   Diabetes Mother      Physical Exam/Data:   Vitals:   03/06/22 0200 03/06/22 0230 03/06/22 0245 03/06/22 0255  BP: 112/83 118/71 113/77   Pulse: 66 63 67   Resp: 17 18 18    Temp:    98.5 F (36.9 C)  TempSrc:    Oral  SpO2: 100% 100% 100%    No intake or output data in the 24 hours ending 03/06/22 0256    01/06/2022    4:05 PM 11/28/2021    2:22 PM 10/22/2021    8:11 AM  Last 3 Weights  Weight (lbs) 164 lb 6.4 oz 155 lb 155 lb  Weight (kg) 74.571 kg 70.308 kg 70.308 kg     There is no height or weight on file to calculate BMI.  General:  NAD HEENT: normal Lymph: no adenopathy Neck: no JVD Endocrine:  No thryomegaly Vascular: No carotid bruits; FA pulses 2+ bilaterally without bruits  Cardiac:  Clear S1, S2; RRR Lungs:  clear to auscultation bilaterally, no wheezing, rhonchi or rales  Abd: soft, nontender, no hepatomegaly  Ext: no edema Musculoskeletal:  No deformities, BUE and BLE strength normal and equal Skin: warm and dry  Neuro:  CNs 2-12 intact, no focal abnormalities  noted Psych:  Normal affect   EKG:  The EKG was personally reviewed and demonstrates:  NSR, no acute ischemic  changes.   Laboratory Data:  High Sensitivity Troponin:   Recent Labs  Lab 03/05/22 1830 03/05/22 2021  TROPONINIHS 25* 137*     Chemistry Recent Labs  Lab 03/05/22 2021  NA 134*  K 4.5  CL 104  CO2 18*  GLUCOSE 123*  BUN 32*  CREATININE 1.50*  CALCIUM 8.1*  GFRNONAA 51*  ANIONGAP 12    No results for input(s): "PROT", "ALBUMIN", "AST", "ALT", "ALKPHOS", "BILITOT" in the last 168 hours. Hematology Recent Labs  Lab 03/05/22 2021  WBC 10.6*  RBC 3.80*  HGB 11.8*  HCT 34.2*  MCV 90.0  MCH 31.1  MCHC 34.5  RDW 13.3  PLT 94*   BNPNo results for input(s): "BNP", "PROBNP" in the last 168 hours.  DDimer No results for input(s): "DDIMER" in the last 168 hours.  Radiology/Studies:  DG Chest 2 View  Result Date: 03/05/2022 CLINICAL DATA:  Chest pain.  Hypoxia and hypotension EXAM: CHEST - 2 VIEW COMPARISON:  06/20/2020 x-ray FINDINGS: Sternal wires. The right inferior costophrenic angles cut off the edge of the film. Mild left basilar scar or atelectasis. No pneumothorax or edema. Normal cardiopericardial silhouette. Degenerative changes along the spine IMPRESSION: Postop chest.  Mild basilar scar or atelectasis. The right inferior costophrenic angles clipped at the edge of the film. Electronically Signed   By: Jill Side M.D.   On: 03/05/2022 17:38      TIMI Risk Score for Unstable Angina or Non-ST Elevation MI:   The patient's TIMI risk score is 5, which indicates a 26% risk of all cause mortality, new or recurrent myocardial infarction or need for urgent revascularization in the next 14 days.   Assessment and Plan:   #NSTEMI Uptrending hsTnT in the setting of recent substernal chest discomfort, new from prior, with significant risk profile; TIMI Risk Score = 5, corresponding to 26% composite 14d risk all cause mortality, new or recurrent MI, or severe  recurrent ischemia requiring revascularization.  - S/p ASA load, continue ASA 81mg  QD - Heparin gtt at ACS dosing - SL NTG PRN if recurrent pain - Continue home atorva 80mg  QD - NPO pMN for coronary angiography   #Chronic Ischemic Cardiomyopathy #CAD s/p 4vCABG (LIMA-LAD/D1, L radial-OM1/OM3) Asymptomatic prior to CABG. Denies anginal symptoms with exertion. He does report new substernal chest "discomfort" that is new over past 1-2 weeks. Last echo acquired in February 2022 with LVEF 40-45%. Weight stable. - Repeat TTE - Continue home GDMT - Low Na diet   Cardiology consult service to see in AM with additional recommendations to follow at that time.      For questions or updates, please contact [place] Please consult [email or web address] for contact info under    Signed, Delorse Limber, MD  03/06/2022 2:56 AM

## 2022-03-06 NOTE — H&P (Addendum)
History and Physical  Curtis Watkins:[id number] DOB: March 05, 1955 DOA: 03/05/2022  Referring physician: Dr. Brunetta Jeans, resident, EDP PCP: Rema Fendt, NP  Outpatient Specialists: Cardiology. Patient coming from: Home.  Chief Complaint: Generalized weakness  HPI: Curtis Watkins is a 67 y.o. male with medical history significant for coronary artery disease status post CABG, combined diastolic and systolic CHF, type 2 diabetes, diabetic foot ulcer, hypertension, who initially presented to urgent care due to generalized weakness and feeling unwell for the past 2 weeks.  At the urgent care, initially hypotensive and hypoxic with SBP less than 90 and O2 saturation in the 80s.  He was sent to [facility] ED for further evaluation.  Influenza A+ by PCR.    In the ED, chest x-ray with mild basilar scar or atelectasis.  Initial troponin 25, repeat 137.  No evidence of acute ischemia on twelve-lead EKG.  ED discussed the case with cardiology who recommended heparin drip.  Will see in consultation.  ED Course: Tmax 101.  BP 103/73, pulse 60, respiration rate 18, saturation 98% on 2 L.  Lab studies remarkable for high-sensitivity troponin 25, 127.  Creatinine 1.50, GFR 51, baseline.  Sodium 134, serum bicarb 18.  WBC 10.6, hemoglobin 11.8.  Platelet 94.  Review of Systems: Review of systems as noted in the HPI. All other systems reviewed and are negative.   Past Medical History:  Diagnosis Date   Blind right eye    Carotid artery disease (HCC)    04/01/20: 60-79% RICA stenosis, 1-39% LICA stenosis   Chronic kidney disease    ckd stage 3 a per dr Ronald Lobo 02-05-2021 epic   Coronary artery disease    NSTEMI s/p CABG in 03/2020 (LIMA-->LAD/diag, L radial-->OM1/OM3); b.   DM type 2    Glaucoma of right eye, unspecified glaucoma type    HFrEF (heart failure with reduced ejection fraction) (HCC)    Echo 6/22: EF 45, global HK, mild MR, RVSP 31.9   Hypertension    Ischemic cardiomyopathy    PAD (peripheral  artery disease) (HCC)    occluded right axillary artery 03/29/20    Past Surgical History:  Procedure Laterality Date   AMPUTATION Right 04/24/2020   Procedure: RIGHT 2ND AND 3RD TOE AMPUTATION;  Surgeon: Nadara Mustard, MD;  Location: [facility] OR;  Service: Orthopedics;  Laterality: Right;   AMPUTATION Right 10/22/2021   Procedure: RIGHT TRANSMETATARSAL AMPUTATION;  Surgeon: Nadara Mustard, MD;  Location[facility] OR;  Service: Orthopedics;  Laterality: Right;   APPLICATION OF WOUND VAC Right 10/22/2021   Procedure: APPLICATION OF WOUND VAC;  Surgeon: Nadara Mustard, MD;  Location: [facility] OR;  Service: Orthopedics;  Laterality: Right;   CENTRAL VENOUS CATHETER INSERTION Left 04/03/2020   Procedure: INSERTION OF FEMORAL ARTERIAL LINE;  Surgeon: Linden Dolin, MD;  Location: [facility] OR;  Service: Open Heart Surgery;  Laterality: Left;   CORONARY ARTERY BYPASS GRAFT N/A 04/03/2020   Procedure: CORONARY ARTERY BYPASS GRAFTING (CABG) TIMES FOUR USING LEFT INTERNAL MAMMARY ARTERY AND LEFT RADIAL ARTERY.;  Surgeon: Linden Dolin, MD;  Location: [facility] OR;  Service: Open Heart Surgery;  Laterality: N/A;   CORONARY PRESSURE WIRE/FFR WITH 3D MAPPING N/A 03/29/2020   Procedure: Coronary Pressure Wire/FFR w/3D Mapping;  Surgeon: Lyn Records, MD;  Location: [facility] INVASIVE CV LAB;  Service: Cardiovascular;  Laterality: N/A;   EYE SURGERY Right 01/21/2021   right eye cataract lens replacement surgery   IR THORACENTESIS ASP PLEURAL SPACE W/IMG GUIDE  05/03/2020   IR THORACENTESIS ASP PLEURAL SPACE W/IMG GUIDE  05/16/2020   LEFT HEART CATH AND CORONARY ANGIOGRAPHY N/A 03/29/2020   Procedure: LEFT HEART CATH AND CORONARY ANGIOGRAPHY;  Surgeon: Belva Crome, MD;  Location: [place] CV LAB;  Service: Cardiovascular;  Laterality: N/A;   PENILE PROSTHESIS IMPLANT N/A 03/03/2021   Procedure: PENILE PROTHESIS INFLATABLE COLOPLAST;  Surgeon: Lucas Mallow, MD;  Location: [place];  Service: Urology;   Laterality: N/A;  REQUESTING 2 HRS   RADIAL ARTERY HARVEST Left 04/03/2020   Procedure: LEFT RADIAL ARTERY HARVEST;  Surgeon: Wonda Olds, MD;  Location: [place];  Service: Open Heart Surgery;  Laterality: Left;   TEE WITHOUT CARDIOVERSION N/A 04/03/2020   Procedure: TRANSESOPHAGEAL ECHOCARDIOGRAM (TEE);  Surgeon: Wonda Olds, MD;  Location: [place];  Service: Open Heart Surgery;  Laterality: N/A;   UPPER EXTREMITY ANGIOGRAPHY  03/29/2020   Procedure: Upper Extremity Angiography;  Surgeon: Belva Crome, MD;  Location: [place] CV LAB;  Service: Cardiovascular;;  rt.  arm    Social History:  reports that he has been smoking cigarettes. He has a 0.50 pack-year smoking history. He has been exposed to tobacco smoke. He has never used smokeless tobacco. He reports that he does not currently use alcohol after a past usage of about 1.0 standard drink of alcohol per week. He reports that he does not currently use drugs after having used the following drugs: Marijuana.   No Known Allergies  Family History  Problem Relation Age of Onset   Diabetes Mother       Prior to Admission medications   Medication Sig Start Date End Date Taking? Authorizing Provider  aspirin EC 325 MG tablet Take 162.5 mg by mouth daily.   Yes [provider]  atorvastatin (LIPITOR) 80 MG tablet Take 1 tablet (80 mg total) by mouth daily. 01/06/22  Yes Freada Bergeron, MD  brimonidine (ALPHAGAN) 0.2 % ophthalmic solution Place 1 drop into the left eye 2 (two) times daily. 03/20/21  Yes   dorzolamide-timolol (COSOPT) 22.3-6.8 MG/ML ophthalmic solution Place 1 drop into the left eye 2 (two) times daily. 03/20/21  Yes   empagliflozin (JARDIANCE) 25 MG TABS tablet Take 1 tablet (25 mg total) by mouth daily before breakfast. 01/06/22  Yes Pemberton, Greer Ee, MD  furosemide (LASIX) 20 MG tablet Take 1 tablet (20 mg total) by mouth 3 (three) times a week. Take on Mondays, Wednesdays, and Fridays. 02/27/22   Yes Freada Bergeron, MD  losartan (COZAAR) 25 MG tablet Take 0.5 tablets (12.5 mg total) by mouth daily. 02/24/22  Yes Freada Bergeron, MD  metoprolol succinate (TOPROL XL) 25 MG 24 hr tablet Take 1.5 tablets (37.5 mg total) by mouth daily. 02/13/22  Yes Freada Bergeron, MD  spironolactone (ALDACTONE) 25 MG tablet Take 0.5 tablets (12.5 mg total) by mouth daily. 01/05/22  Yes Freada Bergeron, MD  blood glucose meter kit and supplies by Other route as directed. Dispense based on patient and insurance preference. Use up to four times daily as directed. (FOR ICD-10 E10.9, E11.9).    [provider]  HYDROcodone-acetaminophen (NORCO/VICODIN) 5-325 MG tablet Take 1 tablet by mouth every 4 (four) hours as needed. Patient not taking: Reported on 01/06/2022 10/22/21   Newt Minion, MD  Semaglutide,0.25 or 0.5MG /DOS, (OZEMPIC, 0.25 OR 0.5 MG/DOSE,) 2 MG/3ML SOPN Inject 0.5 mg into the skin once a week. Patient not taking: Reported on 01/06/2022 10/09/21  Shamleffer, Konrad Dolores, MD    Physical Exam: BP 109/72   Pulse 69   Temp 99.2 F (37.3 C)   Resp 19   SpO2 99%   General: 67 y.o. year-old male well developed well nourished in no acute distress.  Alert and oriented x3. Cardiovascular: Regular rate and rhythm with no rubs or gallops.  No thyromegaly or JVD noted.  No lower extremity edema. 2/4 pulses in all 4 extremities. Respiratory: Clear to auscultation with no wheezes or rales. Good inspiratory effort. Abdomen: Soft nontender nondistended with normal bowel sounds x4 quadrants. Muskuloskeletal: No cyanosis, clubbing or edema noted bilaterally Neuro: CN II-XII intact, strength, sensation, reflexes Skin: No ulcerative lesions noted or rashes Psychiatry: Judgement and insight appear normal. Mood is appropriate for condition and setting          Labs on Admission:  Basic Metabolic Panel: Recent Labs  Lab 03/05/22 2021  NA 134*  K 4.5  CL 104  CO2 18*   GLUCOSE 123*  BUN 32*  CREATININE 1.50*  CALCIUM 8.1*   Liver Function Tests: No results for input(s): "AST", "ALT", "ALKPHOS", "BILITOT", "PROT", "ALBUMIN" in the last 168 hours. No results for input(s): "LIPASE", "AMYLASE" in the last 168 hours. No results for input(s): "AMMONIA" in the last 168 hours. CBC: Recent Labs  Lab 03/05/22 2021  WBC 10.6*  HGB 11.8*  HCT 34.2*  MCV 90.0  PLT 94*   Cardiac Enzymes: No results for input(s): "CKTOTAL", "CKMB", "CKMBINDEX", "TROPONINI" in the last 168 hours.  BNP (last 3 results) No results for input(s): "BNP" in the last 8760 hours.  ProBNP (last 3 results) No results for input(s): "PROBNP" in the last 8760 hours.  CBG: Recent Labs  Lab 03/05/22 1558  GLUCAP 129*    Radiological Exams on Admission: DG Chest 2 View  Result Date: 03/05/2022 CLINICAL DATA:  Chest pain.  Hypoxia and hypotension EXAM: CHEST - 2 VIEW COMPARISON:  06/20/2020 x-ray FINDINGS: Sternal wires. The right inferior costophrenic angles cut off the edge of the film. Mild left basilar scar or atelectasis. No pneumothorax or edema. Normal cardiopericardial silhouette. Degenerative changes along the spine IMPRESSION: Postop chest.  Mild basilar scar or atelectasis. The right inferior costophrenic angles clipped at the edge of the film. Electronically Signed   By: Karen Kays M.D.   On: 03/05/2022 17:38    EKG: I independently viewed the EKG done and my findings are as followed: Sinus rhythm rate of 78.  Nonspecific ST-T changes.  QTc 433.  Assessment/Plan Present on Admission: **None**  Principal Problem:   Generalized weakness  Generalized weakness likely secondary to influenza A viral infection Unclear when flulike symptoms began, influenza a PCR positive on 03/05/2022. Start Tamiflu Treat underlying conditions. Fall precautions.  Elevated troponin with concern for NSTEMI Initial high-sensitivity troponin 25 with high delta, repeat 127. No evidence  of acute ischemia on 12-lead EKG Trend troponin, serial twelve-lead EKG Received a full dose aspirin in the ED. Started on heparin drip with the guidance of cardiology. Resume home Lipitor and home daily aspirin Cardiology consulted by EDP. Follow 2D echo  Mild non anion gap metabolic acidosis Serum bicarb 18 Anion gap 12 Monitor for now Repeat BMP in the morning  HFrEF 45% Last 2D echo done on 08/20/2020 revealed LVEF 45%. Follow repeat 2D echo Resume home p.o. Lasix 20 mg 3 times a week. Start strict I's and O's and daily weight  Coronary artery disease status post CABG Resume home aspirin, Lipitor. Denies  any chest pain at the time of this visit.  Hyperlipidemia Resume home Lipitor  Hypertension, BPs are soft Hold off home oral antihypertensives Closely monitor vital signs  CKD 3B Appears to be at her baseline creatinine 1.5 with GFR 57. Avoid nephrotoxic agents, and hypotension. Monitor urine output with strict I's and O's.  Hyperglycemia Obtain hemoglobin A1c Start insulin sliding scale.    Critical care time: 65 minutes.    DVT prophylaxis: Subcu Lovenox daily  Code Status: Full code  Family Communication: None at bedside  Disposition Plan: Admitted to telemetry cardiac unit  Consults called: Cardiology consulted by EDP  Admission status: Inpatient status.   Status is: Inpatient The patient requires at least 2 midnights for further evaluation and treatment of present condition.   Kayleen Memos MD [facility] Pager [phone]  If 7PM-7AM, please contact night-coverage [email or web address] Password [facility]  03/06/2022, 1:57 AM

## 2022-03-06 NOTE — Progress Notes (Signed)
ANTICOAGULATION CONSULT NOTE   Pharmacy Consult for heparin  Indication: chest pain/ACS  No Known Allergies   Vital Signs: Temp: 98 F (36.7 C) (01/12 1341) Temp Source: Oral (01/12 0255) BP: 105/69 (01/12 1300) Pulse Rate: 80 (01/12 1300)  Labs: Recent Labs    03/05/22 1830 03/05/22 2021 03/06/22 0434 03/06/22 1322  HGB  --  11.8* 13.4  --   HCT  --  34.2* 38.9*  --   PLT  --  94* 118*  --   HEPARINUNFRC  --   --  1.01* 0.63  CREATININE  --  1.50*  --   --   TROPONINIHS 25* 137*  --   --      CrCl cannot be calculated (Unknown ideal weight.).   Medical History: Past Medical History:  Diagnosis Date   Blind right eye    Carotid artery disease ([place])    04/01/20: 60-79% RICA stenosis, 1-82% LICA stenosis   Chronic kidney disease    ckd stage 3 a per dr Malva Limes 02-05-2021 epic   Coronary artery disease    NSTEMI s/p CABG in 03/2020 (LIMA-->LAD/diag, L radial-->OM1/OM3); b.   DM type 2    Glaucoma of right eye, unspecified glaucoma type    HFrEF (heart failure with reduced ejection fraction) ([place])    Echo 6/22: EF 45, global HK, mild MR, RVSP 31.9   Hypertension    Ischemic cardiomyopathy    PAD (peripheral artery disease) ([place])    occluded right axillary artery 03/29/20     Assessment: Presents to ED with chest pain. Concern for ACS. No AC PTA. Pharmacy consulted to dose heparin.   Heparin level came back therapeutic at 0.63, on 800 units/hr. No s/sx of bleeding or infusion issues.   Cardiology recommending repeat troponin level and could consider discontinue heparin if relatively flat.  Goal of Therapy:  Heparin level 0.3-0.7 units/ml Monitor platelets by anticoagulation protocol: Yes   Plan:  Continue heparin at 800 units/hr  Check heparin level in 6 hr Monitor daily HL, CBC, and for s/sx of bleeding   Antonietta Jewel, PharmD, [place] Pharmacist  Phone: [phone] 03/06/2022 2:07 PM  Please check AMION for all [place] phone  numbers After 10:00 PM, call [place] [phone]

## 2022-03-06 NOTE — Progress Notes (Deleted)
PROGRESS NOTE    Curtis Watkins  QVZ:[id number] DOB: Nov 29, 1955 DOA: 03/05/2022 PCP: Camillia Herter, NP    Chief Complaint  Patient presents with   Weakness   Chest Pain   Nausea    Brief Narrative:   This is no charge note as patient was seen and admitted earlier today by Dr. Nevada Crane, chart, imaging and labs were reviewed, patient was seen and examined.  Curtis Watkins is a 67 y.o. male with medical history significant for coronary artery disease status post CABG, combined diastolic and systolic CHF, type 2 diabetes, diabetic foot ulcer, hypertension, who initially presented to urgent care due to generalized weakness and feeling unwell for the past 2 weeks.  At the urgent care, initially hypotensive and hypoxic with SBP less than 90 and O2 saturation in the 80s.  He was sent to [facility] ED for further evaluation.  Influenza A+ by PCR.       Assessment & Plan:   Principal Problem:   Generalized weakness  NSTEMI -Cardiology input greatly appreciated, continue with aspirin, lingual nitro as needed for chest pain, continue with statin as well. -Remains on heparin GTT, ACS dosing -Follow 2D echo - n.p.o. for possible cardiac cath today, will await further recommendations from cardiology   Generalized weakness likely secondary to influenza A viral infection Unclear when flulike symptoms began, influenza a PCR positive on 03/05/2022. Started Tamiflu   Mild non anion gap metabolic acidosis Serum bicarb 18 Anion gap 12 Continue with IVF   HFrEF 45% Last 2D echo done on 08/20/2020 revealed LVEF 45%. Follow repeat 2D echo Start strict I's and O's and daily weight   Coronary artery disease status post CABG Resume home aspirin, Lipitor. Denies any chest pain at the time of this visit.   Hyperlipidemia Resume home Lipitor   Hypertension, BPs are soft Hold off home oral antihypertensives Closely monitor vital signs   CKD 3B Appears to be at her baseline creatinine 1.5 with GFR  57. Avoid nephrotoxic agents, and hypotension. Monitor urine output with strict I's and O's.   Hyperglycemia Obtain hemoglobin A1c Start insulin sliding scale.    DVT prophylaxis: currently on Heparin GTT Code Status: Full Family Communication: None at bedside Disposition:   Status is: Inpatient    Consultants:  cardiology   Subjective:  Reports cough, congestion, generalized weakness and body ache, he denies any chest pain,  Objective: Vitals:   03/06/22 0800 03/06/22 0954 03/06/22 1000 03/06/22 1030  BP: 99/71  119/83 110/76  Pulse: 66  64 73  Resp: (!) 22  14 (!) 25  Temp:  98.6 F (37 C)    TempSrc:      SpO2: 98%  100% 99%   No intake or output data in the 24 hours ending 03/06/22 1147 There were no vitals filed for this visit.  Examination:  Awake Alert, Oriented X 3, No new F.N deficits, Normal affect Symmetrical Chest wall movement, Good air movement bilaterally, CTAB RRR,No Gallops,Rubs or new Murmurs, No Parasternal Heave +ve B.Sounds, Abd Soft, No tenderness, No rebound - guarding or rigidity. No Cyanosis, Clubbing or edema, No new Rash or bruise       Data Reviewed: I have personally reviewed following labs and imaging studies  CBC: Recent Labs  Lab 03/05/22 2021 03/06/22 0434  WBC 10.6* 11.3*  HGB 11.8* 13.4  HCT 34.2* 38.9*  MCV 90.0 90.0  PLT 94* 118*    Basic Metabolic Panel: Recent Labs  Lab 03/05/22 2021  NA 134*  K 4.5  CL 104  CO2 18*  GLUCOSE 123*  BUN 32*  CREATININE 1.50*  CALCIUM 8.1*    GFR: CrCl cannot be calculated (Unknown ideal weight.).  Liver Function Tests: No results for input(s): "AST", "ALT", "ALKPHOS", "BILITOT", "PROT", "ALBUMIN" in the last 168 hours.  CBG: Recent Labs  Lab 03/05/22 1558  GLUCAP 129*     Recent Results (from the past 240 hour(s))  Resp panel by RT-PCR (RSV, Flu A&B, Covid) Anterior Nasal Swab     Status: Abnormal   Collection Time: 03/05/22  5:07 PM   Specimen:  Anterior Nasal Swab  Result Value Ref Range Status   SARS Coronavirus 2 by RT PCR NEGATIVE NEGATIVE Final    Comment: (NOTE) SARS-CoV-2 target nucleic acids are NOT DETECTED.  The SARS-CoV-2 RNA is generally detectable in upper respiratory specimens during the acute phase of infection. The lowest concentration of SARS-CoV-2 viral copies this assay can detect is 138 copies/mL. A negative result does not preclude SARS-Cov-2 infection and should not be used as the sole basis for treatment or other patient management decisions. A negative result may occur with  improper specimen collection/handling, submission of specimen other than nasopharyngeal swab, presence of viral mutation(s) within the areas targeted by this assay, and inadequate number of viral copies(<138 copies/mL). A negative result must be combined with clinical observations, patient history, and epidemiological information. The expected result is Negative.  Fact Sheet for Patients:  BloggerCourse.com  Fact Sheet for Healthcare Providers:  SeriousBroker.it  This test is no t yet approved or cleared by the [place] FDA and  has been authorized for detection and/or diagnosis of SARS-CoV-2 by FDA under an Emergency Use Authorization (EUA). This EUA will remain  in effect (meaning this test can be used) for the duration of the COVID-19 declaration under Section 564(b)(1) of the Act, 21 U.S.C.section 360bbb-3(b)(1), unless the authorization is terminated  or revoked sooner.       Influenza A by PCR POSITIVE (A) NEGATIVE Final   Influenza B by PCR NEGATIVE NEGATIVE Final    Comment: (NOTE) The Xpert Xpress SARS-CoV-2/FLU/RSV plus assay is intended as an aid in the diagnosis of influenza from Nasopharyngeal swab specimens and should not be used as a sole basis for treatment. Nasal washings and aspirates are unacceptable for Xpert Xpress  SARS-CoV-2/FLU/RSV testing.  Fact Sheet for Patients: BloggerCourse.com  Fact Sheet for Healthcare Providers: SeriousBroker.it  This test is not yet approved or cleared by the [place] FDA and has been authorized for detection and/or diagnosis of SARS-CoV-2 by FDA under an Emergency Use Authorization (EUA). This EUA will remain in effect (meaning this test can be used) for the duration of the COVID-19 declaration under Section 564(b)(1) of the Act, 21 U.S.C. section 360bbb-3(b)(1), unless the authorization is terminated or revoked.     Resp Syncytial Virus by PCR NEGATIVE NEGATIVE Final    Comment: (NOTE) Fact Sheet for Patients: BloggerCourse.com  Fact Sheet for Healthcare Providers: SeriousBroker.it  This test is not yet approved or cleared by the [place] FDA and has been authorized for detection and/or diagnosis of SARS-CoV-2 by FDA under an Emergency Use Authorization (EUA). This EUA will remain in effect (meaning this test can be used) for the duration of the COVID-19 declaration under Section 564(b)(1) of the Act, 21 U.S.C. section 360bbb-3(b)(1), unless the authorization is terminated or revoked.  Performed at [facility], [address], [place], [place] [address]  Radiology Studies: DG Chest 2 View  Result Date: 03/05/2022 CLINICAL DATA:  Chest pain.  Hypoxia and hypotension EXAM: CHEST - 2 VIEW COMPARISON:  06/20/2020 x-ray FINDINGS: Sternal wires. The right inferior costophrenic angles cut off the edge of the film. Mild left basilar scar or atelectasis. No pneumothorax or edema. Normal cardiopericardial silhouette. Degenerative changes along the spine IMPRESSION: Postop chest.  Mild basilar scar or atelectasis. The right inferior costophrenic angles clipped at the edge of the film. Electronically Signed   By: Jill Side M.D.   On:  03/05/2022 17:38        Scheduled Meds:  aspirin EC  162.5 mg Oral Daily   atorvastatin  80 mg Oral Daily   oseltamivir  30 mg Oral BID   Continuous Infusions:  heparin 800 Units/hr (03/06/22 0628)     LOS: 1 day       Phillips Climes, MD [facility]   To contact the attending provider between 7A-7P or the covering provider during after hours 7P-7A, please log into the web site [email or web address] and access using universal [place] password for that web site. If you do not have the password, please call the hospital operator.  03/06/2022, 11:47 AM

## 2022-03-06 NOTE — Discharge Summary (Signed)
Physician Discharge Summary  TOMAS SCHAMP ENI:[id number] DOB: Aug 10, 1955 DOA: 03/05/2022  PCP: Camillia Herter, NP  Admit date: 03/05/2022 Discharge date: 03/06/2022   Recommendations for Outpatient Follow-up:  Follow up with PCP in 1-2 weeks Please obtain BMP/CBC in one week To follow-up with cardiology, appointment scheduled for 03/23/2022    Discharge Condition: (Stable)  Brief/Interim Summary:  Curtis Watkins is a 67 y.o. male with medical history significant for coronary artery disease status post CABG, combined diastolic and systolic CHF, type 2 diabetes, diabetic foot ulcer, hypertension, who initially presented to urgent care due to generalized weakness and feeling unwell for the past 2 weeks.  At the urgent care, initially hypotensive and hypoxic with SBP less than 90 and O2 saturation in the 80s.  He was sent to [facility] ED for further evaluation.  Influenza A+ by PCR.   As well patient was noted to have elevated troponin, so he was seen by cardiology   Elevated troponins -Is most likely in the setting of demand ischemia, not ACS, cardiology input greatly appreciated, he was initially/empirically on heparin GTT, opponents mildly trending up, most likely in the setting of demand ischemia, not ACS 25>>137>>160, he never had chest pain, IV heparin's were discontinued, he will be discharged on his home regimen of aspirin, statin, losartan and metoprolol -Patient to follow-up with cardiology as an outpatient, appointment has been scheduled for 1/29 -2D echo has been obtained which was stable when compared to the echo in jun 2022, EF 45%, with global hypokinesis    Generalized weakness likely secondary to influenza A viral infection Unclear when flulike symptoms began, influenza a PCR positive on 03/05/2022. 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  Repeat echo is stable with no significant changes in EF or global hypokinesis Continue with aspirin, Lipitor -Given soft blood pressure, he was recommended to resume his metoprolol, Aldactone, Lasix and losartan gradually over the next few days    Hyperlipidemia Resume home Lipitor   Hypertension, BPs are soft Antihypertensive meds has been held currently, please see above discussion regarding resume gradually   CKD 3B Appears to be at her baseline creatinine 1.5 with GFR 57. Avoid nephrotoxic agents, and hypotension. Monitor urine output with strict I's and O's.   Hyperglycemia Continue with Jardiance  Discharge Diagnoses:  Principal Problem:   Generalized weakness Active Problems:   DM2 (diabetes mellitus, type 2) ([place])   Coronary artery disease involving native coronary artery of native heart with unstable angina pectoris (HCC)   Influenza   Elevated troponin    Discharge Instructions  Discharge Instructions     Diet - low sodium heart healthy   Complete by: As directed    Discharge instructions   Complete by: As directed    Follow with Primary MD Camillia Herter, NP in 7 days   Get CBC, CMP,  checked  by Primary MD next visit.    Activity: As tolerated with Full fall precautions use walker/cane & assistance as needed   Disposition Home    Diet: Heart Healthy.   On your next visit with your primary care physician please Get Medicines reviewed and adjusted.   Please request your Prim.MD to go over all Hospital Tests and Procedure/Radiological results at the follow up, please get all Hospital records sent to your Prim MD  by signing hospital release before you go home.   If you experience worsening of your admission symptoms, develop shortness of breath, life threatening emergency, suicidal or homicidal thoughts you must seek medical attention immediately by calling 911 or calling your MD immediately  if symptoms less  severe.  You Must read complete instructions/literature along with all the possible adverse reactions/side effects for all the Medicines you take and that have been prescribed to you. Take any new Medicines after you have completely understood and accpet all the possible adverse reactions/side effects.   Do not drive, operating heavy machinery, perform activities at heights, swimming or participation in water activities or provide baby sitting services if your were admitted for syncope or siezures until you have seen by Primary MD or a Neurologist and advised to do so again.  Do not drive when taking Pain medications.    Do not take more than prescribed Pain, Sleep and Anxiety Medications  Special Instructions: If you have smoked or chewed Tobacco  in the last 2 yrs please stop smoking, stop any regular Alcohol  and or any Recreational drug use.  Wear Seat belts while driving.   Please note  You were cared for by a hospitalist during your hospital stay. If you have any questions about your discharge medications or the care you received while you were in the hospital after you are discharged, you can call the unit and asked to speak with the hospitalist on call if the hospitalist that took care of you is not available. Once you are discharged, your primary care physician will handle any further medical issues. Please note that NO REFILLS for any discharge medications will be authorized once you are discharged, as it is imperative that you return to your primary care physician (or establish a relationship with a primary care physician if you do not have one) for your aftercare needs so that they can reassess your need for medications and monitor your lab values.   Increase activity slowly   Complete by: As directed       Allergies as of 03/06/2022   No Known Allergies      Medication List     STOP taking these medications    Ozempic (0.25 or 0.5 MG/DOSE) 2 MG/3ML Sopn Generic drug:  Semaglutide(0.25 or 0.5MG /DOS)       TAKE these medications    aspirin EC 325 MG tablet Take 162.5 mg by mouth daily.   atorvastatin 80 MG tablet Commonly known as: LIPITOR Take 1 tablet (80 mg total) by mouth daily.   blood glucose meter kit and supplies by Other route as directed. Dispense based on patient and insurance preference. Use up to four times daily as directed. (FOR ICD-10 E10.9, E11.9).   brimonidine 0.2 % ophthalmic solution Commonly known as: ALPHAGAN Place 1 drop into the left eye 2 (two) times daily.   dorzolamide-timolol 2-0.5 % ophthalmic solution Commonly known as: COSOPT Place 1 drop into the left eye 2 (two) times daily.   empagliflozin 25 MG Tabs tablet Commonly known as: JARDIANCE Take 1 tablet (25 mg total) by mouth daily before breakfast. Start taking on: March 10, 2022 What changed: These instructions start on March 10, 2022. If you are unsure what to do until then, ask your doctor or other care provider.   furosemide 20 MG tablet Commonly known as: LASIX Take 1 tablet (20 mg total) by mouth 3 (three) times a week. Take on Mondays, Wednesdays, and Fridays. Start taking on: March 08, 2022   HYDROcodone-acetaminophen 5-325 MG tablet Commonly known as: NORCO/VICODIN Take 1 tablet by mouth every 4 (four) hours as needed.   losartan 25 MG tablet Commonly known as: COZAAR Take 0.5 tablets (12.5 mg total) by mouth daily. Start taking on: March 10, 2022 What changed: These instructions start on March 10, 2022. If you are unsure what to do until then, ask your doctor or other care provider.   metoprolol succinate 25 MG 24 hr tablet Commonly known as: Toprol XL Take 1.5 tablets (37.5 mg total) by mouth daily. Start taking on: March 08, 2022 What changed: These instructions start on March 08, 2022. If you are unsure what to do until then, ask your doctor or other care provider.   oseltamivir 30 MG capsule Commonly known as:  TAMIFLU Take 1 capsule (30 mg total) by mouth 2 (two) times daily.   spironolactone 25 MG tablet Commonly known as: ALDACTONE Take 0.5 tablets (12.5 mg total) by mouth daily. Start taking on: March 10, 2022 What changed: These instructions start on March 10, 2022. If you are unsure what to do until then, ask your doctor or other care provider.        Follow-up Information     Richardson Dopp T, PA-C Follow up.   Specialties: Cardiology, Physician Assistant Why: [place] location - cardiology follow-up scheduled on Monday January 29 at 10:55 AM (Arrive by 10:40 AM). Contact information: [id number] [place] [address] [phone]                No Known Allergies  Consultations:  Cardiology   Procedures/Studies: ECHOCARDIOGRAM COMPLETE  Result Date: 03/06/2022    ECHOCARDIOGRAM REPORT   Patient Name:   Curtis Watkins Date of Exam: 03/06/2022 Medical Rec #:  [id number]   Height:       70.0 in Accession #:    [id number]  Weight:       164.4 lb Date of Birth:  06-24-1955  BSA:          1.920 m Patient Age:    7 years    BP:           105/68 mmHg Patient Gender: M           HR:           66 bpm. Exam Location:  Inpatient Procedure: 2D Echo and Strain Analysis Indications:    elevated troponin  History:        Patient has prior history of Echocardiogram examinations, most                 recent 03/28/2020. CAD; Risk Factors:Hypertension and Diabetes.  Sonographer:    Harvie Junior Referring Phys: [id number] CAROLE N HALL  Sonographer Comments: Global longitudinal strain was attempted. IMPRESSIONS  1. Left ventricular ejection fraction, by estimation, is 45 to 50%. The left ventricle has mildly decreased function. The left ventricle demonstrates global hypokinesis. Left ventricular diastolic parameters were normal. The average left ventricular global longitudinal strain is -17.8 %. The global longitudinal strain is abnormal.  2. Right ventricular  systolic function is normal. The right ventricular size is normal. There is normal pulmonary artery systolic pressure. The estimated right ventricular systolic pressure is 6.0 mmHg.  3. The mitral valve is grossly normal. Mild mitral valve regurgitation. No evidence of mitral stenosis.  4. The aortic valve is tricuspid. Aortic valve regurgitation is not visualized. No aortic stenosis is present.  5.  The inferior vena cava is normal in size with greater than 50% respiratory variability, suggesting right atrial pressure of 3 mmHg. Comparison(s): No significant change from prior study. FINDINGS  Left Ventricle: Left ventricular ejection fraction, by estimation, is 45 to 50%. The left ventricle has mildly decreased function. The left ventricle demonstrates global hypokinesis. The average left ventricular global longitudinal strain is -17.8 %. The global longitudinal strain is abnormal. The left ventricular internal cavity size was normal in size. There is no left ventricular hypertrophy. Abnormal (paradoxical) septal motion consistent with post-operative status. Left ventricular diastolic parameters were normal. Right Ventricle: The right ventricular size is normal. No increase in right ventricular wall thickness. Right ventricular systolic function is normal. There is normal pulmonary artery systolic pressure. The tricuspid regurgitant velocity is 0.87 m/s, and  with an assumed right atrial pressure of 3 mmHg, the estimated right ventricular systolic pressure is 6.0 mmHg. Left Atrium: Left atrial size was normal in size. Right Atrium: Right atrial size was normal in size. Pericardium: There is no evidence of pericardial effusion. Mitral Valve: The mitral valve is grossly normal. Mild mitral valve regurgitation. No evidence of mitral valve stenosis. Tricuspid Valve: The tricuspid valve is grossly normal. Tricuspid valve regurgitation is trivial. No evidence of tricuspid stenosis. Aortic Valve: The aortic valve is  tricuspid. Aortic valve regurgitation is not visualized. No aortic stenosis is present. Aortic valve mean gradient measures 2.0 mmHg. Aortic valve peak gradient measures 4.5 mmHg. Aortic valve area, by VTI measures 2.61 cm. Pulmonic Valve: The pulmonic valve was not well visualized. Pulmonic valve regurgitation is not visualized. No evidence of pulmonic stenosis. Aorta: The aortic root is normal in size and structure. Venous: The inferior vena cava is normal in size with greater than 50% respiratory variability, suggesting right atrial pressure of 3 mmHg. IAS/Shunts: The atrial septum is grossly normal.  LEFT VENTRICLE PLAX 2D LVIDd:         4.80 cm      Diastology LVIDs:         3.50 cm      LV e' medial:    7.72 cm/s LV PW:         1.10 cm      LV E/e' medial:  9.5 LV IVS:        1.10 cm      LV e' lateral:   9.14 cm/s LVOT diam:     2.10 cm      LV E/e' lateral: 8.0 LV SV:         59 LV SV Index:   31           2D Longitudinal Strain LVOT Area:     3.46 cm     2D Strain GLS Avg:     -17.8 %  LV Volumes (MOD) LV vol d, MOD A2C: 162.0 ml LV vol d, MOD A4C: 120.0 ml LV vol s, MOD A2C: 107.0 ml LV vol s, MOD A4C: 62.2 ml LV SV MOD A2C:     55.0 ml LV SV MOD A4C:     120.0 ml LV SV MOD BP:      57.0 ml RIGHT VENTRICLE RV Basal diam:  2.90 cm RV Mid diam:    2.60 cm RV S prime:     9.57 cm/s TAPSE (M-mode): 1.6 cm LEFT ATRIUM             Index        RIGHT ATRIUM  Index LA diam:        3.10 cm 1.61 cm/m   RA Area:     8.87 cm LA Vol (A2C):   51.2 ml 26.66 ml/m  RA Volume:   17.70 ml 9.22 ml/m LA Vol (A4C):   31.0 ml 16.14 ml/m LA Biplane Vol: 42.8 ml 22.29 ml/m  AORTIC VALVE AV Area (Vmax):    2.66 cm AV Area (Vmean):   2.50 cm AV Area (VTI):     2.61 cm AV Vmax:           106.00 cm/s AV Vmean:          70.800 cm/s AV VTI:            0.226 m AV Peak Grad:      4.5 mmHg AV Mean Grad:      2.0 mmHg LVOT Vmax:         81.30 cm/s LVOT Vmean:        51.100 cm/s LVOT VTI:          0.170 m LVOT/AV VTI  ratio: 0.75  AORTA Ao Root diam: 3.60 cm MITRAL VALVE               TRICUSPID VALVE MV Area (PHT): 3.91 cm    TR Peak grad:   3.0 mmHg MV Decel Time: 194 msec    TR Vmax:        87.10 cm/s MR Peak grad: 123.8 mmHg MR Vmax:      556.40 cm/s  SHUNTS MV E velocity: 73.40 cm/s  Systemic VTI:  0.17 m MV A velocity: 74.40 cm/s  Systemic Diam: 2.10 cm MV E/A ratio:  0.99 Eleonore Chiquito MD Electronically signed by Eleonore Chiquito MD Signature Date/Time: 03/06/2022/1:16:21 PM    Final    DG Chest 2 View  Result Date: 03/05/2022 CLINICAL DATA:  Chest pain.  Hypoxia and hypotension EXAM: CHEST - 2 VIEW COMPARISON:  06/20/2020 x-ray FINDINGS: Sternal wires. The right inferior costophrenic angles cut off the edge of the film. Mild left basilar scar or atelectasis. No pneumothorax or edema. Normal cardiopericardial silhouette. Degenerative changes along the spine IMPRESSION: Postop chest.  Mild basilar scar or atelectasis. The right inferior costophrenic angles clipped at the edge of the film. Electronically Signed   By: Jill Side M.D.   On: 03/05/2022 17:38     Subjective: Reports cough, congestion, generalized weakness and body ache, he denies any chest pain,   Discharge Exam: Vitals:   03/06/22 1341 03/06/22 1540  BP:  121/73  Pulse:  82  Resp:  18  Temp: 98 F (36.7 C)   SpO2:  98%   Vitals:   03/06/22 1230 03/06/22 1300 03/06/22 1341 03/06/22 1540  BP: 101/68 105/69  121/73  Pulse: 78 80  82  Resp: 18 18  18   Temp:   98 F (36.7 C)   TempSrc:      SpO2: 99% 97%  98%    General: Pt is alert, awake, not in acute distress Cardiovascular: RRR, S1/S2 +, no rubs, no gallops Respiratory: CTA bilaterally, no wheezing, no rhonchi Abdominal: Soft, NT, ND, bowel sounds + Extremities: no edema, no cyanosis    The results of significant diagnostics from this hospitalization (including imaging, microbiology, ancillary and laboratory) are listed below for reference.     Microbiology: Recent  Results (from the past 240 hour(s))  Resp panel by RT-PCR (RSV, Flu A&B, Covid) Anterior Nasal Swab     Status: Abnormal  Collection Time: 03/05/22  5:07 PM   Specimen: Anterior Nasal Swab  Result Value Ref Range Status   SARS Coronavirus 2 by RT PCR NEGATIVE NEGATIVE Final    Comment: (NOTE) SARS-CoV-2 target nucleic acids are NOT DETECTED.  The SARS-CoV-2 RNA is generally detectable in upper respiratory specimens during the acute phase of infection. The lowest concentration of SARS-CoV-2 viral copies this assay can detect is 138 copies/mL. A negative result does not preclude SARS-Cov-2 infection and should not be used as the sole basis for treatment or other patient management decisions. A negative result may occur with  improper specimen collection/handling, submission of specimen other than nasopharyngeal swab, presence of viral mutation(s) within the areas targeted by this assay, and inadequate number of viral copies(<138 copies/mL). A negative result must be combined with clinical observations, patient history, and epidemiological information. The expected result is Negative.  Fact Sheet for Patients:  EntrepreneurPulse.com.au  Fact Sheet for Healthcare Providers:  IncredibleEmployment.be  This test is no t yet approved or cleared by the [place] FDA and  has been authorized for detection and/or diagnosis of SARS-CoV-2 by FDA under an Emergency Use Authorization (EUA). This EUA will remain  in effect (meaning this test can be used) for the duration of the COVID-19 declaration under Section 564(b)(1) of the Act, 21 U.S.C.section 360bbb-3(b)(1), unless the authorization is terminated  or revoked sooner.       Influenza A by PCR POSITIVE (A) NEGATIVE Final   Influenza B by PCR NEGATIVE NEGATIVE Final    Comment: (NOTE) The Xpert Xpress SARS-CoV-2/FLU/RSV plus assay is intended as an aid in the diagnosis of influenza from  Nasopharyngeal swab specimens and should not be used as a sole basis for treatment. Nasal washings and aspirates are unacceptable for Xpert Xpress SARS-CoV-2/FLU/RSV testing.  Fact Sheet for Patients: EntrepreneurPulse.com.au  Fact Sheet for Healthcare Providers: IncredibleEmployment.be  This test is not yet approved or cleared by the [place] FDA and has been authorized for detection and/or diagnosis of SARS-CoV-2 by FDA under an Emergency Use Authorization (EUA). This EUA will remain in effect (meaning this test can be used) for the duration of the COVID-19 declaration under Section 564(b)(1) of the Act, 21 U.S.C. section 360bbb-3(b)(1), unless the authorization is terminated or revoked.     Resp Syncytial Virus by PCR NEGATIVE NEGATIVE Final    Comment: (NOTE) Fact Sheet for Patients: EntrepreneurPulse.com.au  Fact Sheet for Healthcare Providers: IncredibleEmployment.be  This test is not yet approved or cleared by the [place] FDA and has been authorized for detection and/or diagnosis of SARS-CoV-2 by FDA under an Emergency Use Authorization (EUA). This EUA will remain in effect (meaning this test can be used) for the duration of the COVID-19 declaration under Section 564(b)(1) of the Act, 21 U.S.C. section 360bbb-3(b)(1), unless the authorization is terminated or revoked.  Performed at [facility], [place] [address] [place], [place] [address]      Labs: BNP (last 3 results) No results for input(s): "BNP" in the last 8760 hours. Basic Metabolic Panel: Recent Labs  Lab 03/05/22 2021  NA 134*  K 4.5  CL 104  CO2 18*  GLUCOSE 123*  BUN 32*  CREATININE 1.50*  CALCIUM 8.1*   Liver Function Tests: No results for input(s): "AST", "ALT", "ALKPHOS", "BILITOT", "PROT", "ALBUMIN" in the last 168 hours. No results for input(s): "LIPASE", "AMYLASE" in the last 168 hours. No results  for input(s): "AMMONIA" in the last 168 hours. CBC: Recent Labs  Lab  03/05/22 2021 03/06/22 0434  WBC 10.6* 11.3*  HGB 11.8* 13.4  HCT 34.2* 38.9*  MCV 90.0 90.0  PLT 94* 118*   Cardiac Enzymes: No results for input(s): "CKTOTAL", "CKMB", "CKMBINDEX", "TROPONINI" in the last 168 hours. BNP: Invalid input(s): "POCBNP" CBG: Recent Labs  Lab 03/05/22 1558  GLUCAP 129*   D-Dimer No results for input(s): "DDIMER" in the last 72 hours. Hgb A1c No results for input(s): "HGBA1C" in the last 72 hours. Lipid Profile No results for input(s): "CHOL", "HDL", "LDLCALC", "TRIG", "CHOLHDL", "LDLDIRECT" in the last 72 hours. Thyroid function studies No results for input(s): "TSH", "T4TOTAL", "T3FREE", "THYROIDAB" in the last 72 hours.  Invalid input(s): "FREET3" Anemia work up No results for input(s): "VITAMINB12", "FOLATE", "FERRITIN", "TIBC", "IRON", "RETICCTPCT" in the last 72 hours. Urinalysis    Component Value Date/Time   COLORURINE YELLOW 04/02/2020 0501   APPEARANCEUR CLEAR 04/02/2020 0501   LABSPEC 1.015 04/02/2020 0501   PHURINE 6.0 04/02/2020 0501   GLUCOSEU NEGATIVE 04/02/2020 0501   HGBUR MODERATE (A) 04/02/2020 0501   BILIRUBINUR NEGATIVE 04/02/2020 0501   KETONESUR NEGATIVE 04/02/2020 0501   PROTEINUR 100 (A) 04/02/2020 0501   NITRITE NEGATIVE 04/02/2020 0501   LEUKOCYTESUR NEGATIVE 04/02/2020 0501   Sepsis Labs Recent Labs  Lab 03/05/22 2021 03/06/22 0434  WBC 10.6* 11.3*   Microbiology Recent Results (from the past 240 hour(s))  Resp panel by RT-PCR (RSV, Flu A&B, Covid) Anterior Nasal Swab     Status: Abnormal   Collection Time: 03/05/22  5:07 PM   Specimen: Anterior Nasal Swab  Result Value Ref Range Status   SARS Coronavirus 2 by RT PCR NEGATIVE NEGATIVE Final    Comment: (NOTE) SARS-CoV-2 target nucleic acids are NOT DETECTED.  The SARS-CoV-2 RNA is generally detectable in upper respiratory specimens during the acute phase of infection. The  lowest concentration of SARS-CoV-2 viral copies this assay can detect is 138 copies/mL. A negative result does not preclude SARS-Cov-2 infection and should not be used as the sole basis for treatment or other patient management decisions. A negative result may occur with  improper specimen collection/handling, submission of specimen other than nasopharyngeal swab, presence of viral mutation(s) within the areas targeted by this assay, and inadequate number of viral copies(<138 copies/mL). A negative result must be combined with clinical observations, patient history, and epidemiological information. The expected result is Negative.  Fact Sheet for Patients:  BloggerCourse.com  Fact Sheet for Healthcare Providers:  SeriousBroker.it  This test is no t yet approved or cleared by the [place] FDA and  has been authorized for detection and/or diagnosis of SARS-CoV-2 by FDA under an Emergency Use Authorization (EUA). This EUA will remain  in effect (meaning this test can be used) for the duration of the COVID-19 declaration under Section 564(b)(1) of the Act, 21 U.S.C.section 360bbb-3(b)(1), unless the authorization is terminated  or revoked sooner.       Influenza A by PCR POSITIVE (A) NEGATIVE Final   Influenza B by PCR NEGATIVE NEGATIVE Final    Comment: (NOTE) The Xpert Xpress SARS-CoV-2/FLU/RSV plus assay is intended as an aid in the diagnosis of influenza from Nasopharyngeal swab specimens and should not be used as a sole basis for treatment. Nasal washings and aspirates are unacceptable for Xpert Xpress SARS-CoV-2/FLU/RSV testing.  Fact Sheet for Patients: BloggerCourse.com  Fact Sheet for Healthcare Providers: SeriousBroker.it  This test is not yet approved or cleared by the [place] FDA and has been authorized for detection and/or diagnosis of  SARS-CoV-2 by FDA  under an Emergency Use Authorization (EUA). This EUA will remain in effect (meaning this test can be used) for the duration of the COVID-19 declaration under Section 564(b)(1) of the Act, 21 U.S.C. section 360bbb-3(b)(1), unless the authorization is terminated or revoked.     Resp Syncytial Virus by PCR NEGATIVE NEGATIVE Final    Comment: (NOTE) Fact Sheet for Patients: EntrepreneurPulse.com.au  Fact Sheet for Healthcare Providers: IncredibleEmployment.be  This test is not yet approved or cleared by the [place] FDA and has been authorized for detection and/or diagnosis of SARS-CoV-2 by FDA under an Emergency Use Authorization (EUA). This EUA will remain in effect (meaning this test can be used) for the duration of the COVID-19 declaration under Section 564(b)(1) of the Act, 21 U.S.C. section 360bbb-3(b)(1), unless the authorization is terminated or revoked.  Performed at [facility], [place] [address], [place], [place] [address]      Time coordinating discharge: Over 30 minutes  SIGNED:   Phillips Climes, MD  [facility] 03/06/2022, 4:53 PM Pager   If 7PM-7AM, please contact night-coverage [email or web address]

## 2022-03-16 ENCOUNTER — Ambulatory Visit (INDEPENDENT_AMBULATORY_CARE_PROVIDER_SITE_OTHER): Payer: 59 | Admitting: Orthopedic Surgery

## 2022-03-16 ENCOUNTER — Encounter: Payer: Self-pay | Admitting: Orthopedic Surgery

## 2022-03-16 DIAGNOSIS — L97521 Non-pressure chronic ulcer of other part of left foot limited to breakdown of skin: Secondary | ICD-10-CM

## 2022-03-16 DIAGNOSIS — Z89431 Acquired absence of right foot: Secondary | ICD-10-CM | POA: Diagnosis not present

## 2022-03-17 DIAGNOSIS — E1136 Type 2 diabetes mellitus with diabetic cataract: Secondary | ICD-10-CM | POA: Diagnosis not present

## 2022-03-17 DIAGNOSIS — E113212 Type 2 diabetes mellitus with mild nonproliferative diabetic retinopathy with macular edema, left eye: Secondary | ICD-10-CM | POA: Diagnosis not present

## 2022-03-17 DIAGNOSIS — Z7984 Long term (current) use of oral hypoglycemic drugs: Secondary | ICD-10-CM | POA: Diagnosis not present

## 2022-03-17 DIAGNOSIS — H40051 Ocular hypertension, right eye: Secondary | ICD-10-CM | POA: Diagnosis not present

## 2022-03-17 DIAGNOSIS — H2511 Age-related nuclear cataract, right eye: Secondary | ICD-10-CM | POA: Diagnosis not present

## 2022-03-17 DIAGNOSIS — H5461 Unqualified visual loss, right eye, normal vision left eye: Secondary | ICD-10-CM | POA: Diagnosis not present

## 2022-03-18 ENCOUNTER — Encounter: Payer: Self-pay | Admitting: Orthopedic Surgery

## 2022-03-18 ENCOUNTER — Other Ambulatory Visit: Payer: Self-pay | Admitting: *Deleted

## 2022-03-18 MED ORDER — SPIRONOLACTONE 25 MG PO TABS: 12.5000 mg | ORAL_TABLET | Freq: Every day | ORAL | 1 refills | Status: DC

## 2022-03-18 NOTE — Progress Notes (Signed)
Office Visit Note   Patient: Curtis Watkins           Date of Birth: 09-27-1955           MRN: [id number] Visit Date: 03/16/2022              Requested by: Rema Fendt, NP [address] [place],  [place] [address] PCP: Rema Fendt, NP  Chief Complaint  Patient presents with   Right Foot - Follow-up    Right foot transmet 10/22/2021   Left Foot - Follow-up      HPI: Patient is a 67 year old gentleman who is seen for ulceration left foot and a right transmetatarsal amputation.  Patient is 5 months status post right transmetatarsal amputation.  Assessment & Plan: Visit Diagnoses:  1. Ulcer of left foot, limited to breakdown of skin (HCC)   2. History of transmetatarsal amputation of right foot (HCC)     Plan: Ulcers debrided on the left foot.  Nails trimmed x 4.  Follow-Up Instructions: Return in about 4 weeks (around 04/13/2022).   Ortho Exam  Patient is alert, oriented, no adenopathy, well-dressed, normal affect, normal respiratory effort. Examination patient has a stable right transmetatarsal amputation.  On the left foot patient has thickened discolored onychomycotic nails x 4 he is unable to safely trim on his own and the nails were trimmed x 4.  Patient has 2 large Wagner grade 1 ulcers on the left foot beneath the first and third metatarsal head.  After informed consent a 10 blade knife was used to debride the skin and soft tissue back to healthy viable granulation tissue there is no tunneling no exposed bone or tendon.  The first metatarsal head ulcer is 3 cm in diameter 3 mm deep and the third metatarsal head ulcer is 2 cm in diameter and 2 mm deep after debridement.  Imaging: No results found. No images are attached to the encounter.  Labs: Lab Results  Component Value Date   HGBA1C 6.9 (A) 11/28/2021   HGBA1C 6.7 (A) 02/07/2021   HGBA1C 6.7 (A) 11/01/2020     Lab Results  Component Value Date   ALBUMIN 4.0 08/02/2020    Lab Results   Component Value Date   MG 2.6 (H) 04/04/2020   MG 2.6 (H) 04/04/2020   MG 2.9 (H) 04/03/2020   No results found for: "VD25OH"  No results found for: "PREALBUMIN"    Latest Ref Rng & Units 03/06/2022    4:34 AM 03/05/2022    8:21 PM 10/22/2021    8:24 AM  CBC EXTENDED  WBC 4.0 - 10.5 K/uL 11.3  10.6  8.0   RBC 4.22 - 5.81 MIL/uL 4.32  3.80  3.40   Hemoglobin 13.0 - 17.0 g/dL 76.1  60.7  37.1   HCT 39.0 - 52.0 % 38.9  34.2  32.3   Platelets 150 - 400 K/uL 118  94  307      There is no height or weight on file to calculate BMI.  Orders:  No orders of the defined types were placed in this encounter.  No orders of the defined types were placed in this encounter.    Procedures: No procedures performed  Clinical Data: No additional findings.  ROS:  All other systems negative, except as noted in the HPI. Review of Systems  Objective: Vital Signs: There were no vitals taken for this visit.  Specialty Comments:  No specialty comments available.  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   Coronary artery disease involving native coronary artery of native heart with unstable angina pectoris ([place])    Acute congestive heart failure ([place]) 03/27/2020   Diabetic foot ulcers ([place]) 03/27/2020   DM2 (diabetes mellitus, type 2) ([place]) 03/27/2020   HTN (hypertension) 03/27/2020   Past Medical History:  Diagnosis Date   Blind right eye    Carotid artery disease ([place])    04/01/20: 60-79% RICA stenosis, 8-11% LICA stenosis   Chronic kidney disease    ckd  stage 3 a per dr Malva Limes 02-05-2021 epic   Coronary artery disease    NSTEMI s/p CABG in 03/2020 (LIMA-->LAD/diag, L radial-->OM1/OM3); b.   DM type 2    Glaucoma of right eye, unspecified glaucoma type    HFrEF (heart failure with reduced ejection fraction) ([place])    Echo 6/22: EF 13, global HK, mild MR, RVSP 31.9   Hypertension    Ischemic cardiomyopathy    PAD (peripheral artery disease) (HCC)    occluded right axillary artery 03/29/20     Family History  Problem Relation Age of Onset   Diabetes Mother     Past Surgical History:  Procedure Laterality Date   AMPUTATION Right 04/24/2020   Procedure: RIGHT 2ND AND 3RD TOE AMPUTATION;  Surgeon: Newt Minion, MD;  Location: [facility];  Service: Orthopedics;  Laterality: Right;   AMPUTATION Right 10/22/2021   Procedure: RIGHT TRANSMETATARSAL AMPUTATION;  Surgeon: Newt Minion, MD;  Location: [facility];  Service: Orthopedics;  Laterality: Right;   APPLICATION OF WOUND VAC Right 10/22/2021   Procedure: APPLICATION OF WOUND VAC;  Surgeon: Newt Minion, MD;  Location: [facility];  Service: Orthopedics;  Laterality: Right;   CENTRAL VENOUS CATHETER INSERTION Left 04/03/2020   Procedure: INSERTION OF FEMORAL ARTERIAL LINE;  Surgeon: Wonda Olds, MD;  Location: [facility];  Service: Open Heart Surgery;  Laterality: Left;   CORONARY ARTERY BYPASS GRAFT N/A 04/03/2020   Procedure: CORONARY ARTERY BYPASS GRAFTING (CABG) TIMES FOUR USING LEFT INTERNAL MAMMARY ARTERY AND LEFT RADIAL ARTERY.;  Surgeon: Wonda Olds, MD;  Location: [facility];  Service: Open Heart Surgery;  Laterality: N/A;   CORONARY PRESSURE WIRE/FFR WITH 3D MAPPING N/A 03/29/2020   Procedure: Coronary Pressure Wire/FFR w/3D Mapping;  Surgeon: Belva Crome, MD;  Location: [facility];  Service: Cardiovascular;  Laterality: N/A;   EYE SURGERY Right 01/21/2021   right eye cataract lens replacement surgery   IR THORACENTESIS ASP PLEURAL SPACE W/IMG GUIDE  05/03/2020   IR  THORACENTESIS ASP PLEURAL SPACE W/IMG GUIDE  05/16/2020   LEFT HEART CATH AND CORONARY ANGIOGRAPHY N/A 03/29/2020   Procedure: LEFT HEART CATH AND CORONARY ANGIOGRAPHY;  Surgeon: Belva Crome, MD;  Location: [facility];  Service: Cardiovascular;  Laterality: N/A;   PENILE PROSTHESIS IMPLANT N/A 03/03/2021   Procedure: PENILE PROTHESIS INFLATABLE COLOPLAST;  Surgeon: Lucas Mallow, MD;  Location: [facility];  Service: Urology;  Laterality: N/A;  REQUESTING 2 HRS   RADIAL  ARTERY HARVEST Left 04/03/2020   Procedure: LEFT RADIAL ARTERY HARVEST;  Surgeon: Wonda Olds, MD;  Location: [facility];  Service: Open Heart Surgery;  Laterality: Left;   TEE WITHOUT CARDIOVERSION N/A 04/03/2020   Procedure: TRANSESOPHAGEAL ECHOCARDIOGRAM (TEE);  Surgeon: Wonda Olds, MD;  Location: [facility];  Service: Open Heart Surgery;  Laterality: N/A;   UPPER EXTREMITY ANGIOGRAPHY  03/29/2020   Procedure: Upper Extremity Angiography;  Surgeon: Belva Crome, MD;  Location: [facility];  Service: Cardiovascular;;  rt.  arm   Social History   Occupational History   Occupation: retired d/t medical conditions  Tobacco Use   Smoking status: Every Day    Packs/day: 1.00    Years: 0.50    Total pack years: 0.50    Types: Cigarettes    Passive exposure: Current   Smokeless tobacco: Never  Vaping Use   Vaping Use: Never used  Substance and Sexual Activity   Alcohol use: Not Currently    Alcohol/week: 1.0 standard drink of alcohol    Types: 1 Cans of beer per week    Comment: occassionally beer   Drug use: Not Currently    Types: Marijuana    Comment: marijuana last use 3 xweek last used 02-23-2021   Sexual activity: Not Currently

## 2022-03-20 ENCOUNTER — Other Ambulatory Visit: Payer: Self-pay

## 2022-03-20 MED ORDER — SPIRONOLACTONE 25 MG PO TABS: 12.5000 mg | ORAL_TABLET | Freq: Every day | ORAL | 3 refills | Status: DC

## 2022-03-23 ENCOUNTER — Ambulatory Visit: Payer: Medicaid Other | Admitting: Physician Assistant

## 2022-03-30 ENCOUNTER — Other Ambulatory Visit (HOSPITAL_COMMUNITY): Payer: Self-pay

## 2022-03-30 ENCOUNTER — Other Ambulatory Visit: Payer: Self-pay

## 2022-03-30 MED ORDER — LOSARTAN POTASSIUM 25 MG PO TABS
12.5000 mg | ORAL_TABLET | Freq: Every day | ORAL | 3 refills | Status: DC
  Filled 2022-03-30: qty 45, 90d supply, fill #0
  Filled 2022-06-24: qty 45, 90d supply, fill #1
  Filled 2022-09-23 (×2): qty 45, 90d supply, fill #2
  Filled 2022-12-22: qty 45, 90d supply, fill #3

## 2022-03-31 ENCOUNTER — Other Ambulatory Visit (HOSPITAL_COMMUNITY): Payer: Self-pay

## 2022-04-03 ENCOUNTER — Other Ambulatory Visit: Payer: Self-pay | Admitting: Family

## 2022-04-03 ENCOUNTER — Other Ambulatory Visit (HOSPITAL_COMMUNITY): Payer: Self-pay

## 2022-04-03 DIAGNOSIS — I5020 Unspecified systolic (congestive) heart failure: Secondary | ICD-10-CM

## 2022-04-03 DIAGNOSIS — I2510 Atherosclerotic heart disease of native coronary artery without angina pectoris: Secondary | ICD-10-CM

## 2022-04-03 DIAGNOSIS — I5022 Chronic systolic (congestive) heart failure: Secondary | ICD-10-CM

## 2022-04-03 DIAGNOSIS — I10 Essential (primary) hypertension: Secondary | ICD-10-CM

## 2022-04-03 DIAGNOSIS — E1100 Type 2 diabetes mellitus with hyperosmolarity without nonketotic hyperglycemic-hyperosmolar coma (NKHHC): Secondary | ICD-10-CM

## 2022-04-03 DIAGNOSIS — N1831 Chronic kidney disease, stage 3a: Secondary | ICD-10-CM

## 2022-04-03 DIAGNOSIS — I779 Disorder of arteries and arterioles, unspecified: Secondary | ICD-10-CM

## 2022-04-03 DIAGNOSIS — Z720 Tobacco use: Secondary | ICD-10-CM

## 2022-04-06 ENCOUNTER — Other Ambulatory Visit (HOSPITAL_COMMUNITY): Payer: Self-pay

## 2022-04-06 DIAGNOSIS — N2581 Secondary hyperparathyroidism of renal origin: Secondary | ICD-10-CM | POA: Diagnosis not present

## 2022-04-06 DIAGNOSIS — I509 Heart failure, unspecified: Secondary | ICD-10-CM | POA: Diagnosis not present

## 2022-04-06 DIAGNOSIS — D631 Anemia in chronic kidney disease: Secondary | ICD-10-CM | POA: Diagnosis not present

## 2022-04-06 DIAGNOSIS — I129 Hypertensive chronic kidney disease with stage 1 through stage 4 chronic kidney disease, or unspecified chronic kidney disease: Secondary | ICD-10-CM | POA: Diagnosis not present

## 2022-04-06 DIAGNOSIS — N1831 Chronic kidney disease, stage 3a: Secondary | ICD-10-CM | POA: Diagnosis not present

## 2022-04-06 DIAGNOSIS — R809 Proteinuria, unspecified: Secondary | ICD-10-CM | POA: Diagnosis not present

## 2022-04-07 ENCOUNTER — Other Ambulatory Visit: Payer: Self-pay | Admitting: Family

## 2022-04-07 ENCOUNTER — Other Ambulatory Visit (HOSPITAL_COMMUNITY): Payer: Self-pay

## 2022-04-07 ENCOUNTER — Other Ambulatory Visit: Payer: Self-pay

## 2022-04-07 DIAGNOSIS — E1100 Type 2 diabetes mellitus with hyperosmolarity without nonketotic hyperglycemic-hyperosmolar coma (NKHHC): Secondary | ICD-10-CM

## 2022-04-07 DIAGNOSIS — I2510 Atherosclerotic heart disease of native coronary artery without angina pectoris: Secondary | ICD-10-CM

## 2022-04-07 DIAGNOSIS — N1831 Chronic kidney disease, stage 3a: Secondary | ICD-10-CM

## 2022-04-07 DIAGNOSIS — I5020 Unspecified systolic (congestive) heart failure: Secondary | ICD-10-CM

## 2022-04-07 DIAGNOSIS — I10 Essential (primary) hypertension: Secondary | ICD-10-CM

## 2022-04-07 DIAGNOSIS — I779 Disorder of arteries and arterioles, unspecified: Secondary | ICD-10-CM

## 2022-04-07 DIAGNOSIS — I5022 Chronic systolic (congestive) heart failure: Secondary | ICD-10-CM

## 2022-04-07 DIAGNOSIS — Z720 Tobacco use: Secondary | ICD-10-CM

## 2022-04-08 ENCOUNTER — Other Ambulatory Visit (HOSPITAL_COMMUNITY): Payer: Self-pay

## 2022-04-08 ENCOUNTER — Other Ambulatory Visit: Payer: Self-pay | Admitting: Family

## 2022-04-08 ENCOUNTER — Other Ambulatory Visit: Payer: Self-pay

## 2022-04-08 DIAGNOSIS — I779 Disorder of arteries and arterioles, unspecified: Secondary | ICD-10-CM

## 2022-04-08 DIAGNOSIS — Z720 Tobacco use: Secondary | ICD-10-CM

## 2022-04-08 DIAGNOSIS — N1831 Chronic kidney disease, stage 3a: Secondary | ICD-10-CM

## 2022-04-08 DIAGNOSIS — I10 Essential (primary) hypertension: Secondary | ICD-10-CM

## 2022-04-08 DIAGNOSIS — E1100 Type 2 diabetes mellitus with hyperosmolarity without nonketotic hyperglycemic-hyperosmolar coma (NKHHC): Secondary | ICD-10-CM

## 2022-04-08 DIAGNOSIS — I5022 Chronic systolic (congestive) heart failure: Secondary | ICD-10-CM

## 2022-04-08 DIAGNOSIS — I2510 Atherosclerotic heart disease of native coronary artery without angina pectoris: Secondary | ICD-10-CM

## 2022-04-08 DIAGNOSIS — I5020 Unspecified systolic (congestive) heart failure: Secondary | ICD-10-CM

## 2022-04-08 MED ORDER — EMPAGLIFLOZIN 25 MG PO TABS
25.0000 mg | ORAL_TABLET | Freq: Every day | ORAL | 3 refills | Status: DC
  Filled 2022-04-08: qty 90, 90d supply, fill #0
  Filled 2022-07-06: qty 90, 90d supply, fill #1

## 2022-04-08 NOTE — Telephone Encounter (Signed)
Requested medication (s) are due for refill today: yes  Requested medication (s) are on the active medication list: yes  Last refill:  03/06/22  Future visit scheduled:yes  Notes to clinic:  Unable to refill per protocol, last refill by another provider.      Requested Prescriptions  Pending Prescriptions Disp Refills   empagliflozin (JARDIANCE) 25 MG TABS tablet 90 tablet 2    Sig: Take 1 tablet (25 mg total) by mouth daily before breakfast.     Endocrinology:  Diabetes - SGLT2 Inhibitors Failed - 04/08/2022  9:27 AM      Failed - Cr in normal range and within 360 days    Creatinine, Ser  Date Value Ref Range Status  03/05/2022 1.50 (H) 0.61 - 1.24 mg/dL Final         Failed - eGFR in normal range and within 360 days    GFR, Estimated  Date Value Ref Range Status  03/05/2022 51 (L) >60 mL/min Final    Comment:    (NOTE) Calculated using the CKD-EPI Creatinine Equation (2021)    eGFR  Date Value Ref Range Status  11/28/2021 45 (L) >59 mL/min/1.73 Final         Passed - HBA1C is between 0 and 7.9 and within 180 days    Hemoglobin A1C  Date Value Ref Range Status  11/28/2021 6.9 (A) 4.0 - 5.6 % Final   Hgb A1c MFr Bld  Date Value Ref Range Status  08/02/2020 8.1 (H) 4.8 - 5.6 % Final    Comment:             Prediabetes: 5.7 - 6.4          Diabetes: >6.4          Glycemic control for adults with diabetes: <7.0          Passed - Valid encounter within last 6 months    Recent Outpatient Visits           4 months ago Type 2 diabetes mellitus with hyperosmolarity without coma, without long-term current use of insulin ([place])   [facility], Amy J, NP   8 months ago Screening for STD (sexually transmitted disease)   [facility], Amy J, NP   1 year ago Type 2 diabetes mellitus with hyperosmolarity without coma, without long-term current use of insulin [facility])   [place], [place] L, RPH-CPP   1 year ago Type 2 diabetes mellitus with hyperosmolarity without coma, without long-term current use of insulin [facility])   [place], [place] L, RPH-CPP   1 year ago Type 2 diabetes mellitus with hyperosmolarity without coma, without long-term current use of insulin [facility])   [place], Jarome Matin, RPH-CPP       Future Appointments             In 3 months [place], Greer Ee, MD [place] at [facility] [place]   In 3 months [place], Melanie Crazier, MD [facility] Endocrinology

## 2022-04-08 NOTE — Telephone Encounter (Signed)
Requested medication (s) are due for refill today: Yes  Requested medication (s) are on the active medication list: Yes  Last refill:  03/06/22  Future visit scheduled: Yes  Notes to clinic:  Unable to refill per protocol, last refill by another provider. Resend to another pharmacy, patient never picked up on 03/06/22.     Requested Prescriptions  Pending Prescriptions Disp Refills   empagliflozin (JARDIANCE) 25 MG TABS tablet 90 tablet 2    Sig: Take 1 tablet (25 mg total) by mouth daily before breakfast.     Endocrinology:  Diabetes - SGLT2 Inhibitors Failed - 04/08/2022  9:27 AM      Failed - Cr in normal range and within 360 days    Creatinine, Ser  Date Value Ref Range Status  03/05/2022 1.50 (H) 0.61 - 1.24 mg/dL Final         Failed - eGFR in normal range and within 360 days    GFR, Estimated  Date Value Ref Range Status  03/05/2022 51 (L) >60 mL/min Final    Comment:    (NOTE) Calculated using the CKD-EPI Creatinine Equation (2021)    eGFR  Date Value Ref Range Status  11/28/2021 45 (L) >59 mL/min/1.73 Final         Passed - HBA1C is between 0 and 7.9 and within 180 days    Hemoglobin A1C  Date Value Ref Range Status  11/28/2021 6.9 (A) 4.0 - 5.6 % Final   Hgb A1c MFr Bld  Date Value Ref Range Status  08/02/2020 8.1 (H) 4.8 - 5.6 % Final    Comment:             Prediabetes: 5.7 - 6.4          Diabetes: >6.4          Glycemic control for adults with diabetes: <7.0          Passed - Valid encounter within last 6 months    Recent Outpatient Visits           4 months ago Type 2 diabetes mellitus with hyperosmolarity without coma, without long-term current use of insulin ([place])   [facility], Amy J, NP   8 months ago Screening for STD (sexually transmitted disease)   [place] [facility], Amy J, NP   1 year ago Type 2 diabetes mellitus with hyperosmolarity without coma, without  long-term current use of insulin [facility])   [place], Eleva L, RPH-CPP   1 year ago Type 2 diabetes mellitus with hyperosmolarity without coma, without long-term current use of insulin [facility])   [place], [place] L, RPH-CPP   1 year ago Type 2 diabetes mellitus with hyperosmolarity without coma, without long-term current use of insulin [facility])   [place], Jarome Matin, RPH-CPP       Future Appointments             In 3 months [place], Greer Ee, MD [place] at [facility] [place]   In 3 months [place], Melanie Crazier, MD [facility] Endocrinology

## 2022-04-08 NOTE — Telephone Encounter (Signed)
Pt's medication was sent to pt's pharmacy as requested. Confirmation received.  °

## 2022-04-08 NOTE — Telephone Encounter (Signed)
Pt called about refill request for empagliflozin (JARDIANCE) 25 MG TABS tablet [id number]  He stated that he is completely out / the last request was denied due to being responded to by other means /  pts had Jardiance RX sent somewhere on 1.16.24/ but not sure where / please advise or refill and send to  [facility]

## 2022-04-13 ENCOUNTER — Ambulatory Visit (INDEPENDENT_AMBULATORY_CARE_PROVIDER_SITE_OTHER): Payer: 59 | Admitting: Orthopedic Surgery

## 2022-04-13 DIAGNOSIS — L97521 Non-pressure chronic ulcer of other part of left foot limited to breakdown of skin: Secondary | ICD-10-CM | POA: Diagnosis not present

## 2022-04-13 DIAGNOSIS — B351 Tinea unguium: Secondary | ICD-10-CM

## 2022-04-13 DIAGNOSIS — Z89431 Acquired absence of right foot: Secondary | ICD-10-CM | POA: Diagnosis not present

## 2022-04-14 ENCOUNTER — Encounter: Payer: Self-pay | Admitting: Orthopedic Surgery

## 2022-04-14 NOTE — Progress Notes (Signed)
Office Visit Note   Patient: Curtis Watkins           Date of Birth: [birth year]           MRN: [id number] Visit Date: 04/13/2022              Requested by: Camillia Herter, NP [address] [place],  [place] [address] PCP: Camillia Herter, NP  Chief Complaint  Patient presents with   Right Foot - Follow-up    Hx transmet amputation    Left Foot - Follow-up      HPI: Patient is a 67 year old gentleman who presents in follow-up for right transmetatarsal amputation and left foot ulceration.  Assessment & Plan: Visit Diagnoses:  1. Ulcer of left foot, limited to breakdown of skin ([place])   2. History of transmetatarsal amputation of right foot ([place])   3. Onychomycosis     Plan: Recommended a soft cushioned sneaker.  At follow-up we will evaluate for a postoperative shoe and felt relieving donut depending on shoe wear.  Follow-Up Instructions: Return in about 4 weeks (around 05/11/2022).   Ortho Exam  Patient is alert, oriented, no adenopathy, well-dressed, normal affect, normal respiratory effort. Examination patient has a well-healed right transmetatarsal amputation no ulcers no callus.  Examination of the left foot he is currently wearing hard leather shoes he has an ulcer beneath the first metatarsal head as well as callus between the third and fourth metatarsal heads.  There has been significant increased callus on the left foot secondary to increased weightbearing pressure.  After informed consent a 10 blade knife was used to debride the skin and soft tissue from the first metatarsal head ulcer ulcer is 3 cm in diameter and 2 mm deep after debridement.  The callus was pared from the third and fourth metatarsal head callus and this left the wound that was 2 cm in diameter with healthy tissue.  Imaging: No results found. No images are attached to the encounter.  Labs: Lab Results  Component Value Date   HGBA1C 6.9 (A) 11/28/2021   HGBA1C 6.7 (A) 02/07/2021    HGBA1C 6.7 (A) 11/01/2020     Lab Results  Component Value Date   ALBUMIN 4.0 08/02/2020    Lab Results  Component Value Date   MG 2.6 (H) 04/04/2020   MG 2.6 (H) 04/04/2020   MG 2.9 (H) 04/03/2020   No results found for: "VD25OH"  No results found for: "PREALBUMIN"    Latest Ref Rng & Units 03/06/2022    4:34 AM 03/05/2022    8:21 PM 10/22/2021    8:24 AM  CBC EXTENDED  WBC 4.0 - 10.5 K/uL 11.3  10.6  8.0   RBC 4.22 - 5.81 MIL/uL 4.32  3.80  3.40   Hemoglobin 13.0 - 17.0 g/dL 13.4  11.8  10.6   HCT 39.0 - 52.0 % 38.9  34.2  32.3   Platelets 150 - 400 K/uL 118  94  307      There is no height or weight on file to calculate BMI.  Orders:  No orders of the defined types were placed in this encounter.  No orders of the defined types were placed in this encounter.    Procedures: No procedures performed  Clinical Data: No additional findings.  ROS:  All other systems negative, except as noted in the HPI. Review of Systems  Objective: Vital Signs: There were no vitals taken for this visit.  Specialty Comments:  No specialty comments available.  PMFS History: Patient Active Problem List   Diagnosis Date Noted   Influenza 03/06/2022   Elevated troponin 03/06/2022   Generalized weakness 03/05/2022   Trichomonas infection 07/31/2021   Erectile dysfunction 03/03/2021   HFrEF (heart failure with reduced ejection fraction) ([place]) 08/21/2020   Acute on chronic systolic and diastolic heart failure, NYHA class 3 ([place]) 05/25/2020   Pleural effusion    Gangrene of toe of right foot (HCC)    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   Coronary artery disease involving native coronary artery of native heart with unstable angina pectoris ([place])    Acute congestive heart failure ([place]) 03/27/2020   Diabetic foot ulcers ([place]) 03/27/2020   DM2 (diabetes mellitus, type 2) ([place]) 03/27/2020   HTN (hypertension) 03/27/2020   Past Medical History:  Diagnosis Date   Blind right eye     Carotid artery disease ([place])    04/01/20: 60-79% RICA stenosis, 123456 LICA stenosis   Chronic kidney disease    ckd stage 3 a per dr Malva Limes 02-05-2021 epic   Coronary artery disease    NSTEMI s/p CABG in 03/2020 (LIMA-->LAD/diag, L radial-->OM1/OM3); b.   DM type 2    Glaucoma of right eye, unspecified glaucoma type    HFrEF (heart failure with reduced ejection fraction) ([place])    Echo 6/22: EF 35, global HK, mild MR, RVSP 31.9   Hypertension    Ischemic cardiomyopathy    PAD (peripheral artery disease) (HCC)    occluded right axillary artery 03/29/20     Family History  Problem Relation Age of Onset   Diabetes Mother     Past Surgical History:  Procedure Laterality Date   AMPUTATION Right 04/24/2020   Procedure: RIGHT 2ND AND 3RD TOE AMPUTATION;  Surgeon: Newt Minion, MD;  Location: [place];  Service: Orthopedics;  Laterality: Right;   AMPUTATION Right 10/22/2021   Procedure: RIGHT TRANSMETATARSAL AMPUTATION;  Surgeon: Newt Minion, MD;  Location: [place];  Service: Orthopedics;  Laterality: Right;   APPLICATION OF WOUND VAC Right 10/22/2021   Procedure: APPLICATION OF WOUND VAC;  Surgeon: Newt Minion, MD;  Location: [facility];  Service: Orthopedics;  Laterality: Right;   CENTRAL VENOUS CATHETER INSERTION Left 04/03/2020   Procedure: INSERTION OF FEMORAL ARTERIAL LINE;  Surgeon: Wonda Olds, MD;  Location: [place];  Service: Open Heart Surgery;  Laterality: Left;   CORONARY ARTERY BYPASS GRAFT N/A 04/03/2020   Procedure: CORONARY ARTERY BYPASS GRAFTING (CABG) TIMES FOUR USING LEFT INTERNAL MAMMARY ARTERY AND LEFT RADIAL ARTERY.;  Surgeon: Wonda Olds, MD;  Location: [facility];  Service: Open Heart Surgery;  Laterality: N/A;   CORONARY PRESSURE WIRE/FFR WITH 3D MAPPING N/A 03/29/2020   Procedure: Coronary Pressure Wire/FFR w/3D Mapping;  Surgeon: Belva Crome, MD;  Location: [place] [facility];  Service: Cardiovascular;  Laterality: N/A;   EYE SURGERY Right 01/21/2021   right  eye cataract lens replacement surgery   IR THORACENTESIS ASP PLEURAL SPACE W/IMG GUIDE  05/03/2020   IR THORACENTESIS ASP PLEURAL SPACE W/IMG GUIDE  05/16/2020   LEFT HEART CATH AND CORONARY ANGIOGRAPHY N/A 03/29/2020   Procedure: LEFT HEART CATH AND CORONARY ANGIOGRAPHY;  Surgeon: Belva Crome, MD;  Location: [facility] [facility];  Service: Cardiovascular;  Laterality: N/A;   PENILE PROSTHESIS IMPLANT N/A 03/03/2021   Procedure: PENILE PROTHESIS INFLATABLE COLOPLAST;  Surgeon: Lucas Mallow, MD;  Location: [facility];  Service: Urology;  Laterality:  N/A;  REQUESTING 2 HRS   RADIAL ARTERY HARVEST Left 04/03/2020   Procedure: LEFT RADIAL ARTERY HARVEST;  Surgeon: Wonda Olds, MD;  Location: [facility];  Service: Open Heart Surgery;  Laterality: Left;   TEE WITHOUT CARDIOVERSION N/A 04/03/2020   Procedure: TRANSESOPHAGEAL ECHOCARDIOGRAM (TEE);  Surgeon: Wonda Olds, MD;  Location: [place];  Service: Open Heart Surgery;  Laterality: N/A;   UPPER EXTREMITY ANGIOGRAPHY  03/29/2020   Procedure: Upper Extremity Angiography;  Surgeon: Belva Crome, MD;  Location: [place] [facility];  Service: Cardiovascular;;  rt.  arm   Social History   Occupational History   Occupation: retired d/t medical conditions  Tobacco Use   Smoking status: Every Day    Packs/day: 1.00    Years: 0.50    Total pack years: 0.50    Types: Cigarettes    Passive exposure: Current   Smokeless tobacco: Never  Vaping Use   Vaping Use: Never used  Substance and Sexual Activity   Alcohol use: Not Currently    Alcohol/week: 1.0 standard drink of alcohol    Types: 1 Cans of beer per week    Comment: occassionally beer   Drug use: Not Currently    Types: Marijuana    Comment: marijuana last use 3 xweek last used 02-23-2021   Sexual activity: Not Currently

## 2022-04-15 ENCOUNTER — Other Ambulatory Visit (HOSPITAL_COMMUNITY): Payer: Self-pay

## 2022-04-16 ENCOUNTER — Other Ambulatory Visit (HOSPITAL_COMMUNITY): Payer: Self-pay

## 2022-04-20 ENCOUNTER — Ambulatory Visit (INDEPENDENT_AMBULATORY_CARE_PROVIDER_SITE_OTHER): Payer: 59

## 2022-04-20 DIAGNOSIS — Z1211 Encounter for screening for malignant neoplasm of colon: Secondary | ICD-10-CM | POA: Diagnosis not present

## 2022-04-20 DIAGNOSIS — Z122 Encounter for screening for malignant neoplasm of respiratory organs: Secondary | ICD-10-CM | POA: Diagnosis not present

## 2022-04-20 DIAGNOSIS — Z0000 Encounter for general adult medical examination without abnormal findings: Secondary | ICD-10-CM | POA: Diagnosis not present

## 2022-04-20 NOTE — Progress Notes (Signed)
.I connected with  Curtis Watkins on 04/20/22 by a audio enabled telemedicine application and verified that I am speaking with the correct person using two identifiers.  Patient Location: Home  Provider Location: Office/Clinic  I discussed the limitations of evaluation and management by telemedicine. The patient expressed understanding and agreed to proceed.     Subjective:   Curtis Watkins is a 67 y.o. male who presents for Medicare Annual/Subsequent preventive examination.  Review of Systems    Defer to PCP  Cardiac Risk Factors include: advanced age (>46mn, >>43women);diabetes mellitus;hypertension;male gender;smoking/ tobacco exposure     Objective:    Today's Vitals   04/20/22 1020  PainSc: 0-No pain   There is no height or weight on file to calculate BMI.     04/20/2022   10:26 AM 03/05/2022    6:44 PM 10/22/2021    8:04 AM 03/03/2021    8:40 AM 05/25/2020    3:00 PM 05/25/2020    6:33 AM 04/24/2020    8:18 AM  Advanced Directives  Does Patient Have a Medical Advance Directive? No No No No No No No  Would patient like information on creating a medical advance directive? Yes (Inpatient - patient defers creating a medical advance directive at this time - Information given)  No - Patient declined No - Patient declined No - Patient declined  No - Patient declined    Current Medications (verified) Outpatient Encounter Medications as of 04/20/2022  Medication Sig   aspirin EC 81 MG tablet Take 81 mg by mouth daily.   atorvastatin (LIPITOR) 80 MG tablet Take 1 tablet (80 mg total) by mouth daily.   blood glucose meter kit and supplies by Other route as directed. Dispense based on patient and insurance preference. Use up to 2x times daily as directed. (FOR ICD-10 E10.9, E11.9).   brimonidine (ALPHAGAN) 0.2 % ophthalmic solution Place 1 drop into the left eye 2 (two) times daily.   dorzolamide-timolol (COSOPT) 22.3-6.8 MG/ML ophthalmic solution Place 1 drop into the left eye 2 (two) times  daily.   empagliflozin (JARDIANCE) 25 MG TABS tablet Take 1 tablet (25 mg total) by mouth daily before breakfast.   furosemide (LASIX) 20 MG tablet Take 1 tablet (20 mg total) by mouth 3 (three) times a week. Take on Mondays, Wednesdays, and Fridays.   losartan (COZAAR) 25 MG tablet Take 1/2 tablet (12.5 mg total) by mouth daily.   metoprolol succinate (TOPROL XL) 25 MG 24 hr tablet Take 1.5 tablets (37.5 mg total) by mouth daily.   spironolactone (ALDACTONE) 25 MG tablet Take 0.5 tablets (12.5 mg total) by mouth daily.   [DISCONTINUED] aspirin EC 325 MG tablet Take 162.5 mg by mouth daily.   [DISCONTINUED] HYDROcodone-acetaminophen (NORCO/VICODIN) 5-325 MG tablet Take 1 tablet by mouth every 4 (four) hours as needed.   [DISCONTINUED] oseltamivir (TAMIFLU) 30 MG capsule Take 1 capsule (30 mg total) by mouth 2 (two) times daily.   No facility-administered encounter medications on file as of 04/20/2022.    Allergies (verified) Patient has no known allergies.   History: Past Medical History:  Diagnosis Date   Blind right eye    Carotid artery disease ([place]    04/01/20: 60-79% RICA stenosis, [id number] stenosis   Chronic kidney disease    ckd stage 3 a per dr pMalva Limes12-14-2022 epic   Coronary artery disease    NSTEMI s/p CABG in 03/2020 (LIMA-->LAD/diag, L radial-->OM1/OM3); b.   DM type 2  Glaucoma of right eye, unspecified glaucoma type    HFrEF (heart failure with reduced ejection fraction) ([place])    Echo 6/22: EF 45, global HK, mild MR, RVSP 31.9   Hypertension    Ischemic cardiomyopathy    PAD (peripheral artery disease) ([place])    occluded right axillary artery 03/29/20    Past Surgical History:  Procedure Laterality Date   AMPUTATION Right 04/24/2020   Procedure: RIGHT 2ND AND 3RD TOE AMPUTATION;  Surgeon: Newt Minion, MD;  Location: [place];  Service: Orthopedics;  Laterality: Right;   AMPUTATION Right 10/22/2021   Procedure: RIGHT TRANSMETATARSAL AMPUTATION;  Surgeon:  Newt Minion, MD;  Location: [place];  Service: Orthopedics;  Laterality: Right;   APPLICATION OF WOUND VAC Right 10/22/2021   Procedure: APPLICATION OF WOUND VAC;  Surgeon: Newt Minion, MD;  Location: [place];  Service: Orthopedics;  Laterality: Right;   CENTRAL VENOUS CATHETER INSERTION Left 04/03/2020   Procedure: INSERTION OF FEMORAL ARTERIAL LINE;  Surgeon: Wonda Olds, MD;  Location: [place];  Service: Open Heart Surgery;  Laterality: Left;   CORONARY ARTERY BYPASS GRAFT N/A 04/03/2020   Procedure: CORONARY ARTERY BYPASS GRAFTING (CABG) TIMES FOUR USING LEFT INTERNAL MAMMARY ARTERY AND LEFT RADIAL ARTERY.;  Surgeon: Wonda Olds, MD;  Location: [place];  Service: Open Heart Surgery;  Laterality: N/A;   CORONARY PRESSURE WIRE/FFR WITH 3D MAPPING N/A 03/29/2020   Procedure: Coronary Pressure Wire/FFR w/3D Mapping;  Surgeon: Belva Crome, MD;  Location: [facility];  Service: Cardiovascular;  Laterality: N/A;   EYE SURGERY Right 01/21/2021   right eye cataract lens replacement surgery   IR THORACENTESIS ASP PLEURAL SPACE W/IMG GUIDE  05/03/2020   IR THORACENTESIS ASP PLEURAL SPACE W/IMG GUIDE  05/16/2020   LEFT HEART CATH AND CORONARY ANGIOGRAPHY N/A 03/29/2020   Procedure: LEFT HEART CATH AND CORONARY ANGIOGRAPHY;  Surgeon: Belva Crome, MD;  Location: [facility];  Service: Cardiovascular;  Laterality: N/A;   PENILE PROSTHESIS IMPLANT N/A 03/03/2021   Procedure: PENILE PROTHESIS INFLATABLE COLOPLAST;  Surgeon: Lucas Mallow, MD;  Location: [place];  Service: Urology;  Laterality: N/A;  REQUESTING 2 HRS   RADIAL ARTERY HARVEST Left 04/03/2020   Procedure: LEFT RADIAL ARTERY HARVEST;  Surgeon: Wonda Olds, MD;  Location: [place];  Service: Open Heart Surgery;  Laterality: Left;   TEE WITHOUT CARDIOVERSION N/A 04/03/2020   Procedure: TRANSESOPHAGEAL ECHOCARDIOGRAM (TEE);  Surgeon: Wonda Olds, MD;  Location: [place];  Service: Open Heart  Surgery;  Laterality: N/A;   UPPER EXTREMITY ANGIOGRAPHY  03/29/2020   Procedure: Upper Extremity Angiography;  Surgeon: Belva Crome, MD;  Location: [facility] [facility];  Service: Cardiovascular;;  rt.  arm   Family History  Problem Relation Age of Onset   Diabetes Mother    Social History   Socioeconomic History   Marital status: Significant Other    Spouse name: Not on file   Number of children: 3   Years of education: Not on file   Highest education level: High school graduate  Occupational History   Occupation: retired d/t medical conditions  Tobacco Use   Smoking status: Every Day    Packs/day: 1.00    Years: 0.50    Total pack years: 0.50    Types: Cigarettes    Passive exposure: Current   Smokeless tobacco: Never  Vaping Use   Vaping Use: Never used  Substance and Sexual Activity  Alcohol use: Not Currently    Alcohol/week: 1.0 standard drink of alcohol    Types: 1 Cans of beer per week    Comment: occassionally beer   Drug use: Not Currently    Types: Marijuana    Comment: marijuana last use 3 xweek last used 02-23-2021   Sexual activity: Not Currently  Other Topics Concern   Not on file  Social History Narrative   Not on file   Social Determinants of Health   Financial Resource Strain: High Risk (05/28/2020)   Overall Financial Resource Strain (CARDIA)    Difficulty of Paying Living Expenses: Hard  Food Insecurity: No Food Insecurity (04/20/2022)   Hunger Vital Sign    Worried About Running Out of Food in the Last Year: Never true    Ran Out of Food in the Last Year: Never true  Transportation Needs: No Transportation Needs (04/20/2022)   PRAPARE - Hydrologist (Medical): No    Lack of Transportation (Non-Medical): No  Physical Activity: Inactive (05/28/2020)   Exercise Vital Sign    Days of Exercise per Week: 0 days    Minutes of Exercise per Session: 0 min  Stress: Not on file  Social Connections: Not on file    Tobacco  Counseling Ready to quit: Not Answered Counseling given: Not Answered   Clinical Intake:  Pre-visit preparation completed: Yes  Pain : 0-10 Pain Score: 0-No pain     Diabetes: Yes CBG done?: No Did pt. bring in CBG monitor from home?: No  How often do you need to have someone help you when you read instructions, pamphlets, or other written materials from your doctor or pharmacy?: 1 - Never  Diabetic- Yes   Interpreter Needed?: No      Activities of Daily Living    04/20/2022   10:27 AM 10/22/2021    8:06 AM  In your present state of health, do you have any difficulty performing the following activities:  Hearing? 0 0  Vision? 0 0  Difficulty concentrating or making decisions? 0 0  Walking or climbing stairs? 0 0  Dressing or bathing? 0 0  Doing errands, shopping? 0   Preparing Food and eating ? N   Using the Toilet? N   In the past six months, have you accidently leaked urine? N   Do you have problems with loss of bowel control? N   Managing your Medications? N   Managing your Finances? N   Housekeeping or managing your Housekeeping? N     Patient Care Team: Camillia Herter, NP as PCP - General (Nurse Practitioner) Freada Bergeron, MD as PCP - Cardiology (Cardiology) Sharmon Revere as Physician Assistant (Cardiology)  Indicate any recent Medical Services you may have received from other than [facility] providers in the past year (date may be approximate).     Assessment:   This is a routine wellness examination for Derrick.  Hearing/Vision screen No results found.  Dietary issues and exercise activities discussed: Current Exercise Habits: The patient does not participate in regular exercise at present, Exercise limited by: orthopedic condition(s)   Goals Addressed             This Visit's Progress    Quit Smoking        Depression Screen    04/20/2022   10:27 AM 11/28/2021    2:22 PM 08/02/2020    8:58 AM 07/05/2020   11:51 AM  PHQ 2/9  Scores  PHQ - 2 Score 0 0 0 0  PHQ- 9 Score   0 0    Fall Risk    04/20/2022   10:27 AM 07/30/2021    2:28 PM 07/05/2020   11:51 AM  Fall Risk   Falls in the past year? 0 0 0  Number falls in past yr: 0 0 0  Injury with Fall? 0 0 0  Risk for fall due to : No Fall Risks No Fall Risks No Fall Risks  Follow up Falls evaluation completed Falls evaluation completed Falls evaluation completed    [place]:  Any stairs in or around the home? No  If so, are there any without handrails? No  Home free of loose throw rugs in walkways, pet beds, electrical cords, etc? Yes  Adequate lighting in your home to reduce risk of falls? Yes   ASSISTIVE DEVICES UTILIZED TO PREVENT FALLS:  Life alert? No  Use of a cane, walker or w/c?  Sometimes patient uses cane to help w/balance  Grab bars in the bathroom? No  Shower chair or bench in shower? No  Elevated toilet seat or a handicapped toilet? No   TIMED UP AND GO:  Was the test performed? No .  Length of time to ambulate 10 feet: N/A sec.     Cognitive Function:        04/20/2022   10:28 AM  6CIT Screen  What Year? 0 points  What month? 0 points  What time? 0 points  Count back from 20 0 points  Months in reverse 0 points  Repeat phrase 0 points  Total Score 0 points    Immunizations Immunization History  Administered Date(s) Administered   PFIZER(Purple Top)SARS-COV-2 Vaccination 05/10/2019, 06/05/2019, 02/01/2020   Tdap 10/13/2018    TDAP status: Up to date  Flu Vaccine status: Due, Education has been provided regarding the importance of this vaccine. Advised may receive this vaccine at local pharmacy or Health Dept. Aware to provide a copy of the vaccination record if obtained from local pharmacy or Health Dept. Verbalized acceptance and understanding.  Pneumococcal vaccine status: Due, Education has been provided regarding the importance of this vaccine. Advised may receive this vaccine at  local pharmacy or Health Dept. Aware to provide a copy of the vaccination record if obtained from local pharmacy or Health Dept. Verbalized acceptance and understanding.  Covid-19 vaccine status: Completed vaccines  Qualifies for Shingles Vaccine? Yes   Zostavax completed No   Shingrix Completed?: No.    Education has been provided regarding the importance of this vaccine. Patient has been advised to call insurance company to determine out of pocket expense if they have not yet received this vaccine. Advised may also receive vaccine at local pharmacy or Health Dept. Verbalized acceptance and understanding.  Screening Tests Health Maintenance  Topic Date Due   COLONOSCOPY (Pts 45-7yr Insurance coverage will need to be confirmed)  Never done   Zoster Vaccines- Shingrix (1 of 2) Never done   COVID-19 Vaccine (4 - 2023-24 season) 10/24/2021   FOOT EXAM  11/01/2021   INFLUENZA VACCINE  05/24/2022 (Originally 09/23/2021)   Pneumonia Vaccine 67 Years old (1 of 2 - PCV) 12/11/2022 (Originally 12/11/1961)   HEMOGLOBIN A1C  05/30/2022   Diabetic kidney evaluation - Urine ACR  11/29/2022   OPHTHALMOLOGY EXAM  01/28/2023   Diabetic kidney evaluation - eGFR measurement  03/06/2023   Medicare Annual Wellness (AWV)  04/21/2023   DTaP/Tdap/Td (2 -  Td or Tdap) 10/12/2028   Hepatitis C Screening  Completed   HPV VACCINES  Aged Out    Health Maintenance  Health Maintenance Due  Topic Date Due   COLONOSCOPY (Pts 45-41yr Insurance coverage will need to be confirmed)  Never done   Zoster Vaccines- Shingrix (1 of 2) Never done   COVID-19 Vaccine (4 - 2023-24 season) 10/24/2021   FOOT EXAM  11/01/2021    Colorectal cancer screening: Referral to GI placed 04/20/22. Pt aware the office will call re: appt.  Lung Cancer Screening: (Low Dose CT Chest recommended if Age 67-80years, 30 pack-year currently smoking OR have quit w/in 15years.) does qualify.   Lung Cancer Screening Referral: ordered  04/20/22  Additional Screening:  Hepatitis C Screening: does qualify; Completed 08/02/2020  Vision Screening: Recommended annual ophthalmology exams for early detection of glaucoma and other disorders of the eye. Is the patient up to date with their annual eye exam?  Yes  Who is the provider or what is the name of the office in which the patient attends annual eye exams? [facility] If pt is not established with a provider, would they like to be referred to a provider to establish care?  N/A .   Dental Screening: Recommended annual dental exams for proper oral hygiene  Community Resource Referral / Chronic Care Management: CRR required this visit?  No   CCM required this visit?  No      Plan:     I have personally reviewed and noted the following in the patient's chart:   Medical and social history Use of alcohol, tobacco or illicit drugs  Current medications and supplements including opioid prescriptions. Patient is not currently taking opioid prescriptions. Functional ability and status Nutritional status Physical activity Advanced directives List of other physicians Hospitalizations, surgeries, and ER visits in previous 12 months Vitals Screenings to include cognitive, depression, and falls Referrals and appointments  In addition, I have reviewed and discussed with patient certain preventive protocols, quality metrics, and best practice recommendations. A written personalized care plan for preventive services as well as general preventive health recommendations were provided to patient.     [facility]lmon Else [facility]  04/20/2022   Nurse Notes: Non-Face to face 40 minute visit . Mr. BSigner, Thank you for taking time to come for your Medicare Wellness Visit. I appreciate your ongoing commitment to your health goals. Please review the following plan we discussed and let me know if I can assist you in the future.   These are the goals we discussed:  Goals       Quit Smoking        This is a list of the screening recommended for you and due dates:  Health Maintenance  Topic Date Due   Colon Cancer Screening  Never done   Zoster (Shingles) Vaccine (1 of 2) Never done   COVID-19 Vaccine (4 - 2023-24 season) 10/24/2021   Complete foot exam   11/01/2021   Flu Shot  05/24/2022*   Pneumonia Vaccine (1 of 2 - PCV) 12/11/2022*   Hemoglobin A1C  05/30/2022   Yearly kidney health urinalysis for diabetes  11/29/2022   Eye exam for diabetics  01/28/2023   Yearly kidney function blood test for diabetes  03/06/2023   Medicare Annual Wellness Visit  04/21/2023   DTaP/Tdap/Td vaccine (2 - Td or Tdap) 10/12/2028   Hepatitis C Screening: USPSTF Recommendation to screen - Ages 161-79yo.  Completed  HPV Vaccine  Aged Out  *Topic was postponed. The date shown is not the original due date.

## 2022-04-27 ENCOUNTER — Other Ambulatory Visit: Payer: Self-pay

## 2022-04-27 MED ORDER — METOPROLOL SUCCINATE ER 25 MG PO TB24: 37.5000 mg | ORAL_TABLET | Freq: Every day | ORAL | 3 refills | Status: DC

## 2022-05-02 DIAGNOSIS — Z1211 Encounter for screening for malignant neoplasm of colon: Secondary | ICD-10-CM | POA: Diagnosis not present

## 2022-05-11 ENCOUNTER — Ambulatory Visit (INDEPENDENT_AMBULATORY_CARE_PROVIDER_SITE_OTHER): Payer: 59 | Admitting: Orthopedic Surgery

## 2022-05-11 ENCOUNTER — Encounter: Payer: Self-pay | Admitting: Orthopedic Surgery

## 2022-05-11 DIAGNOSIS — L97521 Non-pressure chronic ulcer of other part of left foot limited to breakdown of skin: Secondary | ICD-10-CM

## 2022-05-11 DIAGNOSIS — Z89431 Acquired absence of right foot: Secondary | ICD-10-CM

## 2022-05-11 LAB — COLOGUARD: COLOGUARD: NEGATIVE

## 2022-05-11 NOTE — Progress Notes (Signed)
Patient ID: DOIS BILLING, male    DOB: 02/13/1956  MRN: [id number]  CC: Annual Physical Exam  Subjective: Curtis Watkins is a 67 y.o. male who presents for annual physical exam.   His concerns today include:  - Established with Cardiology.  - Established with Endocrinology.  - Established with Nephrology.  - Established with Ophthalmology.  - Requests routine STD screening. Denies present symptoms.   Patient Active Problem List   Diagnosis Date Noted   Influenza 03/06/2022   Elevated troponin 03/06/2022   Generalized weakness 03/05/2022   Trichomonas infection 07/31/2021   Erectile dysfunction 03/03/2021   HFrEF (heart failure with reduced ejection fraction) ([place]) 08/21/2020   Acute on chronic systolic and diastolic heart failure, NYHA class 3 ([place]) 05/25/2020   Pleural effusion    Gangrene of toe of right foot (HCC)    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   Coronary artery disease involving native coronary artery of native heart with unstable angina pectoris (HCC)    Acute congestive heart failure ([place]) 03/27/2020   Diabetic foot ulcers ([place]) 03/27/2020   DM2 (diabetes mellitus, type 2) ([place]) 03/27/2020   HTN (hypertension) 03/27/2020     Current Outpatient Medications on File Prior to Visit  Medication Sig Dispense Refill   aspirin EC 81 MG tablet Take 81 mg by mouth daily.     atorvastatin (LIPITOR) 80 MG tablet Take 1 tablet (80 mg total) by mouth daily. 90 tablet 3   blood glucose meter kit and supplies by Other route as directed. Dispense based on patient and insurance preference. Use up to 2x times daily as directed. (FOR ICD-10 E10.9, E11.9).     brimonidine (ALPHAGAN) 0.2 % ophthalmic solution Place 1 drop into the left eye 2 (two) times daily. 15 mL 11   dorzolamide-timolol (COSOPT) 22.3-6.8 MG/ML ophthalmic solution Place 1 drop into the left eye 2 (two) times daily. 10 mL 11   empagliflozin (JARDIANCE) 25 MG TABS tablet Take 1 tablet (25 mg total) by mouth daily before  breakfast. 90 tablet 3   furosemide (LASIX) 20 MG tablet Take 1 tablet (20 mg total) by mouth 3 (three) times a week. Take on Mondays, Wednesdays, and Fridays. 45 tablet 3   losartan (COZAAR) 25 MG tablet Take 1/2 tablet (12.5 mg total) by mouth daily. 45 tablet 3   metoprolol succinate (TOPROL XL) 25 MG 24 hr tablet Take 1.5 tablets (37.5 mg total) by mouth daily. 135 tablet 3   spironolactone (ALDACTONE) 25 MG tablet Take 0.5 tablets (12.5 mg total) by mouth daily. 45 tablet 3   No current facility-administered medications on file prior to visit.    No Known Allergies  Social History   Socioeconomic History   Marital status: Significant Other    Spouse name: Not on file   Number of children: 3   Years of education: Not on file   Highest education level: High school graduate  Occupational History   Occupation: retired d/t medical conditions  Tobacco Use   Smoking status: Every Day    Packs/day: 1.00    Years: 0.50    Additional pack years: 0.00    Total pack years: 0.50    Types: Cigarettes    Passive exposure: Current   Smokeless tobacco: Never  Vaping Use   Vaping Use: Never used  Substance and Sexual Activity   Alcohol use: Not Currently    Alcohol/week: 1.0 standard drink of alcohol    Types: 1 Cans of beer per  week    Comment: occassionally beer   Drug use: Not Currently    Types: Marijuana    Comment: marijuana last use 3 xweek last used 02-23-2021   Sexual activity: Not Currently  Other Topics Concern   Not on file  Social History Narrative   Not on file   Social Determinants of Health   Financial Resource Strain: High Risk (05/28/2020)   Overall Financial Resource Strain (CARDIA)    Difficulty of Paying Living Expenses: Hard  Food Insecurity: No Food Insecurity (04/20/2022)   Hunger Vital Sign    Worried About Running Out of Food in the Last Year: Never true    Ran Out of Food in the Last Year: Never true  Transportation Needs: No Transportation Needs  (04/20/2022)   PRAPARE - Hydrologist (Medical): No    Lack of Transportation (Non-Medical): No  Physical Activity: Inactive (05/28/2020)   Exercise Vital Sign    Days of Exercise per Week: 0 days    Minutes of Exercise per Session: 0 min  Stress: Not on file  Social Connections: Not on file  Intimate Partner Violence: Not At Risk (04/20/2022)   Humiliation, Afraid, Rape, and Kick questionnaire    Fear of Current or Ex-Partner: No    Emotionally Abused: No    Physically Abused: No    Sexually Abused: No    Family History  Problem Relation Age of Onset   Diabetes Mother     Past Surgical History:  Procedure Laterality Date   AMPUTATION Right 04/24/2020   Procedure: RIGHT 2ND AND 3RD TOE AMPUTATION;  Surgeon: Newt Minion, MD;  Location: [facility];  Service: Orthopedics;  Laterality: Right;   AMPUTATION Right 10/22/2021   Procedure: RIGHT TRANSMETATARSAL AMPUTATION;  Surgeon: Newt Minion, MD;  Location: [facility];  Service: Orthopedics;  Laterality: Right;   APPLICATION OF WOUND VAC Right 10/22/2021   Procedure: APPLICATION OF WOUND VAC;  Surgeon: Newt Minion, MD;  Location: [facility];  Service: Orthopedics;  Laterality: Right;   CENTRAL VENOUS CATHETER INSERTION Left 04/03/2020   Procedure: INSERTION OF FEMORAL ARTERIAL LINE;  Surgeon: Wonda Olds, MD;  Location: [facility];  Service: Open Heart Surgery;  Laterality: Left;   CORONARY ARTERY BYPASS GRAFT N/A 04/03/2020   Procedure: CORONARY ARTERY BYPASS GRAFTING (CABG) TIMES FOUR USING LEFT INTERNAL MAMMARY ARTERY AND LEFT RADIAL ARTERY.;  Surgeon: Wonda Olds, MD;  Location: [facility];  Service: Open Heart Surgery;  Laterality: N/A;   CORONARY PRESSURE WIRE/FFR WITH 3D MAPPING N/A 03/29/2020   Procedure: Coronary Pressure Wire/FFR w/3D Mapping;  Surgeon: Belva Crome, MD;  Location: [facility];  Service: Cardiovascular;  Laterality: N/A;   EYE SURGERY Right 01/21/2021   right eye cataract lens  replacement surgery   IR THORACENTESIS ASP PLEURAL SPACE W/IMG GUIDE  05/03/2020   IR THORACENTESIS ASP PLEURAL SPACE W/IMG GUIDE  05/16/2020   LEFT HEART CATH AND CORONARY ANGIOGRAPHY N/A 03/29/2020   Procedure: LEFT HEART CATH AND CORONARY ANGIOGRAPHY;  Surgeon: Belva Crome, MD;  Location: [facility];  Service: Cardiovascular;  Laterality: N/A;   PENILE PROSTHESIS IMPLANT N/A 03/03/2021   Procedure: PENILE PROTHESIS INFLATABLE COLOPLAST;  Surgeon: Lucas Mallow, MD;  Location: [facility];  Service: Urology;  Laterality: N/A;  REQUESTING 2 HRS   RADIAL ARTERY HARVEST Left 04/03/2020   Procedure: LEFT RADIAL ARTERY HARVEST;  Surgeon: Wonda Olds, MD;  [facility];  Service: Open Heart Surgery;  Laterality: Left;   TEE WITHOUT CARDIOVERSION N/A 04/03/2020   Procedure: TRANSESOPHAGEAL ECHOCARDIOGRAM (TEE);  Surgeon: Wonda Olds, MD;  Location: [facility];  Service: Open Heart Surgery;  Laterality: N/A;   UPPER EXTREMITY ANGIOGRAPHY  03/29/2020   Procedure: Upper Extremity Angiography;  Surgeon: Belva Crome, MD;  Location: [facility];  Service: Cardiovascular;;  rt.  arm    ROS: Review of Systems Negative except as stated above  PHYSICAL EXAM: BP 131/71 (BP Location: Left Arm, Patient Position: Sitting, Cuff Size: Normal)   Pulse 68   Resp 16   Ht 5\' 10"  (1.778 m)   Wt 160 lb (72.6 kg)   SpO2 98%   BMI 22.96 kg/m   Physical Exam HENT:     Head: Normocephalic and atraumatic.     Right Ear: Tympanic membrane, ear canal and external ear normal.     Left Ear: Tympanic membrane, ear canal and external ear normal.     Nose: Nose normal.     Mouth/Throat:     Mouth: Mucous membranes are moist.     Pharynx: Oropharynx is clear.  Eyes:     Extraocular Movements: Extraocular movements intact.     Conjunctiva/sclera: Conjunctivae normal.     Pupils: Pupils are equal, round, and reactive to light.  Cardiovascular:     Rate and Rhythm:  Normal rate and regular rhythm.     Pulses: Normal pulses.     Heart sounds: Normal heart sounds.  Pulmonary:     Effort: Pulmonary effort is normal.     Breath sounds: Normal breath sounds.  Abdominal:     General: Bowel sounds are normal.     Palpations: Abdomen is soft.  Genitourinary:    Comments: Patient declined.  Musculoskeletal:        General: Normal range of motion.     Right shoulder: Normal.     Left shoulder: Normal.     Right upper arm: Normal.     Left upper arm: Normal.     Right elbow: Normal.     Left elbow: Normal.     Right forearm: Normal.     Left forearm: Normal.     Right wrist: Normal.     Left wrist: Normal.     Right hand: Normal.     Left hand: Normal.     Cervical back: Normal, normal range of motion and neck supple.     Thoracic back: Normal.     Lumbar back: Normal.     Right hip: Normal.     Left hip: Normal.     Right upper leg: Normal.     Left upper leg: Normal.     Right knee: Normal.     Left knee: Normal.     Right lower leg: Normal.     Left lower leg: Normal.     Right ankle: Normal.     Left ankle: Normal.     Right foot: Normal.     Left foot: Normal.  Skin:    General: Skin is warm and dry.     Capillary Refill: Capillary refill takes less than 2 seconds.  Neurological:     General: No focal deficit present.     Mental Status: He is alert and oriented to person, place, and time.  Psychiatric:        Mood and Affect: Mood normal.        Behavior: Behavior normal.  ASSESSMENT AND PLAN: 1. Annual physical exam - Counseled on 150 minutes of exercise per week as tolerated, healthy eating (including decreased daily intake of saturated fats, cholesterol, added sugars, sodium), STI prevention, and routine healthcare maintenance.  2. Thyroid disorder screen - Routine screening.  - TSH  3. Routine screening for STI (sexually transmitted infection) - Routine screening.  - Urine cytology ancillary only - RPR  4.  Encounter for screening for HIV - Routine screening.  - HIV Antibody (routine testing w rflx)   Patient was given the opportunity to ask questions.  Patient verbalized understanding of the plan and was able to repeat key elements of the plan. Patient was given clear instructions to go to Emergency Department or return to medical center if symptoms don't improve, worsen, or new problems develop.The patient verbalized understanding.   Orders Placed This Encounter  Procedures   TSH   RPR   HIV Antibody (routine testing w rflx)    Return in about 1 year (around 05/15/2023) for Physical per patient preference.  Camillia Herter, NP

## 2022-05-11 NOTE — Progress Notes (Signed)
Office Visit Note   Patient: Curtis Watkins           Date of Birth: 1955/10/17           MRN: [id number] Visit Date: 05/11/2022              Requested by: Camillia Herter, NP [address] [place],  [place] [address] PCP: Camillia Herter, NP  Chief Complaint  Patient presents with   Right Foot - Follow-up    Transmet amp ulcer   Left Foot - Follow-up    Ulcer       HPI: Patient is a 67 year old gentleman who is seen in follow-up for right transmetatarsal amputation and ulceration beneath the left great toe metatarsal head.  Assessment & Plan: Visit Diagnoses:  1. Ulcer of left foot, limited to breakdown of skin ([place])   2. History of transmetatarsal amputation of right foot ([place])     Plan: Patient was provided a postoperative shoe and a felt relieving donut to further relieve pressure.  Follow-Up Instructions: Return in about 3 weeks (around 06/01/2022).   Ortho Exam  Patient is alert, oriented, no adenopathy, well-dressed, normal affect, normal respiratory effort. Examination patient has a stable right transmetatarsal amputation.  Examination left foot he has a Wagner grade 1 ulcer beneath the left great toe metatarsal head.  After informed consent a 10 blade knife was used to debride the skin and soft tissue back to healthy viable tissue.  This was touched with silver nitrate the wound measures 3 x 4 cm and 2 mm deep after debridement.  Patient has a callus pared beneath the fourth metatarsal head that is 2 cm in diameter but no open wound.  He has good dorsiflexion of the ankle about 10 degrees past neutral.  Imaging: No results found. No images are attached to the encounter.  Labs: Lab Results  Component Value Date   HGBA1C 6.9 (A) 11/28/2021   HGBA1C 6.7 (A) 02/07/2021   HGBA1C 6.7 (A) 11/01/2020     Lab Results  Component Value Date   ALBUMIN 4.0 08/02/2020    Lab Results  Component Value Date   MG 2.6 (H) 04/04/2020   MG 2.6 (H) 04/04/2020    MG 2.9 (H) 04/03/2020   No results found for: "VD25OH"  No results found for: "PREALBUMIN"    Latest Ref Rng & Units 03/06/2022    4:34 AM 03/05/2022    8:21 PM 10/22/2021    8:24 AM  CBC EXTENDED  WBC 4.0 - 10.5 K/uL 11.3  10.6  8.0   RBC 4.22 - 5.81 MIL/uL 4.32  3.80  3.40   Hemoglobin 13.0 - 17.0 g/dL 13.4  11.8  10.6   HCT 39.0 - 52.0 % 38.9  34.2  32.3   Platelets 150 - 400 K/uL 118  94  307      There is no height or weight on file to calculate BMI.  Orders:  No orders of the defined types were placed in this encounter.  No orders of the defined types were placed in this encounter.    Procedures: No procedures performed  Clinical Data: No additional findings.  ROS:  All other systems negative, except as noted in the HPI. Review of Systems  Objective: Vital Signs: There were no vitals taken for this visit.  Specialty Comments:  No specialty comments available.  PMFS History: Patient Active Problem List   Diagnosis Date Noted   Influenza 03/06/2022  Elevated troponin 03/06/2022   Generalized weakness 03/05/2022   Trichomonas infection 07/31/2021   Erectile dysfunction 03/03/2021   HFrEF (heart failure with reduced ejection fraction) ([place]) 08/21/2020   Acute on chronic systolic and diastolic heart failure, NYHA class 3 ([place]) 05/25/2020   Pleural effusion    Gangrene of toe of right foot ([place])    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   Coronary artery disease involving native coronary artery of native heart with unstable angina pectoris ([place])    Acute congestive heart failure ([place]) 03/27/2020   Diabetic foot ulcers ([place]) 03/27/2020   DM2 (diabetes mellitus, type 2) ([place]) 03/27/2020   HTN (hypertension) 03/27/2020   Past Medical History:  Diagnosis Date   Blind right eye    Carotid artery disease ([place])    04/01/20: 60-79% RICA stenosis, 123456 LICA stenosis   Chronic kidney disease    ckd stage 3 a per dr Malva Limes 02-05-2021 epic   Coronary artery disease     NSTEMI s/p CABG in 03/2020 (LIMA-->LAD/diag, L radial-->OM1/OM3); b.   DM type 2    Glaucoma of right eye, unspecified glaucoma type    HFrEF (heart failure with reduced ejection fraction) ([place])    Echo 6/22: EF 58, global HK, mild MR, RVSP 31.9   Hypertension    Ischemic cardiomyopathy    PAD (peripheral artery disease) (HCC)    occluded right axillary artery 03/29/20     Family History  Problem Relation Age of Onset   Diabetes Mother     Past Surgical History:  Procedure Laterality Date   AMPUTATION Right 04/24/2020   Procedure: RIGHT 2ND AND 3RD TOE AMPUTATION;  Surgeon: Newt Minion, MD;  Location: [facility];  Service: Orthopedics;  Laterality: Right;   AMPUTATION Right 10/22/2021   Procedure: RIGHT TRANSMETATARSAL AMPUTATION;  Surgeon: Newt Minion, MD;  Location: [facility];  Service: Orthopedics;  Laterality: Right;   APPLICATION OF WOUND VAC Right 10/22/2021   Procedure: APPLICATION OF WOUND VAC;  Surgeon: Newt Minion, MD;  Location: [facility];  Service: Orthopedics;  Laterality: Right;   CENTRAL VENOUS CATHETER INSERTION Left 04/03/2020   Procedure: INSERTION OF FEMORAL ARTERIAL LINE;  Surgeon: Wonda Olds, MD;  Location: [facility];  Service: Open Heart Surgery;  Laterality: Left;   CORONARY ARTERY BYPASS GRAFT N/A 04/03/2020   Procedure: CORONARY ARTERY BYPASS GRAFTING (CABG) TIMES FOUR USING LEFT INTERNAL MAMMARY ARTERY AND LEFT RADIAL ARTERY.;  Surgeon: Wonda Olds, MD;  Location: [facility];  Service: Open Heart Surgery;  Laterality: N/A;   CORONARY PRESSURE WIRE/FFR WITH 3D MAPPING N/A 03/29/2020   Procedure: Coronary Pressure Wire/FFR w/3D Mapping;  Surgeon: Belva Crome, MD;  Location: [facility];  Service: Cardiovascular;  Laterality: N/A;   EYE SURGERY Right 01/21/2021   right eye cataract lens replacement surgery   IR THORACENTESIS ASP PLEURAL SPACE W/IMG GUIDE  05/03/2020   IR THORACENTESIS ASP PLEURAL SPACE W/IMG GUIDE  05/16/2020   LEFT HEART CATH AND CORONARY  ANGIOGRAPHY N/A 03/29/2020   Procedure: LEFT HEART CATH AND CORONARY ANGIOGRAPHY;  Surgeon: Belva Crome, MD;  Location: [facility];  Service: Cardiovascular;  Laterality: N/A;   PENILE PROSTHESIS IMPLANT N/A 03/03/2021   Procedure: PENILE PROTHESIS INFLATABLE COLOPLAST;  Surgeon: Lucas Mallow, MD;  Location: [facility];  Service: Urology;  Laterality: N/A;  REQUESTING 2 HRS   RADIAL ARTERY HARVEST Left 04/03/2020   Procedure: LEFT RADIAL ARTERY HARVEST;  Surgeon: Orvan Seen,  Glenice Bow, MD;  Location: [facility];  Service: Open Heart Surgery;  Laterality: Left;   TEE WITHOUT CARDIOVERSION N/A 04/03/2020   Procedure: TRANSESOPHAGEAL ECHOCARDIOGRAM (TEE);  Surgeon: Wonda Olds, MD;  Location: [facility];  Service: Open Heart Surgery;  Laterality: N/A;   UPPER EXTREMITY ANGIOGRAPHY  03/29/2020   Procedure: Upper Extremity Angiography;  Surgeon: Belva Crome, MD;  Location: [facility];  Service: Cardiovascular;;  rt.  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

## 2022-05-12 DIAGNOSIS — E113212 Type 2 diabetes mellitus with mild nonproliferative diabetic retinopathy with macular edema, left eye: Secondary | ICD-10-CM | POA: Diagnosis not present

## 2022-05-12 DIAGNOSIS — Z794 Long term (current) use of insulin: Secondary | ICD-10-CM | POA: Diagnosis not present

## 2022-05-12 NOTE — Telephone Encounter (Signed)
wrroThis encounter was created in error - please disregard.

## 2022-05-15 ENCOUNTER — Ambulatory Visit (INDEPENDENT_AMBULATORY_CARE_PROVIDER_SITE_OTHER): Payer: 59 | Admitting: Family

## 2022-05-15 ENCOUNTER — Other Ambulatory Visit (HOSPITAL_COMMUNITY)
Admission: RE | Admit: 2022-05-15 | Discharge: 2022-05-15 | Disposition: A | Discharge status: Home / Self Care | Payer: 59 | Source: Ambulatory Visit | Attending: Family | Admitting: Family

## 2022-05-15 ENCOUNTER — Encounter: Payer: Self-pay | Admitting: Family

## 2022-05-15 VITALS — BP 131/71 | HR 68 | Resp 16 | Ht 70.0 in | Wt 160.0 lb

## 2022-05-15 DIAGNOSIS — Z113 Encounter for screening for infections with a predominantly sexual mode of transmission: Secondary | ICD-10-CM | POA: Diagnosis present

## 2022-05-15 DIAGNOSIS — Z1329 Encounter for screening for other suspected endocrine disorder: Secondary | ICD-10-CM

## 2022-05-15 DIAGNOSIS — Z0000 Encounter for general adult medical examination without abnormal findings: Secondary | ICD-10-CM | POA: Diagnosis not present

## 2022-05-15 DIAGNOSIS — Z114 Encounter for screening for human immunodeficiency virus [HIV]: Secondary | ICD-10-CM

## 2022-05-15 NOTE — Progress Notes (Signed)
.  Pt presents for annual physical exam  -desires STD testing  -HIV test request

## 2022-05-18 ENCOUNTER — Other Ambulatory Visit (HOSPITAL_COMMUNITY): Payer: Self-pay

## 2022-05-18 ENCOUNTER — Other Ambulatory Visit: Payer: Self-pay

## 2022-05-18 ENCOUNTER — Other Ambulatory Visit: Payer: Self-pay | Admitting: *Deleted

## 2022-05-18 DIAGNOSIS — Z720 Tobacco use: Secondary | ICD-10-CM

## 2022-05-18 DIAGNOSIS — I5020 Unspecified systolic (congestive) heart failure: Secondary | ICD-10-CM

## 2022-05-18 DIAGNOSIS — N1831 Chronic kidney disease, stage 3a: Secondary | ICD-10-CM

## 2022-05-18 DIAGNOSIS — I779 Disorder of arteries and arterioles, unspecified: Secondary | ICD-10-CM

## 2022-05-18 DIAGNOSIS — E1100 Type 2 diabetes mellitus with hyperosmolarity without nonketotic hyperglycemic-hyperosmolar coma (NKHHC): Secondary | ICD-10-CM

## 2022-05-18 DIAGNOSIS — I10 Essential (primary) hypertension: Secondary | ICD-10-CM

## 2022-05-18 DIAGNOSIS — I5022 Chronic systolic (congestive) heart failure: Secondary | ICD-10-CM

## 2022-05-18 DIAGNOSIS — I2510 Atherosclerotic heart disease of native coronary artery without angina pectoris: Secondary | ICD-10-CM

## 2022-05-18 LAB — URINE CYTOLOGY ANCILLARY ONLY
Chlamydia: NEGATIVE
Comment: NEGATIVE
Comment: NEGATIVE
Comment: NORMAL
Neisseria Gonorrhea: NEGATIVE
Trichomonas: NEGATIVE

## 2022-05-18 MED ORDER — FUROSEMIDE 20 MG PO TABS
20.0000 mg | ORAL_TABLET | ORAL | 3 refills | Status: DC
  Filled 2022-05-18: qty 36, 84d supply, fill #0
  Filled 2022-09-02: qty 36, 84d supply, fill #1
  Filled 2022-11-18 (×2): qty 36, 84d supply, fill #2

## 2022-05-19 ENCOUNTER — Other Ambulatory Visit (HOSPITAL_COMMUNITY): Payer: Self-pay

## 2022-05-19 ENCOUNTER — Telehealth: Payer: Self-pay

## 2022-05-19 ENCOUNTER — Other Ambulatory Visit: Payer: Self-pay | Admitting: Family

## 2022-05-19 NOTE — Telephone Encounter (Signed)
Copied from [place] [phone]. Topic: General - Inquiry >> May 19, 2022  9:56 AM Penni Bombard wrote: Reason for CRM: pt is calling in regards to his test results from Friday CB@  [phone]   Spoke to pt aware of labs

## 2022-05-20 ENCOUNTER — Other Ambulatory Visit (HOSPITAL_COMMUNITY): Payer: Self-pay

## 2022-05-22 ENCOUNTER — Other Ambulatory Visit (HOSPITAL_COMMUNITY): Payer: Self-pay

## 2022-05-22 MED ORDER — BRIMONIDINE TARTRATE 0.2 % OP SOLN
1.0000 [drp] | Freq: Two times a day (BID) | OPHTHALMIC | 11 refills | Status: DC
  Filled 2022-05-22: qty 15, 100d supply, fill #0
  Filled 2022-05-22: qty 10, 75d supply, fill #0
  Filled 2022-09-02: qty 10, 75d supply, fill #1

## 2022-05-22 MED ORDER — DORZOLAMIDE HCL-TIMOLOL MAL 2-0.5 % OP SOLN
1.0000 [drp] | Freq: Two times a day (BID) | OPHTHALMIC | 11 refills | Status: DC
  Filled 2022-05-22: qty 10, 75d supply, fill #0
  Filled 2022-09-02: qty 10, 75d supply, fill #1

## 2022-05-26 LAB — SPECIMEN STATUS REPORT

## 2022-05-26 LAB — TSH: TSH: 1.45 u[IU]/mL (ref 0.450–4.500)

## 2022-05-26 LAB — HIV ANTIBODY (ROUTINE TESTING W REFLEX): HIV Screen 4th Generation wRfx: NONREACTIVE

## 2022-05-26 LAB — RPR: RPR Ser Ql: NONREACTIVE

## 2022-06-01 ENCOUNTER — Ambulatory Visit (INDEPENDENT_AMBULATORY_CARE_PROVIDER_SITE_OTHER): Payer: 59 | Admitting: Orthopedic Surgery

## 2022-06-01 ENCOUNTER — Encounter: Payer: Self-pay | Admitting: Orthopedic Surgery

## 2022-06-01 DIAGNOSIS — L97521 Non-pressure chronic ulcer of other part of left foot limited to breakdown of skin: Secondary | ICD-10-CM

## 2022-06-01 DIAGNOSIS — Z89431 Acquired absence of right foot: Secondary | ICD-10-CM | POA: Diagnosis not present

## 2022-06-01 NOTE — Progress Notes (Signed)
Office Visit Note   Patient: Curtis Watkins           Date of Birth: 1955-08-11           MRN: [id number] Visit Date: 06/01/2022              Requested by: Rema FendtStephens, Amy J, NP [address] [place]  [place] [id number] PCP: Rema FendtStephens, Amy J, NP  Chief Complaint  Patient presents with   Left Foot - Follow-up   Right Foot - Follow-up    Right transmet ulcer      HPI: Patient is a 67 year old gentleman who [facility] plantar aspect of the left foot.  Patient is currently wearing the postoperative shoe with a felt relieving donut.  Assessment & Plan: Visit Diagnoses:  1. Ulcer of left foot, limited to breakdown of skin   2. History of transmetatarsal amputation of right foot     Plan: Callus beneath the fourth metatarsal head was pared ulcer debrided of skin and soft tissue beneath the first metatarsal head.  Patient was provided new felt relieving donut pads.  Follow-Up Instructions: Return in about 4 weeks (around 06/29/2022).   Ortho Exam  Patient is alert, oriented, no adenopathy, well-dressed, normal affect, normal respiratory effort. Examination patient has persistent ulceration beneath the first metatarsal head and callus beneath the fourth metatarsal head.  He has thickened discolored onychomycotic nails x 4.  After informed consent the nails were trimmed x 4 without complication no evidence of infection.  After informed consent a 10 blade knife was used to debride the skin and soft tissue of the first metatarsal head ulcer and pare the callus of the fourth metatarsal head ulcer.  After debridement the first metatarsal head ulcer is 4 cm in diameter and 3 mm deep in the callus beneath the fourth metatarsal head is 3 cm in diameter and 1 mm deep.  Imaging: No results found. No images are attached to the encounter.  Labs: Lab Results  Component Value Date   HGBA1C 6.9 (A) 11/28/2021   HGBA1C 6.7 (A) 02/07/2021   HGBA1C 6.7  (A) 11/01/2020     Lab Results  Component Value Date   ALBUMIN 4.0 08/02/2020    Lab Results  Component Value Date   MG 2.6 (H) 04/04/2020   MG 2.6 (H) 04/04/2020   MG 2.9 (H) 04/03/2020   No results found for: "VD25OH"  No results found for: "PREALBUMIN"    Latest Ref Rng & Units 03/06/2022    4:34 AM 03/05/2022    8:21 PM 10/22/2021    8:24 AM  CBC EXTENDED  WBC 4.0 - 10.5 K/uL 11.3  10.6  8.0   RBC 4.22 - 5.81 MIL/uL 4.32  3.80  3.40   Hemoglobin 13.0 - 17.0 g/dL 19.113.4  47.811.8  29.510.6   HCT 39.0 - 52.0 % 38.9  34.2  32.3   Platelets 150 - 400 K/uL 118  94  307      There is no height or weight on file to calculate BMI.  Orders:  No orders of the defined types were placed in this encounter.  No orders of the defined types were placed in this encounter.    Procedures: No procedures performed  Clinical Data: No additional findings.  ROS:  All other systems negative, except as noted in the HPI. Review of Systems  Objective: Vital Signs: There were no vitals taken for this visit.  Specialty  Comments:  No specialty comments available.  PMFS History: Patient Active Problem List   Diagnosis Date Noted   Influenza 03/06/2022   Elevated troponin 03/06/2022   Generalized weakness 03/05/2022   Trichomonas infection 07/31/2021   Erectile dysfunction 03/03/2021   HFrEF (heart failure with reduced ejection fraction) 08/21/2020   Acute on chronic systolic and diastolic heart failure, NYHA class 3 05/25/2020   Pleural effusion    Gangrene of toe of right foot    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   Coronary artery disease involving native coronary artery of native heart with unstable angina pectoris    Acute congestive heart failure 03/27/2020   Diabetic foot ulcers 03/27/2020   DM2 (diabetes mellitus, type 2) 03/27/2020   HTN (hypertension) 03/27/2020   Past Medical History:  Diagnosis Date   Blind right eye    Carotid artery disease    04/01/20: 60-79% RICA stenosis,  1-39% LICA stenosis   Chronic kidney disease    ckd stage 3 a per dr Ronald Lobo 02-05-2021 epic   Coronary artery disease    NSTEMI s/p CABG in 03/2020 (LIMA-->LAD/diag, L radial-->OM1/OM3); b.   DM type 2    Glaucoma of right eye, unspecified glaucoma type    HFrEF (heart failure with reduced ejection fraction)    Echo 6/22: EF 45, global HK, mild MR, RVSP 31.9   Hypertension    Ischemic cardiomyopathy    PAD (peripheral artery disease)    occluded right axillary artery 03/29/20     Family History  Problem Relation Age of Onset   Diabetes Mother     Past Surgical History:  Procedure Laterality Date   AMPUTATION Right 04/24/2020   Procedure: RIGHT 2ND AND 3RD TOE AMPUTATION;  Surgeon: Nadara Mustard, MD;  [facility];  Service: Orthopedics;  Laterality: Right;   AMPUTATION Right 10/22/2021   Procedure: RIGHT TRANSMETATARSAL AMPUTATION;  Surgeon: Nadara Mustard, MD;  Location[facility] OR;  Service: Orthopedics;  Laterality: Right;   APPLICATION OF WOUND VAC Right 10/22/2021   Procedure: APPLICATION OF WOUND VAC;  Surgeon: Nadara Mustard, MD;  [facility];  Service: Orthopedics;  Laterality: Right;   CENTRAL VENOUS CATHETER INSERTION Left 04/03/2020   Procedure: INSERTION OF FEMORAL ARTERIAL LINE;  Surgeon: Linden Dolin, MD;  [facility];  Service: Open Heart Surgery;  Laterality: Left;   CORONARY ARTERY BYPASS GRAFT N/A 04/03/2020   Procedure: CORONARY ARTERY BYPASS GRAFTING (CABG) TIMES FOUR USING LEFT INTERNAL MAMMARY ARTERY AND LEFT RADIAL ARTERY.;  Surgeon: Linden Dolin, MD;  [facility];  Service: Open Heart Surgery;  Laterality: N/A;   CORONARY PRESSURE/FFR WITH 3D MAPPING N/A 03/29/2020   Procedure: Coronary Pressure Wire/FFR w/3D Mapping;  Surgeon: Lyn Records, MD;  [facility] INVAS[facility] LAB;  Service: Cardiovascular;  Laterality: N/A;   EYE SURGERY Right 01/21/2021   right eye cataract lens replacement surgery   IR THORACENTESIS ASP PLEURAL SPACE  W/IMG GUIDE  05/03/2020   IR THORACENTESIS ASP PLEURAL SPACE W/IMG GUIDE  05/16/2020   LEFT HEART CATH AND CORONARY ANGIOGRAPHY N/A 03/29/2020   Procedure: LEFT HEART CATH AND CORONARY ANGIOGRAPHY;  Surgeon: Lyn Records, MD;  [facility];  Service: Cardiovascular;  Laterality: N/A;   PENILE PROSTHESIS IMPLANT N/A 03/03/2021   Procedure: PENILE PROTHESIS INFLATABLE COLOPLAST;  Surgeon: Crista Elliot, MD;  Location[facility];  Service: Urology;  Laterality: N/A;  REQUESTING 2 HRS   RADIAL ARTERY  HARVEST Left 04/03/2020   Procedure: LEFT RADIAL ARTERY HARVEST;  Surgeon: Linden Dolin, MD;  [facility];  Service: Open Heart Surgery;  Laterality: Left;   TEE WITHOUT CARDIOVERSION N/A 04/03/2020   Procedure: TRANSESOPHAGEAL ECHOCARDIOGRAM (TEE);  Surgeon: Linden Dolin, MD;  Location: [facility] OR;  Service: Open Heart Surgery;  Laterality: N/A;   UPPER EXTREMITY ANGIOGRAPHY  03/29/2020   Procedure: Upper Extremity Angiography;  Surgeon: Lyn Records, MD;  Location: [facility] INVASIVE CV LAB;  Service: Cardiovascular;;  rt.  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

## 2022-06-04 ENCOUNTER — Other Ambulatory Visit (HOSPITAL_COMMUNITY): Payer: Self-pay

## 2022-06-04 ENCOUNTER — Other Ambulatory Visit (HOSPITAL_BASED_OUTPATIENT_CLINIC_OR_DEPARTMENT_OTHER): Payer: Self-pay | Admitting: Family

## 2022-06-04 NOTE — Telephone Encounter (Signed)
Requested Prescriptions  Refused Prescriptions Disp Refills   spironolactone (ALDACTONE) 25 MG tablet 45 tablet 2    Sig: Take 1/2 tablet (12.5 mg total) by mouth daily.     There is no refill protocol information for this order

## 2022-06-05 ENCOUNTER — Telehealth: Payer: Self-pay | Admitting: Family

## 2022-06-05 ENCOUNTER — Other Ambulatory Visit: Payer: Self-pay

## 2022-06-05 ENCOUNTER — Other Ambulatory Visit (HOSPITAL_BASED_OUTPATIENT_CLINIC_OR_DEPARTMENT_OTHER): Payer: Self-pay | Admitting: Family

## 2022-06-05 NOTE — Telephone Encounter (Signed)
Medication Refill - Medication: spironolactone (ALDACTONE) 25 MG   Has the patient contacted their pharmacy? Yes.   Pharmacy said it was denied and they would send the request  Preferred Pharmacy (with phone number or street name):  [facility] Phone: [phone]  Fax: [phone]     Has the patient been seen for an appointment in the last year OR does the patient have an upcoming appointment? No.  Agent: Please be advised that RX refills may take up to 3 business days. We ask that you follow-up with your pharmacy.  Patent stated he has enough to last him for 3 days

## 2022-06-05 NOTE — Telephone Encounter (Signed)
Spironolactone managed by Cardiology. Request refills from the same.

## 2022-06-08 ENCOUNTER — Other Ambulatory Visit (HOSPITAL_BASED_OUTPATIENT_CLINIC_OR_DEPARTMENT_OTHER): Payer: Self-pay | Admitting: Family

## 2022-06-08 ENCOUNTER — Other Ambulatory Visit: Payer: Self-pay | Admitting: Family

## 2022-06-08 ENCOUNTER — Other Ambulatory Visit (HOSPITAL_COMMUNITY): Payer: Self-pay

## 2022-06-08 NOTE — Telephone Encounter (Unsigned)
Copied from CRM [id number]. Topic: General - Other >> Jun 08, 2022 12:13 PM Everette C wrote: Reason for CRM: Medication Refill - Medication: spironolactone (ALDACTONE) 25 MG tablet [[id number]]  Has the patient contacted their pharmacy? Yes.   (Agent: If no, request that the patient contact the pharmacy for the refill. If patient does not wish to contact the pharmacy document the reason why and proceed with request.) (Agent: If yes, when and what did the pharmacy advise?)  Preferred Pharmacy (with phone number or street name): [facility] [address] [place] [address] Phone: [phone] Fax: [phone] Hours: M-F 7:30am-6pm   Has the patient been seen for an appointment in the last year OR does the patient have an upcoming appointment? Yes.    Agent: Please be advised that RX refills may take up to 3 business days. We ask that you follow-up with your pharmacy.

## 2022-06-09 NOTE — Telephone Encounter (Signed)
Requested medications are due for refill today.  unsure  Requested medications are on the active medications list.  yes  Last refill. 03/20/2022 #45 3 rf  Future visit scheduled.   no  Notes to clinic.  Pt also has lasix on med list. Please review.    Requested Prescriptions  Pending Prescriptions Disp Refills   spironolactone (ALDACTONE) 25 MG tablet 45 tablet 3    Sig: Take 0.5 tablets (12.5 mg total) by mouth daily.     Cardiovascular: Diuretics - Aldosterone Antagonist Failed - 06/08/2022 12:51 PM      Failed - Cr in normal range and within 180 days    Creatinine, Ser  Date Value Ref Range Status  03/05/2022 1.50 (H) 0.61 - 1.24 mg/dL Final         Failed - Na in normal range and within 180 days    Sodium  Date Value Ref Range Status  03/05/2022 134 (L) 135 - 145 mmol/L Final  11/28/2021 140 134 - 144 mmol/L Final         Passed - K in normal range and within 180 days    Potassium  Date Value Ref Range Status  03/05/2022 4.5 3.5 - 5.1 mmol/L Final    Comment:    HEMOLYSIS AT THIS LEVEL MAY AFFECT RESULT         Passed - eGFR is 30 or above and within 180 days    GFR, Estimated  Date Value Ref Range Status  03/05/2022 51 (L) >60 mL/min Final    Comment:    (NOTE) Calculated using the CKD-EPI Creatinine Equation (2021)    eGFR  Date Value Ref Range Status  11/28/2021 45 (L) >59 mL/min/1.73 Final         Passed - Last BP in normal range    BP Readings from Last 1 Encounters:  05/15/22 131/71         Passed - Valid encounter within last 6 months    Recent Outpatient Visits           3 weeks ago Annual physical exam   [facility], Amy J, NP   6 months ago Type 2 diabetes mellitus with hyperosmolarity without coma, without long-term current use of insulin (HCC)   [facility], Amy J, NP   10 months ago Screening for STD (sexually transmitted disease)   [facility], Amy J, NP   1 year ago Type 2 diabetes mellitus with hyperosmolarity without coma, without long-term current use of insulin [facility])   [facility] [place], [place] L, RPH-CPP   1 year ago Type 2 diabetes mellitus with hyperosmolarity without coma, without long-term current use of insulin [facility])   [facility] Gamaliel, Cornelius Moras, RPH-CPP       Future Appointments             In 1 month Shari Prows, Kathlynn Grate, MD [facility]   In 1 month Shamleffer, Konrad Dolores, MD [facility] Endocrinology

## 2022-06-09 NOTE — Telephone Encounter (Signed)
Spironolactone is not being managed by A.Zonia Kief, FNP, it is being managed by Laurance Flatten, MD w/[facility]

## 2022-06-09 NOTE — Telephone Encounter (Signed)
Refused 06/05/22 by PCP, managed by cardiology. Requested Prescriptions  Refused Prescriptions Disp Refills   spironolactone (ALDACTONE) 25 MG tablet 45 tablet 2    Sig: Take 1/2 tablet (12.5 mg total) by mouth daily.     There is no refill protocol information for this order

## 2022-06-10 ENCOUNTER — Other Ambulatory Visit (HOSPITAL_COMMUNITY): Payer: Self-pay

## 2022-06-10 ENCOUNTER — Telehealth: Payer: Self-pay | Admitting: Cardiology

## 2022-06-10 ENCOUNTER — Other Ambulatory Visit: Payer: Self-pay | Admitting: Family

## 2022-06-10 MED ORDER — SPIRONOLACTONE 25 MG PO TABS
12.5000 mg | ORAL_TABLET | Freq: Every day | ORAL | 2 refills | Status: DC
  Filled 2022-06-10: qty 45, 90d supply, fill #0
  Filled 2022-09-02: qty 45, 90d supply, fill #1

## 2022-06-10 NOTE — Telephone Encounter (Signed)
Toni Amend, Technician at [facility] called and says the patient said he called the office on 06/05/22 requesting the refill of spironolactone and he's getting anxious about not receiving it yet. I advised it was documented refused on 06/05/22 because it was last refilled by cardiologist Dr. Laurance Flatten. Milinda Pointer to call the cardiologist office to request the refill. She says she will call.

## 2022-06-10 NOTE — Telephone Encounter (Signed)
Pt's medication was sent to pt's pharmacy as requested. Confirmation received.  °

## 2022-06-10 NOTE — Telephone Encounter (Signed)
Courtney, Technician at [facility] called and says the patient said he called the office on 06/05/22 requesting the refill of spironolactone and he's getting anxious about not receiving it yet. I advised it was documented refused on 06/05/22 because it was last refilled by cardiologist Dr. Heather Pemberton. Advised Courtney to call the cardiologist office to request the refill. She says she will call.   

## 2022-06-10 NOTE — Telephone Encounter (Signed)
*  STAT* If patient is at the pharmacy, call can be transferred to refill team.   1. Which medications need to be refilled? (please list name of each medication and dose if known) spironolactone (ALDACTONE) 25 MG tablet   2. Which pharmacy/location (including street and city if local pharmacy) is medication to be sent to? [facility]   3. Do they need a 30 day or 90 day supply? 90

## 2022-06-11 ENCOUNTER — Other Ambulatory Visit (HOSPITAL_COMMUNITY): Payer: Self-pay

## 2022-06-12 ENCOUNTER — Other Ambulatory Visit (HOSPITAL_COMMUNITY): Payer: Self-pay

## 2022-06-22 ENCOUNTER — Other Ambulatory Visit (HOSPITAL_COMMUNITY): Payer: Self-pay

## 2022-06-23 ENCOUNTER — Other Ambulatory Visit (HOSPITAL_COMMUNITY): Payer: Self-pay

## 2022-06-23 ENCOUNTER — Ambulatory Visit (INDEPENDENT_AMBULATORY_CARE_PROVIDER_SITE_OTHER): Payer: 59 | Admitting: Physician Assistant

## 2022-06-23 ENCOUNTER — Other Ambulatory Visit (INDEPENDENT_AMBULATORY_CARE_PROVIDER_SITE_OTHER): Payer: 59

## 2022-06-23 DIAGNOSIS — E08621 Diabetes mellitus due to underlying condition with foot ulcer: Secondary | ICD-10-CM | POA: Diagnosis not present

## 2022-06-23 DIAGNOSIS — L97401 Non-pressure chronic ulcer of unspecified heel and midfoot limited to breakdown of skin: Secondary | ICD-10-CM

## 2022-06-23 DIAGNOSIS — S90852A Superficial foreign body, left foot, initial encounter: Secondary | ICD-10-CM | POA: Diagnosis not present

## 2022-06-23 MED ORDER — DOXYCYCLINE HYCLATE 100 MG PO TABS
100.0000 mg | ORAL_TABLET | Freq: Two times a day (BID) | ORAL | 0 refills | Status: DC
  Filled 2022-06-23: qty 30, 15d supply, fill #0

## 2022-06-23 NOTE — Progress Notes (Addendum)
Office Visit Note   Patient: Curtis Watkins           Date of Birth: 1955-12-03           MRN: [id number] Visit Date: 06/23/2022              Requested by: Rema Fendt, NP [address] [place],  [place] [address] PCP: Rema Fendt, NP  No chief complaint on file.     HPI: Patient is a regular patient of Dr. Audrie Lia.  He has been seen in the past before for both of his feet and has had amputations on the right foot.  He was most recently seen by Dr. Lajoyce Corners few weeks ago for debridement of some forefoot ulcers.  He states that when he got up Friday he started having pain in the bottom of his foot in the area of the heel.  Denies any fever or chills.  He does not recall stepping on anything.  He denies any drainage.  He rates the palliating as moderate plus when he is ambulating.  Assessment & Plan: Visit Diagnoses:  1. 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 Foreign body in left foot, initial encounter     Plan: Patient has foreign body in the left foot.  He does have a small puncture wound.  He has no surrounding cellulitis or fluctuance.  I did remove the eschar and it was a pinpoint entry wound.  Could only express with deep palpation a very small amount of purulent fluid.  Is no cellulitis.  Will go forward and place him on an antibiotic.  He has been given very strict return precautions.  If he has any increasing fluctuance or pain or redness or has any malaise he is to call here immediately.  He should try to remain nonweightbearing in this area he says he has crutches at home.  He will return and see Dr. Lajoyce Corners on Monday.  I told the patient that may be able to do this as an office procedure however if Dr. Lajoyce Corners feels appropriate would schedule him for surgery of excision of his foreign body in his left foot on Wednesday.  Also has asked that he use Dial soap and  water daily to cleanse the wound and keep it covered with a clean sock.  Patient acknowledges that he has had a tetanus shot within the last 10 years  Follow-Up Instructions: Return With Dr. Lajoyce Corners on Monday sooner if symptoms progress.   Ortho Exam  Patient is alert, oriented, no adenopathy, well-dressed, normal affect, normal respiratory effort. Examination of his left foot.  He has an easily palpable dorsalis pedis pulse.  He has no ascending cellulitis.  He has ulcers of his metatarsal heads and no evidence of any infection.  These have been chronic.  On the dorsal lateral aspect of his heel pad he does have a very small eschar.  There is no surrounding erythema no drainage no fluctuance.  Sensation is at his baseline.  Eschar was removed and a small pinpoint puncture wound was evident.  With aspiration this produced only a scant amount of purulent fluid which had no odor.  Patient appears well   Imaging: XR Foot Complete Left  Result Date: 06/23/2022 Radiographs of his left foot were obtained today.  Well-maintained alignment.  He has a noted foreign body in the posterior lateral aspect of  his foot and the heel.  Appears to go into the fat pad no other acute injuries noted  No images are attached to the encounter.  Labs: Lab Results  Component Value Date   HGBA1C 6.9 (A) 11/28/2021   HGBA1C 6.7 (A) 02/07/2021   HGBA1C 6.7 (A) 11/01/2020     Lab Results  Component Value Date   ALBUMIN 4.0 08/02/2020    Lab Results  Component Value Date   MG 2.6 (H) 04/04/2020   MG 2.6 (H) 04/04/2020   MG 2.9 (H) 04/03/2020   No results found for: "VD25OH"  No results found for: "PREALBUMIN"    Latest Ref Rng & Units 03/06/2022    4:34 AM 03/05/2022    8:21 PM 10/22/2021    8:24 AM  CBC EXTENDED  WBC 4.0 - 10.5 K/uL 11.3  10.6  8.0   RBC 4.22 - 5.81 MIL/uL 4.32  3.80  3.40   Hemoglobin 13.0 - 17.0 g/dL 96.0  45.4  09.8   HCT 39.0 - 52.0 % 38.9  34.2  32.3   Platelets 150 - 400 K/uL 118   94  307      There is no height or weight on file to calculate BMI.  Orders:  Orders Placed This Encounter  Procedures   XR Foot Complete Left   No orders of the defined types were placed in this encounter.    Procedures: No procedures performed  Clinical Data: No additional findings.  ROS:  All other systems negative, except as noted in the HPI. Review of Systems  Objective: Vital Signs: There were no vitals taken for this visit.  Specialty Comments:  No specialty comments available.  PMFS History: Patient Active Problem List   Diagnosis Date Noted   Foreign body in left foot 06/23/2022   Influenza 03/06/2022   Elevated troponin 03/06/2022   Generalized weakness 03/05/2022   Trichomonas infection 07/31/2021   Erectile dysfunction 03/03/2021   HFrEF (heart failure with reduced ejection fraction) (HCC) 08/21/2020   Acute on chronic systolic and diastolic heart failure, NYHA class 3 (HCC) 05/25/2020   Pleural effusion    Gangrene of toe of right foot (HCC)    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   Coronary artery disease involving native coronary artery of native heart with unstable angina pectoris (HCC)    Acute congestive heart failure (HCC) 03/27/2020   Diabetic foot ulcers (HCC) 03/27/2020   DM2 (diabetes mellitus, type 2) (HCC) 03/27/2020   HTN (hypertension) 03/27/2020   Past Medical History:  Diagnosis Date   Blind right eye    Carotid artery disease (HCC)    04/01/20: 60-79% RICA stenosis, 1-39% LICA stenosis   Chronic kidney disease    ckd stage 3 a per dr Ronald Lobo 02-05-2021 epic   Coronary artery disease    NSTEMI s/p CABG in 03/2020 (LIMA-->LAD/diag, L radial-->OM1/OM3); b.   DM type 2    Glaucoma of right eye, unspecified glaucoma type    HFrEF (heart failure with reduced ejection fraction) (HCC)    Echo 6/22: EF 45, global HK, mild MR, RVSP 31.9   Hypertension    Ischemic cardiomyopathy    PAD (peripheral artery disease) (HCC)    occluded right  axillary artery 03/29/20     Family History  Problem Relation Age of Onset   Diabetes Mother     Past Surgical History:  Procedure Laterality Date   AMPUTATION Right 04/24/2020   Procedure: RIGHT 2ND AND 3RD TOE AMPUTATION;  Surgeon:  Nadara Mustard, MD;  Location: [facility] OR;  Service: Orthopedics;  Laterality: Right;   AMPUTATION Right 10/22/2021   Procedure: RIGHT TRANSMETATARSAL AMPUTATION;  Surgeon: Nadara Mustard, MD;  [facility];  Service: Orthopedics;  Laterality: Right;   APPLICATION OF WOUND VAC Right 10/22/2021   Procedure: APPLICATION OF WOUND VAC;  Surgeon: Nadara Mustard, MD;  [facility];  Service: Orthopedics;  Laterality: Right;   CENTRAL VENOUS CATHETER INSERTION Left 04/03/2020   Procedure: INSERTION OF FEMORAL ARTERIAL LINE;  Surgeon: Linden Dolin, MD;  [facility];  Service: Open Heart Surgery;  Laterality: Left;   CORONARY ARTERY BYPASS GRAFT N/A 04/03/2020   Procedure: CORONARY ARTERY BYPASS GRAFTING (CABG) TIMES FOUR USING LEFT INTERNAL MAMMARY ARTERY AND LEFT RADIAL ARTERY.;  Surgeon: Linden Dolin, MD;  [facility];  Service: Open Heart Surgery;  Laterality: N/A;   CORONARY PRESSURE/FFR WITH 3D MAPPING N/A 03/29/2020   Procedure: Coronary Pressure Wire/FFR w/3D Mapping;  Surgeon: Lyn Records, MD;  [facility] INVASIVE CV LAB;  Service: Cardiovascular;  Laterality: N/A;   EYE SURGERY Right 01/21/2021   right eye cataract lens replacement surgery   IR THORACENTESIS ASP PLEURAL SPACE W/IMG GUIDE  05/03/2020   IR THORACENTESIS ASP PLEURAL SPACE W/IMG GUIDE  05/16/2020   LEFT HEART CATH AND CORONARY ANGIOGRAPHY N/A 03/29/2020   Procedure: LEFT HEART CATH AND CORONARY ANGIOGRAPHY;  Surgeon: Lyn Records, MD;  [facility];  Service: Cardiovascular;  Laterality: N/A;   PENILE PROSTHESIS IMPLANT N/A 03/03/2021   Procedure: PENILE PROTHESIS INFLATABLE COLOPLAST;  Surgeon: Crista Elliot, MD;  [facility];   Service: Urology;  Laterality: N/A;  REQUESTING 2 HRS   RADIAL ARTERY HARVEST Left 04/03/2020   Procedure: LEFT RADIAL ARTERY HARVEST;  Surgeon: Linden Dolin, MD;  [facility];  Service: Open Heart Surgery;  Laterality: Left;   TEE WITHOUT CARDIOVERSION N/A 04/03/2020   Procedure: TRANSESOPHAGEAL ECHOCARDIOGRAM (TEE);  Surgeon: Linden Dolin, MD;  Location: [facility] OR;  Service: Open Heart Surgery;  Laterality: N/A;   UPPER EXTREMITY ANGIOGRAPHY  03/29/2020   Procedure: Upper Extremity Angiography;  Surgeon: Lyn Records, MD;  Location: [facility] INVASIVE CV LAB;  Service: Cardiovascular;;  rt.  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

## 2022-06-24 ENCOUNTER — Other Ambulatory Visit (HOSPITAL_COMMUNITY): Payer: Self-pay

## 2022-06-29 ENCOUNTER — Encounter: Payer: Self-pay | Admitting: Orthopedic Surgery

## 2022-06-29 ENCOUNTER — Ambulatory Visit (INDEPENDENT_AMBULATORY_CARE_PROVIDER_SITE_OTHER): Payer: 59 | Admitting: Orthopedic Surgery

## 2022-06-29 VITALS — Ht 70.0 in | Wt 160.0 lb

## 2022-06-29 DIAGNOSIS — L97521 Non-pressure chronic ulcer of other part of left foot limited to breakdown of skin: Secondary | ICD-10-CM

## 2022-06-29 DIAGNOSIS — S90852A Superficial foreign body, left foot, initial encounter: Secondary | ICD-10-CM | POA: Diagnosis not present

## 2022-06-29 NOTE — Progress Notes (Signed)
Office Visit Note   Patient: Curtis Watkins           Date of Birth: [birth year]           MRN: [id number] Visit Date: 06/29/2022              Requested by: Rema Fendt, NP [address] [place],  [place] [address] PCP: Rema Fendt, NP  Chief Complaint  Patient presents with   Left Foot - Pain      HPI: Patient is a 67 year old gentleman who is seen for evaluation for abscess ulceration left heel.  Patient was seen in the office on April 30 radiographs shows a retained foreign body and patient had a draining abscess.  He was started on doxycycline.  Assessment & Plan: Visit Diagnoses:  1. Foreign body in left foot, initial encounter   2. Ulcer of left foot, limited to breakdown of skin (HCC)     Plan: Will plan for surgical intervention for debridement of the abscess and ulcer and removal of the foreign body.  Anticipate we could discharge the patient on oral antibiotics.  Follow-Up Instructions: No follow-ups on file.   Ortho Exam  Patient is alert, oriented, no adenopathy, well-dressed, normal affect, normal respiratory effort. Examination patient has a lateral heel ulcer with necrotic tissue in the base of the wound.  After informed consent this was debrided with a 10 blade knife there was a deep open wound that is 2 cm deep I could not palpate the foreign body.  Necrotic tissue was debrided.  The penetrating wound on the plantar aspect of the foot was also opened with a 10 blade knife to allow for abscess to drain.  Review the radiographs shows a metallic foreign body in the left heel consistent with the area of the abscess and drainage.  Imaging: No results found. No images are attached to the encounter.  Labs: Lab Results  Component Value Date   HGBA1C 6.9 (A) 11/28/2021   HGBA1C 6.7 (A) 02/07/2021   HGBA1C 6.7 (A) 11/01/2020     Lab Results  Component Value Date   ALBUMIN 4.0 08/02/2020    Lab Results  Component Value Date   MG 2.6 (H)  04/04/2020   MG 2.6 (H) 04/04/2020   MG 2.9 (H) 04/03/2020   No results found for: "VD25OH"  No results found for: "PREALBUMIN"    Latest Ref Rng & Units 03/06/2022    4:34 AM 03/05/2022    8:21 PM 10/22/2021    8:24 AM  CBC EXTENDED  WBC 4.0 - 10.5 K/uL 11.3  10.6  8.0   RBC 4.22 - 5.81 MIL/uL 4.32  3.80  3.40   Hemoglobin 13.0 - 17.0 g/dL 62.1  30.8  65.7   HCT 39.0 - 52.0 % 38.9  34.2  32.3   Platelets 150 - 400 K/uL 118  94  307      Body mass index is 22.96 kg/m.  Orders:  No orders of the defined types were placed in this encounter.  No orders of the defined types were placed in this encounter.    Procedures: No procedures performed  Clinical Data: No additional findings.  ROS:  All other systems negative, except as noted in the HPI. Review of Systems  Objective: Vital Signs: Ht 5\' 10"  (1.778 m)   Wt 160 lb (72.6 kg)   BMI 22.96 kg/m   Specialty Comments:  No specialty comments available.  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   Coronary artery disease involving native coronary artery of native heart with unstable angina pectoris (HCC)    Acute congestive heart failure (HCC) 03/27/2020   Diabetic foot ulcers (HCC) 03/27/2020   DM2 (diabetes mellitus, type 2) (HCC) 03/27/2020   HTN (hypertension) 03/27/2020   Past Medical History:  Diagnosis Date   Blind right eye    Carotid artery disease (HCC)    04/01/20: 60-79% RICA stenosis, 1-39% LICA stenosis   Chronic kidney disease    ckd  stage 3 a per dr Ronald Lobo 02-05-2021 epic   Coronary artery disease    NSTEMI s/p CABG in 03/2020 (LIMA-->LAD/diag, L radial-->OM1/OM3); b.   DM type 2    Glaucoma of right eye, unspecified glaucoma type    HFrEF (heart failure with reduced ejection fraction) (HCC)    Echo 6/22: EF 45, global HK, mild MR, RVSP 31.9   Hypertension    Ischemic cardiomyopathy    PAD (peripheral artery disease) (HCC)    occluded right axillary artery 03/29/20     Family History  Problem Relation Age of Onset   Diabetes Mother     Past Surgical History:  Procedure Laterality Date   AMPUTATION Right 04/24/2020   Procedure: RIGHT 2ND AND 3RD TOE AMPUTATION;  Surgeon: Nadara Mustard, MD;  Location: [facility] OR;  Service: Orthopedics;  Laterality: Right;   AMPUTATION Right 10/22/2021   Procedure: RIGHT TRANSMETATARSAL AMPUTATION;  Surgeon: Nadara Mustard, MD;  Location: [facility] OR;  Service: Orthopedics;  Laterality: Right;   APPLICATION OF WOUND VAC Right 10/22/2021   Procedure: APPLICATION OF WOUND VAC;  Surgeon: Nadara Mustard, MD;  Location: [facility] OR;  Service: Orthopedics;  Laterality: Right;   CENTRAL VENOUS CATHETER INSERTION Left 04/03/2020   Procedure: INSERTION OF FEMORAL ARTERIAL LINE;  Surgeon: Linden Dolin, MD;  Location: [facility] OR;  Service: Open Heart Surgery;  Laterality: Left;   CORONARY ARTERY BYPASS GRAFT N/A 04/03/2020   Procedure: CORONARY ARTERY BYPASS GRAFTING (CABG) TIMES FOUR USING LEFT INTERNAL MAMMARY ARTERY AND LEFT RADIAL ARTERY.;  Surgeon: Linden Dolin, MD;  Location: [facility] OR;  Service: Open Heart Surgery;  Laterality: N/A;   CORONARY PRESSURE/FFR WITH 3D MAPPING N/A 03/29/2020   Procedure: Coronary Pressure Wire/FFR w/3D Mapping;  Surgeon: Lyn Records, MD;  [facility]: [facility] INVASIVE CV LAB;  Service: Cardiovascular;  Laterality: N/A;   EYE SURGERY Right 01/21/2021   right eye cataract lens replacement surgery   IR THORACENTESIS ASP PLEURAL SPACE W/IMG GUIDE  05/03/2020   IR THORACENTESIS  ASP PLEURAL SPACE W/IMG GUIDE  05/16/2020   LEFT HEART CATH AND CORONARY ANGIOGRAPHY N/A 03/29/2020   Procedure: LEFT HEART CATH AND CORONARY ANGIOGRAPHY;  Surgeon: Lyn Records, MD;  [facility]: [facility] INVAS[facility] CV LAB;  Service: Cardiovascular;  Laterality: N/A;   PENILE PROSTHESIS IMPLANT N/A 03/03/2021   Procedure: PENILE PROTHESIS INFLATABLE COLOPLAST;  Surgeon: Crista Elliot, MD;  Location: [facility];  Service: Urology;  Laterality: N/A;  REQUESTING 2 HRS   RADIAL ARTERY HARVEST Left 04/03/2020   Procedure: LEFT RADIAL ARTERY HARVEST;  Surgeon: Linden Dolin, MD;  Location: [facility];  Service: Open Heart Surgery;  Laterality: Left;   TEE WITHOUT CARDIOVERSION N/A 04/03/2020   Procedure: TRANSESOPHAGEAL ECHOCARDIOGRAM (TEE);  Surgeon: Linden Dolin, MD;  Location: [facility] OR;  Service: Open Heart Surgery;  Laterality: N/A;   UPPER EXTREMITY ANGIOGRAPHY  03/29/2020   Procedure: Upper Extremity Angiography;  Surgeon: Lyn Records, MD;  Location: [facility] INVASIVE CV LAB;  Service: Cardiovascular;;  rt.  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

## 2022-06-30 ENCOUNTER — Other Ambulatory Visit: Payer: Self-pay

## 2022-06-30 ENCOUNTER — Encounter (HOSPITAL_COMMUNITY): Payer: Self-pay | Admitting: Orthopedic Surgery

## 2022-06-30 NOTE — Progress Notes (Signed)
Spoke with pt for pre-op call. Pt has hx of CAD with CABG done on February 2022. He denies any recent chest pain or shortness of breath. Pt is diabetic. Last A1C was 6.9 on 11/28/21. He states his fasting blood sugar is usually around 130. Pt is on Jardiance, he  has already taken it today. I instructed him not to take it in the AM. Instructed him to check his blood sugar in the AM when he wakes up. If blood sugar is 70 or below, treat with 1/2 cup of clear juice (apple or cranberry) and recheck blood sugar 15 minutes after drinking juice. If blood sugar continues to be 70 or below, call the Short Stay department and ask to speak to a nurse.  Shower instructions given to pt.

## 2022-06-30 NOTE — H&P (Signed)
Curtis Watkins is an 67 y.o. male.   Chief Complaint: Purulent draining abscess left heel HPI: Patient is a 67 year old gentleman who is seen for evaluation for abscess ulceration left heel. Patient was seen in the office on April 30 radiographs shows a retained foreign body and patient had a draining abscess. He was started on doxycycline.   Past Medical History:  Diagnosis Date   Blind right eye    Carotid artery disease (HCC)    04/01/20: 60-79% RICA stenosis, 1-39% LICA stenosis   Chronic kidney disease    ckd stage 3 a per dr Ronald Lobo 02-05-2021 epic   Coronary artery disease    NSTEMI s/p CABG in 03/2020 (LIMA-->LAD/diag, L radial-->OM1/OM3); b.   DM type 2    Glaucoma of right eye, unspecified glaucoma type    HFrEF (heart failure with reduced ejection fraction) (HCC)    Echo 6/22: EF 45, global HK, mild MR, RVSP 31.9   Hypertension    Ischemic cardiomyopathy    PAD (peripheral artery disease) (HCC)    occluded right axillary artery 03/29/20     Past Surgical History:  Procedure Laterality Date   AMPUTATION Right 04/24/2020   Procedure: RIGHT 2ND AND 3RD TOE AMPUTATION;  Surgeon: Nadara Mustard, MD;  Location: [facility] OR;  Service: Orthopedics;  Laterality: Right;   AMPUTATION Right 10/22/2021   Procedure: RIGHT TRANSMETATARSAL AMPUTATION;  Surgeon: Nadara Mustard, MD;  Location: [facility] OR;  Service: Orthopedics;  Laterality: Right;   APPLICATION OF WOUND VAC Right 10/22/2021   Procedure: APPLICATION OF WOUND VAC;  Surgeon: Nadara Mustard, MD;  Location: [facility] OR;  Service: Orthopedics;  Laterality: Right;   CENTRAL VENOUS CATHETER INSERTION Left 04/03/2020   Procedure: INSERTION OF FEMORAL ARTERIAL LINE;  Surgeon: Linden Dolin, MD;  Location: [facility] OR;  Service: Open Heart Surgery;  Laterality: Left;   CORONARY ARTERY BYPASS GRAFT N/A 04/03/2020   Procedure: CORONARY ARTERY BYPASS GRAFTING (CABG) TIMES FOUR USING LEFT INTERNAL MAMMARY ARTERY AND LEFT RADIAL ARTERY.;  Surgeon: Linden Dolin, MD;  Location: [facility] OR;  Service: Open Heart Surgery;  Laterality: N/A;   CORONARY PRESSURE/FFR WITH 3D MAPPING N/A 03/29/2020   Procedure: Coronary Pressure Wire/FFR w/3D Mapping;  Surgeon: Lyn Records, MD;  [facility]: [facility] INVASIVE CV LAB;  Service: Cardiovascular;  Laterality: N/A;   EYE SURGERY Right 01/21/2021   right eye cataract lens replacement surgery   IR THORACENTESIS ASP PLEURAL SPACE W/IMG GUIDE  05/03/2020   IR THORACENTESIS ASP PLEURAL SPACE W/IMG GUIDE  05/16/2020   LEFT HEART CATH AND CORONARY ANGIOGRAPHY N/A 03/29/2020   Procedure: LEFT HEART CATH AND CORONARY ANGIOGRAPHY;  Surgeon: Lyn Records, MD;  [facility]: [facility] INVASIVE CV LAB;  Service: Cardiovascular;  Laterality: N/A;   PENILE PROSTHESIS IMPLANT N/A 03/03/2021   Procedure: PENILE PROTHESIS INFLATABLE COLOPLAST;  Surgeon: Crista Elliot, MD;  Location: [facility];  Service: Urology;  Laterality: N/A;  REQUESTING 2 HRS   RADIAL ARTERY HARVEST Left 04/03/2020   Procedure: LEFT RADIAL ARTERY HARVEST;  Surgeon: Linden Dolin, MD;  Location: [facility] OR;  Service: Open Heart Surgery;  Laterality: Left;   TEE WITHOUT CARDIOVERSION N/A 04/03/2020   Procedure: TRANSESOPHAGEAL ECHOCARDIOGRAM (TEE);  Surgeon: Linden Dolin, MD;  Location: [facility] OR;  Service: Open Heart Surgery;  Laterality: N/A;   UPPER EXTREMITY ANGIOGRAPHY  03/29/2020   Procedure: Upper Extremity Angiography;  Surgeon: Lyn Records, MD;  Location: [facility] INVASIVE CV LAB;  Service: Cardiovascular;;  rt.  arm    Family History  Problem Relation Age of Onset   Diabetes Mother    Social History:  reports that he has been smoking cigarettes. He has a 0.50 pack-year smoking history. He has been exposed to tobacco smoke. He has never used smokeless tobacco. He reports that he does not currently use alcohol after a past usage of about 1.0 standard drink of alcohol per week. He reports that he does not currently use drugs after having used the  following drugs: Marijuana.  Allergies: No Known Allergies  No medications prior to admission.    No results found for this or any previous visit (from the past 48 hour(s)). No results found.  Review of Systems  All other systems reviewed and are negative.   There were no vitals taken for this visit. Physical Exam  Patient is alert, oriented, no adenopathy, well-dressed, normal affect, normal respiratory effort. Examination patient has a lateral heel ulcer with necrotic tissue in the base of the wound.  After informed consent this was debrided with a 10 blade knife there was a deep open wound that is 2 cm deep I could not palpate the foreign body.  Necrotic tissue was debrided.  The penetrating wound on the plantar aspect of the foot was also opened with a 10 blade knife to allow for abscess to drain.  Review the radiographs shows a metallic foreign body in the left heel consistent with the area of the abscess and drainage. Assessment/Plan 1. Foreign body in left foot, initial encounter   2. Ulcer of left foot, limited to breakdown of skin (HCC)       Plan: Will plan for surgical intervention for debridement of the abscess and ulcer and removal of the foreign body.  Anticipate we could discharge the patient on oral antibiotics.  Nadara Mustard, MD 06/30/2022, 5:03 PM

## 2022-06-30 NOTE — Progress Notes (Signed)
patient voiced understanding of new arrival time of 0645 tomorrow

## 2022-07-01 ENCOUNTER — Ambulatory Visit (HOSPITAL_COMMUNITY): Payer: 59 | Admitting: Anesthesiology

## 2022-07-01 ENCOUNTER — Encounter (HOSPITAL_COMMUNITY): Payer: Self-pay | Admitting: Orthopedic Surgery

## 2022-07-01 ENCOUNTER — Other Ambulatory Visit: Payer: Self-pay

## 2022-07-01 ENCOUNTER — Ambulatory Visit (HOSPITAL_COMMUNITY)
Admission: RE | Admit: 2022-07-01 | Discharge: 2022-07-01 | Disposition: A | Discharge status: Home / Self Care | Payer: 59 | Attending: Orthopedic Surgery | Admitting: Orthopedic Surgery

## 2022-07-01 ENCOUNTER — Other Ambulatory Visit (HOSPITAL_COMMUNITY): Payer: Self-pay

## 2022-07-01 ENCOUNTER — Ambulatory Visit (HOSPITAL_BASED_OUTPATIENT_CLINIC_OR_DEPARTMENT_OTHER): Payer: 59 | Admitting: Anesthesiology

## 2022-07-01 ENCOUNTER — Encounter (HOSPITAL_COMMUNITY)
Admission: RE | Disposition: A | Discharge status: Home / Self Care | Payer: Self-pay | Source: Home / Self Care | Attending: Orthopedic Surgery

## 2022-07-01 DIAGNOSIS — L02612 Cutaneous abscess of left foot: Secondary | ICD-10-CM | POA: Diagnosis not present

## 2022-07-01 DIAGNOSIS — S90852D Superficial foreign body, left foot, subsequent encounter: Secondary | ICD-10-CM

## 2022-07-01 DIAGNOSIS — I110 Hypertensive heart disease with heart failure: Secondary | ICD-10-CM | POA: Diagnosis not present

## 2022-07-01 DIAGNOSIS — X58XXXA Exposure to other specified factors, initial encounter: Secondary | ICD-10-CM | POA: Insufficient documentation

## 2022-07-01 DIAGNOSIS — I509 Heart failure, unspecified: Secondary | ICD-10-CM

## 2022-07-01 DIAGNOSIS — I130 Hypertensive heart and chronic kidney disease with heart failure and stage 1 through stage 4 chronic kidney disease, or unspecified chronic kidney disease: Secondary | ICD-10-CM | POA: Insufficient documentation

## 2022-07-01 DIAGNOSIS — E1122 Type 2 diabetes mellitus with diabetic chronic kidney disease: Secondary | ICD-10-CM | POA: Diagnosis not present

## 2022-07-01 DIAGNOSIS — L97521 Non-pressure chronic ulcer of other part of left foot limited to breakdown of skin: Secondary | ICD-10-CM | POA: Diagnosis not present

## 2022-07-01 DIAGNOSIS — S90852A Superficial foreign body, left foot, initial encounter: Secondary | ICD-10-CM | POA: Diagnosis not present

## 2022-07-01 DIAGNOSIS — I2510 Atherosclerotic heart disease of native coronary artery without angina pectoris: Secondary | ICD-10-CM | POA: Diagnosis not present

## 2022-07-01 DIAGNOSIS — E11621 Type 2 diabetes mellitus with foot ulcer: Secondary | ICD-10-CM | POA: Insufficient documentation

## 2022-07-01 DIAGNOSIS — F17200 Nicotine dependence, unspecified, uncomplicated: Secondary | ICD-10-CM

## 2022-07-01 DIAGNOSIS — E1151 Type 2 diabetes mellitus with diabetic peripheral angiopathy without gangrene: Secondary | ICD-10-CM | POA: Diagnosis not present

## 2022-07-01 DIAGNOSIS — I5022 Chronic systolic (congestive) heart failure: Secondary | ICD-10-CM | POA: Insufficient documentation

## 2022-07-01 DIAGNOSIS — F17210 Nicotine dependence, cigarettes, uncomplicated: Secondary | ICD-10-CM | POA: Diagnosis not present

## 2022-07-01 DIAGNOSIS — E119 Type 2 diabetes mellitus without complications: Secondary | ICD-10-CM | POA: Diagnosis not present

## 2022-07-01 DIAGNOSIS — N1831 Chronic kidney disease, stage 3a: Secondary | ICD-10-CM | POA: Insufficient documentation

## 2022-07-01 DIAGNOSIS — Z951 Presence of aortocoronary bypass graft: Secondary | ICD-10-CM | POA: Diagnosis not present

## 2022-07-01 HISTORY — PX: I & D EXTREMITY: SHX5045

## 2022-07-01 LAB — CBC
HCT: 38.3 % — ABNORMAL LOW (ref 39.0–52.0)
Hemoglobin: 13 g/dL (ref 13.0–17.0)
MCH: 32.3 pg (ref 26.0–34.0)
MCHC: 33.9 g/dL (ref 30.0–36.0)
MCV: 95.3 fL (ref 80.0–100.0)
Platelets: 311 10*3/uL (ref 150–400)
RBC: 4.02 MIL/uL — ABNORMAL LOW (ref 4.22–5.81)
RDW: 13.1 % (ref 11.5–15.5)
WBC: 9.1 10*3/uL (ref 4.0–10.5)
nRBC: 0 % (ref 0.0–0.2)

## 2022-07-01 LAB — BASIC METABOLIC PANEL
Anion gap: 11 (ref 5–15)
BUN: 31 mg/dL — ABNORMAL HIGH (ref 8–23)
CO2: 16 mmol/L — ABNORMAL LOW (ref 22–32)
Calcium: 8.5 mg/dL — ABNORMAL LOW (ref 8.9–10.3)
Chloride: 106 mmol/L (ref 98–111)
Creatinine, Ser: 1.71 mg/dL — ABNORMAL HIGH (ref 0.61–1.24)
GFR, Estimated: 44 mL/min — ABNORMAL LOW (ref >60)
Glucose, Bld: 135 mg/dL — ABNORMAL HIGH (ref 70–99)
Potassium: 4.8 mmol/L (ref 3.5–5.1)
Sodium: 133 mmol/L — ABNORMAL LOW (ref 135–145)

## 2022-07-01 LAB — AEROBIC/ANAEROBIC CULTURE W GRAM STAIN (SURGICAL/DEEP WOUND)

## 2022-07-01 LAB — GLUCOSE, CAPILLARY
Glucose-Capillary: 123 mg/dL — ABNORMAL HIGH (ref 70–99)
Glucose-Capillary: 135 mg/dL — ABNORMAL HIGH (ref 70–99)

## 2022-07-01 SURGERY — IRRIGATION AND DEBRIDEMENT EXTREMITY
Anesthesia: Monitor Anesthesia Care | Laterality: Left

## 2022-07-01 MED ORDER — ONDANSETRON HCL 4 MG/2ML IJ SOLN
INTRAMUSCULAR | Status: DC | PRN
  Administered 2022-07-01: 4 mg via INTRAVENOUS

## 2022-07-01 MED ORDER — PROPOFOL 10 MG/ML IV BOLUS
INTRAVENOUS | Status: AC
  Filled 2022-07-01: qty 20

## 2022-07-01 MED ORDER — CHLORHEXIDINE GLUCONATE 0.12 % MT SOLN
15.0000 mL | OROMUCOSAL | Status: AC
  Administered 2022-07-01: 15 mL via OROMUCOSAL
  Filled 2022-07-01 (×2): qty 15

## 2022-07-01 MED ORDER — LIDOCAINE 2% (20 MG/ML) 5 ML SYRINGE
INTRAMUSCULAR | Status: AC
  Filled 2022-07-01: qty 5

## 2022-07-01 MED ORDER — PROPOFOL 10 MG/ML IV BOLUS
INTRAVENOUS | Status: DC | PRN
  Administered 2022-07-01: 10 mg via INTRAVENOUS
  Administered 2022-07-01: 20 mg via INTRAVENOUS
  Administered 2022-07-01 (×2): 10 mg via INTRAVENOUS
  Administered 2022-07-01 (×2): 20 mg via INTRAVENOUS
  Administered 2022-07-01 (×2): 10 mg via INTRAVENOUS

## 2022-07-01 MED ORDER — LIDOCAINE HCL (PF) 1 % IJ SOLN
INTRAMUSCULAR | Status: AC
  Filled 2022-07-01: qty 30

## 2022-07-01 MED ORDER — ACETAMINOPHEN 160 MG/5ML PO SOLN: 325.0000 mg | ORAL | Status: DC | PRN

## 2022-07-01 MED ORDER — FENTANYL CITRATE (PF) 250 MCG/5ML IJ SOLN
INTRAMUSCULAR | Status: AC
  Filled 2022-07-01: qty 5

## 2022-07-01 MED ORDER — ONDANSETRON HCL 4 MG/2ML IJ SOLN
INTRAMUSCULAR | Status: AC
  Filled 2022-07-01: qty 2

## 2022-07-01 MED ORDER — FENTANYL CITRATE (PF) 100 MCG/2ML IJ SOLN: 25.0000 ug | INTRAMUSCULAR | Status: DC | PRN

## 2022-07-01 MED ORDER — LIDOCAINE 2% (20 MG/ML) 5 ML SYRINGE
INTRAMUSCULAR | Status: DC | PRN
  Administered 2022-07-01: 20 mg via INTRAVENOUS

## 2022-07-01 MED ORDER — ACETAMINOPHEN 10 MG/ML IV SOLN: 1000.0000 mg | Freq: Once | INTRAVENOUS | Status: DC | PRN

## 2022-07-01 MED ORDER — FENTANYL CITRATE (PF) 100 MCG/2ML IJ SOLN
INTRAMUSCULAR | Status: DC | PRN
  Administered 2022-07-01 (×2): 25 ug via INTRAVENOUS

## 2022-07-01 MED ORDER — OXYCODONE HCL 5 MG PO TABS: 5.0000 mg | ORAL_TABLET | Freq: Once | ORAL | Status: DC | PRN

## 2022-07-01 MED ORDER — CEFAZOLIN SODIUM-DEXTROSE 2-4 GM/100ML-% IV SOLN
2.0000 g | INTRAVENOUS | Status: AC
  Administered 2022-07-01: 2 g via INTRAVENOUS
  Filled 2022-07-01: qty 100

## 2022-07-01 MED ORDER — OXYCODONE-ACETAMINOPHEN 5-325 MG PO TABS
1.0000 | ORAL_TABLET | ORAL | 0 refills | Status: DC | PRN
  Filled 2022-07-01: qty 30, 5d supply, fill #0

## 2022-07-01 MED ORDER — ACETAMINOPHEN 325 MG PO TABS: 325.0000 mg | ORAL_TABLET | ORAL | Status: DC | PRN

## 2022-07-01 MED ORDER — 0.9 % SODIUM CHLORIDE (POUR BTL) OPTIME
TOPICAL | Status: DC | PRN
  Administered 2022-07-01: 1000 mL

## 2022-07-01 MED ORDER — LIDOCAINE HCL (PF) 1 % IJ SOLN
INTRAMUSCULAR | Status: DC | PRN
  Administered 2022-07-01: 10 mL

## 2022-07-01 MED ORDER — INSULIN ASPART 100 UNIT/ML IJ SOLN: 0.0000 [IU] | INTRAMUSCULAR | Status: DC | PRN

## 2022-07-01 MED ORDER — PROMETHAZINE HCL 25 MG/ML IJ SOLN: 6.2500 mg | INTRAMUSCULAR | Status: DC | PRN

## 2022-07-01 MED ORDER — LACTATED RINGERS IV SOLN: INTRAVENOUS | Status: DC

## 2022-07-01 MED ORDER — OXYCODONE HCL 5 MG/5ML PO SOLN: 5.0000 mg | Freq: Once | ORAL | Status: DC | PRN

## 2022-07-01 MED ORDER — MIDAZOLAM HCL 2 MG/2ML IJ SOLN
INTRAMUSCULAR | Status: AC
  Filled 2022-07-01: qty 2

## 2022-07-01 MED ORDER — AMISULPRIDE (ANTIEMETIC) 5 MG/2ML IV SOLN: 10.0000 mg | Freq: Once | INTRAVENOUS | Status: DC | PRN

## 2022-07-01 MED ORDER — MIDAZOLAM HCL 5 MG/5ML IJ SOLN
INTRAMUSCULAR | Status: DC | PRN
  Administered 2022-07-01: 1 mg via INTRAVENOUS

## 2022-07-01 SURGICAL SUPPLY — 37 items
BAG COUNTER SPONGE SURGICOUNT (BAG) IMPLANT
BAG SPNG CNTER NS LX DISP (BAG)
BLADE SURG 21 STRL SS (BLADE) ×1 IMPLANT
BNDG CMPR 5X4 CHSV STRCH STRL (GAUZE/BANDAGES/DRESSINGS) ×1
BNDG CMPR 5X6 CHSV STRCH STRL (GAUZE/BANDAGES/DRESSINGS)
BNDG COHESIVE 4X5 TAN STRL LF (GAUZE/BANDAGES/DRESSINGS) IMPLANT
BNDG COHESIVE 6X5 TAN ST LF (GAUZE/BANDAGES/DRESSINGS) IMPLANT
BNDG GAUZE DERMACEA FLUFF 4 (GAUZE/BANDAGES/DRESSINGS) ×2 IMPLANT
BNDG GZE DERMACEA 4 6PLY (GAUZE/BANDAGES/DRESSINGS) ×2
COVER SURGICAL LIGHT HANDLE (MISCELLANEOUS) ×2 IMPLANT
DRAPE U-SHAPE 47X51 STRL (DRAPES) ×1 IMPLANT
DRSG ADAPTIC 3X8 NADH LF (GAUZE/BANDAGES/DRESSINGS) ×1 IMPLANT
DRSG MEPILEX POST OP 4X8 (GAUZE/BANDAGES/DRESSINGS) IMPLANT
DURAPREP 26ML APPLICATOR (WOUND CARE) ×1 IMPLANT
ELECT REM PT RETURN 9FT ADLT (ELECTROSURGICAL)
ELECTRODE REM PT RTRN 9FT ADLT (ELECTROSURGICAL) IMPLANT
GAUZE SPONGE 4X4 12PLY STRL (GAUZE/BANDAGES/DRESSINGS) ×1 IMPLANT
GLOVE BIOGEL PI IND STRL 9 (GLOVE) ×1 IMPLANT
GLOVE SURG ORTHO 9.0 STRL STRW (GLOVE) ×1 IMPLANT
GOWN STRL REUS W/ TWL XL LVL3 (GOWN DISPOSABLE) ×2 IMPLANT
GOWN STRL REUS W/TWL XL LVL3 (GOWN DISPOSABLE) ×2
GRAFT SKIN WND MICRO 38 (Tissue) IMPLANT
HANDPIECE INTERPULSE COAX TIP (DISPOSABLE)
KIT BASIN OR (CUSTOM PROCEDURE TRAY) ×1 IMPLANT
KIT TURNOVER KIT B (KITS) ×1 IMPLANT
MANIFOLD NEPTUNE II (INSTRUMENTS) ×1 IMPLANT
NS IRRIG 1000ML POUR BTL (IV SOLUTION) ×1 IMPLANT
PACK ORTHO EXTREMITY (CUSTOM PROCEDURE TRAY) ×1 IMPLANT
PAD ARMBOARD 7.5X6 YLW CONV (MISCELLANEOUS) ×2 IMPLANT
SET HNDPC FAN SPRY TIP SCT (DISPOSABLE) IMPLANT
STOCKINETTE IMPERVIOUS 9X36 MD (GAUZE/BANDAGES/DRESSINGS) IMPLANT
SUT ETHILON 2 0 PSLX (SUTURE) ×1 IMPLANT
SWAB COLLECTION DEVICE MRSA (MISCELLANEOUS) ×1 IMPLANT
SWAB CULTURE ESWAB REG 1ML (MISCELLANEOUS) IMPLANT
TOWEL GREEN STERILE (TOWEL DISPOSABLE) ×1 IMPLANT
TUBE CONNECTING 12X1/4 (SUCTIONS) ×1 IMPLANT
YANKAUER SUCT BULB TIP NO VENT (SUCTIONS) ×1 IMPLANT

## 2022-07-01 NOTE — Progress Notes (Signed)
Orthopedic Tech Progress Note Patient Details:  Curtis Watkins 1955/09/17 [id number]  Ortho Devices Type of Ortho Device: Postop shoe/boot Ortho Device/Splint Location: LLE Ortho Device/Splint Interventions: Ordered  PACU RN called for post op shoe. Delivered to bay 17. Post Interventions Patient Tolerated: Well  Sherilyn Banker 07/01/2022, 10:14 AM

## 2022-07-01 NOTE — Anesthesia Postprocedure Evaluation (Signed)
Anesthesia Post Note  Patient: Curtis Watkins  Procedure(s) Performed: LEFT FOOT DEBRIDEMENT AND FOREIGN BODY REMOVAL (Left)     Patient location during evaluation: PACU Anesthesia Type: MAC Level of consciousness: awake and alert Pain management: pain level controlled Vital Signs Assessment: post-procedure vital signs reviewed and stable Respiratory status: spontaneous breathing, nonlabored ventilation, respiratory function stable and patient connected to nasal cannula oxygen Cardiovascular status: stable and blood pressure returned to baseline Postop Assessment: no apparent nausea or vomiting Anesthetic complications: no  No notable events documented.  Last Vitals:  Vitals:   07/01/22 1000 07/01/22 1015  BP: (!) 180/73 (!) 172/87  Pulse: 60 62  Resp: (!) 9 11  Temp:  (!) 36.4 C  SpO2: 100% 100%    Last Pain:  Vitals:   07/01/22 1015  TempSrc:   PainSc: 0-No pain                 Shelton Silvas

## 2022-07-01 NOTE — Op Note (Signed)
07/01/2022  9:50 AM  PATIENT:  [name]    PRE-OPERATIVE DIAGNOSIS:  Abscess Left Foot and Foreign Body  POST-OPERATIVE DIAGNOSIS:  Same  PROCEDURE:  LEFT FOOT EXCISIONAL DEBRIDEMENT OF SKIN AND SOFT TISSUE FASCIA AND MUSCLE. FOREIGN BODY REMOVAL, glass. Application of Kerecis micro graft 38 cm. Application of a new wound VAC medium dressing.  SURGEON:  Nadara Mustard, MD  PHYSICIAN ASSISTANT:None ANESTHESIA:   General  PREOPERATIVE INDICATIONS:  ORVEL MACIOCE is a  68 y.o. male with a diagnosis of Abscess Left Foot and Foreign Body who failed conservative measures and elected for surgical management.    The risks benefits and alternatives were discussed with the patient preoperatively including but not limited to the risks of infection, bleeding, nerve injury, cardiopulmonary complications, the need for revision surgery, among others, and the patient was willing to proceed.  OPERATIVE IMPLANTS:   Implant Name Type Inv. Item Serial No. Manufacturer Lot No. LRB No. Used Action  GRAFT SKIN WND MICRO 38 - [id number] Tissue GRAFT SKIN WND MICRO 38  [employer] [id number] Left 1 Implanted    @ENCIMAGES @  OPERATIVE FINDINGS: Patient had a piece of retained glass that was removed.  There was a large deep abscess cavity that was debrided.  Tissue was sent for cultures.  OPERATIVE PROCEDURE: Patient brought the operating room underwent a MAC anesthetic.  After adequate levels anesthesia obtained patient's left lower extremities prepped using DuraPrep draped into a sterile field a timeout was called.  Patient underwent local infiltration with 10 cc of 1% lidocaine plain.  An elliptical incision was made around the ulcerative tissue the left a wound that was 5 x 3 cm and 2 cm deep.  A rondure and 21 blade knife were used to excise skin and soft tissue muscle and fascia.  The wound was irrigated with normal saline the tissue margins were clear there was no undermining.  The wound was 2 cm  deep.  The wound was filled with Kerecis micro graft 38 cm.  This was covered with a new wound VAC sponge this had a good suction fit patient was taken the PACU in stable condition.   DISCHARGE PLANNING:  Antibiotic duration: Continue current antibiotics will adjust according to cultures.  Weightbearing: Touchdown weightbearing on the left  Pain medication: Prescription for Percocet  Dressing care/ Wound VAC: Wound VAC  Ambulatory devices: Crutches  Discharge to: Home.  Follow-up: In the office 1 week post operative.

## 2022-07-01 NOTE — Interval H&P Note (Signed)
History and Physical Interval Note:  07/01/2022 7:17 AM  Curtis Watkins  has presented today for surgery, with the diagnosis of Abscess Left Foot and Foreign Body.  The various methods of treatment have been discussed with the patient and family. After consideration of risks, benefits and other options for treatment, the patient has consented to  Procedure(s): LEFT FOOT DEBRIDEMENT AND FOREIGN BODY REMOVAL (Left) as a surgical intervention.  The patient's history has been reviewed, patient examined, no change in status, stable for surgery.  I have reviewed the patient's chart and labs.  Questions were answered to the patient's satisfaction.     Nadara Mustard

## 2022-07-01 NOTE — Transfer of Care (Signed)
Immediate Anesthesia Transfer of Care Note  Patient: Curtis Watkins  Procedure(s) Performed: LEFT FOOT DEBRIDEMENT AND FOREIGN BODY REMOVAL (Left)  Patient Location: PACU  Anesthesia Type:MAC  Level of Consciousness: awake, alert , and oriented  Airway & Oxygen Therapy: Patient Spontanous Breathing  Post-op Assessment: Report given to RN and Post -op Vital signs reviewed and stable  Post vital signs: Reviewed and stable  Last Vitals:  Vitals Value Taken Time  BP 154/66 07/01/22 0940  Temp    Pulse 62 07/01/22 0941  Resp 11 07/01/22 0941  SpO2 100 % 07/01/22 0941  Vitals shown include unvalidated device data.  Last Pain:  Vitals:   07/01/22 0723  TempSrc:   PainSc: 0-No pain      Patients Stated Pain Goal: 0 (07/01/22 0723)  Complications: No notable events documented.

## 2022-07-01 NOTE — Anesthesia Preprocedure Evaluation (Addendum)
Anesthesia Evaluation  Patient identified by MRN, date of birth, ID band Patient awake    Reviewed: Allergy & Precautions, NPO status , Patient's Chart, lab work & pertinent test results  Airway Mallampati: I  TM Distance: >3 FB Neck ROM: Full    Dental  (+) Edentulous Lower, Edentulous Upper   Pulmonary Current Smoker   breath sounds clear to auscultation       Cardiovascular hypertension, Pt. on medications and Pt. on home beta blockers + CAD, + CABG, + Peripheral Vascular Disease and +CHF   Rhythm:Regular Rate:Normal     Neuro/Psych negative neurological ROS  negative psych ROS   GI/Hepatic negative GI ROS, Neg liver ROS,,,  Endo/Other  diabetes    Renal/GU Renal disease     Musculoskeletal   Abdominal   Peds  Hematology   Anesthesia Other Findings   Reproductive/Obstetrics                             Anesthesia Physical Anesthesia Plan  ASA: 3  Anesthesia Plan: General   Post-op Pain Management: Tylenol PO (pre-op)*   Induction: Intravenous  PONV Risk Score and Plan: 2 and Ondansetron and Midazolam  Airway Management Planned: LMA  Additional Equipment: None  Intra-op Plan:   Post-operative Plan: Extubation in OR  Informed Consent: I have reviewed the patients History and Physical, chart, labs and discussed the procedure including the risks, benefits and alternatives for the proposed anesthesia with the patient or authorized representative who has indicated his/her understanding and acceptance.       Plan Discussed with: CRNA  Anesthesia Plan Comments:        Anesthesia Quick Evaluation

## 2022-07-02 ENCOUNTER — Telehealth: Payer: Self-pay

## 2022-07-02 ENCOUNTER — Encounter: Payer: 59 | Admitting: Orthopedic Surgery

## 2022-07-02 ENCOUNTER — Encounter (HOSPITAL_COMMUNITY): Payer: Self-pay | Admitting: Orthopedic Surgery

## 2022-07-02 NOTE — Telephone Encounter (Signed)
Appt sch for 10:30 07/07/2022

## 2022-07-02 NOTE — Telephone Encounter (Signed)
Pt is sch for f/u on 07/07/22 at 2:45 will see if he can move up to the morning with Dr. Lajoyce Corners. 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

## 2022-07-03 ENCOUNTER — Telehealth: Payer: Self-pay

## 2022-07-03 ENCOUNTER — Other Ambulatory Visit (HOSPITAL_COMMUNITY): Payer: Self-pay

## 2022-07-03 ENCOUNTER — Other Ambulatory Visit: Payer: Self-pay

## 2022-07-03 LAB — AEROBIC/ANAEROBIC CULTURE W GRAM STAIN (SURGICAL/DEEP WOUND)

## 2022-07-03 MED ORDER — CIPROFLOXACIN HCL 500 MG PO TABS
500.0000 mg | ORAL_TABLET | Freq: Two times a day (BID) | ORAL | 0 refills | Status: DC
  Filled 2022-07-03: qty 42, 21d supply, fill #0

## 2022-07-03 NOTE — Telephone Encounter (Signed)
Order in chart for Cipro BID x 3 weeks. Sent to pharm per Dr. Lajoyce Corners. Called pt to advise. He will stop taking his doxy and take the cipro for 3 weeks. Has f/u in the office 07/07/22 will call with any questions.

## 2022-07-03 NOTE — Telephone Encounter (Signed)
-----   Message from Nadara Mustard, MD sent at 07/03/2022 10:54 AM EDT ----- Can you make sure that this patient is on Cipro for 3 weeks 500 mg twice a day.  Cultures have shown  bacteria that is sensitive to Cipro. ----- Message ----- From: Interface, Lab In [place] Sent: 07/01/2022   2:25 PM EDT To: Nadara Mustard, MD

## 2022-07-04 LAB — AEROBIC/ANAEROBIC CULTURE W GRAM STAIN (SURGICAL/DEEP WOUND)

## 2022-07-06 ENCOUNTER — Other Ambulatory Visit (HOSPITAL_COMMUNITY): Payer: Self-pay

## 2022-07-06 DIAGNOSIS — I129 Hypertensive chronic kidney disease with stage 1 through stage 4 chronic kidney disease, or unspecified chronic kidney disease: Secondary | ICD-10-CM | POA: Diagnosis not present

## 2022-07-06 DIAGNOSIS — D631 Anemia in chronic kidney disease: Secondary | ICD-10-CM | POA: Diagnosis not present

## 2022-07-06 DIAGNOSIS — N2581 Secondary hyperparathyroidism of renal origin: Secondary | ICD-10-CM | POA: Diagnosis not present

## 2022-07-06 DIAGNOSIS — R809 Proteinuria, unspecified: Secondary | ICD-10-CM | POA: Diagnosis not present

## 2022-07-06 DIAGNOSIS — N1831 Chronic kidney disease, stage 3a: Secondary | ICD-10-CM | POA: Diagnosis not present

## 2022-07-07 ENCOUNTER — Ambulatory Visit (INDEPENDENT_AMBULATORY_CARE_PROVIDER_SITE_OTHER): Payer: 59 | Admitting: Orthopedic Surgery

## 2022-07-07 ENCOUNTER — Encounter: Payer: Self-pay | Admitting: Orthopedic Surgery

## 2022-07-07 ENCOUNTER — Encounter: Payer: 59 | Admitting: Orthopedic Surgery

## 2022-07-07 DIAGNOSIS — L97521 Non-pressure chronic ulcer of other part of left foot limited to breakdown of skin: Secondary | ICD-10-CM

## 2022-07-07 DIAGNOSIS — S90852A Superficial foreign body, left foot, initial encounter: Secondary | ICD-10-CM

## 2022-07-07 LAB — LAB REPORT - SCANNED
Albumin, Urine POC: 54.3
Creatinine, POC: 45.6 mg/dL
EGFR: 50
Microalb Creat Ratio: 119

## 2022-07-07 NOTE — Progress Notes (Addendum)
Office Visit Note   Patient: Curtis Watkins           Date of Birth: November 14, 1955           MRN: [id number] Visit Date: 07/07/2022              Requested by: Rema Fendt, NP [address] [place],  [place] [address] PCP: Rema Fendt, NP  Chief Complaint  Patient presents with   Left Foot - Routine Post Op    07/01/2022 left foot debridement and removal foreign body NEW VAC SPONGE      HPI: Patient presents 1 week status post left foot debridement with foreign body removal..  Patient has a new wound VAC dressing and wound bed filled with Kerecis.  Assessment & Plan: Visit Diagnoses:  1. Foreign body in left foot, initial encounter   2. Ulcer of left foot, limited to breakdown of skin (HCC)     Plan: Will apply a nonadherent Adaptic layer patient will change the gauze and Ace wrap daily leave the Adaptic in place.  Follow-Up Instructions: Return in about 1 week (around 07/14/2022).   Ortho Exam  Patient is alert, oriented, no adenopathy, well-dressed, normal affect, normal respiratory effort. Examination there is significant maceration around the wound.  The Kerecis tissue graft has not incorporated as it commonly does with the cleanse choice sponge.  The VAC sponge is saturated with minimal drainage in the wound VAC canister.  There is an odor from the retained fluid.  No cellulitis no signs of infection.  Wound measures 3 x 1.5 cm and 0.1 cm deep  Imaging: No results found.    Labs: Lab Results  Component Value Date   HGBA1C 6.9 (A) 11/28/2021   HGBA1C 6.7 (A) 02/07/2021   HGBA1C 6.7 (A) 11/01/2020   REPTSTATUS 07/06/2022 FINAL 07/01/2022   GRAMSTAIN  07/01/2022    RARE WBC PRESENT,BOTH PMN AND MONONUCLEAR FEW GRAM POSITIVE COCCI IN PAIRS IN CLUSTERS    CULT  07/01/2022    ABUNDANT STAPHYLOCOCCUS EPIDERMIDIS NO ANAEROBES ISOLATED Performed at [facility], [address] [place], [place] [address]    [facility] STAPHYLOCOCCUS EPIDERMIDIS  07/01/2022     Lab Results  Component Value Date   ALBUMIN 4.0 08/02/2020    Lab Results  Component Value Date   MG 2.6 (H) 04/04/2020   MG 2.6 (H) 04/04/2020   MG 2.9 (H) 04/03/2020   No results found for: "VD25OH"  No results found for: "PREALBUMIN"    Latest Ref Rng & Units 07/01/2022    7:48 AM 03/06/2022    4:34 AM 03/05/2022    8:21 PM  CBC EXTENDED  WBC 4.0 - 10.5 K/uL 9.1  11.3  10.6   RBC 4.22 - 5.81 MIL/uL 4.02  4.32  3.80   Hemoglobin 13.0 - 17.0 g/dL 82.9  56.2  13.0   HCT 39.0 - 52.0 % 38.3  38.9  34.2   Platelets 150 - 400 K/uL 311  118  94      There is no height or weight on file to calculate BMI.  Orders:  No orders of the defined types were placed in this encounter.  No orders of the defined types were placed in this encounter.    Procedures: No procedures performed  Clinical Data: No additional findings.  ROS:  All other systems negative, except as noted in the HPI. Review of Systems  Objective: Vital Signs: There were no vitals taken  for this visit.  Specialty Comments:  No specialty comments available.  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   Coronary artery disease involving native coronary artery of native heart with unstable angina pectoris (HCC)    Acute congestive heart failure (HCC) 03/27/2020   Diabetic foot ulcers (HCC) 03/27/2020   DM2 (diabetes mellitus, type 2) (HCC) 03/27/2020   HTN (hypertension) 03/27/2020    Past Medical History:  Diagnosis Date   Blind right eye    Carotid artery disease (HCC)    04/01/20: 60-79% RICA stenosis, 1-39% LICA stenosis   Chronic kidney disease    ckd stage 3 a per dr Ronald Lobo 02-05-2021 epic   Coronary artery disease    NSTEMI s/p CABG in 03/2020 (LIMA-->LAD/diag, L radial-->OM1/OM3); b.   DM type 2    Glaucoma of right eye, unspecified glaucoma type    HFrEF (heart failure with reduced ejection fraction) (HCC)    Echo 6/22: EF 45, global HK, mild MR, RVSP 31.9   Hypertension    Ischemic cardiomyopathy    PAD (peripheral artery disease) (HCC)    occluded right axillary artery 03/29/20     Family History  Problem Relation Age of Onset   Diabetes Mother     Past Surgical History:  Procedure Laterality Date   AMPUTATION Right 04/24/2020   Procedure: RIGHT 2ND AND 3RD TOE AMPUTATION;  Surgeon: Nadara Mustard, MD;  Location: [facility] OR;  Service: Orthopedics;  Laterality: Right;   AMPUTATION Right 10/22/2021   Procedure: RIGHT TRANSMETATARSAL AMPUTATION;  Surgeon: Nadara Mustard, MD;  Location: [facility] OR;  Service: Orthopedics;  Laterality: Right;   APPLICATION OF WOUND VAC Right 10/22/2021   Procedure: APPLICATION OF WOUND VAC;  Surgeon: Nadara Mustard, MD;  Location: [facility] OR;  Service: Orthopedics;  Laterality: Right;   CENTRAL VENOUS CATHETER INSERTION Left 04/03/2020   Procedure: INSERTION OF FEMORAL ARTERIAL LINE;  Surgeon: Linden Dolin, MD;  Location: [facility] OR;  Service: Open Heart Surgery;  Laterality: Left;   CORONARY ARTERY BYPASS GRAFT N/A 04/03/2020   Procedure: CORONARY ARTERY BYPASS GRAFTING (CABG) TIMES FOUR USING LEFT INTERNAL MAMMARY ARTERY AND LEFT RADIAL ARTERY.;  Surgeon: Linden Dolin, MD;  Location: [facility] OR;  Service: Open Heart Surgery;  Laterality: N/A;   CORONARY PRESSURE/FFR WITH 3D MAPPING N/A 03/29/2020   Procedure: Coronary Pressure Wire/FFR w/3D Mapping;  Surgeon: Lyn Records, MD;  Location: [facility] INVASIVE CV LAB;  Service:  Cardiovascular;  Laterality: N/A;   EYE SURGERY Right 01/21/2021   right eye cataract lens replacement surgery   I & D EXTREMITY Left 07/01/2022   Procedure: LEFT FOOT DEBRIDEMENT AND FOREIGN BODY REMOVAL;  Surgeon: Nadara Mustard, MD;  Location: [facility] OR;  Service: Orthopedics;  Laterality: Left;   IR THORACENTESIS ASP PLEURAL SPACE W/IMG GUIDE  05/03/2020   IR THORACENTESIS ASP PLEURAL SPACE W/IMG GUIDE  05/16/2020   LEFT HEART CATH AND CORONARY ANGIOGRAPHY N/A 03/29/2020   Procedure:  LEFT HEART CATH AND CORONARY ANGIOGRAPHY;  Surgeon: Lyn Records, MD;  Location: [facility] INVASIVE CV LAB;  Service: Cardiovascular;  Laterality: N/A;   PENILE PROSTHESIS IMPLANT N/A 03/03/2021   Procedure: PENILE PROTHESIS INFLATABLE COLOPLAST;  Surgeon: Crista Elliot, MD;  Location: [facility];  Service: Urology;  Laterality: N/A;  REQUESTING 2 HRS   RADIAL ARTERY HARVEST Left 04/03/2020   Procedure: LEFT RADIAL ARTERY HARVEST;  Surgeon: Linden Dolin, MD;  Location: [facility] OR;  Service: Open Heart Surgery;  Laterality: Left;   TEE WITHOUT CARDIOVERSION N/A 04/03/2020   Procedure: TRANSESOPHAGEAL ECHOCARDIOGRAM (TEE);  Surgeon: Linden Dolin, MD;  Location: [facility] OR;  Service: Open Heart Surgery;  Laterality: N/A;   UPPER EXTREMITY ANGIOGRAPHY  03/29/2020   Procedure: Upper Extremity Angiography;  Surgeon: Lyn Records, MD;  Location: [facility] INVASIVE CV LAB;  Service: Cardiovascular;;  rt.  arm   Social History   Occupational History   Occupation: retired d/t medical conditions  Tobacco Use   Smoking status: Every Day    Packs/day: 1.00    Years: 0.50    Additional pack years: 0.00    Total pack years: 0.50    Types: Cigarettes    Passive exposure: Current   Smokeless tobacco: Never  Vaping Use   Vaping Use: Never used  Substance and Sexual Activity   Alcohol use: Not Currently    Alcohol/week: 1.0 standard drink of alcohol    Types: 1 Cans of beer per week    Comment: occassionally  beer   Drug use: Not Currently    Types: Marijuana    Comment: marijuana last use 3 xweek last used 02-23-2021   Sexual activity: Not Currently

## 2022-07-13 ENCOUNTER — Other Ambulatory Visit: Payer: Self-pay | Admitting: Nephrology

## 2022-07-13 DIAGNOSIS — N1831 Chronic kidney disease, stage 3a: Secondary | ICD-10-CM

## 2022-07-13 NOTE — Progress Notes (Unsigned)
Cardiology Office Note:    Date:  07/14/2022   ID:  JAFET MCFEATERS, DOB 1955/12/06, MRN [id number]  PCP:  Rema Fendt, NP   [facility] Providers Cardiologist:  Meriam Sprague, MD Cardiology APP:  Kennon Rounds  {    Referring MD: Rema Fendt, NP    History of Present Illness:    HYMAN BARRENO is a 67 y.o. male with a hx of with history of NSTEMI s/p CABG in 03/2020 (LIMA-->LAD/diag, L radial-->OM1/OM3), HFrEF with LVEF 25-30%, DMII, carotid artery disease, HTN and HLD who presents to clinic for follow-up.  The patient initially presented in 03/2020 with LE edema and SOB. TTE at that time showed LVEF 40-45% with hypokineis of the distal septal, distal inferior, distal lateral walls; and akinesis at the apex. He subsequently underwent LHC which revealed severe two-vessel disease involving the LAD and circumflex. He underwent 4vCABG on 04/03/20.  His postoperative course was relatively uncomplicated.   In early 04/2020 he underwent second and third toe amputation for a known diabetic foot ulcer.  He was also noted to have a large left pleural effusion at his surgical follow-up in 04/2020, and underwent thoracentesis on the 05/16/20 with over 1-1/2 L of serosanguineous fluid removal.  The patient continued to have SOB and volume overload after discharge prompting admission 05/24/20-05/28/20.  During admission TTE demonstrated worsening LV function with an EF of 25-30. He was diuresed 20lbs to 158 with improvement in symptoms.  He also underwent repeat R thoracentesis with 1500cc removed. His GDMT was optimized and he was discharged on losartan 12.5mg , metoprolol succinate 12.5mg , spiro 12.5mg , and jardiance 10mg  and lasix 40mg .  Cr increased slightly during admission 1.30>1.55.    Admitted 02/2022 where he presented with a flu A found to have a trop elevation in that setting thought to be due to demand and no further intervention recommended at that time.  Today, the patient  feels okay. He recently stepped on a piece of glass and had left foot debridement with Dr. Lajoyce Corners. He is currently on ABX and is following closely with Podiatry. Otherwise, he is doing well from a CV standpoint. No chest pain, SOB, LE edema, orthopnea, or PND. Blood pressure has been on the low side but no associated dizziness.   Past Medical History:  Diagnosis Date   Blind right eye    Carotid artery disease (HCC)    04/01/20: 60-79% RICA stenosis, 1-39% LICA stenosis   Chronic kidney disease    ckd stage 3 a per dr Ronald Lobo 02-05-2021 epic   Coronary artery disease    NSTEMI s/p CABG in 03/2020 (LIMA-->LAD/diag, L radial-->OM1/OM3); b.   DM type 2    Glaucoma of right eye, unspecified glaucoma type    HFrEF (heart failure with reduced ejection fraction) (HCC)    Echo 6/22: EF 45, global HK, mild MR, RVSP 31.9   Hypertension    Ischemic cardiomyopathy    PAD (peripheral artery disease) (HCC)    occluded right axillary artery 03/29/20     Past Surgical History:  Procedure Laterality Date   AMPUTATION Right 04/24/2020   Procedure: RIGHT 2ND AND 3RD TOE AMPUTATION;  Surgeon: Nadara Mustard, MD;  Location: [facility] OR;  Service: Orthopedics;  Laterality: Right;   AMPUTATION Right 10/22/2021   Procedure: RIGHT TRANSMETATARSAL AMPUTATION;  Surgeon: Nadara Mustard, MD;  Location: [facility] OR;  Service: Orthopedics;  Laterality: Right;   APPLICATION OF WOUND VAC Right 10/22/2021   Procedure:  APPLICATION OF WOUND VAC;  Surgeon: Nadara Mustard, MD;  Location: [facility] OR;  Service: Orthopedics;  Laterality: Right;   CENTRAL VENOUS CATHETER INSERTION Left 04/03/2020   Procedure: INSERTION OF FEMORAL ARTERIAL LINE;  Surgeon: Linden Dolin, MD;  Location: [facility] OR;  Service: Open Heart Surgery;  Laterality: Left;   CORONARY ARTERY BYPASS GRAFT N/A 04/03/2020   Procedure: CORONARY ARTERY BYPASS GRAFTING (CABG) TIMES FOUR USING LEFT INTERNAL MAMMARY ARTERY AND LEFT RADIAL ARTERY.;  Surgeon: Linden Dolin, MD;   Location: [facility] OR;  Service: Open Heart Surgery;  Laterality: N/A;   CORONARY PRESSURE/FFR WITH 3D MAPPING N/A 03/29/2020   Procedure: Coronary Pressure Wire/FFR w/3D Mapping;  Surgeon: Lyn Records, MD;  Location: [facility] INVASIVE CV LAB;  Service: Cardiovascular;  Laterality: N/A;   EYE SURGERY Right 01/21/2021   right eye cataract lens replacement surgery   I & D EXTREMITY Left 07/01/2022   Procedure: LEFT FOOT DEBRIDEMENT AND FOREIGN BODY REMOVAL;  Surgeon: Nadara Mustard, MD;  Location: [facility] OR;  Service: Orthopedics;  Laterality: Left;   IR THORACENTESIS ASP PLEURAL SPACE W/IMG GUIDE  05/03/2020   IR THORACENTESIS ASP PLEURAL SPACE W/IMG GUIDE  05/16/2020   LEFT HEART CATH AND CORONARY ANGIOGRAPHY N/A 03/29/2020   Procedure: LEFT HEART CATH AND CORONARY ANGIOGRAPHY;  Surgeon: Lyn Records, MD;  Location: [facility] INVASIVE CV LAB;  Service: Cardiovascular;  Laterality: N/A;   PENILE PROSTHESIS IMPLANT N/A 03/03/2021   Procedure: PENILE PROTHESIS INFLATABLE COLOPLAST;  Surgeon: Crista Elliot, MD;  Location: [facility] [place];  Service: Urology;  Laterality: N/A;  REQUESTING 2 HRS   RADIAL ARTERY HARVEST Left 04/03/2020   Procedure: LEFT RADIAL ARTERY HARVEST;  Surgeon: Linden Dolin, MD;  Location: [facility] OR;  Service: Open Heart Surgery;  Laterality: Left;   TEE WITHOUT CARDIOVERSION N/A 04/03/2020   Procedure: TRANSESOPHAGEAL ECHOCARDIOGRAM (TEE);  Surgeon: Linden Dolin, MD;  Location: [facility] OR;  Service: Open Heart Surgery;  Laterality: N/A;   UPPER EXTREMITY ANGIOGRAPHY  03/29/2020   Procedure: Upper Extremity Angiography;  Surgeon: Lyn Records, MD;  Location: [facility] INVASIVE CV LAB;  Service: Cardiovascular;;  rt.  arm    Current Medications: Current Meds  Medication Sig   aspirin 325 MG tablet Take 162 mg by mouth in the morning.   atorvastatin (LIPITOR) 80 MG tablet Take 1 tablet (80 mg total) by mouth daily.   blood glucose meter kit and supplies by Other route as directed.  Dispense based on patient and insurance preference. Use up to 2x times daily as directed. (FOR ICD-10 E10.9, E11.9).   brimonidine (ALPHAGAN) 0.2 % ophthalmic solution Place 1 drop into the left eye 2 (two) times daily.   ciprofloxacin (CIPRO) 500 MG tablet Take 1 tablet (500 mg total) by mouth 2 (two) times daily.   dorzolamide-timolol (COSOPT) 2-0.5 % ophthalmic solution Place 1 drop into the left eye 2 (two) times daily.   dorzolamide-timolol (COSOPT) 22.3-6.8 MG/ML ophthalmic solution Place 1 drop into the left eye 2 (two) times daily.   doxycycline (VIBRA-TABS) 100 MG tablet Take 1 tablet (100 mg total) by mouth 2 (two) times daily.   empagliflozin (JARDIANCE) 25 MG TABS tablet Take 1 tablet (25 mg total) by mouth daily before breakfast.   furosemide (LASIX) 20 MG tablet Take 1 tablet (20 mg total) by mouth 3 (three) times a week. Take on Mondays, Wednesdays, and Fridays.   losartan (COZAAR) 25 MG tablet Take 1/2 tablet (12.5 mg total)  by mouth daily.   metoprolol succinate (TOPROL XL) 25 MG 24 hr tablet Take 1/2 tablet (12.5 mg total) by mouth daily.   oxyCODONE-acetaminophen (PERCOCET/ROXICET) 5-325 MG tablet Take 1 tablet by mouth every 4 (four) hours as needed.   spironolactone (ALDACTONE) 25 MG tablet Take 1/2 tablet (12.5 mg total) by mouth daily.   [DISCONTINUED] metoprolol succinate (TOPROL XL) 25 MG 24 hr tablet Take 1.5 tablets (37.5 mg total) by mouth daily.     Allergies:   Patient has no known allergies.   Social History   Socioeconomic History   Marital status: Significant Other    Spouse name: Not on file   Number of children: 3   Years of education: Not on file   Highest education level: High school graduate  Occupational History   Occupation: retired d/t medical conditions  Tobacco Use   Smoking status: Every Day    Packs/day: 1.00    Years: 0.50    Additional pack years: 0.00    Total pack years: 0.50    Types: Cigarettes    Passive exposure: Current    Smokeless tobacco: Never  Vaping Use   Vaping Use: Never used  Substance and Sexual Activity   Alcohol use: Not Currently    Alcohol/week: 1.0 standard drink of alcohol    Types: 1 Cans of beer per week    Comment: occassionally beer   Drug use: Not Currently    Types: Marijuana    Comment: marijuana last use 3 xweek last used 02-23-2021   Sexual activity: Not Currently  Other Topics Concern   Not on file  Social History Narrative   Not on file   Social Determinants of Health   Financial Resource Strain: High Risk (05/28/2020)   Overall Financial Resource Strain (CARDIA)    Difficulty of Paying Living Expenses: Hard  Food Insecurity: No Food Insecurity (04/20/2022)   Hunger Vital Sign    Worried About Running Out of Food in the Last Year: Never true    Ran Out of Food in the Last Year: Never true  Transportation Needs: No Transportation Needs (04/20/2022)   PRAPARE - Administrator, Civil Service (Medical): No    Lack of Transportation (Non-Medical): No  Physical Activity: Inactive (05/28/2020)   Exercise Vital Sign    Days of Exercise per Week: 0 days    Minutes of Exercise per Session: 0 min  Stress: Not on file  Social Connections: Not on file     Family History: The patient's family history includes Diabetes in his mother.  ROS:   As per HPI   EKGs/Labs/Other Studies Reviewed:    The following studies were reviewed today:  Cardiac Studies & Procedures   CARDIAC CATHETERIZATION  CARDIAC CATHETERIZATION 03/29/2020  Narrative  Diabetic with severe two-vessel disease including the LAD and complex disease in the circumflex.  Ischemic cardiomyopathy with LVEF 35 to 40%.  LVEDP is less than 10 mmHg.  Left main is widely patent  Right coronary is widely patent but contains diffuse atherosclerosis without focal narrowing.  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  RECOMMENDATIONS:   Continue IV heparin  Continue to treat heart failure  Watch  kidney function  T CTS evaluation to consider coronary artery bypass grafting with LIMA to the LAD.  SVG to circumflex or arterial graft assuming that the left radial has flow..  Findings Coronary Findings Diagnostic  Dominance: Right  Left Anterior Descending There is mild diffuse disease throughout the vessel.  Mid LAD lesion is 85% stenosed.  First Diagonal Branch Vessel is small in size. There is mild disease in the vessel.  Second Diagonal Branch There is moderate disease in the vessel.  Third Diagonal Branch There is moderate disease in the vessel.  Left Circumflex Prox Cx to Mid Cx lesion is 80% stenosed.  First Obtuse Marginal Branch 1st Mrg lesion is 90% stenosed.  Right Coronary Artery There is moderate diffuse disease throughout the vessel.  Right Posterior Descending Artery There is moderate disease in the vessel.  Right Posterior Atrioventricular Artery There is moderate disease in the vessel.  Intervention  No interventions have been documented.     ECHOCARDIOGRAM  ECHOCARDIOGRAM COMPLETE 03/06/2022  Narrative ECHOCARDIOGRAM REPORT    Patient Name:   WILLIES OZAKI Date of Exam: 03/06/2022 Medical Rec #:  [id number]   Height:       70.0 in Accession #:    [id number]  Weight:       164.4 lb Date of Birth:  [birth year]  BSA:          1.920 m Patient Age:    66 years    BP:           105/68 mmHg Patient Gender: M           HR:           66 bpm. Exam Location:  Inpatient  Procedure: 2D Echo and Strain Analysis  Indications:    elevated troponin  History:        Patient has prior history of Echocardiogram examinations, most recent 03/28/2020. CAD; Risk Factors:Hypertension and Diabetes.  Sonographer:    Cathie Hoops Referring Phys: [id number] CAROLE N HALL   Sonographer Comments: Global longitudinal strain was attempted. IMPRESSIONS   1. Left ventricular ejection fraction, by estimation, is 45 to 50%. The left ventricle has mildly decreased  function. The left ventricle demonstrates global hypokinesis. Left ventricular diastolic parameters were normal. The average left ventricular global longitudinal strain is -17.8 %. The global longitudinal strain is abnormal. 2. Right ventricular systolic function is normal. The right ventricular size is normal. There is normal pulmonary artery systolic pressure. The estimated right ventricular systolic pressure is 6.0 mmHg. 3. The mitral valve is grossly normal. Mild mitral valve regurgitation. No evidence of mitral stenosis. 4. The aortic valve is tricuspid. Aortic valve regurgitation is not visualized. No aortic stenosis is present. 5. The inferior vena cava is normal in size with greater than 50% respiratory variability, suggesting right atrial pressure of 3 mmHg.  Comparison(s): No significant change from prior study.  FINDINGS Left Ventricle: Left ventricular ejection fraction, by estimation, is 45 to 50%. The left ventricle has mildly decreased function. The left ventricle demonstrates global hypokinesis. The average left ventricular global longitudinal strain is -17.8 %. The global longitudinal strain is abnormal. The left ventricular internal cavity size was normal in size. There is no left ventricular hypertrophy. Abnormal (paradoxical) septal motion consistent with post-operative status. Left ventricular diastolic parameters were normal.  Right Ventricle: The right ventricular size is normal. No increase in right ventricular wall thickness. Right ventricular systolic function is normal. There is normal pulmonary artery systolic pressure. The tricuspid regurgitant velocity is 0.87 m/s, and with an assumed right atrial pressure of 3 mmHg, the estimated right ventricular systolic pressure is 6.0 mmHg.  Left Atrium: Left atrial size was normal in size.  Right Atrium: Right atrial size was normal in size.  Pericardium: There is no evidence of pericardial effusion.  Mitral Valve: The  mitral valve is grossly normal. Mild mitral valve regurgitation. No evidence of mitral valve stenosis.  Tricuspid Valve: The tricuspid valve is grossly normal. Tricuspid valve regurgitation is trivial. No evidence of tricuspid stenosis.  Aortic Valve: The aortic valve is tricuspid. Aortic valve regurgitation is not visualized. No aortic stenosis is present. Aortic valve mean gradient measures 2.0 mmHg. Aortic valve peak gradient measures 4.5 mmHg. Aortic valve area, by VTI measures 2.61 cm.  Pulmonic Valve: The pulmonic valve was not well visualized. Pulmonic valve regurgitation is not visualized. No evidence of pulmonic stenosis.  Aorta: The aortic root is normal in size and structure.  Venous: The inferior vena cava is normal in size with greater than 50% respiratory variability, suggesting right atrial pressure of 3 mmHg.  IAS/Shunts: The atrial septum is grossly normal.   LEFT VENTRICLE PLAX 2D LVIDd:         4.80 cm      Diastology LVIDs:         3.50 cm      LV e' medial:    7.72 cm/s LV PW:         1.10 cm      LV E/e' medial:  9.5 LV IVS:        1.10 cm      LV e' lateral:   9.14 cm/s LVOT diam:     2.10 cm      LV E/e' lateral: 8.0 LV SV:         59 LV SV Index:   31           2D Longitudinal Strain LVOT Area:     3.46 cm     2D Strain GLS Avg:     -17.8 %  LV Volumes (MOD) LV vol d, MOD A2C: 162.0 ml LV vol d, MOD A4C: 120.0 ml LV vol s, MOD A2C: 107.0 ml LV vol s, MOD A4C: 62.2 ml LV SV MOD A2C:     55.0 ml LV SV MOD A4C:     120.0 ml LV SV MOD BP:      57.0 ml  RIGHT VENTRICLE RV Basal diam:  2.90 cm RV Mid diam:    2.60 cm RV S prime:     9.57 cm/s TAPSE (M-mode): 1.6 cm  LEFT ATRIUM             Index        RIGHT ATRIUM          Index LA diam:        3.10 cm 1.61 cm/m   RA Area:     8.87 cm LA Vol (A2C):   51.2 ml 26.66 ml/m  RA Volume:   17.70 ml 9.22 ml/m LA Vol (A4C):   31.0 ml 16.14 ml/m LA Biplane Vol: 42.8 ml 22.29 ml/m AORTIC VALVE AV Area  (Vmax):    2.66 cm AV Area (Vmean):   2.50 cm AV Area (VTI):     2.61 cm AV Vmax:           106.00 cm/s AV Vmean:          70.800 cm/s AV VTI:            0.226 m AV Peak Grad:      4.5 mmHg AV Mean Grad:      2.0 mmHg LVOT Vmax:         81.30 cm/s LVOT Vmean:        51.100 cm/s  LVOT VTI:          0.170 m LVOT/AV VTI ratio: 0.75  AORTA Ao Root diam: 3.60 cm  MITRAL VALVE               TRICUSPID VALVE MV Area (PHT): 3.91 cm    TR Peak grad:   3.0 mmHg MV Decel Time: 194 msec    TR Vmax:        87.10 cm/s MR Peak grad: 123.8 mmHg MR Vmax:      556.40 cm/s  SHUNTS MV E velocity: 73.40 cm/s  Systemic VTI:  0.17 m MV A velocity: 74.40 cm/s  Systemic Diam: 2.10 cm MV E/A ratio:  0.99  Lennie Odor MD Electronically signed by Lennie Odor MD Signature Date/Time: 03/06/2022/1:16:21 PM    Final   TEE  ECHO INTRAOPERATIVE TEE 04/05/2020  Narrative *INTRAOPERATIVE TRANSESOPHAGEAL REPORT *    Patient Name:   [name]    Date of Exam: 04/03/2020 Medical Rec #:  [id number]      Height:       71.0 in Accession #:    [id number]     Weight:       173.2 lb Date of Birth:  1955/05/29     BSA:          1.98 m Patient Age:    64 years       BP:           108/62 mmHg Patient Gender: M              HR:           66 bpm. Exam Location:  Anesthesiology  Transesophogeal exam was perform intraoperatively during surgical procedure. Patient was closely monitored under general anesthesia during the entirety of examination.  Indications:     CAD Native Vessel i25.10 Sonographer:     Irving Burton Senior RDCS Performing Phys: [id number] [facility] Diagnosing Phys: Arrie Aran MD  Complications: No known complications during this procedure. POST-OP IMPRESSIONS - Left Ventricle: The cavity size was normal. The wall motion is normal. - Aorta: The aorta appears unchanged from pre-bypass. - Left Atrial Appendage: The left atrial appendage appears unchanged from pre-bypass. - Aortic  Valve: The aortic valve appears unchanged from pre-bypass. - Mitral Valve: There is mild regurgitation. - Tricuspid Valve: The tricuspid valve appears unchanged from pre-bypass. - Interatrial Septum: The interatrial septum appears unchanged from pre-bypass.  PRE-OP FINDINGS Left Ventricle: The left ventricle has mild-moderately reduced systolic function, with an ejection fraction of 40-45%. The cavity size was normal. There is mildly increased left ventricular wall thickness. Left ventricular diffuse hypokinesis.  Right Ventricle: The right ventricle has normal systolic function. The cavity was normal. There is no increase in right ventricular wall thickness.  Left Atrium: Left atrial size was dilated. The left atrial appendage is well visualized and there is no evidence of thrombus present. Left atrial appendage velocity is reduced at less than 40 cm/s.  Right Atrium: Right atrial size was normal in size.  Interatrial Septum: No atrial level shunt detected by color flow Doppler.  Pericardium: There is no evidence of pericardial effusion.  Mitral Valve: The mitral valve is normal in structure. Mitral valve regurgitation is mild by color flow Doppler. The MR jet is centrally-directed. There is No evidence of mitral stenosis.  Tricuspid Valve: The tricuspid valve was normal in structure. Tricuspid valve regurgitation was not visualized by color flow Doppler.  Aortic Valve: The aortic valve is tricuspid  Aortic valve regurgitation was not visualized by color flow Doppler. There is no evidence of aortic valve stenosis.  Pulmonic Valve: The pulmonic valve was normal in structure. Pulmonic valve regurgitation is not visualized by color flow Doppler.   Aorta: The aortic root, aortic arch and ascending aorta are normal in size and structure.  Pulmonary Artery: The pulmonary artery is of normal size.  +--------------+-------++ LEFT VENTRICLE        +--------------+-------++ PLAX 2D                +--------------+-------++ LVIDd:        5.20 cm +--------------+-------++ LVIDs:        4.45 cm +--------------+-------++ LV PW:        1.30 cm +--------------+-------++ LV IVS:       1.00 cm +--------------+-------++ LV SV:        39 ml   +--------------+-------++ LV SV Index:  19.84   +--------------+-------++                       +--------------+-------++   Arrie Aran MD Electronically signed by Arrie Aran MD Signature Date/Time: 04/05/2020/9:12:15 AM    Final             EKG:   01/06/22: NSR with LVH HR 69  Recent Labs: 05/15/2022: TSH 1.450 07/01/2022: BUN 31; Creatinine, Ser 1.71; Hemoglobin 13.0; Platelets 311; Potassium 4.8; Sodium 133  Recent Lipid Panel    Component Value Date/Time   CHOL 100 08/02/2020 1146   TRIG 55 08/02/2020 1146   HDL 52 08/02/2020 1146   CHOLHDL 1.9 08/02/2020 1146   CHOLHDL 2.8 03/29/2020 0600   VLDL 12 03/29/2020 0600   LDLCALC 35 08/02/2020 1146         Physical Exam:    VS:  BP (!) 88/60   Pulse 68   Ht 5\' 10"  (1.778 m)   Wt 160 lb 12.8 oz (72.9 kg)   SpO2 98%   BMI 23.07 kg/m     Wt Readings from Last 3 Encounters:  07/14/22 160 lb 12.8 oz (72.9 kg)  07/01/22 155 lb (70.3 kg)  06/29/22 160 lb (72.6 kg)     GEN:  Well nourished, well developed in no acute distress HEENT: Normal NECK: No JVD; + left carotid bruit CARDIAC: RRR,  1/6 systolic murmur. No rubs, gallops RESPIRATORY:  Clear to auscultation without rales, wheezing or rhonchi  ABDOMEN: Soft, non-tender, non-distended MUSCULOSKELETAL:  No edema; No deformity  SKIN: Warm and dry NEUROLOGIC:  Alert and oriented x 3 PSYCHIATRIC:  Normal affect   ASSESSMENT:    1. Coronary artery disease involving native coronary artery of native heart without angina pectoris   2. Chronic systolic heart failure (HCC)   3. Stage 3a chronic kidney disease (HCC)   4. Essential hypertension   5. Type 2 diabetes mellitus with  hyperosmolarity without coma, without long-term current use of insulin (HCC)   6. Tobacco abuse   7. Bilateral carotid artery disease, unspecified type (HCC)   8. Left carotid bruit    PLAN:    In order of problems listed above:  #Chronic systolic heart failure, Ischemic Cardiomyopathy:  Patient with history of CABG x4 in 03/2020 with EF at 40-45% on 05/2020 dropped to 25-30% in 05/2020, however, limited TTE 08/20/20 with recovery back to 45-50% which remained stable on TTE 02/2022. Currently doing well with NYHA class II symptoms.  -Decrease metop 12.5mg  XL daily due to soft blood pressures today -  Continue lasix 20mg  daily -Continue losartan 12.5 mg daily (does not want to switch to entresto due to soft BP) -Continue aldactone 12.5 mg daily -Continue Jardiance 10mg  daily -Low Na diet -Monitor daily weights   #Recurrent Pleural Effusions: Have been present since CABG in February requiring thoracentesis x2 (once in 04/2020 in the outpatient setting by IR and also in 05/2020 during admission by PCCM). Last CXR with small persistent effusions. Currently no shortness of breath or orthopnea. Weights stable.  -Continue lasix 20mg  daily       #CAD status post CABG  Underwent 4v CABG in 03/2020 with LIMA-->LAD/diag, L radial-->OM1/OM3. Now doing well without anginal symptoms.  -Continue ASA 81mg  daily -Continue plavix 75mg  daily -Decrease metop 12.5mg  XL daily due to soft blood pressures today -Continue losartan 12.5 mg daily -Continue lipitor 80mg  daily   #Tobacco Use Disorder: Working on quitting.  -Continue smoking cessation efforts    #Hypertension  Blood pressure soft today. No dizziness, lightheadedness or fatigue. -Decrease metop 12.5mg  XL daily due to soft blood pressures today -Continue losartan 12.5 mg daily -Continue aldactone 12.5 mg daily    #T2DM:  -Continue Jardiance 10mg  daily   -last HbA1c 6.7    #CKD IIIa:  -Trend  #Left Foot Wound: -S/p debridement with Dr.  Lajoyce Corners -On ABX therapy -Follow-up with Dr. Lajoyce Corners as scheduled  #Left Carotid Bruit: -Check carotid ultrasound   Medication Adjustments/Labs and Tests Ordered: Current medicines are reviewed at length with the patient today.  Concerns regarding medicines are outlined above.  Orders Placed This Encounter  Procedures   VAS US CAROTID   Meds ordered this encounter  Medications   metoprolol succinate (TOPROL XL) 25 MG 24 hr tablet    Sig: Take 1/2 tablet (12.5 mg total) by mouth daily.    Dispense:  45 tablet    Refill:  3    Dose decrease    Patient Instructions  Medication Instructions:   DECREASE YOUR METOPROLOL TARTRATE (TOPROL XL) TO 12.5 MG BY MOUTH DAILY  *If you need a refill on your cardiac medications before your next appointment, please call your pharmacy*    Testing/Procedures:  Your physician has requested that you have a carotid duplex. This test is an ultrasound of the carotid arteries in your neck. It looks at blood flow through these arteries that supply the brain with blood. Allow one hour for this exam. There are no restrictions or special instructions.    Follow-Up: At [facility], you and your health needs are our priority.  As part of our continuing mission to provide you with exceptional heart care, we have created designated Provider Care Teams.  These Care Teams include your primary Cardiologist (physician) and Advanced Practice Providers (APPs -  Physician Assistants and Nurse Practitioners) who all work together to provide you with the care you need, when you need it.  We recommend signing up for the patient portal called "MyChart".  Sign up information is provided on this After Visit Summary.  MyChart is used to connect with patients for Virtual Visits (Telemedicine).  Patients are able to view lab/test results, encounter notes, upcoming appointments, etc.  Non-urgent messages can be sent to your provider as well.   To learn more about what you  can do with MyChart, go to [email or web address].    Your next appointment:   6 month(s)  Provider:   Jari Favre, PA-C, Ronie Spies, PA-C, Robin Searing, NP, Jacolyn Reedy, PA-C, Eligha Bridegroom, NP, or Tereso Newcomer, PA-C  Signed, Meriam Sprague, MD  07/14/2022 2:45 PM    [facility]

## 2022-07-14 ENCOUNTER — Ambulatory Visit: Payer: 59 | Attending: Cardiology | Admitting: Cardiology

## 2022-07-14 ENCOUNTER — Encounter: Payer: Self-pay | Admitting: Orthopedic Surgery

## 2022-07-14 ENCOUNTER — Other Ambulatory Visit (HOSPITAL_COMMUNITY): Payer: Self-pay

## 2022-07-14 ENCOUNTER — Encounter: Payer: Self-pay | Admitting: Cardiology

## 2022-07-14 ENCOUNTER — Ambulatory Visit (INDEPENDENT_AMBULATORY_CARE_PROVIDER_SITE_OTHER): Payer: 59 | Admitting: Orthopedic Surgery

## 2022-07-14 VITALS — BP 88/60 | HR 68 | Ht 70.0 in | Wt 160.8 lb

## 2022-07-14 DIAGNOSIS — N1831 Chronic kidney disease, stage 3a: Secondary | ICD-10-CM | POA: Diagnosis not present

## 2022-07-14 DIAGNOSIS — I5022 Chronic systolic (congestive) heart failure: Secondary | ICD-10-CM | POA: Diagnosis not present

## 2022-07-14 DIAGNOSIS — I10 Essential (primary) hypertension: Secondary | ICD-10-CM | POA: Diagnosis not present

## 2022-07-14 DIAGNOSIS — R0989 Other specified symptoms and signs involving the circulatory and respiratory systems: Secondary | ICD-10-CM | POA: Diagnosis not present

## 2022-07-14 DIAGNOSIS — I779 Disorder of arteries and arterioles, unspecified: Secondary | ICD-10-CM | POA: Diagnosis not present

## 2022-07-14 DIAGNOSIS — Z7984 Long term (current) use of oral hypoglycemic drugs: Secondary | ICD-10-CM

## 2022-07-14 DIAGNOSIS — Z720 Tobacco use: Secondary | ICD-10-CM

## 2022-07-14 DIAGNOSIS — I2510 Atherosclerotic heart disease of native coronary artery without angina pectoris: Secondary | ICD-10-CM | POA: Diagnosis not present

## 2022-07-14 DIAGNOSIS — S90852A Superficial foreign body, left foot, initial encounter: Secondary | ICD-10-CM

## 2022-07-14 DIAGNOSIS — E1100 Type 2 diabetes mellitus with hyperosmolarity without nonketotic hyperglycemic-hyperosmolar coma (NKHHC): Secondary | ICD-10-CM | POA: Diagnosis not present

## 2022-07-14 DIAGNOSIS — L97521 Non-pressure chronic ulcer of other part of left foot limited to breakdown of skin: Secondary | ICD-10-CM

## 2022-07-14 MED ORDER — METOPROLOL SUCCINATE ER 25 MG PO TB24
12.5000 mg | ORAL_TABLET | Freq: Every day | ORAL | 3 refills | Status: DC
  Filled 2022-07-14: qty 45, 90d supply, fill #0

## 2022-07-14 NOTE — Progress Notes (Signed)
Office Visit Note   Patient: Curtis Watkins           Date of Birth: Feb 20, 1956           MRN: [id number] Visit Date: 07/14/2022              Requested by: Rema Fendt, NP [address] [place],  [place] [address] PCP: Rema Fendt, NP  Chief Complaint  Patient presents with   Left Foot - Routine Post Op    07/01/2022 left foot debridement and removal foreign body new vac sponge       HPI: Patient is a 67 year old gentleman who presents 2 weeks status post debridement abscess lateral left heel and removal of glass foreign body.  Patient initially had a new wound VAC sponge there was maceration patient was changed to a dry dressing.  Patient also has a Wagner grade 1 ulcer beneath the first metatarsal head.  Patient is on Cipro and Doxy.  Assessment & Plan: Visit Diagnoses:  1. Foreign body in left foot, initial encounter   2. Ulcer of left foot, limited to breakdown of skin (HCC)     Plan: Continue Dial soap cleansing 4 x 4 gauze Ace wrap protected weightbearing in a postoperative shoe  Follow-Up Instructions: Return in about 1 week (around 07/21/2022).   Ortho Exam  Patient is alert, oriented, no adenopathy, well-dressed, normal affect, normal respiratory effort. Examination of the first metatarsal head ulcer left foot has healthy granulation tissue.  No exposed bone or tendon.  There is callus over the fourth metatarsal head that was pared.  The ulcer over the lateral left heel is 1 x 3 cm with healthy granulation tissue the periwound area shows no maceration no cellulitis no drainage.  Imaging: No results found.    Labs: Lab Results  Component Value Date   HGBA1C 6.9 (A) 11/28/2021   HGBA1C 6.7 (A) 02/07/2021   HGBA1C 6.7 (A) 11/01/2020   REPTSTATUS 07/06/2022 FINAL 07/01/2022   GRAMSTAIN  07/01/2022    RARE WBC PRESENT,BOTH PMN AND MONONUCLEAR FEW GRAM POSITIVE COCCI IN PAIRS IN CLUSTERS    CULT  07/01/2022    ABUNDANT STAPHYLOCOCCUS  EPIDERMIDIS NO ANAEROBES ISOLATED Performed at [facility], [address] [place], [place] [address]    [facility] STAPHYLOCOCCUS EPIDERMIDIS 07/01/2022     Lab Results  Component Value Date   ALBUMIN 4.0 08/02/2020    Lab Results  Component Value Date   MG 2.6 (H) 04/04/2020   MG 2.6 (H) 04/04/2020   MG 2.9 (H) 04/03/2020   No results found for: "VD25OH"  No results found for: "PREALBUMIN"    Latest Ref Rng & Units 07/01/2022    7:48 AM 03/06/2022    4:34 AM 03/05/2022    8:21 PM  CBC EXTENDED  WBC 4.0 - 10.5 K/uL 9.1  11.3  10.6   RBC 4.22 - 5.81 MIL/uL 4.02  4.32  3.80   Hemoglobin 13.0 - 17.0 g/dL 82.9  56.2  13.0   HCT 39.0 - 52.0 % 38.3  38.9  34.2   Platelets 150 - 400 K/uL 311  118  94      There is no height or weight on file to calculate BMI.  Orders:  No orders of the defined types were placed in this encounter.  No orders of the defined types were placed in this encounter.    Procedures: No procedures performed  Clinical Data: No additional findings.  ROS:  All other systems negative, except as noted in the HPI. Review of Systems  Objective: Vital Signs: There were no vitals taken for this visit.  Specialty Comments:  No specialty comments available.  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   Coronary artery disease involving native coronary artery of native heart with unstable angina pectoris (HCC)    Acute congestive heart  failure (HCC) 03/27/2020   Diabetic foot ulcers (HCC) 03/27/2020   DM2 (diabetes mellitus, type 2) (HCC) 03/27/2020   HTN (hypertension) 03/27/2020   Past Medical History:  Diagnosis Date   Blind right eye    Carotid artery disease (HCC)    04/01/20: 60-79% RICA stenosis, 1-39% LICA stenosis   Chronic kidney disease    ckd stage 3 a per dr Ronald Lobo 02-05-2021 epic   Coronary artery disease    NSTEMI s/p CABG in 03/2020 (LIMA-->LAD/diag, L radial-->OM1/OM3); b.   DM type 2    Glaucoma of right eye, unspecified glaucoma type    HFrEF (heart failure with reduced ejection fraction) (HCC)    Echo 6/22: EF 45, global HK, mild MR, RVSP 31.9   Hypertension    Ischemic cardiomyopathy    PAD (peripheral artery disease) (HCC)    occluded right axillary artery 03/29/20     Family History  Problem Relation Age of Onset   Diabetes Mother     Past Surgical History:  Procedure Laterality Date   AMPUTATION Right 04/24/2020   Procedure: RIGHT 2ND AND 3RD TOE AMPUTATION;  Surgeon: Nadara Mustard, MD;  Location: [facility] OR;  Service: Orthopedics;  Laterality: Right;   AMPUTATION Right 10/22/2021   Procedure: RIGHT TRANSMETATARSAL AMPUTATION;  Surgeon: Nadara Mustard, MD;  Location: [facility] OR;  Service: Orthopedics;  Laterality: Right;   APPLICATION OF WOUND VAC Right 10/22/2021   Procedure: APPLICATION OF WOUND VAC;  Surgeon: Nadara Mustard, MD;  Location: [facility] OR;  Service: Orthopedics;  Laterality: Right;   CENTRAL VENOUS CATHETER INSERTION Left 04/03/2020   Procedure: INSERTION OF FEMORAL ARTERIAL LINE;  Surgeon: Linden Dolin, MD;  Location: [facility] OR;  Service: Open Heart Surgery;  Laterality: Left;   CORONARY ARTERY BYPASS GRAFT N/A 04/03/2020   Procedure: CORONARY ARTERY BYPASS GRAFTING (CABG) TIMES FOUR USING LEFT INTERNAL MAMMARY ARTERY AND LEFT RADIAL ARTERY.;  Surgeon: Linden Dolin, MD;  Location: [facility] OR;  Service: Open Heart Surgery;  Laterality: N/A;   CORONARY PRESSURE/FFR WITH 3D MAPPING N/A  03/29/2020   Procedure: Coronary Pressure Wire/FFR w/3D Mapping;  Surgeon: Lyn Records, MD;  [facility]: [facility] INVASIVE CV LAB;  Service: Cardiovascular;  Laterality: N/A;   EYE SURGERY Right 01/21/2021   right eye cataract lens replacement surgery   I & D EXTREMITY Left 07/01/2022   Procedure: LEFT FOOT DEBRIDEMENT AND FOREIGN BODY REMOVAL;  Surgeon: Nadara Mustard, MD;  Location: [facility] OR;  Service: Orthopedics;  Laterality: Left;   IR THORACENTESIS ASP PLEURAL SPACE W/IMG GUIDE  05/03/2020  IR THORACENTESIS ASP PLEURAL SPACE W/IMG GUIDE  05/16/2020   LEFT HEART CATH AND CORONARY ANGIOGRAPHY N/A 03/29/2020   Procedure: LEFT HEART CATH AND CORONARY ANGIOGRAPHY;  Surgeon: Lyn Records, MD;  [facility] INV[facility] LAB;  Service: Cardiovascular;  Laterality: N/A;   PENILE PROSTHESIS IMPLANT N/A 03/03/2021   Procedure: PENILE PROTHESIS INFLATABLE COLOPLAST;  Surgeon: Crista Elliot, MD;  Location: [facility] [place];  Service: Urology;  Laterality: N/A;  REQUESTING 2 HRS   RADIAL ARTERY HARVEST Left 04/03/2020   Procedure: LEFT RADIAL ARTERY HARVEST;  Surgeon: Linden Dolin, MD;  Location: [facility] OR;  Service: Open Heart Surgery;  Laterality: Left;   TEE WITHOUT CARDIOVERSION N/A 04/03/2020   Procedure: TRANSESOPHAGEAL ECHOCARDIOGRAM (TEE);  Surgeon: Linden Dolin, MD;  Location: [facility] OR;  Service: Open Heart Surgery;  Laterality: N/A;   UPPER EXTREMITY ANGIOGRAPHY  03/29/2020   Procedure: Upper Extremity Angiography;  Surgeon: Lyn Records, MD;  Location: [facility] INVASIVE CV LAB;  Service: Cardiovascular;;  rt.  arm   Social History   Occupational History   Occupation: retired d/t medical conditions  Tobacco Use   Smoking status: Every Day    Packs/day: 1.00    Years: 0.50    Additional pack years: 0.00    Total pack years: 0.50    Types: Cigarettes    Passive exposure: Current   Smokeless tobacco: Never  Vaping Use   Vaping Use: Never used  Substance and Sexual Activity    Alcohol use: Not Currently    Alcohol/week: 1.0 standard drink of alcohol    Types: 1 Cans of beer per week    Comment: occassionally beer   Drug use: Not Currently    Types: Marijuana    Comment: marijuana last use 3 xweek last used 02-23-2021   Sexual activity: Not Currently

## 2022-07-14 NOTE — Patient Instructions (Signed)
Medication Instructions:   DECREASE YOUR METOPROLOL TARTRATE (TOPROL XL) TO 12.5 MG BY MOUTH DAILY  *If you need a refill on your cardiac medications before your next appointment, please call your pharmacy*    Testing/Procedures:  Your physician has requested that you have a carotid duplex. This test is an ultrasound of the carotid arteries in your neck. It looks at blood flow through these arteries that supply the brain with blood. Allow one hour for this exam. There are no restrictions or special instructions.    Follow-Up: At [facility], you and your health needs are our priority.  As part of our continuing mission to provide you with exceptional heart care, we have created designated Provider Care Teams.  These Care Teams include your primary Cardiologist (physician) and Advanced Practice Providers (APPs -  Physician Assistants and Nurse Practitioners) who all work together to provide you with the care you need, when you need it.  We recommend signing up for the patient portal called "MyChart".  Sign up information is provided on this After Visit Summary.  MyChart is used to connect with patients for Virtual Visits (Telemedicine).  Patients are able to view lab/test results, encounter notes, upcoming appointments, etc.  Non-urgent messages can be sent to your provider as well.   To learn more about what you can do with MyChart, go to [email or web address].    Your next appointment:   6 month(s)  Provider:   Jari Favre, PA-C, Ronie Spies, PA-C, Robin Searing, NP, Jacolyn Reedy, PA-C, Eligha Bridegroom, NP, or Tereso Newcomer, PA-C

## 2022-07-17 IMAGING — DX DG CHEST 1V PORT
1 series · 1 of 1 positions shown · non-contrast
Comparison: 03/27/2020 chest radiograph.

CLINICAL DATA: Status post 4 vessel CABG

EXAM:
PORTABLE CHEST 1 VIEW

[chest ap]
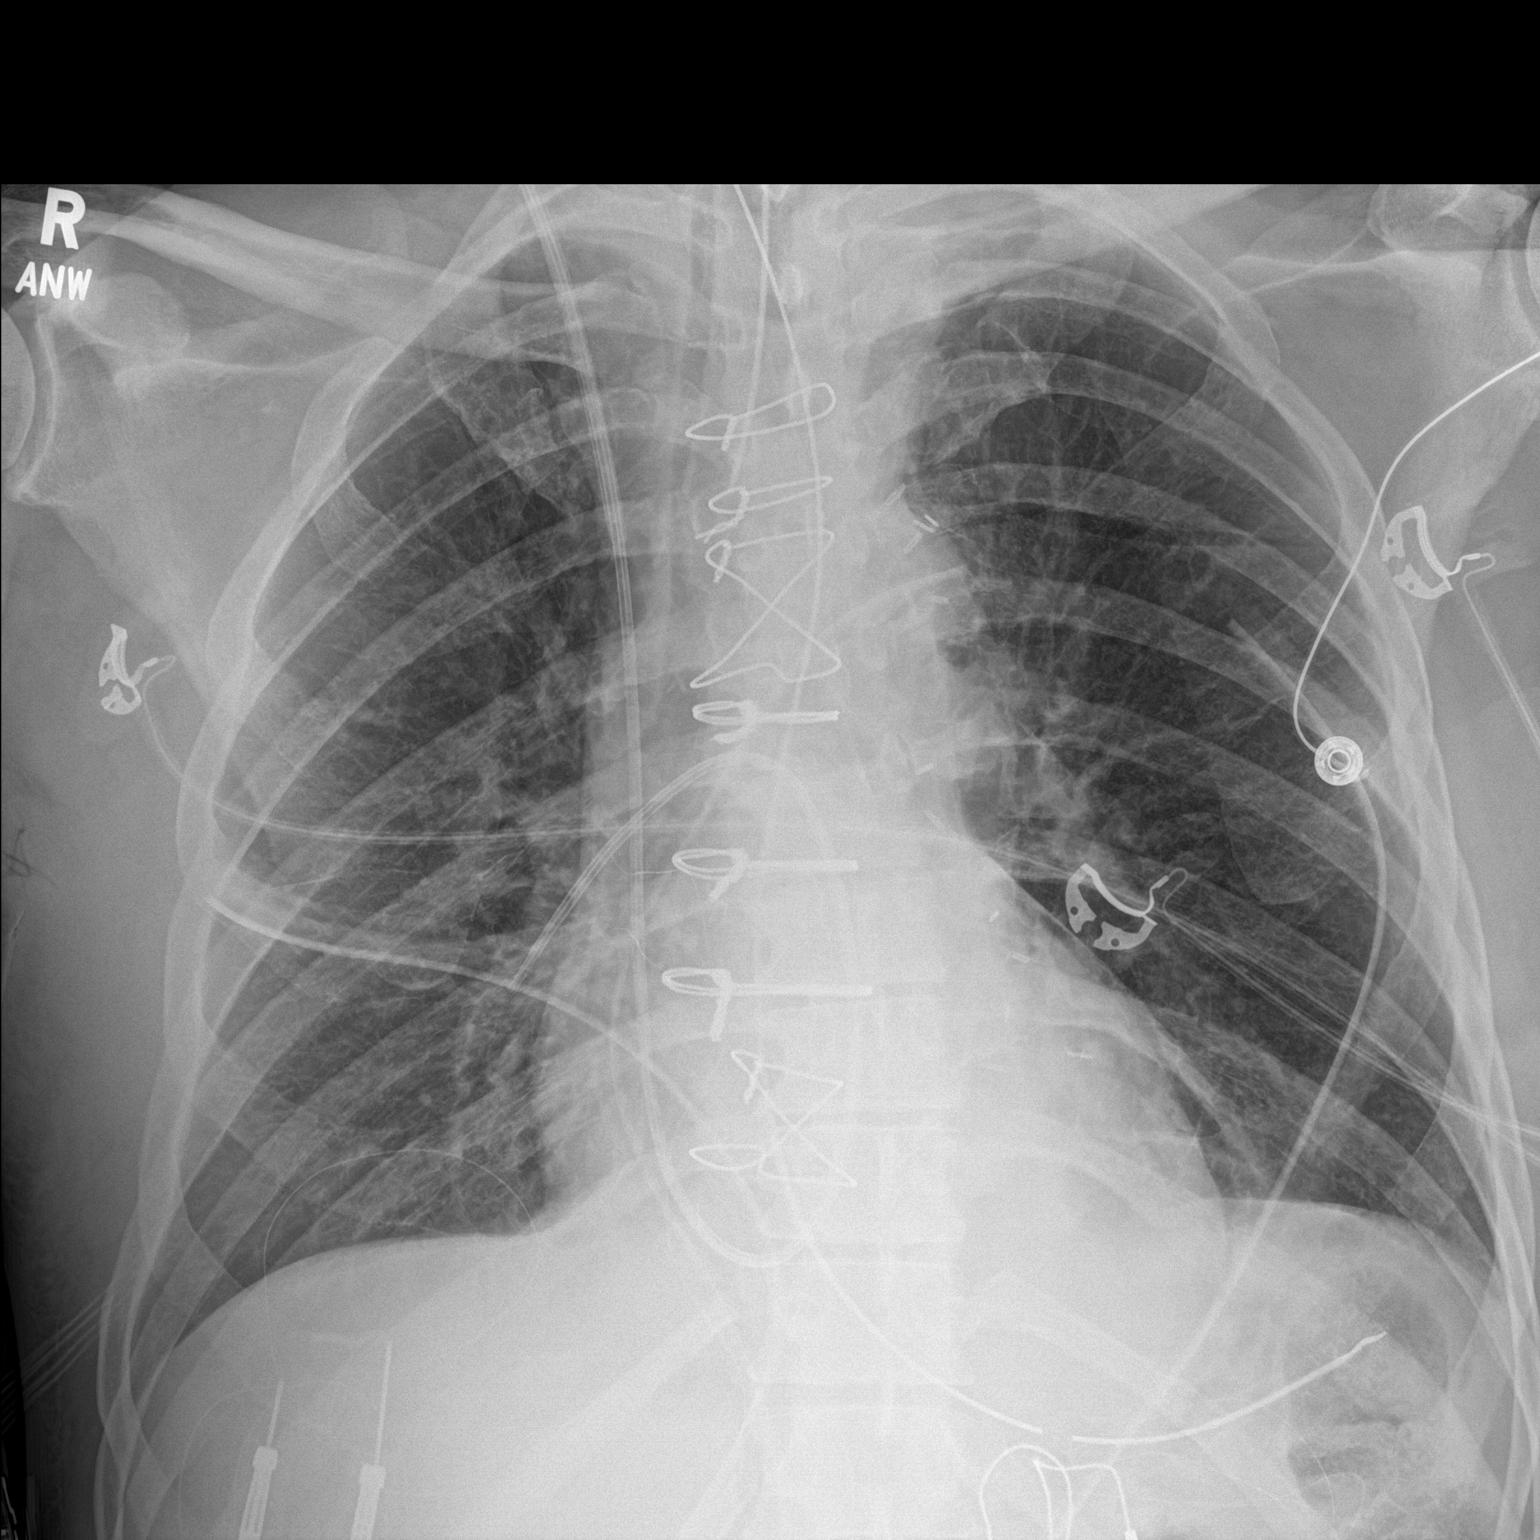

[1 of 1 positions shown; findings below may reference images not displayed]

FINDINGS: Endotracheal tube tip is 4.5 cm above the carina. Enteric tube
terminates in the gastric fundus. Intact sternotomy wires. CABG
clips overlie the mediastinum. Right internal jugular Swan-Ganz
catheter terminates over the right infrahilar region. Mediastinal
drain and bilateral chest tubes in place. Stable cardiomediastinal
silhouette with normal heart size. No pneumothorax. No pleural
effusion. Mild medial bibasilar atelectasis. No pulmonary edema.
IMPRESSION: 1. Right internal jugular Swan-Ganz catheter terminates over the
right infrahilar region, consider 3-4 cm retraction.
2. Otherwise well-positioned support structures.  No pneumothorax.
3. Mild medial bibasilar atelectasis.

## 2022-07-20 IMAGING — DX DG CHEST 2V
2 series · 2 of 2 positions shown · non-contrast
Comparison: Chest x-ray from yesterday.

CLINICAL DATA: Recent CABG.

EXAM:
CHEST - 2 VIEW

[chest pa]
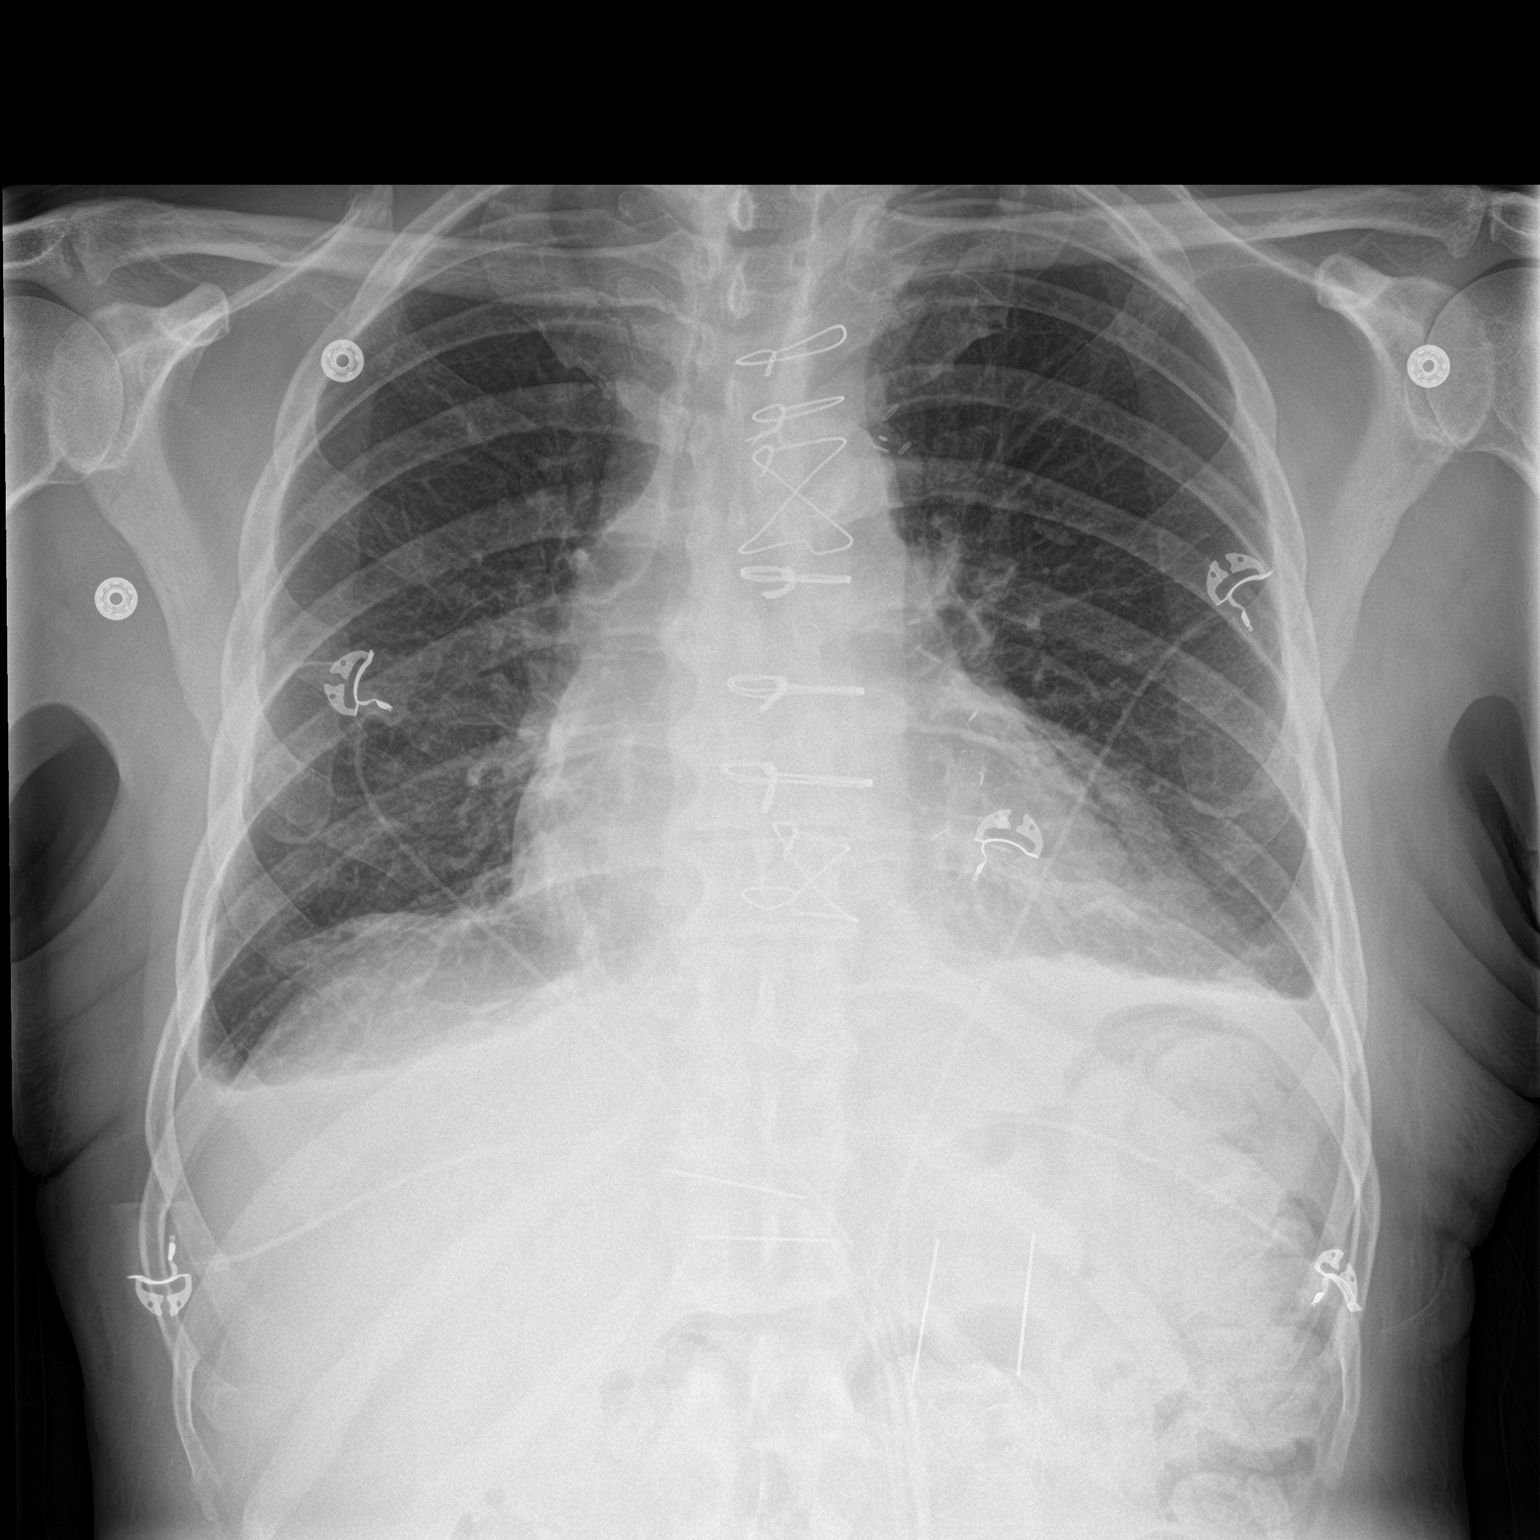

[chest lat]
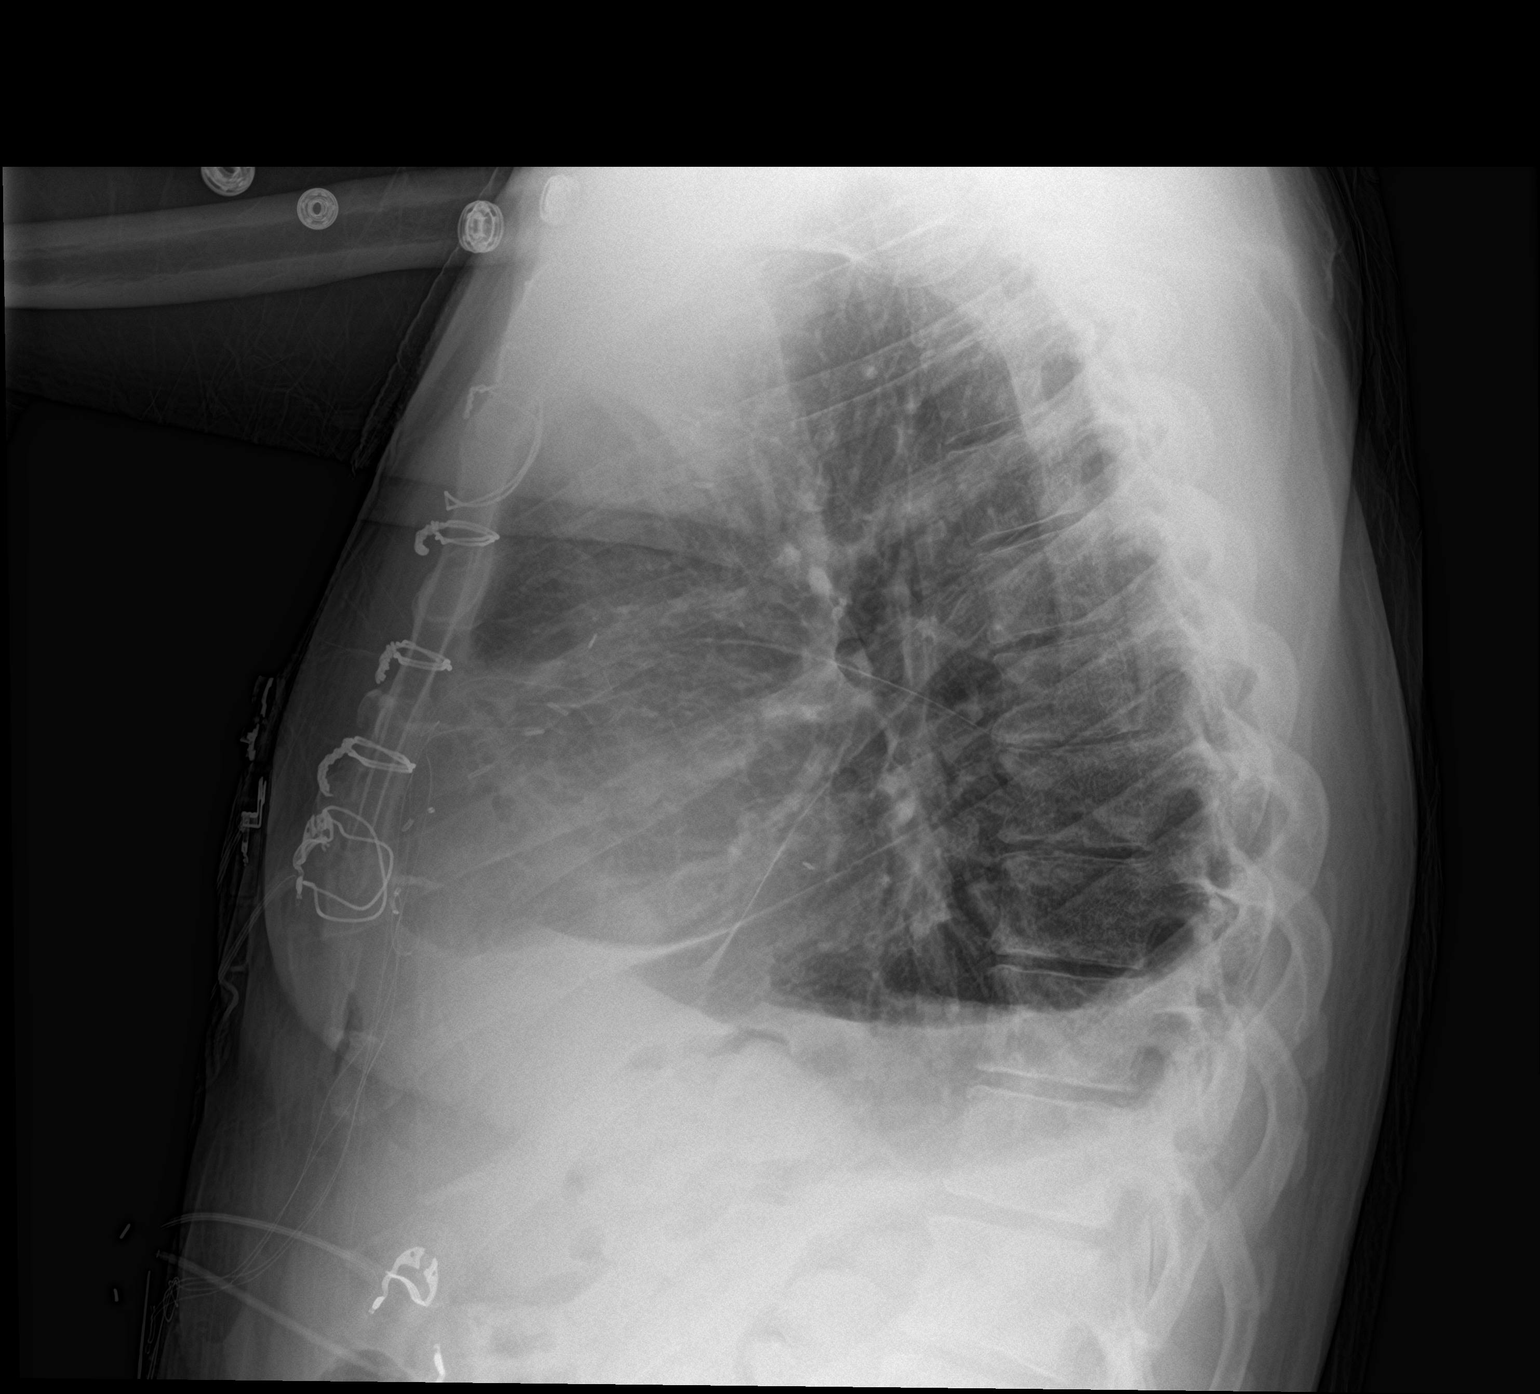

[2 of 2 positions shown; findings below may reference images not displayed]

FINDINGS: Interval removal of the bilateral chest tubes, mediastinal drain,
and right internal jugular sheath. Unchanged mild cardiomegaly
status post CABG. Normal pulmonary vascularity. Unchanged small
bilateral pleural effusions and mild bibasilar atelectasis. Trace
left apical pneumothorax. No acute osseous abnormality.
IMPRESSION: 1. Trace left apical pneumothorax status post removal of the
bilateral chest tubes.
2. Unchanged small bilateral pleural effusions and bibasilar
atelectasis.

## 2022-07-22 ENCOUNTER — Encounter: Payer: Self-pay | Admitting: Family

## 2022-07-22 ENCOUNTER — Ambulatory Visit (INDEPENDENT_AMBULATORY_CARE_PROVIDER_SITE_OTHER): Payer: 59 | Admitting: Family

## 2022-07-22 DIAGNOSIS — L97521 Non-pressure chronic ulcer of other part of left foot limited to breakdown of skin: Secondary | ICD-10-CM

## 2022-07-22 DIAGNOSIS — S90852D Superficial foreign body, left foot, subsequent encounter: Secondary | ICD-10-CM

## 2022-07-22 NOTE — Progress Notes (Signed)
Post-Op Visit Note   Patient: Curtis Watkins           Date of Birth: 04/01/1955           MRN: [id number] Visit Date: 07/22/2022 PCP: Rema Fendt, NP  Chief Complaint:  Chief Complaint  Patient presents with   Left Foot - Routine Post Op     07/01/2022 left foot debridement and removal foreign body new vac sponge       HPI:  HPI The patient is a 67 year old gentleman seen status post left foot debridement removal of foreign body with the VAC applied. Has been doing daily dial soap cleansing and dry dressings.  Ortho Exam On examination of the left foot Wagner grade 1 ulcer beneath the first metatarsal head this is 3 cm in diameter there is nonviable hyperkeratotic tissue surrounding this was debrided with a 10 blade knife back to viable tissue there is 5 mm of depth no sign of infection no erythema no drainage. On examination of the left heel the surgical site is 3 cm x 2 cm the distal aspect is 1 cm in depth there is granulation in the wound bed this does not probe to bone there is no active drainage surrounding erythema or maceration  Visit Diagnoses: No diagnosis found.  Plan: Begin daily Dial soap cleansing.  Pack open with silver cell.  Continue doxycycline.  Dry dressings.  Follow-Up Instructions: No follow-ups on file.   Imaging: No results found.  Orders:  No orders of the defined types were placed in this encounter.  No orders of the defined types were placed in this encounter.    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   Coronary artery disease involving native coronary artery of native heart with unstable angina pectoris (HCC)    Acute congestive heart failure (HCC) 03/27/2020   Diabetic foot ulcers (HCC) 03/27/2020   DM2 (diabetes mellitus, type 2) (HCC) 03/27/2020   HTN (hypertension) 03/27/2020   Past Medical History:  Diagnosis Date   Blind right eye    Carotid artery disease (HCC)    04/01/20: 60-79% RICA stenosis, 1-39% LICA stenosis   Chronic kidney disease    ckd stage 3 a per dr Ronald Lobo 02-05-2021 epic   Coronary artery disease    NSTEMI s/p CABG in 03/2020 (LIMA-->LAD/diag, L radial-->OM1/OM3); b.   DM type 2    Glaucoma of right eye, unspecified glaucoma type    HFrEF (heart failure with reduced ejection fraction) (HCC)    Echo 6/22: EF 45, global HK, mild MR, RVSP 31.9   Hypertension    Ischemic cardiomyopathy    PAD (peripheral artery disease) (HCC)    occluded right axillary artery 03/29/20     Family History  Problem Relation Age of Onset   Diabetes Mother     Past Surgical History:  Procedure Laterality Date   AMPUTATION Right 04/24/2020   Procedure: RIGHT 2ND AND 3RD TOE AMPUTATION;  Surgeon: Nadara Mustard, MD;  Location: [facility] OR;  Service: Orthopedics;  Laterality: Right;   AMPUTATION Right 10/22/2021   Procedure: RIGHT TRANSMETATARSAL AMPUTATION;  Surgeon: Nadara Mustard, MD;  Location: [facility] OR;  Service: Orthopedics;  Laterality: Right;   APPLICATION OF WOUND VAC Right 10/22/2021   Procedure: APPLICATION OF WOUND VAC;  Surgeon: Nadara Mustard, MD;  Location: [facility] OR;  Service: Orthopedics;  Laterality: Right;   CENTRAL VENOUS CATHETER INSERTION Left 04/03/2020   Procedure: INSERTION OF FEMORAL ARTERIAL LINE;  Surgeon: Linden Dolin, MD;  Location: [facility] OR;  Service: Open Heart Surgery;  Laterality: Left;   CORONARY ARTERY BYPASS GRAFT N/A 04/03/2020   Procedure: CORONARY ARTERY BYPASS GRAFTING  (CABG) TIMES FOUR USING LEFT INTERNAL MAMMARY ARTERY AND LEFT RADIAL ARTERY.;  Surgeon: Linden Dolin, MD;  Location: [facility] OR;  Service: Open Heart Surgery;  Laterality: N/A;   CORONARY PRESSURE/FFR WITH 3D MAPPING N/A 03/29/2020   Procedure: Coronary Pressure Wire/FFR w/3D Mapping;  Surgeon: Lyn Records, MD;  Location: [facility] INVASIVE CV LAB;  Service: Cardiovascular;  Laterality: N/A;   EYE SURGERY Right 01/21/2021   right eye cataract lens replacement surgery   I & D EXTREMITY Left 07/01/2022   Procedure: LEFT FOOT DEBRIDEMENT AND FOREIGN BODY REMOVAL;  Surgeon: Nadara Mustard, MD;  Location: [facility] OR;  Service: Orthopedics;  Laterality: Left;   IR THORACENTESIS ASP PLEURAL SPACE W/IMG GUIDE  05/03/2020   IR THORACENTESIS ASP PLEURAL SPACE W/IMG GUIDE  05/16/2020   LEFT HEART CATH AND CORONARY ANGIOGRAPHY N/A 03/29/2020   Procedure: LEFT HEART CATH AND CORONARY ANGIOGRAPHY;  Surgeon: Lyn Records, MD;  Location: [facility] INVASIVE CV LAB;  Service: Cardiovascular;  Laterality: N/A;   PENILE PROSTHESIS IMPLANT N/A 03/03/2021   Procedure: PENILE PROTHESIS INFLATABLE COLOPLAST;  Surgeon: Crista Elliot, MD;  Location: [facility] [place];  Service: Urology;  Laterality: N/A;  REQUESTING 2 HRS   RADIAL ARTERY HARVEST Left 04/03/2020   Procedure: LEFT RADIAL ARTERY HARVEST;  Surgeon: Linden Dolin, MD;  Location: [facility] OR;  Service: Open Heart Surgery;  Laterality: Left;   TEE WITHOUT CARDIOVERSION N/A 04/03/2020   Procedure: TRANSESOPHAGEAL ECHOCARDIOGRAM (TEE);  Surgeon: Linden Dolin, MD;  Location: [facility] OR;  Service: Open Heart Surgery;  Laterality: N/A;   UPPER EXTREMITY ANGIOGRAPHY  03/29/2020   Procedure: Upper Extremity Angiography;  Surgeon: Lyn Records, MD;  Location: [facility] INVASIVE CV LAB;  Service: Cardiovascular;;  rt.  arm   Social History   Occupational History   Occupation: retired d/t medical conditions  Tobacco Use   Smoking status: Every Day    Packs/day: 1.00    Years:  0.50    Additional pack years: 0.00    Total pack years: 0.50    Types: Cigarettes    Passive exposure: Current   Smokeless tobacco: Never  Vaping Use   Vaping Use: Never used  Substance and Sexual Activity   Alcohol use: Not Currently    Alcohol/week: 1.0 standard drink of alcohol    Types: 1 Cans of beer per week    Comment: occassionally beer   Drug use: Not Currently    Types: Marijuana    Comment: marijuana last use 3 xweek last used 02-23-2021   Sexual activity: Not Currently

## 2022-07-24 ENCOUNTER — Ambulatory Visit
Admission: RE | Admit: 2022-07-24 | Discharge: 2022-07-24 | Disposition: A | Discharge status: Home / Self Care | Payer: 59 | Source: Ambulatory Visit | Attending: Nephrology | Admitting: Nephrology

## 2022-07-24 DIAGNOSIS — N189 Chronic kidney disease, unspecified: Secondary | ICD-10-CM | POA: Diagnosis not present

## 2022-07-24 DIAGNOSIS — N1831 Chronic kidney disease, stage 3a: Secondary | ICD-10-CM

## 2022-07-28 ENCOUNTER — Encounter: Payer: Self-pay | Admitting: Internal Medicine

## 2022-07-28 ENCOUNTER — Other Ambulatory Visit (HOSPITAL_COMMUNITY): Payer: Self-pay

## 2022-07-28 ENCOUNTER — Ambulatory Visit (INDEPENDENT_AMBULATORY_CARE_PROVIDER_SITE_OTHER): Payer: 59 | Admitting: Internal Medicine

## 2022-07-28 VITALS — BP 112/68 | HR 80 | Ht 70.0 in | Wt 163.0 lb

## 2022-07-28 DIAGNOSIS — E1142 Type 2 diabetes mellitus with diabetic polyneuropathy: Secondary | ICD-10-CM | POA: Diagnosis not present

## 2022-07-28 DIAGNOSIS — Z7984 Long term (current) use of oral hypoglycemic drugs: Secondary | ICD-10-CM | POA: Diagnosis not present

## 2022-07-28 DIAGNOSIS — E1122 Type 2 diabetes mellitus with diabetic chronic kidney disease: Secondary | ICD-10-CM

## 2022-07-28 DIAGNOSIS — E113212 Type 2 diabetes mellitus with mild nonproliferative diabetic retinopathy with macular edema, left eye: Secondary | ICD-10-CM

## 2022-07-28 DIAGNOSIS — E1100 Type 2 diabetes mellitus with hyperosmolarity without nonketotic hyperglycemic-hyperosmolar coma (NKHHC): Secondary | ICD-10-CM

## 2022-07-28 DIAGNOSIS — N1831 Chronic kidney disease, stage 3a: Secondary | ICD-10-CM | POA: Diagnosis not present

## 2022-07-28 LAB — POCT GLYCOSYLATED HEMOGLOBIN (HGB A1C): Hemoglobin A1C: 7.6 % — AB (ref 4.0–5.6)

## 2022-07-28 LAB — POCT GLUCOSE (DEVICE FOR HOME USE): Glucose Fasting, POC: 175 mg/dL — AB (ref 70–99)

## 2022-07-28 MED ORDER — GLIMEPIRIDE 1 MG PO TABS
1.0000 mg | ORAL_TABLET | Freq: Every day | ORAL | 3 refills | Status: DC
  Filled 2022-07-28: qty 90, 90d supply, fill #0

## 2022-07-28 MED ORDER — EMPAGLIFLOZIN 25 MG PO TABS
25.0000 mg | ORAL_TABLET | Freq: Every day | ORAL | 3 refills | Status: DC
  Filled 2022-07-28 – 2022-10-06 (×2): qty 90, 90d supply, fill #0
  Filled 2023-01-07: qty 90, 90d supply, fill #1

## 2022-07-28 NOTE — Progress Notes (Signed)
Name: Curtis Watkins  Age/ Sex: 67 y.o., male   MRN/ DOB: [id number], May 26, 1955     PCP: Rema Fendt, NP   Reason for Endocrinology Evaluation: Type 2 Diabetes Mellitus  Initial Endocrine Consultative Visit: 11/01/2020    PATIENT IDENTIFIER: [name] is a 67 y.o. male with a past medical history of DM, CAD, CHF, dyslipidemia, and HTN. The patient has followed with Endocrinology clinic since 11/01/2020 for consultative assistance with management of his diabetes.  DIABETIC HISTORY:  Curtis Watkins was diagnosed with DM 2020, he has not been on insulin. His hemoglobin A1c has ranged from 6.7% in 2022, peaking at 8.1% in 2022.  DKA: never Pancreatitis: never   He was followed by Curtis Watkins in 2022 and was lost to follow-up until his return to our clinic in 07/2022, upon then his A1c was 7.6% he was on Jardiance only, he had stopped Ozempic due to weight loss  Started glimepiride 07/2022  SUBJECTIVE:   During the last visit (2022): saw Curtis Watkins   Today (07/28/2022): Curtis Watkins  He checks his blood sugars occasionally  times daily. The patient has not  had hypoglycemic episodes since the last clinic visit.     Pt follows with ortho for diabetic ulcer 06/2022  Denies nausea or vomiting  Denies constipation or diarrhea    HOME DIABETES REGIMEN:  Jardiance 25 mg daily     Statin: Yes ACE-I/ARB: Yes    METER DOWNLOAD SUMMARY: n/a    DIABETIC COMPLICATIONS: Microvascular complications:  CKD, neuropathy, amputation, blind right eye. NPDR with macular edemaon the left  Denies:  Last Eye Exam: Completed 12/01/2021  Macrovascular complications:  CAD Denies: CVA, PVD   HISTORY:  Past Medical History:  Past Medical History:  Diagnosis Date   Blind right eye    Carotid artery disease (HCC)    04/01/20: 60-79% RICA stenosis, 1-39% LICA stenosis   Chronic kidney disease    ckd stage 3 a per dr Ronald Lobo 02-05-2021 epic   Coronary artery disease    NSTEMI s/p  CABG in 03/2020 (LIMA-->LAD/diag, L radial-->OM1/OM3); b.   DM type 2    Glaucoma of right eye, unspecified glaucoma type    HFrEF (heart failure with reduced ejection fraction) (HCC)    Echo 6/22: EF 45, global HK, mild MR, RVSP 31.9   Hypertension    Ischemic cardiomyopathy    PAD (peripheral artery disease) (HCC)    occluded right axillary artery 03/29/20    Past Surgical History:  Past Surgical History:  Procedure Laterality Date   AMPUTATION Right 04/24/2020   Procedure: RIGHT 2ND AND 3RD TOE AMPUTATION;  Surgeon: Nadara Mustard, MD;  [facility];  Service: Orthopedics;  Laterality: Right;   AMPUTATION Right 10/22/2021   Procedure: RIGHT TRANSMETATARSAL AMPUTATION;  Surgeon: Nadara Mustard, MD;  [facility];  Service: Orthopedics;  Laterality: Right;   APPLICATION OF WOUND VAC Right 10/22/2021   Procedure: APPLICATION OF WOUND VAC;  Surgeon: Nadara Mustard, MD;  [facility];  Service: Orthopedics;  Laterality: Right;   CENTRAL VENOUS CATHETER INSERTION Left 04/03/2020   Procedure: INSERTION OF FEMORAL ARTERIAL LINE;  Surgeon: Linden Dolin, MD;  [facility];  Service: Open Heart Surgery;  Laterality: Left;   CORONARY ARTERY BYPASS GRAFT N/A 04/03/2020   Procedure: CORONARY ARTERY BYPASS GRAFTING (CABG) TIMES FOUR USING LEFT INTERNAL MAMMARY ARTERY AND LEFT RADIAL ARTERY.;  Surgeon: Linden Dolin, MD;  [facility];  Service: Open Heart Surgery;  Laterality: N/A;   CORONARY PRESSURE/FFR WITH 3D MAPPING N/A 03/29/2020   Procedure: Coronary Pressure Wire/FFR w/3D Mapping;  Surgeon: Lyn Records, MD;  [facility] INV[facility] LAB;  Service: Cardiovascular;  Laterality: N/A;   EYE SURGERY Right 01/21/2021   right eye cataract lens replacement surgery   I & D EXTREMITY Left 07/01/2022   Procedure: LEFT FOOT DEBRIDEMENT AND FOREIGN BODY REMOVAL;  Surgeon: Nadara Mustard, MD;  Location: [facility] OR;  Service: Orthopedics;  Laterality: Left;   IR THORACENTESIS ASP PLEURAL  SPACE W/IMG GUIDE  05/03/2020   IR THORACENTESIS ASP PLEURAL SPACE W/IMG GUIDE  05/16/2020   LEFT HEART CATH AND CORONARY ANGIOGRAPHY N/A 03/29/2020   Procedure: LEFT HEART CATH AND CORONARY ANGIOGRAPHY;  Surgeon: Lyn Records, MD;  [facility];  Service: Cardiovascular;  Laterality: N/A;   PENILE PROSTHESIS IMPLANT N/A 03/03/2021   Procedure: PENILE PROTHESIS INFLATABLE COLOPLAST;  Surgeon: Crista Elliot, MD;  Location[facility];  Service: Urology;  Laterality: N/A;  REQUESTING 2 HRS   RADIAL ARTERY HARVEST Left 04/03/2020   Procedure: LEFT RADIAL ARTERY HARVEST;  Surgeon: Linden Dolin, MD;  [facility];  Service: Open Heart Surgery;  Laterality: Left;   TEE WITHOUT CARDIOVERSION N/A 04/03/2020   Procedure: TRANSESOPHAGEAL ECHOCARDIOGRAM (TEE);  Surgeon: Linden Dolin, MD;  Location: [facility] OR;  Service: Open Heart Surgery;  Laterality: N/A;   UPPER EXTREMITY ANGIOGRAPHY  03/29/2020   Procedure: Upper Extremity Angiography;  Surgeon: Lyn Records, MD;  Location: [facility] INVASIVE CV LAB;  Service: Cardiovascular;;  rt.  arm   Social History:  reports that he has been smoking cigarettes. He has a 0.50 pack-year smoking history. He has been exposed to tobacco smoke. He has never used smokeless tobacco. He reports that he does not currently use alcohol after a past usage of about 1.0 standard drink of alcohol per week. He reports that he does not currently use drugs after having used the following drugs: Marijuana. Family History:  Family History  Problem Relation Age of Onset   Diabetes Mother      HOME MEDICATIONS: Allergies as of 07/28/2022   No Known Allergies      Medication List        Accurate as of July 28, 2022  2:49 PM. If you have any questions, ask your nurse or doctor.          aspirin 325 MG tablet Take 162 mg by mouth in the morning.   atorvastatin 80 MG tablet Commonly known as: LIPITOR Take 1 tablet (80 mg total) by  mouth daily.   blood glucose meter kit and supplies by Other route as directed. Dispense based on patient and insurance preference. Use up to 2x times daily as directed. (FOR ICD-10 E10.9, E11.9).   brimonidine 0.2 % ophthalmic solution Commonly known as: ALPHAGAN Place 1 drop into the left eye 2 (two) times daily.   ciprofloxacin 500 MG tablet Commonly known as: CIPRO Take 1 tablet (500 mg total) by mouth 2 (two) times daily.   dorzolamide-timolol 2-0.5 % ophthalmic solution Commonly known as: COSOPT Place 1 drop into the left eye 2 (two) times daily.   dorzolamide-timolol 2-0.5 % ophthalmic solution Commonly known as: COSOPT Place 1 drop into the left eye 2 (two) times daily.   doxycycline 100 MG tablet Commonly known as: VIBRA-TABS Take 1 tablet (100 mg total) by mouth 2 (two) times daily.  empagliflozin 25 MG Tabs tablet Commonly known as: JARDIANCE Take 1 tablet (25 mg total) by mouth daily before breakfast.   furosemide 20 MG tablet Commonly known as: LASIX Take 1 tablet (20 mg total) by mouth 3 (three) times a week. Take on Mondays, Wednesdays, and Fridays.   glimepiride 1 MG tablet Commonly known as: AMARYL Take 1 tablet (1 mg total) by mouth daily before breakfast. Started by: Curtis Shorts, MD   losartan 25 MG tablet Commonly known as: COZAAR Take 1/2 tablet (12.5 mg total) by mouth daily.   metoprolol succinate 25 MG 24 hr tablet Commonly known as: Toprol XL Take 1/2 tablet (12.5 mg total) by mouth daily.   oxyCODONE-acetaminophen 5-325 MG tablet Commonly known as: PERCOCET/ROXICET Take 1 tablet by mouth every 4 (four) hours as needed.   spironolactone 25 MG tablet Commonly known as: ALDACTONE Take 1/2 tablet (12.5 mg total) by mouth daily.         OBJECTIVE:   Vital Signs: BP 112/68 (BP Location: Left Arm, Patient Position: Sitting, Cuff Size: Small)   Pulse 80   Ht 5\' 10"  (1.778 m)   Wt 163 lb (73.9 kg)   SpO2 99%   BMI 23.39  kg/m   Wt Readings from Last 3 Encounters:  07/28/22 163 lb (73.9 kg)  07/14/22 160 lb 12.8 oz (72.9 kg)  07/01/22 155 lb (70.3 kg)     Exam: General: Pt appears well and is in NAD  Neck: General: Supple without adenopathy. Thyroid: Thyroid size normal.  No goiter or nodules appreciated.   Lungs: Clear with good BS bilat   Heart: RRR   Extremities: No pretibial edema.   Neuro: MS is good with appropriate affect, pt is alert and Ox3    DM foot exam: 07/28/2022  Left Boot in place  Right forefoot amputation noted  The pedal pulses are 2+ on the right  The sensation is decreased to a screening 5.07, 10 gram monofilament on the right         DATA REVIEWED:  Lab Results  Component Value Date   HGBA1C 7.6 (A) 07/28/2022   HGBA1C 6.9 (A) 11/28/2021   HGBA1C 6.7 (A) 02/07/2021    07/01/2022 Glucose 135 BUN 31 CR 1.71 GFR 44  11/28/2021 MA/CR ratio 233  BG 175 mg/dL    Old records , labs and images have been reviewed.    ASSESSMENT / PLAN / RECOMMENDATIONS:   1) Type 2 Diabetes Mellitus, Sub-optimally controlled, With neuropathic, CKD III, and macrovascular complications - Most recent A1c of 7.6 %. Goal A1c < 7.0 %.     -Patient on Jardiance only, A1c above goal -He stopped Ozempic due to extreme weight loss -I have recommended starting glimepiride, emphasized importance of taking it 15-20 minutes before breakfast, discussed risk of hypoglycemia and to notify the office -Patient has dropped off forms for diabetic shoes somewhere or no charge trach and is wondering if I need to give him anything for that, I did explain to the patient that typically we receive forms that have to be signed and certified, he just need to make sure that he gives him my information so they will be able to fax it to [place]   MEDICATIONS: Start glimepiride 1 mg, 1 tablet before breakfast Continue Jardiance 25 mg, 1 tablet before breakfast  EDUCATION / INSTRUCTIONS: BG monitoring  instructions: Patient is instructed to check his blood sugars 1 times a day. Call [place] [facility] if: BG persistently < 70  I reviewed the Rule of 15 for the treatment of hypoglycemia in detail with the patient. Literature supplied.    2) Diabetic complications:  Eye: Does  have known diabetic retinopathy.  Neuro/ Feet: Does  have known diabetic peripheral neuropathy .  Renal: Patient does  have known baseline CKD.    2) Dyslipidemia :    - LDL at goal 35 mg/DL - Pt on atorvastatin 80 mg daily per cardiology    F/U in 6 months     I spent 27 minutes preparing to see the patient by review of recent labs, imaging and procedures, obtaining and reviewing separately obtained history, communicating with the patient, ordering medications, tests or procedures, and documenting clinical information in the EHR including the differential Dx, treatment, and any further evaluation and other management   Signed electronically by: Lyndle Herrlich, MD  [facility]  [facility] [address] [place] [place], [place] [address] Phone: [phone] FAX: [phone]   CC: Rema Fendt, NP [address] [place] [address] Phone: [phone]  Fax: [phone]  Return to Endocrinology clinic as below: Future Appointments  Date Time Provider Department Center  07/31/2022 11:00 AM [facility] 2 [facility]  08/05/2022  9:00 AM Adonis Huguenin, NP [facility] None  01/28/2023 10:30 AM Keijuan Schellhase, Konrad Dolores, MD [facility] None

## 2022-07-28 NOTE — Patient Instructions (Signed)
Start Glimepiride 1 mg, 1 tablet before breakfast  Continue Jardiance 25 mg , 1 tablet before Breakfast      HOW TO TREAT LOW BLOOD SUGARS (Blood sugar LESS THAN 70 MG/DL) Please follow the RULE OF 15 for the treatment of hypoglycemia treatment (when your (blood sugars are less than 70 mg/dL)   STEP 1: Take 15 grams of carbohydrates when your blood sugar is low, which includes:  3-4 GLUCOSE TABS  OR 3-4 OZ OF JUICE OR REGULAR SODA OR ONE TUBE OF GLUCOSE GEL    STEP 2: RECHECK blood sugar in 15 MINUTES STEP 3: If your blood sugar is still low at the 15 minute recheck --> then, go back to STEP 1 and treat AGAIN with another 15 grams of carbohydrates.

## 2022-07-31 ENCOUNTER — Ambulatory Visit (HOSPITAL_COMMUNITY)
Admission: RE | Admit: 2022-07-31 | Discharge: 2022-07-31 | Disposition: A | Discharge status: Home / Self Care | Payer: 59 | Source: Ambulatory Visit | Attending: Cardiology | Admitting: Cardiology

## 2022-07-31 DIAGNOSIS — I779 Disorder of arteries and arterioles, unspecified: Secondary | ICD-10-CM

## 2022-07-31 DIAGNOSIS — R0989 Other specified symptoms and signs involving the circulatory and respiratory systems: Secondary | ICD-10-CM

## 2022-08-03 ENCOUNTER — Other Ambulatory Visit (HOSPITAL_COMMUNITY): Payer: Self-pay

## 2022-08-03 ENCOUNTER — Telehealth: Payer: Self-pay | Admitting: *Deleted

## 2022-08-03 DIAGNOSIS — R0989 Other specified symptoms and signs involving the circulatory and respiratory systems: Secondary | ICD-10-CM

## 2022-08-03 DIAGNOSIS — I779 Disorder of arteries and arterioles, unspecified: Secondary | ICD-10-CM

## 2022-08-03 NOTE — Telephone Encounter (Signed)
The patient has been notified of the result and verbalized understanding.  All questions (if any) were answered.  Pt aware that we will order for him to get repeat carotids done in one year.  He is aware that I will place the order in the system and send a message to our PV Scheduler to call him back to arrange this appt.   Pt verbalized understanding and agrees with this plan.

## 2022-08-03 NOTE — Telephone Encounter (Signed)
-----   Message from Meriam Sprague, MD sent at 08/02/2022  8:22 PM EDT ----- His right carotid shows 60-79% narrowing and there is 1-39% narrowing on the left. Will continue ASA and statin. Plan for repeat ultrasound in 1 year.

## 2022-08-05 ENCOUNTER — Ambulatory Visit (INDEPENDENT_AMBULATORY_CARE_PROVIDER_SITE_OTHER): Payer: 59 | Admitting: Family

## 2022-08-05 ENCOUNTER — Encounter: Payer: Self-pay | Admitting: Family

## 2022-08-05 DIAGNOSIS — S90852D Superficial foreign body, left foot, subsequent encounter: Secondary | ICD-10-CM

## 2022-08-05 DIAGNOSIS — L97401 Non-pressure chronic ulcer of unspecified heel and midfoot limited to breakdown of skin: Secondary | ICD-10-CM | POA: Diagnosis not present

## 2022-08-05 DIAGNOSIS — L97521 Non-pressure chronic ulcer of other part of left foot limited to breakdown of skin: Secondary | ICD-10-CM | POA: Diagnosis not present

## 2022-08-05 DIAGNOSIS — E08621 Diabetes mellitus due to underlying condition with foot ulcer: Secondary | ICD-10-CM

## 2022-08-05 NOTE — Progress Notes (Signed)
Office Visit Note   Patient: Curtis Watkins           Date of Birth: 09-Feb-1956           MRN: [id number] Visit Date: 08/05/2022              Requested by: Rema Fendt, NP [address] [place],  [place] [address] PCP: Rema Fendt, NP  Chief Complaint  Patient presents with   Left Foot - Routine Post Op    07/01/2022 left foot debridement and removal foreign body new vac sponge          HPI: The patient is a 67 year old gentleman who is seen status post debridement of the left foot with removal of foreign body from the lateral aspect of his heel.  He has been in a postop shoe  Assessment & Plan: Visit Diagnoses: No diagnosis found.  Plan: Continue protected weightbearing daily dose of cleansing pack the lateral wound as well as his first metatarsal head ulcer open with silver cell minimize weightbearing call for any worsening  Follow-Up Instructions: Return in about 2 weeks (around 08/19/2022).   Ortho Exam  Patient is alert, oriented, no adenopathy, well-dressed, normal affect, normal respiratory effort. On examination of the left foot the lateral aspect of his heel with the surgical opening this is 3 cm x 2 cm filled in with flat pink tissue there is some undermining distally this undermines about 1 cm there is 1 drop of serous drainage.  To the first metatarsal head plantar aspect he has a Wagner grade 1 ulcer that is 3 cm in diameter debrided of nonviable tissue with a 10 blade knife after informed consent back to viable tissue there is scant surrounding maceration there is granulation with scant bloody drainage.  There is also a Wagner grade 1 ulcer to the third metatarsal head this was debrided with 10 blade and back to viable tissue of hyperkeratotic tissue there is no bleeding no open area  Imaging: No results found.    Labs: Lab Results  Component Value Date   HGBA1C 7.6 (A) 07/28/2022   HGBA1C 6.9 (A) 11/28/2021   HGBA1C 6.7 (A) 02/07/2021    REPTSTATUS 07/06/2022 FINAL 07/01/2022   GRAMSTAIN  07/01/2022    RARE WBC PRESENT,BOTH PMN AND MONONUCLEAR FEW GRAM POSITIVE COCCI IN PAIRS IN CLUSTERS    CULT  07/01/2022    ABUNDANT STAPHYLOCOCCUS EPIDERMIDIS NO ANAEROBES ISOLATED Performed at [facility], [address] [place], [place] [address]    [facility] STAPHYLOCOCCUS EPIDERMIDIS 07/01/2022     Lab Results  Component Value Date   ALBUMIN 4.0 08/02/2020    Lab Results  Component Value Date   MG 2.6 (H) 04/04/2020   MG 2.6 (H) 04/04/2020   MG 2.9 (H) 04/03/2020   No results found for: "VD25OH"  No results found for: "PREALBUMIN"    Latest Ref Rng & Units 07/01/2022    7:48 AM 03/06/2022    4:34 AM 03/05/2022    8:21 PM  CBC EXTENDED  WBC 4.0 - 10.5 K/uL 9.1  11.3  10.6   RBC 4.22 - 5.81 MIL/uL 4.02  4.32  3.80   Hemoglobin 13.0 - 17.0 g/dL 82.9  56.2  13.0   HCT 39.0 - 52.0 % 38.3  38.9  34.2   Platelets 150 - 400 K/uL 311  118  94      There is no height or weight on file to calculate BMI.  Orders:  No orders of the defined types were placed in this encounter.  No orders of the defined types were placed in this encounter.    Procedures: No procedures performed  Clinical Data: No additional findings.  ROS:  All other systems negative, except as noted in the HPI. Review of Systems  Objective: Vital Signs: There were no vitals taken for this visit.  Specialty Comments:  No specialty comments available.  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   Coronary artery disease involving native coronary artery of native heart with unstable angina pectoris (HCC)    Acute congestive heart failure (HCC) 03/27/2020   Diabetic foot ulcers (HCC) 03/27/2020   DM2 (diabetes mellitus, type 2) (HCC) 03/27/2020   HTN (hypertension) 03/27/2020   Past Medical History:  Diagnosis Date   Blind right eye    Carotid artery disease (HCC)    04/01/20: 60-79% RICA stenosis, 1-39% LICA stenosis   Chronic kidney disease    ckd stage 3 a per dr Ronald Lobo 02-05-2021 epic   Coronary artery disease    NSTEMI s/p CABG in 03/2020 (LIMA-->LAD/diag, L radial-->OM1/OM3); b.   DM type 2    Glaucoma of right eye, unspecified glaucoma type    HFrEF (heart failure with reduced ejection fraction) (HCC)    Echo 6/22: EF 45, global HK, mild MR, RVSP 31.9   Hypertension    Ischemic cardiomyopathy    PAD (peripheral artery disease) (HCC)    occluded right axillary artery 03/29/20     Family History  Problem Relation Age of Onset   Diabetes Mother     Past Surgical History:  Procedure Laterality Date   AMPUTATION Right 04/24/2020   Procedure: RIGHT 2ND AND 3RD TOE AMPUTATION;  Surgeon: Nadara Mustard, MD;  [facility];  Service: Orthopedics;  Laterality: Right;   AMPUTATION Right 10/22/2021   Procedure: RIGHT TRANSMETATARSAL AMPUTATION;  Surgeon: Nadara Mustard, MD;  Location: [facility] OR;  Service: Orthopedics;  Laterality: Right;   APPLICATION OF WOUND VAC Right 10/22/2021   Procedure: APPLICATION OF WOUND VAC;  Surgeon: Nadara Mustard, MD;  [facility];  Service: Orthopedics;  Laterality: Right;   CENTRAL VENOUS CATHETER INSERTION Left 04/03/2020   Procedure: INSERTION OF FEMORAL ARTERIAL LINE;  Surgeon: Linden Dolin, MD;  [facility];  Service: Open Heart Surgery;  Laterality: Left;   CORONARY ARTERY BYPASS GRAFT N/A 04/03/2020   Procedure: CORONARY ARTERY BYPASS GRAFTING (CABG) TIMES FOUR  USING LEFT INTERNAL MAMMARY ARTERY AND LEFT RADIAL ARTERY.;  Surgeon: Linden Dolin, MD;  [facility];  Service: Open Heart Surgery;  Laterality: N/A;   CORONARY PRESSURE/FFR WITH 3D MAPPING N/A 03/29/2020   Procedure: Coronary Pressure Wire/FFR w/3D Mapping;  Surgeon: Lyn Records, MD;  [facility] INVAS[facility] LAB;  Service: Cardiovascular;  Laterality: N/A;   EYE SURGERY Right 01/21/2021   right eye cataract lens replacement surgery   I & D EXTREMITY  Left 07/01/2022   Procedure: LEFT FOOT DEBRIDEMENT AND FOREIGN BODY REMOVAL;  Surgeon: Nadara Mustard, MD;  Location: [facility] OR;  Service: Orthopedics;  Laterality: Left;   IR THORACENTESIS ASP PLEURAL SPACE W/IMG GUIDE  05/03/2020   IR THORACENTESIS ASP PLEURAL SPACE W/IMG GUIDE  05/16/2020   LEFT HEART CATH AND CORONARY ANGIOGRAPHY N/A 03/29/2020   Procedure: LEFT HEART CATH AND CORONARY ANGIOGRAPHY;  Surgeon: Lyn Records, MD;  [facility] LAB;  Service: Cardiovascular;  Laterality: N/A;   PENILE PROSTHESIS IMPLANT N/A 03/03/2021   Procedure: PENILE PROTHESIS INFLATABLE COLOPLAST;  Surgeon: Crista Elliot, MD;  Location: [facility];  Service: Urology;  Laterality: N/A;  REQUESTING 2 HRS   RADIAL ARTERY HARVEST Left 04/03/2020   Procedure: LEFT RADIAL ARTERY HARVEST;  Surgeon: Linden Dolin, MD;  [facility];  Service: Open Heart Surgery;  Laterality: Left;   TEE WITHOUT CARDIOVERSION N/A 04/03/2020   Procedure: TRANSESOPHAGEAL ECHOCARDIOGRAM (TEE);  Surgeon: Linden Dolin, MD;  Location: [facility] OR;  Service: Open Heart Surgery;  Laterality: N/A;   UPPER EXTREMITY ANGIOGRAPHY  03/29/2020   Procedure: Upper Extremity Angiography;  Surgeon: Lyn Records, MD;  Location: [facility] INVASIVE CV LAB;  Service: Cardiovascular;;  rt.  arm   Social History   Occupational History   Occupation: retired d/t medical conditions  Tobacco Use   Smoking status: Every Day    Packs/day: 1.00    Years: 0.50     Additional pack years: 0.00    Total pack years: 0.50    Types: Cigarettes    Passive exposure: Current   Smokeless tobacco: Never  Vaping Use   Vaping Use: Never used  Substance and Sexual Activity   Alcohol use: Not Currently    Alcohol/week: 1.0 standard drink of alcohol    Types: 1 Cans of beer per week    Comment: occassionally beer   Drug use: Not Currently    Types: Marijuana    Comment: marijuana last use 3 xweek last used 02-23-2021   Sexual activity: Not Currently

## 2022-08-11 DIAGNOSIS — E113212 Type 2 diabetes mellitus with mild nonproliferative diabetic retinopathy with macular edema, left eye: Secondary | ICD-10-CM | POA: Diagnosis not present

## 2022-08-11 DIAGNOSIS — H2511 Age-related nuclear cataract, right eye: Secondary | ICD-10-CM | POA: Diagnosis not present

## 2022-08-11 DIAGNOSIS — Z794 Long term (current) use of insulin: Secondary | ICD-10-CM | POA: Diagnosis not present

## 2022-08-19 ENCOUNTER — Ambulatory Visit (INDEPENDENT_AMBULATORY_CARE_PROVIDER_SITE_OTHER): Payer: 59 | Admitting: Family

## 2022-08-19 ENCOUNTER — Other Ambulatory Visit (HOSPITAL_COMMUNITY): Payer: Self-pay

## 2022-08-19 ENCOUNTER — Encounter: Payer: Self-pay | Admitting: Family

## 2022-08-19 DIAGNOSIS — M86272 Subacute osteomyelitis, left ankle and foot: Secondary | ICD-10-CM

## 2022-08-19 DIAGNOSIS — S90852A Superficial foreign body, left foot, initial encounter: Secondary | ICD-10-CM

## 2022-08-19 MED ORDER — AMOXICILLIN-POT CLAVULANATE 500-125 MG PO TABS
1.0000 | ORAL_TABLET | Freq: Three times a day (TID) | ORAL | 0 refills | Status: DC
  Filled 2022-08-19: qty 45, 15d supply, fill #0

## 2022-08-19 NOTE — Progress Notes (Signed)
Office Visit Note   Patient: Curtis Watkins           Date of Birth: February 09, 1956           MRN: [id number] Visit Date: 08/19/2022              Requested by: Rema Fendt, NP [address] [place],  [place] [address] PCP: Rema Fendt, NP  Chief Complaint  Patient presents with   Left Foot - Routine Post Op    07/01/2022 left foot debridement and removal of FB      HPI: The patient is a 67 year old gentleman who is seen in follow-up status post removal of foreign body of the left foot on May 8. we have also been following Wagner grade 1 ulcer beneath the first metatarsal head  Assessment & Plan: Visit Diagnoses: No diagnosis found.  Plan: Osteomyelitis with bone exposure left foot will plan for first ray amputation.  Patient is in agreement with the plan.  Will place on a course of oral antibiotics call him to arrange surgery at next available.  Follow-Up Instructions: No follow-ups on file.   Ortho Exam  Patient is alert, oriented, no adenopathy, well-dressed, normal affect, normal respiratory effort. On examination of the left foot beneath the first metatarsal head there is open ulceration this is 3 cm in diameter unfortunately there is some extension of the ulcer of the medial column of his foot there is exposed bone in the wound bed necrotic tissue and remainder.  There is epiboly.  There is no purulence no cellulitis surrounding.  To the lateral foot over the area of surgical debridement this has significantly improved remaining open area 5 mm in diameter 3 mm deep there is no tracking today.  No drainage no erythema  Imaging: No results found. No images are attached to the encounter.  Labs: Lab Results  Component Value Date   HGBA1C 7.6 (A) 07/28/2022   HGBA1C 6.9 (A) 11/28/2021   HGBA1C 6.7 (A) 02/07/2021   REPTSTATUS 07/06/2022 FINAL 07/01/2022   GRAMSTAIN  07/01/2022    RARE WBC PRESENT,BOTH PMN AND MONONUCLEAR FEW GRAM POSITIVE COCCI IN PAIRS IN  CLUSTERS    CULT  07/01/2022    ABUNDANT STAPHYLOCOCCUS EPIDERMIDIS NO ANAEROBES ISOLATED Performed at [facility], [address], [place], [place] [address]    [facility] STAPHYLOCOCCUS EPIDERMIDIS 07/01/2022     Lab Results  Component Value Date   ALBUMIN 4.0 08/02/2020    Lab Results  Component Value Date   MG 2.6 (H) 04/04/2020   MG 2.6 (H) 04/04/2020   MG 2.9 (H) 04/03/2020   No results found for: "VD25OH"  No results found for: "PREALBUMIN"    Latest Ref Rng & Units 07/01/2022    7:48 AM 03/06/2022    4:34 AM 03/05/2022    8:21 PM  CBC EXTENDED  WBC 4.0 - 10.5 K/uL 9.1  11.3  10.6   RBC 4.22 - 5.81 MIL/uL 4.02  4.32  3.80   Hemoglobin 13.0 - 17.0 g/dL 82.9  56.2  13.0   HCT 39.0 - 52.0 % 38.3  38.9  34.2   Platelets 150 - 400 K/uL 311  118  94      There is no height or weight on file to calculate BMI.  Orders:  No orders of the defined types were placed in this encounter.  No orders of the defined types were placed in this encounter.    Procedures:  No procedures performed  Clinical Data: No additional findings.  ROS:  All other systems negative, except as noted in the HPI. Review of Systems  Objective: Vital Signs: There were no vitals taken for this visit.  Specialty Comments:  No specialty comments available.  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   Coronary artery disease involving native coronary artery of native heart with  unstable angina pectoris (HCC)    Acute congestive heart failure (HCC) 03/27/2020   Diabetic foot ulcers (HCC) 03/27/2020   DM2 (diabetes mellitus, type 2) (HCC) 03/27/2020   HTN (hypertension) 03/27/2020   Past Medical History:  Diagnosis Date   Blind right eye    Carotid artery disease (HCC)    04/01/20: 60-79% RICA stenosis, 1-39% LICA stenosis   Chronic kidney disease    ckd stage 3 a per dr Ronald Lobo 02-05-2021 epic   Coronary artery disease    NSTEMI s/p CABG in 03/2020 (LIMA-->LAD/diag, L radial-->OM1/OM3); b.   DM type 2    Glaucoma of right eye, unspecified glaucoma type    HFrEF (heart failure with reduced ejection fraction) (HCC)    Echo 6/22: EF 45, global HK, mild MR, RVSP 31.9   Hypertension    Ischemic cardiomyopathy    PAD (peripheral artery disease) (HCC)    occluded right axillary artery 03/29/20     Family History  Problem Relation Age of Onset   Diabetes Mother     Past Surgical History:  Procedure Laterality Date   AMPUTATION Right 04/24/2020   Procedure: RIGHT 2ND AND 3RD TOE AMPUTATION;  Surgeon: Nadara Mustard, MD;  Location: [facility] OR;  Service: Orthopedics;  Laterality: Right;   AMPUTATION Right 10/22/2021   Procedure: RIGHT TRANSMETATARSAL AMPUTATION;  Surgeon: Nadara Mustard, MD;  Location: [facility] OR;  Service: Orthopedics;  Laterality: Right;   APPLICATION OF WOUND VAC Right 10/22/2021   Procedure: APPLICATION OF WOUND VAC;  Surgeon: Nadara Mustard, MD;  Location: [facility] OR;  Service: Orthopedics;  Laterality: Right;   CENTRAL VENOUS CATHETER INSERTION Left 04/03/2020   Procedure: INSERTION OF FEMORAL ARTERIAL LINE;  Surgeon: Linden Dolin, MD;  Location: [facility] OR;  Service: Open Heart Surgery;  Laterality: Left;   CORONARY ARTERY BYPASS GRAFT N/A 04/03/2020   Procedure: CORONARY ARTERY BYPASS GRAFTING (CABG) TIMES FOUR USING LEFT INTERNAL MAMMARY ARTERY AND LEFT RADIAL ARTERY.;  Surgeon: Linden Dolin, MD;  Location: [facility] OR;  Service: Open Heart Surgery;   Laterality: N/A;   CORONARY PRESSURE/FFR WITH 3D MAPPING N/A 03/29/2020   Procedure: Coronary Pressure Wire/FFR w/3D Mapping;  Surgeon: Lyn Records, MD;  Location: [facility] INVASIVE CV LAB;  Service: Cardiovascular;  Laterality: N/A;   EYE SURGERY Right 01/21/2021   right eye cataract lens replacement surgery   I & D EXTREMITY Left 07/01/2022   Procedure: LEFT FOOT DEBRIDEMENT AND FOREIGN BODY REMOVAL;  Surgeon: Nadara Mustard, MD;  Location: [facility] OR;  Service: Orthopedics;  Laterality: Left;  IR THORACENTESIS ASP PLEURAL SPACE W/IMG GUIDE  05/03/2020   IR THORACENTESIS ASP PLEURAL SPACE W/IMG GUIDE  05/16/2020   LEFT HEART CATH AND CORONARY ANGIOGRAPHY N/A 03/29/2020   Procedure: LEFT HEART CATH AND CORONARY ANGIOGRAPHY;  Surgeon: Lyn Records, MD;  Location: [facility] INV[facility] LAB;  Service: Cardiovascular;  Laterality: N/A;   PENILE PROSTHESIS IMPLANT N/A 03/03/2021   Procedure: PENILE PROTHESIS INFLATABLE COLOPLAST;  Surgeon: Crista Elliot, MD;  Location: [facility] [place];  Service: Urology;  Laterality: N/A;  REQUESTING 2 HRS   RADIAL ARTERY HARVEST Left 04/03/2020   Procedure: LEFT RADIAL ARTERY HARVEST;  Surgeon: Linden Dolin, MD;  Location: [facility] OR;  Service: Open Heart Surgery;  Laterality: Left;   TEE WITHOUT CARDIOVERSION N/A 04/03/2020   Procedure: TRANSESOPHAGEAL ECHOCARDIOGRAM (TEE);  Surgeon: Linden Dolin, MD;  Location: [facility] OR;  Service: Open Heart Surgery;  Laterality: N/A;   UPPER EXTREMITY ANGIOGRAPHY  03/29/2020   Procedure: Upper Extremity Angiography;  Surgeon: Lyn Records, MD;  Location: [facility] INVASIVE CV LAB;  Service: Cardiovascular;;  rt.  arm   Social History   Occupational History   Occupation: retired d/t medical conditions  Tobacco Use   Smoking status: Every Day    Packs/day: 1.00    Years: 0.50    Additional pack years: 0.00    Total pack years: 0.50    Types: Cigarettes    Passive exposure: Current   Smokeless tobacco: Never  Vaping Use    Vaping Use: Never used  Substance and Sexual Activity   Alcohol use: Not Currently    Alcohol/week: 1.0 standard drink of alcohol    Types: 1 Cans of beer per week    Comment: occassionally beer   Drug use: Not Currently    Types: Marijuana    Comment: marijuana last use 3 xweek last used 02-23-2021   Sexual activity: Not Currently

## 2022-08-29 IMAGING — CR DG CHEST 1V
1 series · 1 of 1 positions shown · non-contrast
Comparison: May 03, 2020

CLINICAL DATA: Status post thoracentesis

EXAM:
CHEST  1 VIEW

[chest ap]
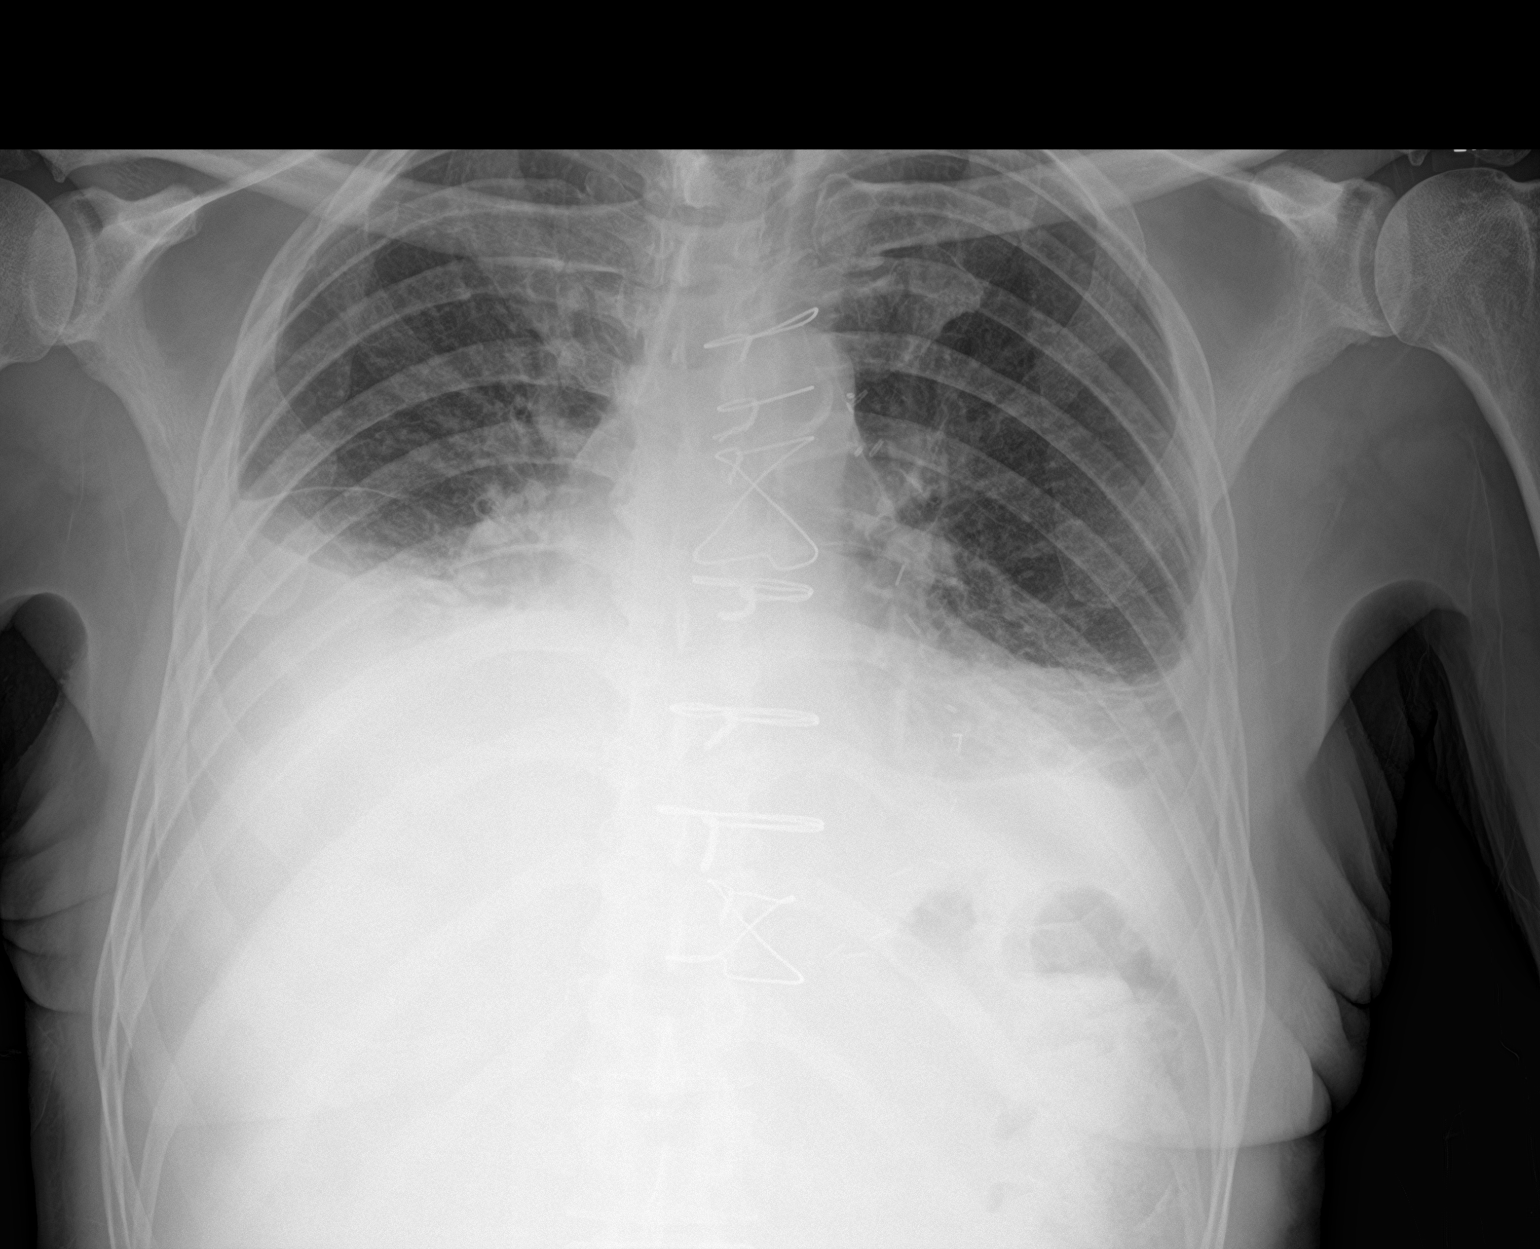

[1 of 1 positions shown; findings below may reference images not displayed]

FINDINGS: No pneumothorax. There is residual pleural fluid on the left with
left base atelectasis. There is a somewhat larger pleural effusion
on the right with right base atelectasis. The lungs elsewhere are
clear. Heart is upper normal in size with pulmonary vascularity
normal. No adenopathy. Patient is status post coronary artery bypass
grafting. There is aortic atherosclerosis. No adenopathy. No bone
lesions
IMPRESSION: No pneumothorax. There are pleural effusions present bilaterally,
currently larger on the right than on the left, with bibasilar
atelectasis. Lungs elsewhere clear. Stable cardiac silhouette.
Aortic Atherosclerosis ([id number]).

## 2022-08-31 ENCOUNTER — Other Ambulatory Visit (HOSPITAL_COMMUNITY): Payer: Self-pay

## 2022-09-02 ENCOUNTER — Other Ambulatory Visit (HOSPITAL_COMMUNITY): Payer: Self-pay

## 2022-09-03 ENCOUNTER — Other Ambulatory Visit: Payer: Self-pay

## 2022-09-03 ENCOUNTER — Other Ambulatory Visit (HOSPITAL_COMMUNITY): Payer: Self-pay

## 2022-09-03 ENCOUNTER — Encounter (HOSPITAL_COMMUNITY): Payer: Self-pay | Admitting: Orthopedic Surgery

## 2022-09-03 NOTE — Progress Notes (Signed)
SDW CALL  Patient was given pre-op instructions over the phone. The opportunity was given for the patient to ask questions. No further questions asked. Patient verbalized understanding of instructions given.   PCP - Ofilia Neas Cardiologist - Gust Rung  PPM/ICD - denies Device Orders -  Rep Notified -   Chest x-ray - 03/05/22 EKG - 03/05/22 Stress Test - none ECHO - 03/06/22 Cardiac Cath - 03/29/20  Sleep Study - none CPAP -   Fasting Blood Sugar - 120's Checks Blood Sugar 2 times a day  Blood Thinner Instructions:na Aspirin Instructions: hold 7/11  ERAS Protcol -clears until 0630 PRE-SURGERY Ensure or G2-   COVID TEST- na   Anesthesia review: yes- history of CAD,Heart failure  Patient denies shortness of breath, fever, cough and chest pain over the phone call    Surgical Instructions    Your procedure is scheduled on July 12  Report to [facility] Main Entrance "A" at 7 A.M., then check in with the Admitting office.  Call this number if you have problems the morning of surgery:  [phone]    Remember:  Do not eat after midnight the night before your surgery  You may drink clear liquids until 0630 the morning of your surgery.   Clear liquids allowed are: Water, Non-Citrus Juices (without pulp), Carbonated Beverages, Clear Tea, Black Coffee ONLY (NO MILK, CREAM OR POWDERED CREAMER of any kind), and Gatorade   Take these medicines the morning of surgery with A SIP OF WATER: Lipitor,Alphagan,Cosopt,Toprol   As of today, STOP taking any Aspirin (unless otherwise instructed by your surgeon) Aleve, Naproxen, Ibuprofen, Motrin, Advil, Goody's, BC's, all herbal medications, fish oil, and all vitamins.  WHAT DO I DO ABOUT MY DIABETES MEDICATION?   Do not take oral diabetes medicines (pills) the morning of surgery. Do not take glimepiride (Amaryl)  the morning of surgery.  If you have not taken Jardiance today, do not take it. Do not take Jardiance the  morning of surgery.   Check your blood sugar the morning of your surgery when you wake up and every 2 hours until you get to the Short Stay unit.  If your blood sugar is less than 70 mg/dL, you will need to treat for low blood sugar: Do not take insulin. Treat a low blood sugar (less than 70 mg/dL) with  cup of clear juice (cranberry or apple), 4 glucose tablets, OR glucose gel. Recheck blood sugar in 15 minutes after treatment (to make sure it is greater than 70 mg/dL). If your blood sugar is not greater than 70 mg/dL on recheck, call [phone] for further instructions. Report your blood sugar to the short stay nurse when you get to Short Stay.  [facility] is not responsible for any belongings or valuables. .   Do NOT Smoke (Tobacco/Vaping)  24 hours prior to your procedure  If you use a CPAP at night, you may bring your mask for your overnight stay.   Contacts, glasses, hearing aids, dentures or partials may not be worn into surgery, please bring cases for these belongings   Patients discharged the day of surgery will not be allowed to drive home, and someone needs to stay with them for 24 hours    Special instructions:    Oral Hygiene is also important to reduce your risk of infection.  Remember - BRUSH YOUR TEETH THE MORNING OF SURGERY WITH YOUR REGULAR TOOTHPASTE   Day of Surgery:  Take a shower the day of or night  before with antibacterial soap. Wear Clean/Comfortable clothing the morning of surgery Do not apply any deodorants/lotions.   Do not wear jewelry or makeup Do not wear lotions, powders, perfumes/colognes, or deodorant. Do not shave 48 hours prior to surgery.  Men may shave face and neck. Do not bring valuables to the hospital. Do not wear nail polish, gel polish, artificial nails, or any other type of covering on natural nails (fingers and toes) If you have artificial nails or gel coating that need to be removed by a nail salon, please have this removed  prior to surgery. Artificial nails or gel coating may interfere with anesthesia's ability to adequately monitor your vital signs. Remember to brush your teeth WITH YOUR REGULAR TOOTHPASTE.

## 2022-09-03 NOTE — Anesthesia Preprocedure Evaluation (Addendum)
Anesthesia Evaluation  Patient identified by MRN, date of birth, ID band Patient awake    Reviewed: Allergy & Precautions, NPO status , Patient's Chart, lab work & pertinent test results, reviewed documented beta blocker date and time   History of Anesthesia Complications Negative for: history of anesthetic complications  Airway Mallampati: II  TM Distance: >3 FB Neck ROM: Full    Dental  (+) Dental Advisory Given, Partial Upper   Pulmonary Current SmokerPatient did not abstain from smoking.   Pulmonary exam normal        Cardiovascular hypertension, Pt. on medications and Pt. on home beta blockers + CAD, + Past MI, + CABG, + Peripheral Vascular Disease and +CHF  Normal cardiovascular exam   '24 Carotid US - 60-79% right ICAS, 1-39% left ICAS  '24 TTE - EF 45 to 50%. Global hypokinesis. Mild MR.     Neuro/Psych negative neurological ROS  negative psych ROS   GI/Hepatic negative GI ROS, Neg liver ROS,,,  Endo/Other  diabetes, Type 2, Oral Hypoglycemic Agents    Renal/GU CRFRenal disease     Musculoskeletal negative musculoskeletal ROS (+)    Abdominal   Peds  Hematology negative hematology ROS (+)   Anesthesia Other Findings R eye blindness   Reproductive/Obstetrics                              Anesthesia Physical Anesthesia Plan  ASA: 3  Anesthesia Plan: Regional   Post-op Pain Management: Tylenol PO (pre-op)* and Regional block*   Induction: Intravenous  PONV Risk Score and Plan: 2 and Treatment may vary due to age or medical condition, Ondansetron and Propofol infusion  Airway Management Planned: Natural Airway and Simple Face Mask  Additional Equipment: None  Intra-op Plan:   Post-operative Plan:   Informed Consent: I have reviewed the patients History and Physical, chart, labs and discussed the procedure including the risks, benefits and alternatives for the  proposed anesthesia with the patient or authorized representative who has indicated his/her understanding and acceptance.       Plan Discussed with: CRNA and Anesthesiologist  Anesthesia Plan Comments: (See PAT note )         Anesthesia Quick Evaluation

## 2022-09-03 NOTE — Progress Notes (Signed)
Anesthesia Chart Review: Same day workup  Follows cardiology for history of CAD s/p CABG x 29 March 2020, systolic heart failure/ICM with improved EF, HTN, carotid disease (RICA 60-79%/LICA 1-39% by doppler 07/2022) , PAD.  He was last seen by Dr. Shari Prows on 07/14/22. Stable at that time with no anginal symptoms. Metoprolol was decreased due to soft BP. Continued on Lasix, losartan, aldactone, Jardiance, ASA.    History of CKD 3.   Non-insulin-dependent DM2, last A1c 7.6 on 07/28/22.    Patient will need day of surgery labs and evaluation.   EKG 03/06/22: NSR. Rate 78. Minimal voltage criteria for LVH.    TTE 03/06/22:  1. Left ventricular ejection fraction, by estimation, is 45 to 50%. The  left ventricle has mildly decreased function. The left ventricle  demonstrates global hypokinesis. Left ventricular diastolic parameters  were normal. The average left ventricular  global longitudinal strain is -17.8 %. The global longitudinal strain is  abnormal.   2. Right ventricular systolic function is normal. The right ventricular  size is normal. There is normal pulmonary artery systolic pressure. The  estimated right ventricular systolic pressure is 6.0 mmHg.   3. The mitral valve is grossly normal. Mild mitral valve regurgitation.  No evidence of mitral stenosis.   4. The aortic valve is tricuspid. Aortic valve regurgitation is not  visualized. No aortic stenosis is present.   5. The inferior vena cava is normal in size with greater than 50%  respiratory variability, suggesting right atrial pressure of 3 mmHg.   Comparison(s): No significant change from prior study.     [facility] Short Stay Center/Anesthesiology Phone [phone] 09/03/2022 1:33 PM

## 2022-09-04 ENCOUNTER — Other Ambulatory Visit (HOSPITAL_COMMUNITY): Payer: Self-pay

## 2022-09-04 ENCOUNTER — Encounter (HOSPITAL_COMMUNITY): Payer: Self-pay | Admitting: Orthopedic Surgery

## 2022-09-04 ENCOUNTER — Encounter (HOSPITAL_COMMUNITY)
Admission: RE | Disposition: A | Discharge status: Home / Self Care | Payer: Self-pay | Source: Home / Self Care | Attending: Orthopedic Surgery

## 2022-09-04 ENCOUNTER — Ambulatory Visit (HOSPITAL_COMMUNITY)
Admission: RE | Admit: 2022-09-04 | Discharge: 2022-09-04 | Disposition: A | Discharge status: Home / Self Care | Payer: 59 | Attending: Orthopedic Surgery | Admitting: Orthopedic Surgery

## 2022-09-04 ENCOUNTER — Other Ambulatory Visit: Payer: Self-pay

## 2022-09-04 ENCOUNTER — Ambulatory Visit (HOSPITAL_COMMUNITY): Payer: 59 | Admitting: Physician Assistant

## 2022-09-04 ENCOUNTER — Ambulatory Visit (HOSPITAL_BASED_OUTPATIENT_CLINIC_OR_DEPARTMENT_OTHER): Payer: 59 | Admitting: Physician Assistant

## 2022-09-04 DIAGNOSIS — N189 Chronic kidney disease, unspecified: Secondary | ICD-10-CM

## 2022-09-04 DIAGNOSIS — M86672 Other chronic osteomyelitis, left ankle and foot: Secondary | ICD-10-CM | POA: Diagnosis not present

## 2022-09-04 DIAGNOSIS — E11621 Type 2 diabetes mellitus with foot ulcer: Secondary | ICD-10-CM | POA: Insufficient documentation

## 2022-09-04 DIAGNOSIS — I96 Gangrene, not elsewhere classified: Secondary | ICD-10-CM | POA: Diagnosis not present

## 2022-09-04 DIAGNOSIS — I130 Hypertensive heart and chronic kidney disease with heart failure and stage 1 through stage 4 chronic kidney disease, or unspecified chronic kidney disease: Secondary | ICD-10-CM

## 2022-09-04 DIAGNOSIS — E1151 Type 2 diabetes mellitus with diabetic peripheral angiopathy without gangrene: Secondary | ICD-10-CM | POA: Insufficient documentation

## 2022-09-04 DIAGNOSIS — I2510 Atherosclerotic heart disease of native coronary artery without angina pectoris: Secondary | ICD-10-CM | POA: Diagnosis not present

## 2022-09-04 DIAGNOSIS — I252 Old myocardial infarction: Secondary | ICD-10-CM | POA: Diagnosis not present

## 2022-09-04 DIAGNOSIS — F17210 Nicotine dependence, cigarettes, uncomplicated: Secondary | ICD-10-CM | POA: Insufficient documentation

## 2022-09-04 DIAGNOSIS — M869 Osteomyelitis, unspecified: Secondary | ICD-10-CM | POA: Diagnosis not present

## 2022-09-04 DIAGNOSIS — I5022 Chronic systolic (congestive) heart failure: Secondary | ICD-10-CM | POA: Diagnosis not present

## 2022-09-04 DIAGNOSIS — M86172 Other acute osteomyelitis, left ankle and foot: Secondary | ICD-10-CM | POA: Insufficient documentation

## 2022-09-04 DIAGNOSIS — I5043 Acute on chronic combined systolic (congestive) and diastolic (congestive) heart failure: Secondary | ICD-10-CM

## 2022-09-04 DIAGNOSIS — E1122 Type 2 diabetes mellitus with diabetic chronic kidney disease: Secondary | ICD-10-CM | POA: Insufficient documentation

## 2022-09-04 DIAGNOSIS — E1169 Type 2 diabetes mellitus with other specified complication: Secondary | ICD-10-CM | POA: Diagnosis not present

## 2022-09-04 DIAGNOSIS — Z7984 Long term (current) use of oral hypoglycemic drugs: Secondary | ICD-10-CM | POA: Diagnosis not present

## 2022-09-04 DIAGNOSIS — G8918 Other acute postprocedural pain: Secondary | ICD-10-CM | POA: Diagnosis not present

## 2022-09-04 DIAGNOSIS — M868X7 Other osteomyelitis, ankle and foot: Secondary | ICD-10-CM | POA: Diagnosis not present

## 2022-09-04 DIAGNOSIS — H5461 Unqualified visual loss, right eye, normal vision left eye: Secondary | ICD-10-CM | POA: Diagnosis not present

## 2022-09-04 DIAGNOSIS — Z951 Presence of aortocoronary bypass graft: Secondary | ICD-10-CM | POA: Insufficient documentation

## 2022-09-04 DIAGNOSIS — L02612 Cutaneous abscess of left foot: Secondary | ICD-10-CM

## 2022-09-04 DIAGNOSIS — L03032 Cellulitis of left toe: Secondary | ICD-10-CM | POA: Insufficient documentation

## 2022-09-04 DIAGNOSIS — I5023 Acute on chronic systolic (congestive) heart failure: Secondary | ICD-10-CM | POA: Diagnosis not present

## 2022-09-04 DIAGNOSIS — E1152 Type 2 diabetes mellitus with diabetic peripheral angiopathy with gangrene: Secondary | ICD-10-CM | POA: Diagnosis not present

## 2022-09-04 DIAGNOSIS — N1831 Chronic kidney disease, stage 3a: Secondary | ICD-10-CM | POA: Diagnosis not present

## 2022-09-04 DIAGNOSIS — L97526 Non-pressure chronic ulcer of other part of left foot with bone involvement without evidence of necrosis: Secondary | ICD-10-CM | POA: Diagnosis not present

## 2022-09-04 DIAGNOSIS — Z79899 Other long term (current) drug therapy: Secondary | ICD-10-CM | POA: Insufficient documentation

## 2022-09-04 DIAGNOSIS — I110 Hypertensive heart disease with heart failure: Secondary | ICD-10-CM | POA: Diagnosis not present

## 2022-09-04 DIAGNOSIS — L97514 Non-pressure chronic ulcer of other part of right foot with necrosis of bone: Secondary | ICD-10-CM | POA: Diagnosis not present

## 2022-09-04 HISTORY — PX: AMPUTATION: SHX166

## 2022-09-04 LAB — CBC
HCT: 32.7 % — ABNORMAL LOW (ref 39.0–52.0)
Hemoglobin: 10.7 g/dL — ABNORMAL LOW (ref 13.0–17.0)
MCH: 30.7 pg (ref 26.0–34.0)
MCHC: 32.7 g/dL (ref 30.0–36.0)
MCV: 93.7 fL (ref 80.0–100.0)
Platelets: 271 10*3/uL (ref 150–400)
RBC: 3.49 MIL/uL — ABNORMAL LOW (ref 4.22–5.81)
RDW: 13.4 % (ref 11.5–15.5)
WBC: 8.3 10*3/uL (ref 4.0–10.5)
nRBC: 0 % (ref 0.0–0.2)

## 2022-09-04 LAB — BASIC METABOLIC PANEL
Anion gap: 9 (ref 5–15)
BUN: 26 mg/dL — ABNORMAL HIGH (ref 8–23)
CO2: 20 mmol/L — ABNORMAL LOW (ref 22–32)
Calcium: 8.4 mg/dL — ABNORMAL LOW (ref 8.9–10.3)
Chloride: 105 mmol/L (ref 98–111)
Creatinine, Ser: 1.53 mg/dL — ABNORMAL HIGH (ref 0.61–1.24)
GFR, Estimated: 50 mL/min — ABNORMAL LOW (ref >60)
Glucose, Bld: 166 mg/dL — ABNORMAL HIGH (ref 70–99)
Potassium: 4.4 mmol/L (ref 3.5–5.1)
Sodium: 134 mmol/L — ABNORMAL LOW (ref 135–145)

## 2022-09-04 LAB — GLUCOSE, CAPILLARY
Glucose-Capillary: 153 mg/dL — ABNORMAL HIGH (ref 70–99)
Glucose-Capillary: 135 mg/dL — ABNORMAL HIGH (ref 70–99)

## 2022-09-04 SURGERY — AMPUTATION, FOOT, RAY
Anesthesia: General | Site: Foot | Laterality: Left

## 2022-09-04 MED ORDER — ACETAMINOPHEN 500 MG PO TABS
1000.0000 mg | ORAL_TABLET | Freq: Once | ORAL | Status: DC
  Filled 2022-09-04: qty 2

## 2022-09-04 MED ORDER — PHENYLEPHRINE 80 MCG/ML (10ML) SYRINGE FOR IV PUSH (FOR BLOOD PRESSURE SUPPORT)
PREFILLED_SYRINGE | INTRAVENOUS | Status: AC
  Filled 2022-09-04: qty 10

## 2022-09-04 MED ORDER — OXYCODONE-ACETAMINOPHEN 5-325 MG PO TABS
1.0000 | ORAL_TABLET | ORAL | 0 refills | Status: DC | PRN
Start: 2022-09-04 — End: 2022-09-12
  Filled 2022-09-04: qty 30, 5d supply, fill #0

## 2022-09-04 MED ORDER — OXYCODONE HCL 5 MG/5ML PO SOLN: 5.0000 mg | Freq: Once | ORAL | Status: DC | PRN

## 2022-09-04 MED ORDER — MIDAZOLAM HCL 2 MG/2ML IJ SOLN
INTRAMUSCULAR | Status: AC
  Filled 2022-09-04: qty 2

## 2022-09-04 MED ORDER — PROPOFOL 10 MG/ML IV BOLUS
INTRAVENOUS | Status: AC
  Filled 2022-09-04: qty 20

## 2022-09-04 MED ORDER — INSULIN ASPART 100 UNIT/ML IJ SOLN
0.0000 [IU] | INTRAMUSCULAR | Status: DC | PRN
  Administered 2022-09-04: 2 [IU] via SUBCUTANEOUS
  Filled 2022-09-04: qty 1

## 2022-09-04 MED ORDER — FENTANYL CITRATE (PF) 100 MCG/2ML IJ SOLN: 25.0000 ug | INTRAMUSCULAR | Status: DC | PRN

## 2022-09-04 MED ORDER — ORAL CARE MOUTH RINSE: 15.0000 mL | Freq: Once | OROMUCOSAL | Status: AC

## 2022-09-04 MED ORDER — MIDAZOLAM HCL 2 MG/2ML IJ SOLN
INTRAMUSCULAR | Status: AC
  Administered 2022-09-04: 1 mg via INTRAVENOUS
  Filled 2022-09-04: qty 2

## 2022-09-04 MED ORDER — BUPIVACAINE-EPINEPHRINE (PF) 0.5% -1:200000 IJ SOLN
INTRAMUSCULAR | Status: DC | PRN
  Administered 2022-09-04: 25 mL via PERINEURAL
  Administered 2022-09-04: 15 mL via PERINEURAL

## 2022-09-04 MED ORDER — MIDAZOLAM HCL 2 MG/2ML IJ SOLN
INTRAMUSCULAR | Status: DC | PRN
  Administered 2022-09-04: .5 mg via INTRAVENOUS

## 2022-09-04 MED ORDER — FENTANYL CITRATE (PF) 250 MCG/5ML IJ SOLN
INTRAMUSCULAR | Status: AC
  Filled 2022-09-04: qty 5

## 2022-09-04 MED ORDER — 0.9 % SODIUM CHLORIDE (POUR BTL) OPTIME
TOPICAL | Status: DC | PRN
  Administered 2022-09-04: 1000 mL

## 2022-09-04 MED ORDER — CEFAZOLIN SODIUM-DEXTROSE 2-4 GM/100ML-% IV SOLN
2.0000 g | INTRAVENOUS | Status: AC
  Administered 2022-09-04: 2 g via INTRAVENOUS
  Filled 2022-09-04: qty 100

## 2022-09-04 MED ORDER — PROPOFOL 10 MG/ML IV BOLUS
INTRAVENOUS | Status: DC | PRN
  Administered 2022-09-04: 30 mg via INTRAVENOUS
  Administered 2022-09-04: 40 mg via INTRAVENOUS
  Administered 2022-09-04 (×2): 20 mg via INTRAVENOUS

## 2022-09-04 MED ORDER — PHENYLEPHRINE 80 MCG/ML (10ML) SYRINGE FOR IV PUSH (FOR BLOOD PRESSURE SUPPORT)
PREFILLED_SYRINGE | INTRAVENOUS | Status: DC | PRN
  Administered 2022-09-04: 80 ug via INTRAVENOUS

## 2022-09-04 MED ORDER — FENTANYL CITRATE (PF) 100 MCG/2ML IJ SOLN: 50.0000 ug | Freq: Once | INTRAMUSCULAR | Status: AC

## 2022-09-04 MED ORDER — LACTATED RINGERS IV SOLN: INTRAVENOUS | Status: DC

## 2022-09-04 MED ORDER — LIDOCAINE 2% (20 MG/ML) 5 ML SYRINGE
INTRAMUSCULAR | Status: AC
  Filled 2022-09-04: qty 5

## 2022-09-04 MED ORDER — ONDANSETRON HCL 4 MG/2ML IJ SOLN
INTRAMUSCULAR | Status: DC | PRN
  Administered 2022-09-04: 4 mg via INTRAVENOUS

## 2022-09-04 MED ORDER — CHLORHEXIDINE GLUCONATE 0.12 % MT SOLN
15.0000 mL | Freq: Once | OROMUCOSAL | Status: AC
  Administered 2022-09-04: 15 mL via OROMUCOSAL
  Filled 2022-09-04: qty 15

## 2022-09-04 MED ORDER — ONDANSETRON HCL 4 MG/2ML IJ SOLN: 4.0000 mg | Freq: Once | INTRAMUSCULAR | Status: DC | PRN

## 2022-09-04 MED ORDER — FENTANYL CITRATE (PF) 100 MCG/2ML IJ SOLN
INTRAMUSCULAR | Status: AC
  Administered 2022-09-04: 50 ug via INTRAVENOUS
  Filled 2022-09-04: qty 2

## 2022-09-04 MED ORDER — OXYCODONE HCL 5 MG PO TABS: 5.0000 mg | ORAL_TABLET | Freq: Once | ORAL | Status: DC | PRN

## 2022-09-04 MED ORDER — MIDAZOLAM HCL 2 MG/2ML IJ SOLN: 1.0000 mg | Freq: Once | INTRAMUSCULAR | Status: AC

## 2022-09-04 SURGICAL SUPPLY — 35 items
BAG COUNTER SPONGE SURGICOUNT (BAG) ×1 IMPLANT
BAG SPNG CNTER NS LX DISP (BAG) ×1
BLADE SAW SGTL MED 73X18.5 STR (BLADE) IMPLANT
BLADE SURG 21 STRL SS (BLADE) ×1 IMPLANT
BNDG COHESIVE 4X5 TAN STRL (GAUZE/BANDAGES/DRESSINGS) ×1 IMPLANT
BNDG GAUZE DERMACEA FLUFF 4 (GAUZE/BANDAGES/DRESSINGS) ×1 IMPLANT
BNDG GZE DERMACEA 4 6PLY (GAUZE/BANDAGES/DRESSINGS)
COVER SURGICAL LIGHT HANDLE (MISCELLANEOUS) ×2 IMPLANT
DRAPE DERMATAC (DRAPES) IMPLANT
DRAPE U-SHAPE 47X51 STRL (DRAPES) ×2 IMPLANT
DRESSING PEEL AND PLC PRVNA 13 (GAUZE/BANDAGES/DRESSINGS) IMPLANT
DRSG ADAPTIC 3X8 NADH LF (GAUZE/BANDAGES/DRESSINGS) ×1 IMPLANT
DRSG PEEL AND PLACE PREVENA 13 (GAUZE/BANDAGES/DRESSINGS) ×1
DURAPREP 26ML APPLICATOR (WOUND CARE) ×1 IMPLANT
ELECT REM PT RETURN 9FT ADLT (ELECTROSURGICAL) ×1
ELECTRODE REM PT RTRN 9FT ADLT (ELECTROSURGICAL) ×1 IMPLANT
GAUZE PAD ABD 8X10 STRL (GAUZE/BANDAGES/DRESSINGS) ×2 IMPLANT
GAUZE SPONGE 4X4 12PLY STRL (GAUZE/BANDAGES/DRESSINGS) ×1 IMPLANT
GLOVE BIOGEL PI IND STRL 9 (GLOVE) ×1 IMPLANT
GLOVE SURG ORTHO 9.0 STRL STRW (GLOVE) ×1 IMPLANT
GOWN STRL REUS W/ TWL XL LVL3 (GOWN DISPOSABLE) ×2 IMPLANT
GOWN STRL REUS W/TWL XL LVL3 (GOWN DISPOSABLE) ×2
GRAFT SKIN WND MICRO 38 (Tissue) IMPLANT
KIT BASIN OR (CUSTOM PROCEDURE TRAY) ×1 IMPLANT
KIT DRSG PREVENA PLUS 7DAY 125 (MISCELLANEOUS) IMPLANT
KIT TURNOVER KIT B (KITS) ×1 IMPLANT
NS IRRIG 1000ML POUR BTL (IV SOLUTION) ×1 IMPLANT
PACK ORTHO EXTREMITY (CUSTOM PROCEDURE TRAY) ×1 IMPLANT
PAD ARMBOARD 7.5X6 YLW CONV (MISCELLANEOUS) ×2 IMPLANT
STOCKINETTE IMPERVIOUS LG (DRAPES) IMPLANT
SUT ETHILON 2 0 PSLX (SUTURE) ×1 IMPLANT
TOWEL GREEN STERILE (TOWEL DISPOSABLE) ×1 IMPLANT
TUBE CONNECTING 12X1/4 (SUCTIONS) ×1 IMPLANT
WATER STERILE IRR 1000ML POUR (IV SOLUTION) IMPLANT
YANKAUER SUCT BULB TIP NO VENT (SUCTIONS) ×1 IMPLANT

## 2022-09-04 NOTE — Transfer of Care (Signed)
Immediate Anesthesia Transfer of Care Note  Patient: Curtis Watkins  Procedure(s) Performed: LEFT 1ST RAY AMPUTATION (Left: Foot)  Patient Location: PACU  Anesthesia Type:MAC combined with regional for post-op pain  Level of Consciousness: awake, drowsy, and patient cooperative  Airway & Oxygen Therapy: Patient Spontanous Breathing  Post-op Assessment: Report given to RN and Post -op Vital signs reviewed and stable  Post vital signs: Reviewed and stable  Last Vitals:  Vitals Value Taken Time  BP 161/67 09/04/22 1012  Temp    Pulse 66 09/04/22 1012  Resp 18 09/04/22 1012  SpO2 100 % 09/04/22 1012  Vitals shown include unfiled device data.  Last Pain:  Vitals:   09/04/22 0825  TempSrc:   PainSc: 0-No pain         Complications: No notable events documented.

## 2022-09-04 NOTE — Anesthesia Procedure Notes (Signed)
Anesthesia Regional Block: Popliteal block   Pre-Anesthetic Checklist: , timeout performed,  Correct Patient, Correct Site, Correct Laterality,  Correct Procedure, Correct Position, site marked,  Risks and benefits discussed,  Surgical consent,  Pre-op evaluation,  At surgeon's request and post-op pain management  Laterality: Left  Prep: chloraprep       Needles:  Injection technique: Single-shot  Needle Type: Echogenic Needle     Needle Length: 10cm  Needle Gauge: 21     Additional Needles:   Narrative:  Start time: 09/04/2022 8:16 AM End time: 09/04/2022 8:19 AM Injection made incrementally with aspirations every 5 mL.  Performed by: Personally  Anesthesiologist: Beryle Lathe, MD  Additional Notes: No pain on injection. No increased resistance to injection. Injection made in 5cc increments. Good needle visualization. Patient tolerated the procedure well.

## 2022-09-04 NOTE — Anesthesia Procedure Notes (Signed)
Anesthesia Regional Block: Adductor canal block   Pre-Anesthetic Checklist: , timeout performed,  Correct Patient, Correct Site, Correct Laterality,  Correct Procedure, Correct Position, site marked,  Risks and benefits discussed,  Surgical consent,  Pre-op evaluation,  At surgeon's request and post-op pain management  Laterality: Left  Prep: chloraprep       Needles:  Injection technique: Single-shot  Needle Type: Echogenic Needle     Needle Length: 10cm  Needle Gauge: 21     Additional Needles:   Narrative:  Start time: 09/04/2022 8:11 AM End time: 09/04/2022 8:14 AM Injection made incrementally with aspirations every 5 mL.  Performed by: Personally  Anesthesiologist: Beryle Lathe, MD  Additional Notes: No pain on injection. No increased resistance to injection. Injection made in 5cc increments. Good needle visualization. Patient tolerated the procedure well.

## 2022-09-04 NOTE — Op Note (Signed)
09/04/2022  10:01 AM  PATIENT:  Curtis Watkins    PRE-OPERATIVE DIAGNOSIS:  Osteomyelitis Left Foot and abscess great toe  POST-OPERATIVE DIAGNOSIS:  Same  PROCEDURE:  LEFT 1ST RAY AMPUTATION Local tissue rearrangement for wound closure 11 x 4 cm. Application Kerecis micro graft 38 cm. Application of Prevena 13 cm wound VAC.  SURGEON:  Nadara Mustard, MD  PHYSICIAN ASSISTANT:None ANESTHESIA:   General  PREOPERATIVE INDICATIONS:  Curtis Watkins is a  67 y.o. male with a diagnosis of Osteomyelitis Left Foot who failed conservative measures and elected for surgical management.    The risks benefits and alternatives were discussed with the patient preoperatively including but not limited to the risks of infection, bleeding, nerve injury, cardiopulmonary complications, the need for revision surgery, among others, and the patient was willing to proceed.  OPERATIVE IMPLANTS:   Implant Name Type Inv. Item Serial No. Manufacturer Lot No. LRB No. Used Action  GRAFT SKIN WND MICRO 38 - [id number] Tissue GRAFT SKIN WND MICRO 38  [employer] [id number] Left 1 Implanted    @ENCIMAGES @  OPERATIVE FINDINGS: Tissue margins were clear after debridement.  OPERATIVE PROCEDURE: Patient was brought the operating room and underwent general anesthetic.  After adequate levels anesthesia obtained patient's left lower extremity was prepped using DuraPrep draped into a sterile field a timeout was called.  Elliptical incision was made around the ulcerative tissue.  The first ray was resected in 1 block of tissue through the base of the first metatarsal.  The wound edges had necrotic tissue and this was debrided back to healthy viable tissue.  Electrocautery was used hemostasis wound was irrigated with normal saline.  After excision of the necrotic tissue this left a wound that was 11 x 4 cm.  The wound bed was filled with 38 cm of Kerecis micro graft.  Local tissue rearrangement was used to close the wound 11 x  4 cm.  13 cm wound VAC was applied this had a good suction fit patient was taken the PACU in stable condition.   DISCHARGE PLANNING:  Antibiotic duration: Preoperative antibiotics  Weightbearing: Minimize weightbearing left lower extremity  Pain medication: Prescription for Percocet  Dressing care/ Wound VAC: Continue wound VAC for 1 week  Ambulatory devices: Crutches  Discharge to: Home.  Follow-up: In the office 1 week post operative.

## 2022-09-04 NOTE — H&P (Signed)
Curtis Watkins is an 67 y.o. male.   Chief Complaint: Pain ulceration left great toe. HPI: The patient is a 67 year old gentleman who is seen in follow-up status post removal of foreign body of the left foot on May 8. we have also been following Wagner grade 1 ulcer beneath the first metatarsal head   Past Medical History:  Diagnosis Date   Blind right eye    Carotid artery disease (HCC)    04/01/20: 60-79% RICA stenosis, 1-39% LICA stenosis   Chronic kidney disease    ckd stage 3 a per dr Ronald Lobo 02-05-2021 epic   Coronary artery disease    NSTEMI s/p CABG in 03/2020 (LIMA-->LAD/diag, L radial-->OM1/OM3); b.   DM type 2    Glaucoma of right eye, unspecified glaucoma type    HFrEF (heart failure with reduced ejection fraction) (HCC)    Echo 6/22: EF 45, global HK, mild MR, RVSP 31.9   Hypertension    Ischemic cardiomyopathy    PAD (peripheral artery disease) (HCC)    occluded right axillary artery 03/29/20     Past Surgical History:  Procedure Laterality Date   AMPUTATION Right 04/24/2020   Procedure: RIGHT 2ND AND 3RD TOE AMPUTATION;  Surgeon: Nadara Mustard, MD;  [facility];  Service: Orthopedics;  Laterality: Right;   AMPUTATION Right 10/22/2021   Procedure: RIGHT TRANSMETATARSAL AMPUTATION;  Surgeon: Nadara Mustard, MD;  [facility];  Service: Orthopedics;  Laterality: Right;   APPLICATION OF WOUND VAC Right 10/22/2021   Procedure: APPLICATION OF WOUND VAC;  Surgeon: Nadara Mustard, MD;  [facility];  Service: Orthopedics;  Laterality: Right;   CENTRAL VENOUS CATHETER INSERTION Left 04/03/2020   Procedure: INSERTION OF FEMORAL ARTERIAL LINE;  Surgeon: Linden Dolin, MD;  Location: [facility];  Service: Open Heart Surgery;  Laterality: Left;   CORONARY ARTERY BYPASS GRAFT N/A 04/03/2020   Procedure: CORONARY ARTERY BYPASS GRAFTING (CABG) TIMES FOUR USING LEFT INTERNAL MAMMARY ARTERY AND LEFT RADIAL ARTERY.;  Surgeon: Linden Dolin, MD;  [facility];  Service: Open  Heart Surgery;  Laterality: N/A;   CORONARY PRESSURE/FFR WITH 3D MAPPING N/A 03/29/2020   Procedure: Coronary Pressure Wire/FFR w/3D Mapping;  Surgeon: Lyn Records, MD;  [facility];  Service: Cardiovascular;  Laterality: N/A;   EYE SURGERY Right 01/21/2021   right eye cataract lens replacement surgery   I & D EXTREMITY Left 07/01/2022   Procedure: LEFT FOOT DEBRIDEMENT AND FOREIGN BODY REMOVAL;  Surgeon: Nadara Mustard, MD;  Location: [facility] OR;  Service: Orthopedics;  Laterality: Left;   IR THORACENTESIS ASP PLEURAL SPACE W/IMG GUIDE  05/03/2020   IR THORACENTESIS ASP PLEURAL SPACE W/IMG GUIDE  05/16/2020   LEFT HEART CATH AND CORONARY ANGIOGRAPHY N/A 03/29/2020   Procedure: LEFT HEART CATH AND CORONARY ANGIOGRAPHY;  Surgeon: Lyn Records, MD;  [facility];  Service: Cardiovascular;  Laterality: N/A;   PENILE PROSTHESIS IMPLANT N/A 03/03/2021   Procedure: PENILE PROTHESIS INFLATABLE COLOPLAST;  Surgeon: Crista Elliot, MD;  Location: [facility];  Service: Urology;  Laterality: N/A;  REQUESTING 2 HRS   RADIAL ARTERY HARVEST Left 04/03/2020   Procedure: LEFT RADIAL ARTERY HARVEST;  Surgeon: Linden Dolin, MD;  [facility];  Service: Open Heart Surgery;  Laterality: Left;   TEE WITHOUT CARDIOVERSION N/A 04/03/2020   Procedure: TRANSESOPHAGEAL ECHOCARDIOGRAM (TEE);  Surgeon: Linden Dolin, MD;  Location: [facility] OR;  Service: Open  Heart Surgery;  Laterality: N/A;   UPPER EXTREMITY ANGIOGRAPHY  03/29/2020   Procedure: Upper Extremity Angiography;  Surgeon: Lyn Records, MD;  Location: [facility] INVASIVE CV LAB;  Service: Cardiovascular;;  rt.  arm    Family History  Problem Relation Age of Onset   Diabetes Mother    Social History:  reports that he has been smoking cigarettes. He has a 0.5 pack-year smoking history. He has been exposed to tobacco smoke. He has never used smokeless tobacco. He reports that he does not currently use alcohol  after a past usage of about 1.0 standard drink of alcohol per week. He reports that he does not currently use drugs after having used the following drugs: Marijuana.  Allergies: No Known Allergies  Medications Prior to Admission  Medication Sig Dispense Refill   amoxicillin-clavulanate (AUGMENTIN) 500-125 MG tablet Take 1 tablet by mouth 3 (three) times daily. 45 tablet 0   aspirin 325 MG tablet Take 162 mg by mouth in the morning.     atorvastatin (LIPITOR) 80 MG tablet Take 1 tablet (80 mg total) by mouth daily. 90 tablet 3   brimonidine (ALPHAGAN) 0.2 % ophthalmic solution Place 1 drop into the left eye 2 (two) times daily. 15 mL 11   dorzolamide-timolol (COSOPT) 2-0.5 % ophthalmic solution Place 1 drop into the left eye 2 (two) times daily. 10 mL 11   empagliflozin (JARDIANCE) 25 MG TABS tablet Take 1 tablet (25 mg total) by mouth daily before breakfast. 90 tablet 3   furosemide (LASIX) 20 MG tablet Take 1 tablet (20 mg total) by mouth 3 (three) times a week. Take on Mondays, Wednesdays, and Fridays. 45 tablet 3   glimepiride (AMARYL) 1 MG tablet Take 1 tablet (1 mg total) by mouth daily before breakfast. 90 tablet 3   losartan (COZAAR) 25 MG tablet Take 1/2 tablet (12.5 mg total) by mouth daily. 45 tablet 3   metoprolol succinate (TOPROL XL) 25 MG 24 hr tablet Take 1/2 tablet (12.5 mg total) by mouth daily. 45 tablet 3   spironolactone (ALDACTONE) 25 MG tablet Take 1/2 tablet (12.5 mg total) by mouth daily. 45 tablet 2   blood glucose meter kit and supplies by Other route as directed. Dispense based on patient and insurance preference. Use up to 2x times daily as directed. (FOR ICD-10 E10.9, E11.9).     ciprofloxacin (CIPRO) 500 MG tablet Take 1 tablet (500 mg total) by mouth 2 (two) times daily. (Patient not taking: Reported on 09/01/2022) 42 tablet 0   dorzolamide-timolol (COSOPT) 22.3-6.8 MG/ML ophthalmic solution Place 1 drop into the left eye 2 (two) times daily. (Patient not taking:  Reported on 09/01/2022) 10 mL 11   oxyCODONE-acetaminophen (PERCOCET/ROXICET) 5-325 MG tablet Take 1 tablet by mouth every 4 (four) hours as needed. (Patient not taking: Reported on 09/01/2022) 30 tablet 0    No results found for this or any previous visit (from the past 48 hour(s)). No results found.  Review of Systems  All other systems reviewed and are negative.   There were no vitals taken for this visit. Physical Exam  Patient is alert, oriented, no adenopathy, well-dressed, normal affect, normal respiratory effort. On examination of the left foot beneath the first metatarsal head there is open ulceration this is 3 cm in diameter unfortunately there is some extension of the ulcer of the medial column of his foot there is exposed bone in the wound bed necrotic tissue and remainder.  There is epiboly.  There is  no purulence no cellulitis surrounding.  To the lateral foot over the area of surgical debridement this has significantly improved remaining open area 5 mm in diameter 3 mm deep there is no tracking today.  No drainage no erythema Assessment/Plan Assessment: Osteomyelitis and ulceration left great toe.  Plan: Will plan for a left foot first ray amputation.  Risk and benefits were discussed including infection neurovascular injury nonhealing wound need for additional surgery.  Patient states he understands wished to proceed at this time.  Nadara Mustard, MD 09/04/2022, 6:33 AM

## 2022-09-04 NOTE — Interval H&P Note (Signed)
History and Physical Interval Note:  09/04/2022 10:04 AM  Curtis Watkins  has presented today for surgery, with the diagnosis of Osteomyelitis Left Foot.  The various methods of treatment have been discussed with the patient and family. After consideration of risks, benefits and other options for treatment, the patient has consented to  Procedure(s): LEFT 1ST RAY AMPUTATION (Left) as a surgical intervention.  The patient's history has been reviewed, patient examined, no change in status, stable for surgery.  I have reviewed the patient's chart and labs.  Questions were answered to the patient's satisfaction.     Nadara Mustard

## 2022-09-04 NOTE — Anesthesia Postprocedure Evaluation (Signed)
Anesthesia Post Note  Patient: Curtis Watkins  Procedure(s) Performed: LEFT 1ST RAY AMPUTATION (Left: Foot)     Patient location during evaluation: PACU Anesthesia Type: Regional Level of consciousness: awake and alert Pain management: pain level controlled Vital Signs Assessment: post-procedure vital signs reviewed and stable Respiratory status: spontaneous breathing, nonlabored ventilation and respiratory function stable Cardiovascular status: stable and blood pressure returned to baseline Anesthetic complications: no   No notable events documented.  Last Vitals:  Vitals:   09/04/22 1030 09/04/22 1045  BP: (!) 158/67 (!) 171/74  Pulse: 64 64  Resp: 16 14  Temp:  36.8 C  SpO2: 100% 100%    Last Pain:  Vitals:   09/04/22 1045  TempSrc:   PainSc: 0-No pain                 Beryle Lathe

## 2022-09-04 NOTE — Anesthesia Procedure Notes (Signed)
Procedure Name: MAC Date/Time: 09/04/2022 9:24 AM  Performed by: Aundria Rud, CRNAPre-anesthesia Checklist: Patient identified, Emergency Drugs available, Suction available and Patient being monitored Patient Re-evaluated:Patient Re-evaluated prior to induction Oxygen Delivery Method: Simple face mask Preoxygenation: Pre-oxygenation with 100% oxygen Induction Type: IV induction Placement Confirmation: positive ETCO2 and CO2 detector Dental Injury: Teeth and Oropharynx as per pre-operative assessment

## 2022-09-04 NOTE — Progress Notes (Signed)
Orthopedic Tech Progress Note Patient Details:  Curtis Watkins 1955/12/20 [id number]  Ortho Devices Type of Ortho Device: Postop shoe/boot Ortho Device/Splint Location: LLE Ortho Device/Splint Interventions: Ordered, Application, Adjustment   Post Interventions Patient Tolerated: Well Instructions Provided: Care of device, Adjustment of device  [name] 09/04/2022, 10:34 AM

## 2022-09-05 ENCOUNTER — Encounter (HOSPITAL_COMMUNITY): Payer: Self-pay | Admitting: Orthopedic Surgery

## 2022-09-07 LAB — SURGICAL PATHOLOGY

## 2022-09-08 IMAGING — DX DG CHEST 1V PORT
1 series · 1 of 1 positions shown · non-contrast
Comparison: 05/25/2020; 05/24/2020; 05/16/2020

CLINICAL DATA: Evaluate for pneumothorax and bilateral pleural
effusions.

EXAM:
PORTABLE CHEST 1 VIEW

[chest]
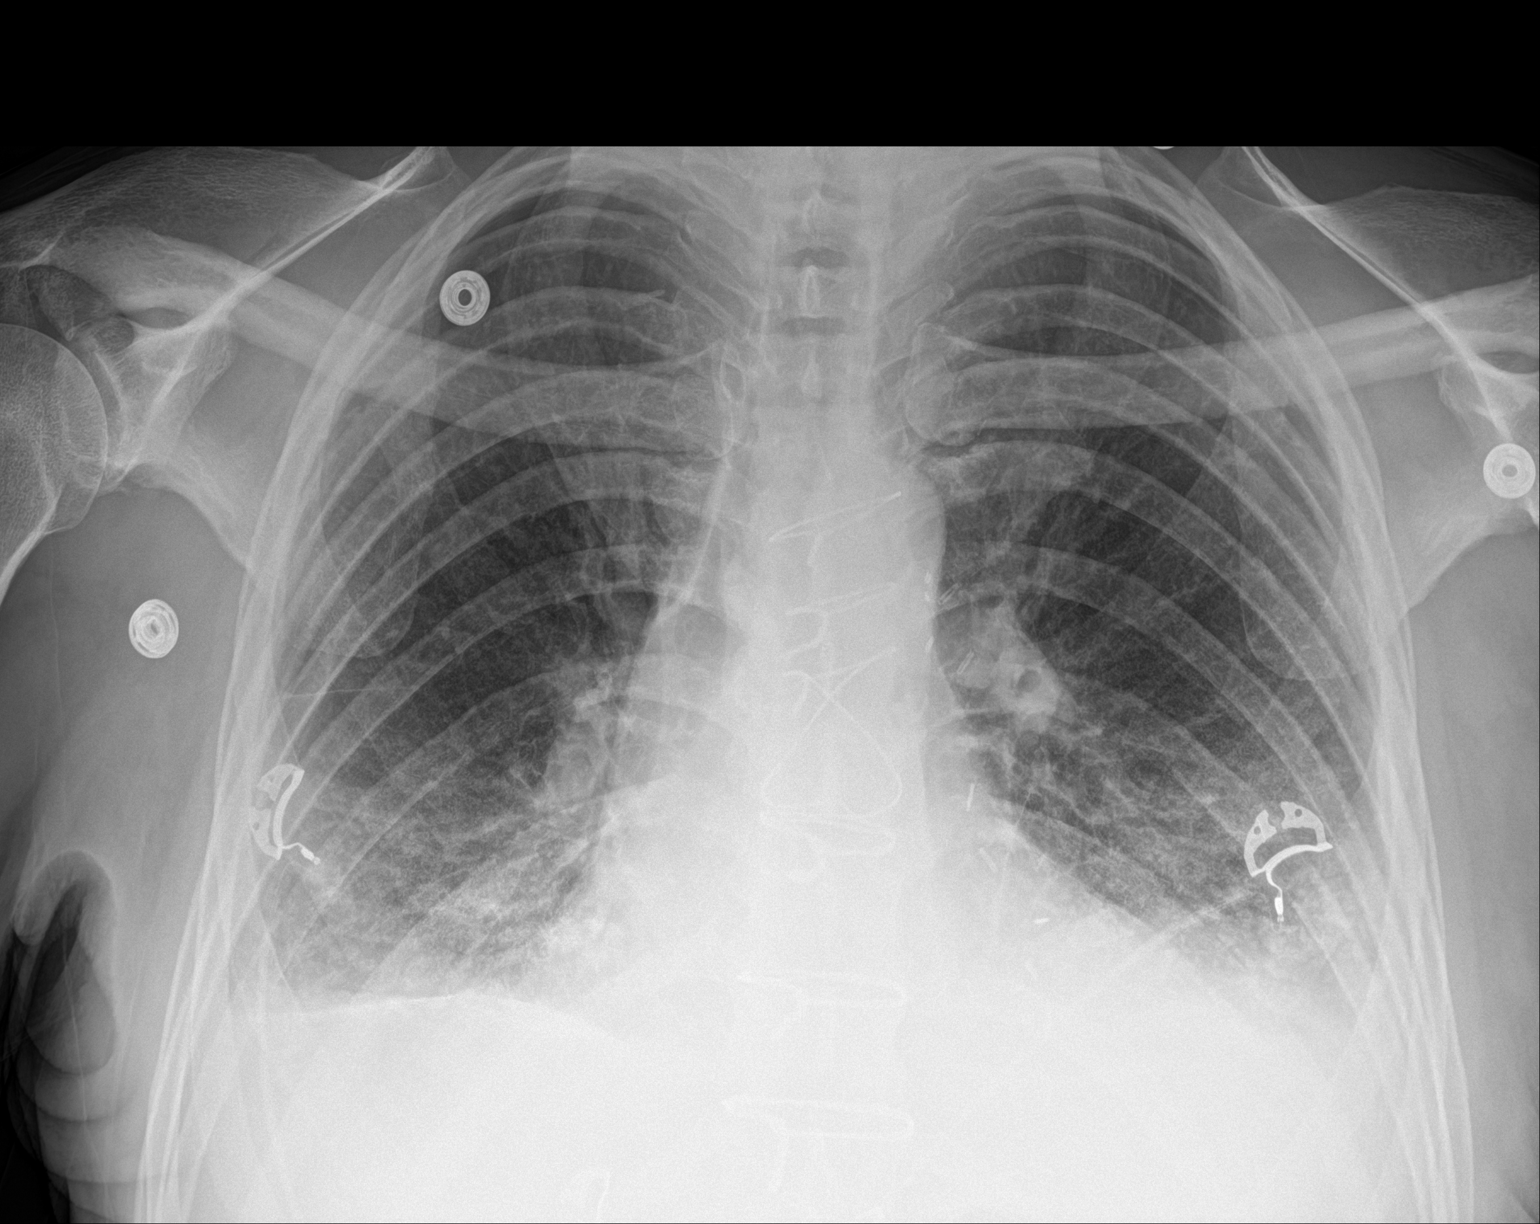

[1 of 1 positions shown; findings below may reference images not displayed]

FINDINGS: Grossly unchanged cardiac silhouette and mediastinal contours post
median sternotomy. Pulmonary vasculature remains indistinct with
cephalization of flow. Grossly unchanged small layering bilateral
effusions with associated bibasilar opacities favored to represent
atelectasis. No new focal airspace opacities. No pneumothorax. No
acute osseous abnormalities.
IMPRESSION: Grossly unchanged findings of pulmonary edema, small layering
bilateral effusions and associated bibasilar atelectasis.

## 2022-09-11 ENCOUNTER — Ambulatory Visit (INDEPENDENT_AMBULATORY_CARE_PROVIDER_SITE_OTHER): Payer: 59 | Admitting: Family

## 2022-09-11 DIAGNOSIS — M86272 Subacute osteomyelitis, left ankle and foot: Secondary | ICD-10-CM

## 2022-09-12 ENCOUNTER — Encounter: Payer: Self-pay | Admitting: Family

## 2022-09-12 MED ORDER — OXYCODONE-ACETAMINOPHEN 5-325 MG PO TABS
1.0000 | ORAL_TABLET | Freq: Four times a day (QID) | ORAL | 0 refills | Status: DC | PRN
Start: 2022-09-12 — End: 2023-02-04
  Filled 2022-09-12: qty 30, 8d supply, fill #0

## 2022-09-12 NOTE — Progress Notes (Signed)
Post-Op Visit Note   Patient: Curtis Watkins           Date of Birth: [birth year]           MRN: [id number] Visit Date: 09/11/2022 PCP: Rema Fendt, NP  Chief Complaint:  Chief Complaint  Patient presents with   Left Foot - Routine Post Op    09/04/2022 left 1st ray amputation     HPI:  HPI The patient is a 67 year old gentleman seen status post left first ray amputation July 12.  No concerns voiced today. Ortho Exam On examination of the left foot the ray amputation is approximated sutures distally there is an open area which is nickel sized with bloody drainage there is 3 mm of depth today.  No exposed bone or tendon.  Visit Diagnoses: No diagnosis found.  Plan: Begin daily Dial soap cleansing dry dressings nonweightbearing elevate for edema.  Follow-up in 2 weeks.  Follow-Up Instructions: Return in about 2 weeks (around 09/25/2022).   Imaging: No results found.  Orders:  No orders of the defined types were placed in this encounter.  No orders of the defined types were placed in this encounter.    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   Coronary artery disease involving native coronary artery of native heart with unstable angina pectoris (HCC)    Acute congestive heart failure (HCC) 03/27/2020   Diabetic foot ulcers (HCC) 03/27/2020   DM2 (diabetes mellitus, type 2) (HCC) 03/27/2020   HTN  (hypertension) 03/27/2020   Past Medical History:  Diagnosis Date   Blind right eye    Carotid artery disease (HCC)    04/01/20: 60-79% RICA stenosis, 1-39% LICA stenosis   Chronic kidney disease    ckd stage 3 a per dr Ronald Lobo 02-05-2021 epic   Coronary artery disease    NSTEMI s/p CABG in 03/2020 (LIMA-->LAD/diag, L radial-->OM1/OM3); b.   DM type 2    Glaucoma of right eye, unspecified glaucoma type    HFrEF (heart failure with reduced ejection fraction) (HCC)    Echo 6/22: EF 45, global HK, mild MR, RVSP 31.9   Hypertension    Ischemic cardiomyopathy    PAD (peripheral artery disease) (HCC)    occluded right axillary artery 03/29/20     Family History  Problem Relation Age of Onset   Diabetes Mother     Past Surgical History:  Procedure Laterality Date   AMPUTATION Right 04/24/2020   Procedure: RIGHT 2ND AND 3RD TOE AMPUTATION;  Surgeon: Nadara Mustard, MD;  Location: [facility];  Service: Orthopedics;  Laterality: Right;   AMPUTATION Right 10/22/2021   Procedure: RIGHT TRANSMETATARSAL AMPUTATION;  Surgeon: Nadara Mustard, MD;  Location: [facility] OR;  Service: Orthopedics;  Laterality: Right;   AMPUTATION Left 09/04/2022   Procedure: LEFT 1ST RAY AMPUTATION;  Surgeon: Nadara Mustard, MD;  Location: [facility] OR;  Service:  Orthopedics;  Laterality: Left;   APPLICATION OF WOUND VAC Right 10/22/2021   Procedure: APPLICATION OF WOUND VAC;  Surgeon: Nadara Mustard, MD;  [facility];  Service: Orthopedics;  Laterality: Right;   CENTRAL VENOUS CATHETER INSERTION Left 04/03/2020   Procedure: INSERTION OF FEMORAL ARTERIAL LINE;  Surgeon: Linden Dolin, MD;  [facility];  Service: Open Heart Surgery;  Laterality: Left;   CORONARY ARTERY BYPASS GRAFT N/A 04/03/2020   Procedure: CORONARY ARTERY BYPASS GRAFTING (CABG) TIMES FOUR USING LEFT INTERNAL MAMMARY ARTERY AND LEFT RADIAL ARTERY.;  Surgeon: Linden Dolin, MD;  Location: [facility];  Service: Open Heart Surgery;  Laterality: N/A;   CORONARY  PRESSURE/FFR WITH 3D MAPPING N/A 03/29/2020   Procedure: Coronary Pressure Wire/FFR w/3D Mapping;  Surgeon: Lyn Records, MD;  [facility];  Service: Cardiovascular;  Laterality: N/A;   EYE SURGERY Right 01/21/2021   right eye cataract lens replacement surgery   I & D EXTREMITY Left 07/01/2022   Procedure: LEFT FOOT DEBRIDEMENT AND FOREIGN BODY REMOVAL;  Surgeon: Nadara Mustard, MD;  Location: [facility] OR;  Service: Orthopedics;  Laterality: Left;   IR THORACENTESIS ASP PLEURAL SPACE W/IMG GUIDE  05/03/2020   IR THORACENTESIS ASP PLEURAL SPACE W/IMG GUIDE  05/16/2020   LEFT HEART CATH AND CORONARY ANGIOGRAPHY N/A 03/29/2020   Procedure: LEFT HEART CATH AND CORONARY ANGIOGRAPHY;  Surgeon: Lyn Records, MD;  [facility];  Service: Cardiovascular;  Laterality: N/A;   PENILE PROSTHESIS IMPLANT N/A 03/03/2021   Procedure: PENILE PROTHESIS INFLATABLE COLOPLAST;  Surgeon: Crista Elliot, MD;  Location: [facility];  Service: Urology;  Laterality: N/A;  REQUESTING 2 HRS   RADIAL ARTERY HARVEST Left 04/03/2020   Procedure: LEFT RADIAL ARTERY HARVEST;  Surgeon: Linden Dolin, MD;  [facility];  Service: Open Heart Surgery;  Laterality: Left;   TEE WITHOUT CARDIOVERSION N/A 04/03/2020   Procedure: TRANSESOPHAGEAL ECHOCARDIOGRAM (TEE);  Surgeon: Linden Dolin, MD;  Location: [facility] OR;  Service: Open Heart Surgery;  Laterality: N/A;   UPPER EXTREMITY ANGIOGRAPHY  03/29/2020   Procedure: Upper Extremity Angiography;  Surgeon: Lyn Records, MD;  Location: [facility] INVASIVE CV LAB;  Service: Cardiovascular;;  rt.  arm   Social History   Occupational History   Occupation: retired d/t medical conditions  Tobacco Use   Smoking status: Every Day    Current packs/day: 1.00    Average packs/day: 1 pack/day for 0.5 years (0.5 ttl pk-yrs)    Types: Cigarettes    Passive exposure: Current   Smokeless tobacco: Never  Vaping Use   Vaping status: Never Used   Substance and Sexual Activity   Alcohol use: Not Currently    Alcohol/week: 1.0 standard drink of alcohol    Types: 1 Cans of beer per week    Comment: occassionally beer   Drug use: Not Currently    Types: Marijuana    Comment: marijuana last use 3 xweek last used 02-23-2021   Sexual activity: Not Currently

## 2022-09-14 ENCOUNTER — Other Ambulatory Visit (HOSPITAL_COMMUNITY): Payer: Self-pay

## 2022-09-14 ENCOUNTER — Other Ambulatory Visit: Payer: Self-pay

## 2022-09-23 ENCOUNTER — Other Ambulatory Visit (HOSPITAL_COMMUNITY): Payer: Self-pay

## 2022-09-24 ENCOUNTER — Other Ambulatory Visit (HOSPITAL_COMMUNITY): Payer: Self-pay

## 2022-09-24 ENCOUNTER — Ambulatory Visit (INDEPENDENT_AMBULATORY_CARE_PROVIDER_SITE_OTHER): Payer: 59 | Admitting: Orthopedic Surgery

## 2022-09-24 DIAGNOSIS — Z89412 Acquired absence of left great toe: Secondary | ICD-10-CM

## 2022-10-06 ENCOUNTER — Other Ambulatory Visit (HOSPITAL_COMMUNITY): Payer: Self-pay

## 2022-10-08 ENCOUNTER — Encounter: Payer: Self-pay | Admitting: Orthopedic Surgery

## 2022-10-08 NOTE — Progress Notes (Signed)
Patient is a 66 year old gentleman who is 2 weeks status post left foot first ray amputation.  Sutures harvested today.  Nails trimmed x 4.  There is a small wound 1 x 2 cm distal plantar that is 100% granulation tissue.  Patient will cleanse with soap and water minimize weightbearing.

## 2022-10-15 ENCOUNTER — Encounter: Payer: Self-pay | Admitting: Orthopedic Surgery

## 2022-10-15 ENCOUNTER — Ambulatory Visit (INDEPENDENT_AMBULATORY_CARE_PROVIDER_SITE_OTHER): Payer: 59 | Admitting: Orthopedic Surgery

## 2022-10-15 DIAGNOSIS — Z89412 Acquired absence of left great toe: Secondary | ICD-10-CM

## 2022-10-15 NOTE — Progress Notes (Signed)
Patient is postoperative left foot first ray amputation.  He is 6 weeks out.  Patient is ambulating in regular shoewear.  There is no open wounds no drainage no cellulitis.  Patient has dorsiflexion to neutral.  Recommended Achilles stretching and stiff soled sneakers.

## 2022-10-23 ENCOUNTER — Other Ambulatory Visit (HOSPITAL_COMMUNITY): Payer: Self-pay

## 2022-11-18 ENCOUNTER — Other Ambulatory Visit (HOSPITAL_COMMUNITY): Payer: Self-pay

## 2022-12-18 ENCOUNTER — Other Ambulatory Visit (HOSPITAL_COMMUNITY): Payer: Self-pay

## 2022-12-18 DIAGNOSIS — Z794 Long term (current) use of insulin: Secondary | ICD-10-CM | POA: Diagnosis not present

## 2022-12-18 DIAGNOSIS — H2511 Age-related nuclear cataract, right eye: Secondary | ICD-10-CM | POA: Diagnosis not present

## 2022-12-18 DIAGNOSIS — E113212 Type 2 diabetes mellitus with mild nonproliferative diabetic retinopathy with macular edema, left eye: Secondary | ICD-10-CM | POA: Diagnosis not present

## 2022-12-18 MED ORDER — ERYTHROMYCIN 5 MG/GM OP OINT
1.0000 | TOPICAL_OINTMENT | Freq: Four times a day (QID) | OPHTHALMIC | 0 refills | Status: DC | PRN
  Filled 2022-12-18: qty 3.5, 7d supply, fill #0

## 2022-12-18 MED ORDER — BRIMONIDINE TARTRATE 0.2 % OP SOLN
1.0000 [drp] | Freq: Two times a day (BID) | OPHTHALMIC | 11 refills | Status: AC
  Filled 2022-12-18: qty 10, 100d supply, fill #0

## 2022-12-18 MED ORDER — DORZOLAMIDE HCL-TIMOLOL MAL 2-0.5 % OP SOLN
1.0000 [drp] | Freq: Two times a day (BID) | OPHTHALMIC | 11 refills | Status: AC
  Filled 2022-12-18: qty 10, 100d supply, fill #0

## 2022-12-22 ENCOUNTER — Other Ambulatory Visit (HOSPITAL_COMMUNITY): Payer: Self-pay

## 2022-12-22 ENCOUNTER — Other Ambulatory Visit: Payer: Self-pay

## 2022-12-25 ENCOUNTER — Other Ambulatory Visit (HOSPITAL_COMMUNITY): Payer: Self-pay

## 2023-01-06 DIAGNOSIS — R809 Proteinuria, unspecified: Secondary | ICD-10-CM | POA: Diagnosis not present

## 2023-01-06 DIAGNOSIS — N2581 Secondary hyperparathyroidism of renal origin: Secondary | ICD-10-CM | POA: Diagnosis not present

## 2023-01-06 DIAGNOSIS — I129 Hypertensive chronic kidney disease with stage 1 through stage 4 chronic kidney disease, or unspecified chronic kidney disease: Secondary | ICD-10-CM | POA: Diagnosis not present

## 2023-01-06 DIAGNOSIS — N189 Chronic kidney disease, unspecified: Secondary | ICD-10-CM | POA: Diagnosis not present

## 2023-01-06 DIAGNOSIS — N1831 Chronic kidney disease, stage 3a: Secondary | ICD-10-CM | POA: Diagnosis not present

## 2023-01-06 DIAGNOSIS — D631 Anemia in chronic kidney disease: Secondary | ICD-10-CM | POA: Diagnosis not present

## 2023-01-07 ENCOUNTER — Other Ambulatory Visit (HOSPITAL_COMMUNITY): Payer: Self-pay

## 2023-01-12 ENCOUNTER — Other Ambulatory Visit: Payer: Self-pay

## 2023-01-12 ENCOUNTER — Telehealth: Payer: Self-pay | Admitting: Family

## 2023-01-12 MED ORDER — METOPROLOL SUCCINATE ER 25 MG PO TB24: 12.5000 mg | ORAL_TABLET | Freq: Every day | ORAL | 1 refills | Status: DC

## 2023-01-20 ENCOUNTER — Other Ambulatory Visit: Payer: Self-pay

## 2023-01-20 MED ORDER — SPIRONOLACTONE 25 MG PO TABS: 12.5000 mg | ORAL_TABLET | Freq: Every day | ORAL | 1 refills | Status: DC

## 2023-01-26 ENCOUNTER — Other Ambulatory Visit: Payer: Self-pay

## 2023-01-26 ENCOUNTER — Telehealth: Payer: Self-pay | Admitting: Nurse Practitioner

## 2023-01-26 DIAGNOSIS — I5022 Chronic systolic (congestive) heart failure: Secondary | ICD-10-CM

## 2023-01-26 DIAGNOSIS — I2510 Atherosclerotic heart disease of native coronary artery without angina pectoris: Secondary | ICD-10-CM

## 2023-01-26 DIAGNOSIS — I10 Essential (primary) hypertension: Secondary | ICD-10-CM

## 2023-01-26 DIAGNOSIS — I5020 Unspecified systolic (congestive) heart failure: Secondary | ICD-10-CM

## 2023-01-26 DIAGNOSIS — I779 Disorder of arteries and arterioles, unspecified: Secondary | ICD-10-CM

## 2023-01-26 DIAGNOSIS — E1100 Type 2 diabetes mellitus with hyperosmolarity without nonketotic hyperglycemic-hyperosmolar coma (NKHHC): Secondary | ICD-10-CM

## 2023-01-26 DIAGNOSIS — N1831 Chronic kidney disease, stage 3a: Secondary | ICD-10-CM

## 2023-01-26 DIAGNOSIS — Z720 Tobacco use: Secondary | ICD-10-CM

## 2023-01-26 MED ORDER — ATORVASTATIN CALCIUM 80 MG PO TABS
80.0000 mg | ORAL_TABLET | Freq: Every day | ORAL | 1 refills | Status: DC
Start: 2023-01-26 — End: 2023-08-05

## 2023-01-26 NOTE — Telephone Encounter (Signed)
*  STAT* If patient is at the pharmacy, call can be transferred to refill team.   1. Which medications need to be refilled? (please list name of each medication and dose if known) atorvastatin (LIPITOR) 80 MG tablet  2. Which pharmacy/location (including street and city if local pharmacy) is medication to be sent to? [id number] - [place], [place]  3. Do they need a 30 day or 90 day supply? [place]

## 2023-01-28 ENCOUNTER — Ambulatory Visit: Payer: 59 | Admitting: Internal Medicine

## 2023-01-28 NOTE — Progress Notes (Unsigned)
Curtis: Curtis Watkins  Age/ Sex: 67 y.o., male   MRN/ DOB: [id number], Oct 02, 1955     PCP: Curtis Fendt, NP   Reason for Endocrinology Evaluation: Type 2 Diabetes Mellitus  Initial Endocrine Consultative Visit: 11/01/2020    PATIENT IDENTIFIER: Curtis Watkins is a 67 y.o. male with a past medical history of DM, CAD, CHF, dyslipidemia, and HTN. The patient has followed with Endocrinology clinic since 11/01/2020 for consultative assistance with management of his diabetes.  DIABETIC HISTORY:  Curtis Watkins was diagnosed with DM 2020, he has not been on insulin. His hemoglobin A1c has ranged from 6.7% in 2022, peaking at 8.1% in 2022.  DKA: never Pancreatitis: never   He was followed by Dr. Everardo All in 2022 and was lost to follow-up until his return to our clinic in 07/2022, upon then his A1c was 7.6% he was on Jardiance only, he had stopped Ozempic due to weight loss  Started glimepiride 07/2022  SUBJECTIVE:   During the last visit (07/28/2022): A1c 7.6%   Today (01/28/2023): Curtis Watkins  He checks his blood sugars occasionally  times daily. The patient has not  had hypoglycemic episodes since the last clinic visit.    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 NPDR with macular edemaon the left  Denies:  Last Eye Exam: Completed 12/18/2022  Macrovascular complications:  CAD Denies: CVA, PVD   HISTORY:  Past Medical History:  Past Medical History:  Diagnosis Date   Blind right eye    Carotid artery disease (HCC)    04/01/20: 60-79% RICA stenosis, 1-39% LICA stenosis   Chronic kidney disease    ckd stage 3 a per dr  Ronald Lobo 02-05-2021 epic   Coronary artery disease    NSTEMI s/p CABG in 03/2020 (LIMA-->LAD/diag, L radial-->OM1/OM3); b.   DM type 2    Glaucoma of right eye, unspecified glaucoma type    HFrEF (heart failure with reduced ejection fraction) (HCC)    Echo 6/22: EF 45, global HK, mild MR, RVSP 31.9   Hypertension    Ischemic cardiomyopathy    PAD (peripheral artery disease) (HCC)    occluded right axillary artery 03/29/20    Past Surgical History:  Past Surgical History:  Procedure Laterality Date   AMPUTATION Right 04/24/2020   Procedure: RIGHT 2ND AND 3RD TOE AMPUTATION;  Surgeon: Nadara Mustard, MD;  Location: [facility] OR;  Service: Orthopedics;  Laterality: Right;   AMPUTATION Right 10/22/2021   Procedure: RIGHT TRANSMETATARSAL AMPUTATION;  Surgeon: Nadara Mustard, MD;  Location: [facility] OR;  Service: Orthopedics;  Laterality: Right;   AMPUTATION Left 09/04/2022   Procedure: LEFT 1ST RAY AMPUTATION;  Surgeon: Nadara Mustard, MD;  Location: [facility] OR;  Service: Orthopedics;  Laterality: Left;   APPLICATION OF WOUND VAC Right 10/22/2021   Procedure: APPLICATION OF WOUND VAC;  Surgeon: Nadara Mustard, MD;  Location: [facility] OR;  Service: Orthopedics;  Laterality: Right;   CENTRAL VENOUS CATHETER INSERTION Left 04/03/2020   Procedure: INSERTION OF FEMORAL ARTERIAL LINE;  Surgeon: Linden Dolin, MD;  Location: [facility] OR;  Service: Open Heart Surgery;  Laterality: Left;  CORONARY ARTERY BYPASS GRAFT N/A 04/03/2020   Procedure: CORONARY ARTERY BYPASS GRAFTING (CABG) TIMES FOUR USING LEFT INTERNAL MAMMARY ARTERY AND LEFT RADIAL ARTERY.;  Surgeon: Linden Dolin, MD;  Location: [facility] OR;  Service: Open Heart Surgery;  Laterality: N/A;   CORONARY PRESSURE/FFR WITH 3D MAPPING N/A 03/29/2020   Procedure: Coronary Pressure Wire/FFR w/3D Mapping;  Surgeon: Lyn Records, MD;  Location: [facility] INVASIVE [facility] LAB;  Service: Cardiovascular;  Laterality: N/A;   EYE SURGERY Right 01/21/2021   right eye cataract lens  replacement surgery   I & D EXTREMITY Left 07/01/2022   Procedure: LEFT FOOT DEBRIDEMENT AND FOREIGN BODY REMOVAL;  Surgeon: Nadara Mustard, MD;  Location: [facility] OR;  Service: Orthopedics;  Laterality: Left;   IR THORACENTESIS ASP PLEURAL SPACE W/IMG GUIDE  05/03/2020   IR THORACENTESIS ASP PLEURAL SPACE W/IMG GUIDE  05/16/2020   LEFT HEART CATH AND CORONARY ANGIOGRAPHY N/A 03/29/2020   Procedure: LEFT HEART CATH AND CORONARY ANGIOGRAPHY;  Surgeon: Lyn Records, MD;  [facility];  Service: Cardiovascular;  Laterality: N/A;   PENILE PROSTHESIS IMPLANT N/A 03/03/2021   Procedure: PENILE PROTHESIS INFLATABLE COLOPLAST;  Surgeon: Crista Elliot, MD;  Location[facility];  Service: Urology;  Laterality: N/A;  REQUESTING 2 HRS   RADIAL ARTERY HARVEST Left 04/03/2020   Procedure: LEFT RADIAL ARTERY HARVEST;  Surgeon: Linden Dolin, MD;  Location: [facility] OR;  Service: Open Heart Surgery;  Laterality: Left;   TEE WITHOUT CARDIOVERSION N/A 04/03/2020   Procedure: TRANSESOPHAGEAL ECHOCARDIOGRAM (TEE);  Surgeon: Linden Dolin, MD;  Location: [facility] OR;  Service: Open Heart Surgery;  Laterality: N/A;   UPPER EXTREMITY ANGIOGRAPHY  03/29/2020   Procedure: Upper Extremity Angiography;  Surgeon: Lyn Records, MD;  Location: [facility] INVASIVE CV LAB;  Service: Cardiovascular;;  rt.  arm   Social History:  reports that he has been smoking cigarettes. He has a 0.5 pack-year smoking history. He has been exposed to tobacco smoke. He has never used smokeless tobacco. He reports that he does not currently use alcohol after a past usage of about 1.0 standard drink of alcohol per week. He reports that he does not currently use drugs after having used the following drugs: Marijuana. Family History:  Family History  Problem Relation Age of Onset   Diabetes Mother      HOME MEDICATIONS: Allergies as of 01/28/2023   No Known Allergies      Medication List        Accurate as of  January 28, 2023  7:17 AM. If you have any questions, ask your nurse or doctor.          aspirin 325 MG tablet Take 162 mg by mouth in the morning.   atorvastatin 80 MG tablet Commonly known as: LIPITOR Take 1 tablet (80 mg total) by mouth daily. Pt needs to schedule appt with provider for further refills   blood glucose meter kit and supplies by Other route as directed. Dispense based on patient and insurance preference. Use up to 2x times daily as directed. (FOR ICD-10 E10.9, E11.9).   brimonidine 0.2 % ophthalmic solution Commonly known as: ALPHAGAN Place 1 drop into the left eye 2 (two) times daily.   brimonidine 0.2 % ophthalmic solution Commonly known as: ALPHAGAN Place 1 drop into the left eye 2 (two) times daily.   dorzolamide-timolol 2-0.5 % ophthalmic solution Commonly known as: COSOPT Place 1 drop into the left eye 2 (two) times  daily.   dorzolamide-timolol 2-0.5 % ophthalmic solution Commonly known as: COSOPT Place 1 drop into the left eye 2 (two) times daily.   dorzolamide-timolol 2-0.5 % ophthalmic solution Commonly known as: COSOPT Place 1 drop into the left eye 2 (two) times daily.   empagliflozin 25 MG Tabs tablet Commonly known as: JARDIANCE Take 1 tablet (25 mg total) by mouth daily before breakfast.   erythromycin ophthalmic ointment Apply 1/2 inch strip to the right eye 4 (four) times daily as needed for eye discomfort   furosemide 20 MG tablet Commonly known as: LASIX Take 1 tablet (20 mg total) by mouth 3 (three) times a week. Take on Mondays, Wednesdays, and Fridays.   glimepiride 1 MG tablet Commonly known as: AMARYL Take 1 tablet (1 mg total) by mouth daily before breakfast.   losartan 25 MG tablet Commonly known as: COZAAR Take 1/2 tablet (12.5 mg total) by mouth daily.   metoprolol succinate 25 MG 24 hr tablet Commonly known as: Toprol XL Take 1/2 tablet (12.5 mg total) by mouth daily.   oxyCODONE-acetaminophen 5-325 MG  tablet Commonly known as: PERCOCET/ROXICET Take 1 tablet by mouth every 6 (six) hours as needed.   spironolactone 25 MG tablet Commonly known as: ALDACTONE Take 1/2 tablet (12.5 mg total) by mouth daily.         OBJECTIVE:   Vital Signs: There were no vitals taken for this visit.  Wt Readings from Last 3 Encounters:  09/04/22 160 lb (72.6 kg)  07/28/22 163 lb (73.9 kg)  07/14/22 160 lb 12.8 oz (72.9 kg)     Exam: General: Pt appears well and is in NAD  Neck: General: Supple without adenopathy. Thyroid: Thyroid size normal.  No goiter or nodules appreciated.   Lungs: Clear with good BS bilat   Heart: RRR   Extremities: No pretibial edema.   Neuro: MS is good with appropriate affect, pt is alert and Ox3    DM foot exam: 07/28/2022  Left Boot in place  Right forefoot amputation noted  The pedal pulses are 2+ on the right  The sensation is decreased to a screening 5.07, 10 gram monofilament on the right         DATA REVIEWED:  Lab Results  Component Value Date   HGBA1C 7.6 (A) 07/28/2022   HGBA1C 6.9 (A) 11/28/2021   HGBA1C 6.7 (A) 02/07/2021    Latest Reference Range & Units 09/04/22 06:43  Sodium 135 - 145 mmol/L 134 (L)  Potassium 3.5 - 5.1 mmol/L 4.4  Chloride 98 - 111 mmol/L 105  CO2 22 - 32 mmol/L 20 (L)  Glucose 70 - 99 mg/dL 213 (H)  BUN 8 - 23 mg/dL 26 (H)  Creatinine 0.86 - 1.24 mg/dL 5.78 (H)  Calcium 8.9 - 10.3 mg/dL 8.4 (L)  Anion gap 5 - 15  9  GFR, Estimated >60 mL/min 50 (L)     07/07/22 09:04  MICROALB/CREAT RATIO 119 (E)  (E): External lab result    ASSESSMENT / PLAN / RECOMMENDATIONS:   1) Type 2 Diabetes Mellitus, Sub-optimally controlled, With neuropathic, CKD III, and macrovascular complications - Most recent A1c of 7.6 %. Goal A1c < 7.0 %.     -Patient on Jardiance only, A1c above goal -He stopped Ozempic due to extreme weight loss -I have recommended starting glimepiride, emphasized importance of taking it 15-20 minutes  before breakfast, discussed risk of hypoglycemia and to notify the office -Patient has dropped off forms for diabetic shoes somewhere or no  charge trach and is wondering if I need to give him anything for that, I did explain to the patient that typically we receive forms that have to be signed and certified, he just need to make sure that he gives him my information so they will be able to fax it to [place]   MEDICATIONS: Start glimepiride 1 mg, 1 tablet before breakfast Continue Jardiance 25 mg, 1 tablet before breakfast  EDUCATION / INSTRUCTIONS: BG monitoring instructions: Patient is instructed to check his blood sugars 1 times a day. Call [place] [place] [facility] if: BG persistently < 70  I reviewed the Rule of 15 for the treatment of hypoglycemia in detail with the patient. Literature supplied.    2) Diabetic complications:  Eye: Does  have known diabetic retinopathy.  Neuro/ Feet: Does  have known diabetic peripheral neuropathy .  Renal: Patient does  have known baseline CKD.    2) Dyslipidemia :    - LDL at goal 35 mg/DL - Pt on atorvastatin 80 mg daily per cardiology    F/U in 6 months      Signed electronically by: Lyndle Herrlich, MD  [facility]  [facility] [address] [place] [place], [place] [address] Phone: [phone] FAX: [phone]   CC: Curtis Fendt, NP [address] [place] [address] Phone: [phone]  Fax: [phone]  Return to Endocrinology clinic as below: Future Appointments  Date Time Provider Department Center  01/28/2023 10:30 AM Vicent Febles, Konrad Dolores, MD [id number] None  02/02/2023  2:45 PM Nadara Mustard, MD [facility] None  08/03/2023 10:00 AM [facility]

## 2023-01-29 ENCOUNTER — Encounter (HOSPITAL_COMMUNITY): Payer: Self-pay

## 2023-01-29 DIAGNOSIS — M7989 Other specified soft tissue disorders: Secondary | ICD-10-CM | POA: Diagnosis not present

## 2023-01-29 DIAGNOSIS — R509 Fever, unspecified: Secondary | ICD-10-CM | POA: Diagnosis not present

## 2023-01-29 DIAGNOSIS — F17210 Nicotine dependence, cigarettes, uncomplicated: Secondary | ICD-10-CM | POA: Diagnosis not present

## 2023-01-29 DIAGNOSIS — Z1152 Encounter for screening for COVID-19: Secondary | ICD-10-CM | POA: Diagnosis not present

## 2023-01-29 DIAGNOSIS — R6521 Severe sepsis with septic shock: Secondary | ICD-10-CM | POA: Diagnosis not present

## 2023-01-29 DIAGNOSIS — E1151 Type 2 diabetes mellitus with diabetic peripheral angiopathy without gangrene: Secondary | ICD-10-CM | POA: Diagnosis not present

## 2023-01-29 DIAGNOSIS — A419 Sepsis, unspecified organism: Secondary | ICD-10-CM | POA: Diagnosis not present

## 2023-01-29 DIAGNOSIS — Z743 Need for continuous supervision: Secondary | ICD-10-CM | POA: Diagnosis not present

## 2023-01-29 DIAGNOSIS — I5020 Unspecified systolic (congestive) heart failure: Secondary | ICD-10-CM | POA: Diagnosis not present

## 2023-01-29 DIAGNOSIS — M869 Osteomyelitis, unspecified: Secondary | ICD-10-CM | POA: Diagnosis not present

## 2023-01-29 DIAGNOSIS — R1111 Vomiting without nausea: Secondary | ICD-10-CM | POA: Diagnosis not present

## 2023-01-29 DIAGNOSIS — R6889 Other general symptoms and signs: Secondary | ICD-10-CM | POA: Diagnosis not present

## 2023-01-29 DIAGNOSIS — N189 Chronic kidney disease, unspecified: Secondary | ICD-10-CM | POA: Diagnosis not present

## 2023-01-29 DIAGNOSIS — E1169 Type 2 diabetes mellitus with other specified complication: Secondary | ICD-10-CM | POA: Diagnosis not present

## 2023-01-29 DIAGNOSIS — E1122 Type 2 diabetes mellitus with diabetic chronic kidney disease: Secondary | ICD-10-CM | POA: Diagnosis not present

## 2023-01-29 DIAGNOSIS — B348 Other viral infections of unspecified site: Secondary | ICD-10-CM | POA: Diagnosis not present

## 2023-01-30 ENCOUNTER — Encounter (HOSPITAL_COMMUNITY): Payer: Self-pay | Admitting: Internal Medicine

## 2023-01-30 ENCOUNTER — Other Ambulatory Visit: Payer: Self-pay

## 2023-01-30 ENCOUNTER — Inpatient Hospital Stay (HOSPITAL_COMMUNITY)
Admission: EM | Admit: 2023-01-30 | Discharge: 2023-02-04 | DRG: 854 | Disposition: A | Discharge status: Home / Self Care | Payer: 59 | Source: Other Acute Inpatient Hospital | Attending: Internal Medicine | Admitting: Internal Medicine

## 2023-01-30 DIAGNOSIS — E1169 Type 2 diabetes mellitus with other specified complication: Secondary | ICD-10-CM | POA: Diagnosis not present

## 2023-01-30 DIAGNOSIS — I5022 Chronic systolic (congestive) heart failure: Secondary | ICD-10-CM | POA: Diagnosis not present

## 2023-01-30 DIAGNOSIS — I2510 Atherosclerotic heart disease of native coronary artery without angina pectoris: Secondary | ICD-10-CM | POA: Diagnosis present

## 2023-01-30 DIAGNOSIS — Z5986 Financial insecurity: Secondary | ICD-10-CM

## 2023-01-30 DIAGNOSIS — L03116 Cellulitis of left lower limb: Secondary | ICD-10-CM | POA: Diagnosis not present

## 2023-01-30 DIAGNOSIS — Z79899 Other long term (current) drug therapy: Secondary | ICD-10-CM

## 2023-01-30 DIAGNOSIS — I10 Essential (primary) hypertension: Secondary | ICD-10-CM | POA: Diagnosis present

## 2023-01-30 DIAGNOSIS — I96 Gangrene, not elsewhere classified: Secondary | ICD-10-CM

## 2023-01-30 DIAGNOSIS — H409 Unspecified glaucoma: Secondary | ICD-10-CM | POA: Diagnosis present

## 2023-01-30 DIAGNOSIS — E785 Hyperlipidemia, unspecified: Secondary | ICD-10-CM | POA: Diagnosis present

## 2023-01-30 DIAGNOSIS — F17210 Nicotine dependence, cigarettes, uncomplicated: Secondary | ICD-10-CM | POA: Diagnosis present

## 2023-01-30 DIAGNOSIS — Z7984 Long term (current) use of oral hypoglycemic drugs: Secondary | ICD-10-CM | POA: Diagnosis not present

## 2023-01-30 DIAGNOSIS — E1151 Type 2 diabetes mellitus with diabetic peripheral angiopathy without gangrene: Secondary | ICD-10-CM | POA: Diagnosis present

## 2023-01-30 DIAGNOSIS — Z881 Allergy status to other antibiotic agents status: Secondary | ICD-10-CM

## 2023-01-30 DIAGNOSIS — N189 Chronic kidney disease, unspecified: Secondary | ICD-10-CM | POA: Diagnosis not present

## 2023-01-30 DIAGNOSIS — I5020 Unspecified systolic (congestive) heart failure: Secondary | ICD-10-CM | POA: Diagnosis present

## 2023-01-30 DIAGNOSIS — I130 Hypertensive heart and chronic kidney disease with heart failure and stage 1 through stage 4 chronic kidney disease, or unspecified chronic kidney disease: Secondary | ICD-10-CM | POA: Diagnosis present

## 2023-01-30 DIAGNOSIS — M869 Osteomyelitis, unspecified: Secondary | ICD-10-CM

## 2023-01-30 DIAGNOSIS — Z951 Presence of aortocoronary bypass graft: Secondary | ICD-10-CM

## 2023-01-30 DIAGNOSIS — I252 Old myocardial infarction: Secondary | ICD-10-CM | POA: Diagnosis not present

## 2023-01-30 DIAGNOSIS — E1122 Type 2 diabetes mellitus with diabetic chronic kidney disease: Secondary | ICD-10-CM | POA: Diagnosis not present

## 2023-01-30 DIAGNOSIS — R6521 Severe sepsis with septic shock: Secondary | ICD-10-CM | POA: Diagnosis not present

## 2023-01-30 DIAGNOSIS — Z89412 Acquired absence of left great toe: Secondary | ICD-10-CM

## 2023-01-30 DIAGNOSIS — L02612 Cutaneous abscess of left foot: Secondary | ICD-10-CM | POA: Diagnosis present

## 2023-01-30 DIAGNOSIS — Z7982 Long term (current) use of aspirin: Secondary | ICD-10-CM

## 2023-01-30 DIAGNOSIS — H5461 Unqualified visual loss, right eye, normal vision left eye: Secondary | ICD-10-CM | POA: Diagnosis present

## 2023-01-30 DIAGNOSIS — R509 Fever, unspecified: Secondary | ICD-10-CM | POA: Diagnosis not present

## 2023-01-30 DIAGNOSIS — E1165 Type 2 diabetes mellitus with hyperglycemia: Secondary | ICD-10-CM | POA: Diagnosis not present

## 2023-01-30 DIAGNOSIS — N183 Chronic kidney disease, stage 3 unspecified: Secondary | ICD-10-CM | POA: Diagnosis not present

## 2023-01-30 DIAGNOSIS — N1832 Chronic kidney disease, stage 3b: Secondary | ICD-10-CM | POA: Diagnosis not present

## 2023-01-30 DIAGNOSIS — B348 Other viral infections of unspecified site: Secondary | ICD-10-CM | POA: Diagnosis not present

## 2023-01-30 DIAGNOSIS — E119 Type 2 diabetes mellitus without complications: Secondary | ICD-10-CM

## 2023-01-30 DIAGNOSIS — B9789 Other viral agents as the cause of diseases classified elsewhere: Secondary | ICD-10-CM | POA: Diagnosis present

## 2023-01-30 DIAGNOSIS — M86172 Other acute osteomyelitis, left ankle and foot: Secondary | ICD-10-CM | POA: Diagnosis not present

## 2023-01-30 DIAGNOSIS — Z833 Family history of diabetes mellitus: Secondary | ICD-10-CM

## 2023-01-30 DIAGNOSIS — R600 Localized edema: Secondary | ICD-10-CM | POA: Diagnosis not present

## 2023-01-30 DIAGNOSIS — L0390 Cellulitis, unspecified: Secondary | ICD-10-CM | POA: Diagnosis not present

## 2023-01-30 DIAGNOSIS — Z1152 Encounter for screening for COVID-19: Secondary | ICD-10-CM

## 2023-01-30 DIAGNOSIS — A419 Sepsis, unspecified organism: Principal | ICD-10-CM | POA: Insufficient documentation

## 2023-01-30 DIAGNOSIS — I255 Ischemic cardiomyopathy: Secondary | ICD-10-CM | POA: Diagnosis not present

## 2023-01-30 DIAGNOSIS — Z89421 Acquired absence of other right toe(s): Secondary | ICD-10-CM

## 2023-01-30 LAB — COMPREHENSIVE METABOLIC PANEL
ALT: 13 U/L (ref 0–44)
AST: 16 U/L (ref 15–41)
Albumin: 2.8 g/dL — ABNORMAL LOW (ref 3.5–5.0)
Alkaline Phosphatase: 62 U/L (ref 38–126)
Anion gap: 9 (ref 5–15)
BUN: 31 mg/dL — ABNORMAL HIGH (ref 8–23)
CO2: 19 mmol/L — ABNORMAL LOW (ref 22–32)
Calcium: 8.2 mg/dL — ABNORMAL LOW (ref 8.9–10.3)
Chloride: 107 mmol/L (ref 98–111)
Creatinine, Ser: 1.74 mg/dL — ABNORMAL HIGH (ref 0.61–1.24)
GFR, Estimated: 42 mL/min — ABNORMAL LOW (ref >60)
Glucose, Bld: 184 mg/dL — ABNORMAL HIGH (ref 70–99)
Potassium: 3.8 mmol/L (ref 3.5–5.1)
Sodium: 135 mmol/L (ref 135–145)
Total Bilirubin: 0.7 mg/dL (ref <1.2)
Total Protein: 6.3 g/dL — ABNORMAL LOW (ref 6.5–8.1)

## 2023-01-30 LAB — SARS CORONAVIRUS 2 BY RT PCR: SARS Coronavirus 2 by RT PCR: NEGATIVE

## 2023-01-30 LAB — RESPIRATORY PANEL BY PCR

## 2023-01-30 LAB — CBC
HCT: 37.6 % — ABNORMAL LOW (ref 39.0–52.0)
Hemoglobin: 12.6 g/dL — ABNORMAL LOW (ref 13.0–17.0)
MCH: 31.7 pg (ref 26.0–34.0)
MCHC: 33.5 g/dL (ref 30.0–36.0)
MCV: 94.5 fL (ref 80.0–100.0)
Platelets: 167 10*3/uL (ref 150–400)
RBC: 3.98 MIL/uL — ABNORMAL LOW (ref 4.22–5.81)
RDW: 13.8 % (ref 11.5–15.5)
WBC: 19.6 10*3/uL — ABNORMAL HIGH (ref 4.0–10.5)
nRBC: 0 % (ref 0.0–0.2)

## 2023-01-30 LAB — GLUCOSE, CAPILLARY
Glucose-Capillary: 175 mg/dL — ABNORMAL HIGH (ref 70–99)
Glucose-Capillary: 227 mg/dL — ABNORMAL HIGH (ref 70–99)

## 2023-01-30 LAB — HEMOGLOBIN A1C
Hgb A1c MFr Bld: 7.6 % — ABNORMAL HIGH (ref 4.8–5.6)
Mean Plasma Glucose: 171.42 mg/dL

## 2023-01-30 MED ORDER — HYDROMORPHONE HCL 1 MG/ML IJ SOLN: 0.5000 mg | INTRAMUSCULAR | Status: DC | PRN

## 2023-01-30 MED ORDER — ATORVASTATIN CALCIUM 10 MG PO TABS
20.0000 mg | ORAL_TABLET | Freq: Every day | ORAL | Status: DC
  Administered 2023-01-30 – 2023-02-04 (×5): 20 mg via ORAL
  Filled 2023-01-30 (×7): qty 2

## 2023-01-30 MED ORDER — POLYETHYLENE GLYCOL 3350 17 G PO PACK: 17.0000 g | PACK | Freq: Every day | ORAL | Status: DC | PRN

## 2023-01-30 MED ORDER — DAPTOMYCIN-SODIUM CHLORIDE 700-0.9 MG/100ML-% IV SOLN
700.0000 mg | Freq: Every day | INTRAVENOUS | Status: DC
Start: 2023-01-31 — End: 2023-02-04
  Administered 2023-01-31 – 2023-02-03 (×4): 700 mg via INTRAVENOUS
  Filled 2023-01-30 (×5): qty 100

## 2023-01-30 MED ORDER — METOPROLOL SUCCINATE ER 25 MG PO TB24
12.5000 mg | ORAL_TABLET | Freq: Every day | ORAL | Status: DC
  Administered 2023-01-30 – 2023-02-04 (×6): 12.5 mg via ORAL
  Filled 2023-01-30 (×6): qty 1

## 2023-01-30 MED ORDER — ATORVASTATIN CALCIUM 80 MG PO TABS: 80.0000 mg | ORAL_TABLET | Freq: Every day | ORAL | Status: DC

## 2023-01-30 MED ORDER — ASPIRIN 325 MG PO TABS
162.0000 mg | ORAL_TABLET | Freq: Every day | ORAL | Status: DC
  Administered 2023-01-30 – 2023-02-04 (×5): 162 mg via ORAL
  Filled 2023-01-30 (×6): qty 1

## 2023-01-30 MED ORDER — LOSARTAN POTASSIUM 25 MG PO TABS
12.5000 mg | ORAL_TABLET | Freq: Every day | ORAL | Status: DC
  Administered 2023-01-30 – 2023-02-02 (×4): 12.5 mg via ORAL
  Filled 2023-01-30 (×4): qty 1

## 2023-01-30 MED ORDER — SODIUM CHLORIDE 0.9 % IV SOLN
2.0000 g | Freq: Two times a day (BID) | INTRAVENOUS | Status: DC
  Administered 2023-01-30 – 2023-02-04 (×10): 2 g via INTRAVENOUS
  Filled 2023-01-30 (×10): qty 12.5

## 2023-01-30 MED ORDER — OXYCODONE-ACETAMINOPHEN 5-325 MG PO TABS: 1.0000 | ORAL_TABLET | Freq: Four times a day (QID) | ORAL | Status: DC | PRN

## 2023-01-30 MED ORDER — HEPARIN SODIUM (PORCINE) 5000 UNIT/ML IJ SOLN
5000.0000 [IU] | Freq: Three times a day (TID) | INTRAMUSCULAR | Status: DC
  Administered 2023-01-30 – 2023-02-04 (×12): 5000 [IU] via SUBCUTANEOUS
  Filled 2023-01-30 (×12): qty 1

## 2023-01-30 MED ORDER — INSULIN ASPART 100 UNIT/ML IJ SOLN
0.0000 [IU] | Freq: Three times a day (TID) | INTRAMUSCULAR | Status: DC
  Administered 2023-01-31: 3 [IU] via SUBCUTANEOUS
  Administered 2023-01-31 – 2023-02-01 (×2): 2 [IU] via SUBCUTANEOUS
  Administered 2023-02-02: 3 [IU] via SUBCUTANEOUS
  Administered 2023-02-02: 2 [IU] via SUBCUTANEOUS
  Administered 2023-02-02: 3 [IU] via SUBCUTANEOUS
  Administered 2023-02-03: 2 [IU] via SUBCUTANEOUS
  Administered 2023-02-04: 5 [IU] via SUBCUTANEOUS

## 2023-01-30 MED ORDER — SPIRONOLACTONE 12.5 MG HALF TABLET
12.5000 mg | ORAL_TABLET | Freq: Every day | ORAL | Status: DC
  Administered 2023-01-30 – 2023-02-02 (×4): 12.5 mg via ORAL
  Filled 2023-01-30 (×4): qty 1

## 2023-01-30 MED ORDER — DORZOLAMIDE HCL-TIMOLOL MAL 2-0.5 % OP SOLN
1.0000 [drp] | Freq: Two times a day (BID) | OPHTHALMIC | Status: DC
  Administered 2023-01-31 – 2023-02-04 (×9): 1 [drp] via OPHTHALMIC
  Filled 2023-01-30: qty 10

## 2023-01-30 NOTE — H&P (Signed)
History and Physical    Curtis Watkins:[id number] DOB: [birth year] DOA: 01/30/2023  PCP: Rema Fendt, NP   Patient coming from: Home .Transferred from [facility]    Chief Complaint: Fever, rigors, chills  HPI: Curtis Watkins is a 67 y.o. male with medical history significant of left foot wound diabetic foot ulcer ,status post left-sided first ray amputation by Dr. Lajoyce Corners on 08/2022, diabetes type 2, hyperlipidemia, CKD stage IIIb, hypertension, chronic systolic congestive heart failure with EF of 20 to 25% who presented initially at [facility] with complaint of fever, rigors, chills.  About 2 weeks ago, he started having some drainage and pain on the left foot where he had surgery.  He called Dr. Audrie Lia office and he was scheduled for appointment on coming Tuesday.  Started having fever, chills about a day ago. During the stay in the emergency department, he became hypotensive but responded well to the IV fluid.  On presentation, lab work showed leukocytosis of 12.9 creatinine of 1.6, potassium of 5.1.  Respiratory viral panel came out a positive for rhinovirus.  X-ray of the left foot was suspicious for osteomyelitis of anterior navicular bone.  Osteomyelitis suspected.  Started on broad spectrum antibiotics with vancomycin and cefepime.  Patient also noted to have low-grade temperature.  Patient transferred here for further management of sepsis secondary to left-sided osteomyelitis. Patient seen and examined the floor today.  During my evaluation, he was hemodynamically stable, mildly hypertensive.  Afebrile.  He was very comfortable and says he wants to go home.  Denies any significant pain on the foot. As per the report, rhinovirus came out to be positive at [facility].  He denies any respiratory symptoms.  No shortness of breath, cough, abdominal pain, nausea, vomiting, diarrhea or dysuria.    Review of Systems: As per HPI otherwise 10 point review of systems negative.     Past Medical History:  Diagnosis Date   Blind right eye    Carotid artery disease (HCC)    04/01/20: 60-79% RICA stenosis, 1-39% LICA stenosis   Chronic kidney disease    ckd stage 3 a per dr Ronald Lobo 02-05-2021 epic   Coronary artery disease    NSTEMI s/p CABG in 03/2020 (LIMA-->LAD/diag, L radial-->OM1/OM3); b.   DM type 2    Glaucoma of right eye, unspecified glaucoma type    HFrEF (heart failure with reduced ejection fraction) (HCC)    Echo 6/22: EF 45, global HK, mild MR, RVSP 31.9   Hypertension    Ischemic cardiomyopathy    PAD (peripheral artery disease) (HCC)    occluded right axillary artery 03/29/20     Past Surgical History:  Procedure Laterality Date   AMPUTATION Right 04/24/2020   Procedure: RIGHT 2ND AND 3RD TOE AMPUTATION;  Surgeon: Nadara Mustard, MD;  Location: [facility] OR;  Service: Orthopedics;  Laterality: Right;   AMPUTATION Right 10/22/2021   Procedure: RIGHT TRANSMETATARSAL AMPUTATION;  Surgeon: Nadara Mustard, MD;  Location: [facility] OR;  Service: Orthopedics;  Laterality: Right;   AMPUTATION Left 09/04/2022   Procedure: LEFT 1ST RAY AMPUTATION;  Surgeon: Nadara Mustard, MD;  Location: [facility] OR;  Service: Orthopedics;  Laterality: Left;   APPLICATION OF WOUND VAC Right 10/22/2021   Procedure: APPLICATION OF WOUND VAC;  Surgeon: Nadara Mustard, MD;  Location: [facility] OR;  Service: Orthopedics;  Laterality: Right;   CENTRAL VENOUS CATHETER INSERTION Left 04/03/2020   Procedure: INSERTION OF FEMORAL ARTERIAL LINE;  Surgeon: Linden Dolin,  MD;  Location: [facility] OR;  Service: Open Heart Surgery;  Laterality: Left;   CORONARY ARTERY BYPASS GRAFT N/A 04/03/2020   Procedure: CORONARY ARTERY BYPASS GRAFTING (CABG) TIMES FOUR USING LEFT INTERNAL MAMMARY ARTERY AND LEFT RADIAL ARTERY.;  Surgeon: Linden Dolin, MD;  Location: [facility] OR;  Service: Open Heart Surgery;  Laterality: N/A;   CORONARY PRESSURE/FFR WITH 3D MAPPING N/A 03/29/2020   Procedure: Coronary Pressure Wire/FFR w/3D  Mapping;  Surgeon: Lyn Records, MD;  Location: [facility] INVASIVE CV LAB;  Service: Cardiovascular;  Laterality: N/A;   EYE SURGERY Right 01/21/2021   right eye cataract lens replacement surgery   I & D EXTREMITY Left 07/01/2022   Procedure: LEFT FOOT DEBRIDEMENT AND FOREIGN BODY REMOVAL;  Surgeon: Nadara Mustard, MD;  Location: [facility] OR;  Service: Orthopedics;  Laterality: Left;   IR THORACENTESIS ASP PLEURAL SPACE W/IMG GUIDE  05/03/2020   IR THORACENTESIS ASP PLEURAL SPACE W/IMG GUIDE  05/16/2020   LEFT HEART CATH AND CORONARY ANGIOGRAPHY N/A 03/29/2020   Procedure: LEFT HEART CATH AND CORONARY ANGIOGRAPHY;  Surgeon: Lyn Records, MD;  Location: [facility] INVASIVE CV LAB;  Service: Cardiovascular;  Laterality: N/A;   PENILE PROSTHESIS IMPLANT N/A 03/03/2021   Procedure: PENILE PROTHESIS INFLATABLE COLOPLAST;  Surgeon: Crista Elliot, MD;  Location: [facility];  Service: Urology;  Laterality: N/A;  REQUESTING 2 HRS   RADIAL ARTERY HARVEST Left 04/03/2020   Procedure: LEFT RADIAL ARTERY HARVEST;  Surgeon: Linden Dolin, MD;  Location: [facility] OR;  Service: Open Heart Surgery;  Laterality: Left;   TEE WITHOUT CARDIOVERSION N/A 04/03/2020   Procedure: TRANSESOPHAGEAL ECHOCARDIOGRAM (TEE);  Surgeon: Linden Dolin, MD;  Location: [facility] OR;  Service: Open Heart Surgery;  Laterality: N/A;   UPPER EXTREMITY ANGIOGRAPHY  03/29/2020   Procedure: Upper Extremity Angiography;  Surgeon: Lyn Records, MD;  Location: [facility] INVASIVE CV LAB;  Service: Cardiovascular;;  rt.  arm     reports that he has been smoking cigarettes. He has a 0.5 pack-year smoking history. He has been exposed to tobacco smoke. He has never used smokeless tobacco. He reports that he does not currently use alcohol after a past usage of about 1.0 standard drink of alcohol per week. He reports that he does not currently use drugs after having used the following drugs: Marijuana.  No Known Allergies  Family History  Problem Relation  Age of Onset   Diabetes Mother      Prior to Admission medications   Medication Sig Start Date End Date Taking? Authorizing Provider  aspirin 325 MG tablet Take 162 mg by mouth in the morning. 04/07/20   [provider]  atorvastatin (LIPITOR) 80 MG tablet Take 1 tablet (80 mg total) by mouth daily. Pt needs to schedule appt with provider for further refills 01/26/23   Bernadene Person C, NP  blood glucose meter kit and supplies by Other route as directed. Dispense based on patient and insurance preference. Use up to 2x times daily as directed. (FOR ICD-10 E10.9, E11.9).    [provider]  brimonidine (ALPHAGAN) 0.2 % ophthalmic solution Place 1 drop into the left eye 2 (two) times daily. 05/22/22     brimonidine (ALPHAGAN) 0.2 % ophthalmic solution Place 1 drop into the left eye 2 (two) times daily. 12/18/22     dorzolamide-timolol (COSOPT) 2-0.5 % ophthalmic solution Place 1 drop into the left eye 2 (two) times daily. 05/22/22     dorzolamide-timolol (COSOPT) 2-0.5 % ophthalmic  solution Place 1 drop into the left eye 2 (two) times daily. 12/18/22     dorzolamide-timolol (COSOPT) 22.3-6.8 MG/ML ophthalmic solution Place 1 drop into the left eye 2 (two) times daily. Patient not taking: Reported on 09/01/2022 03/20/21     empagliflozin (JARDIANCE) 25 MG TABS tablet Take 1 tablet (25 mg total) by mouth daily before breakfast. 07/28/22   Shamleffer, Konrad Dolores, MD  erythromycin ophthalmic ointment Apply 1/2 inch strip to the right eye 4 (four) times daily as needed for eye discomfort 12/18/22     furosemide (LASIX) 20 MG tablet Take 1 tablet (20 mg total) by mouth 3 (three) times a week. Take on Mondays, Wednesdays, and Fridays. 05/18/22   Meriam Sprague, MD  glimepiride (AMARYL) 1 MG tablet Take 1 tablet (1 mg total) by mouth daily before breakfast. 07/28/22   Shamleffer, Konrad Dolores, MD  losartan (COZAAR) 25 MG tablet Take 1/2 tablet (12.5 mg total) by mouth daily. 03/30/22    Meriam Sprague, MD  metoprolol succinate (TOPROL XL) 25 MG 24 hr tablet Take 1/2 tablet (12.5 mg total) by mouth daily. 01/12/23   Tereso Newcomer T, PA-C  oxyCODONE-acetaminophen (PERCOCET/ROXICET) 5-325 MG tablet Take 1 tablet by mouth every 6 (six) hours as needed. 09/12/22   Adonis Huguenin, NP  spironolactone (ALDACTONE) 25 MG tablet Take 1/2 tablet (12.5 mg total) by mouth daily. 01/20/23   Gaston Islam., NP    Physical Exam: Vitals:   01/30/23 1555  BP: (!) 162/67  Pulse: 66  Resp: 19  Temp: 98.3 F (36.8 C)  TempSrc: Oral  SpO2: 100%  Weight: 76 kg  Height: 5\' 9"  (1.753 m)    Constitutional: NAD, calm, comfortable,pleasant gentleman Vitals:   01/30/23 1555  BP: (!) 162/67  Pulse: 66  Resp: 19  Temp: 98.3 F (36.8 C)  TempSrc: Oral  SpO2: 100%  Weight: 76 kg  Height: 5\' 9"  (1.753 m)   Eyes: PERRL, lids and conjunctivae normal ENMT: Mucous membranes are moist.  Neck: normal, supple, no masses, no thyromegaly Respiratory: clear to auscultation bilaterally, no wheezing, no crackles. Normal respiratory effort. No accessory muscle use.  Cardiovascular: Regular rate and rhythm, no murmurs / rubs / gallops. No extremity edema.  Abdomen: no tenderness, no masses palpated. No hepatosplenomegaly. Bowel sounds positive.  Musculoskeletal: Right metatarsal amputation, left first ray amputation, no obvious ulcer or drainage Skin: no rashes, lesions, ulcers. No induration Neurologic: CN 2-12 grossly intact.  Strength 5/5 in all 4.  Psychiatric: Normal judgment and insight. Alert and oriented x 3. Normal mood.   Foley Catheter:None  Labs on Admission: I have personally reviewed following labs and imaging studies  CBC: No results for input(s): "WBC", "NEUTROABS", "HGB", "HCT", "MCV", "PLT" in the last 168 hours. Basic Metabolic Panel: No results for input(s): "NA", "K", "CL", "CO2", "GLUCOSE", "BUN", "CREATININE", "CALCIUM", "MG", "PHOS" in the last 168  hours. GFR: CrCl cannot be calculated (Patient's most recent lab result is older than the maximum 21 days allowed.). Liver Function Tests: No results for input(s): "AST", "ALT", "ALKPHOS", "BILITOT", "PROT", "ALBUMIN" in the last 168 hours. No results for input(s): "LIPASE", "AMYLASE" in the last 168 hours. No results for input(s): "AMMONIA" in the last 168 hours. Coagulation Profile: No results for input(s): "INR", "PROTIME" in the last 168 hours. Cardiac Enzymes: No results for input(s): "CKTOTAL", "CKMB", "CKMBINDEX", "TROPONINI" in the last 168 hours. BNP (last 3 results) No results for input(s): "PROBNP" in the last 8760 hours. HbA1C: No  results for input(s): "HGBA1C" in the last 72 hours. CBG: Recent Labs  Lab 01/30/23 1558  GLUCAP 175*   Lipid Profile: No results for input(s): "CHOL", "HDL", "LDLCALC", "TRIG", "CHOLHDL", "LDLDIRECT" in the last 72 hours. Thyroid Function Tests: No results for input(s): "TSH", "T4TOTAL", "FREET4", "T3FREE", "THYROIDAB" in the last 72 hours. Anemia Panel: No results for input(s): "VITAMINB12", "FOLATE", "FERRITIN", "TIBC", "IRON", "RETICCTPCT" in the last 72 hours. Urine analysis:    Component Value Date/Time   COLORURINE YELLOW 04/02/2020 0501   APPEARANCEUR CLEAR 04/02/2020 0501   LABSPEC 1.015 04/02/2020 0501   PHURINE 6.0 04/02/2020 0501   GLUCOSEU NEGATIVE 04/02/2020 0501   HGBUR MODERATE (A) 04/02/2020 0501   BILIRUBINUR NEGATIVE 04/02/2020 0501   KETONESUR NEGATIVE 04/02/2020 0501   PROTEINUR 100 (A) 04/02/2020 0501   NITRITE NEGATIVE 04/02/2020 0501   LEUKOCYTESUR NEGATIVE 04/02/2020 0501    Radiological Exams on Admission: No results found.   Assessment/Plan Principal Problem:   Osteomyelitis of foot, left, acute (HCC) Active Problems:   DM2 (diabetes mellitus, type 2) (HCC)   HTN (hypertension)   S/P CABG x 4   HFrEF (heart failure with reduced ejection fraction) (HCC)   Sepsis due to cellulitis (HCC)  Recurrent  left foot wound/osteomyelitis:  He follows with Dr. Lajoyce Corners.  He had a wound on the great toe of left foot so he underwent  first ray amputation on 09/04/2022.  Noticed some drainage and pain about 2 weeks ago on that foot.  X-ray of the left foot done at [facility] suspicious for osteomyelitis.  Will get MRI.  Dr. Lajoyce Corners consulted Continue broad spectrum antibiotics for now.  Pharmacy suggested to  start daptomycin due to concern for "red man" syndrome with vancomycin in the past .We will get blood cultures.  Sepsis: Presented initially from home with complaint of fever, chills, rigors.  Hypotensive in emergency department at [facility] but responded to IV fluid. Continue broad-spectrum antibiotics.  Follow-up cultures. Currently afebrile, normotensive/mildly hypertensive.  Coronary artery disease: Status post CABG.  No anginal symptoms.  Takes aspirin, Lipitor  Systolic congestive heart failure: His previous echo showed EF of 25 to 30%.  He follows with cardiology.  Takes Lasix at home.  Currently Lasix on hold . He looks euvolemic also takes losartan and metoprolol which have been restarted because he is currently hypertensive.Also takes spironolactone.  History of recurrent pleural effusion.  CKD stage IIIb: Baseline creatinine around 1.6.  Currently kidney function at baseline  Type 2 diabetes: Will get A1c level.  As per the previous report, he follows with endocrinology and takes Jardiance, glimepiride.  Continue sliding's insulin for now.  Hyperlipidemia: Continue statin.  Positive rhinovirus: As per outside hospital.  He does not have any respiratory symptoms.  Lungs are clear to auscultation  Smoker: Smokes half packs a day.  Counseled for cessation.  Denies nicotine patch     Severity of Illness: The appropriate patient status for this patient is INPATIENT.   DVT prophylaxis: Heparin [place] Code Status: Full code Family Communication: None at bedside Consults called: Dr  Phil Dopp     Burnadette Pop MD [facility]  01/30/2023, 4:24 PM

## 2023-01-30 NOTE — Progress Notes (Signed)
Pharmacy Antibiotic Note  Curtis Watkins is a 67 y.o. male admitted on 01/30/2023 with  osteo .  Pharmacy has been consulted for dapto/cefepime dosing.  Pt presented to [facility] ED for osteo. 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 Subsequently, he was tx to [facility] for further management. D/w Dr Renford Dills and we will use Daptomycin here instead. Reduce his atorvastatin to 20mg  while on dapto.   Scr 1.6  Plan: Daptomycin 700mg  IV qday (~9mg /kg) Cefepime 2g IV q12 Weekly ck  Height: 5\' 9"  (175.3 cm) Weight: 76 kg (167 lb 8.8 oz) IBW/kg (Calculated) : 70.7  Temp (24hrs), Avg:98.3 F (36.8 C), Min:98.3 F (36.8 C), Max:98.3 F (36.8 C)  No results for input(s): "WBC", "CREATININE", "LATICACIDVEN", "VANCOTROUGH", "VANCOPEAK", "VANCORANDOM", "GENTTROUGH", "GENTPEAK", "GENTRANDOM", "TOBRATROUGH", "TOBRAPEAK", "TOBRARND", "AMIKACINPEAK", "AMIKACINTROU", "AMIKACIN" in the last 168 hours.  CrCl cannot be calculated (Patient's most recent lab result is older than the maximum 21 days allowed.).    No Known Allergies  Antimicrobials this admission: 12/7 vanc 2g x1 12/7 zosyn x2 12/7 cefepime>> 12/7 dapto>>  Dose adjustments this admission:   Microbiology results: 12/7 blood ([place])>>   Ulyses Southward, PharmD, [place], AAHIVP, CPP Infectious Disease Pharmacist 01/30/2023 4:52 PM

## 2023-01-31 ENCOUNTER — Inpatient Hospital Stay (HOSPITAL_COMMUNITY): Payer: 59

## 2023-01-31 DIAGNOSIS — M86172 Other acute osteomyelitis, left ankle and foot: Secondary | ICD-10-CM | POA: Diagnosis not present

## 2023-01-31 LAB — GLUCOSE, CAPILLARY
Glucose-Capillary: 86 mg/dL (ref 70–99)
Glucose-Capillary: 186 mg/dL — ABNORMAL HIGH (ref 70–99)
Glucose-Capillary: 127 mg/dL — ABNORMAL HIGH (ref 70–99)
Glucose-Capillary: 191 mg/dL — ABNORMAL HIGH (ref 70–99)

## 2023-01-31 LAB — CBC
HCT: 40.2 % (ref 39.0–52.0)
Hemoglobin: 13.5 g/dL (ref 13.0–17.0)
MCH: 31.8 pg (ref 26.0–34.0)
MCHC: 33.6 g/dL (ref 30.0–36.0)
MCV: 94.6 fL (ref 80.0–100.0)
Platelets: 184 10*3/uL (ref 150–400)
RBC: 4.25 MIL/uL (ref 4.22–5.81)
RDW: 13.8 % (ref 11.5–15.5)
WBC: 14.4 10*3/uL — ABNORMAL HIGH (ref 4.0–10.5)
nRBC: 0 % (ref 0.0–0.2)

## 2023-01-31 LAB — BASIC METABOLIC PANEL
Anion gap: 9 (ref 5–15)
BUN: 25 mg/dL — ABNORMAL HIGH (ref 8–23)
CO2: 21 mmol/L — ABNORMAL LOW (ref 22–32)
Calcium: 8.9 mg/dL (ref 8.9–10.3)
Chloride: 107 mmol/L (ref 98–111)
Creatinine, Ser: 1.6 mg/dL — ABNORMAL HIGH (ref 0.61–1.24)
GFR, Estimated: 47 mL/min — ABNORMAL LOW (ref >60)
Glucose, Bld: 98 mg/dL (ref 70–99)
Potassium: 4.3 mmol/L (ref 3.5–5.1)
Sodium: 137 mmol/L (ref 135–145)

## 2023-01-31 LAB — CK: Total CK: 91 U/L (ref 49–397)

## 2023-01-31 MED ORDER — TRAZODONE HCL 50 MG PO TABS: 50.0000 mg | ORAL_TABLET | Freq: Every evening | ORAL | Status: DC | PRN

## 2023-01-31 MED ORDER — HYDRALAZINE HCL 20 MG/ML IJ SOLN
10.0000 mg | INTRAMUSCULAR | Status: DC | PRN
  Administered 2023-01-31 – 2023-02-01 (×2): 10 mg via INTRAVENOUS
  Filled 2023-01-31 (×2): qty 1

## 2023-01-31 MED ORDER — ONDANSETRON HCL 4 MG/2ML IJ SOLN
4.0000 mg | Freq: Four times a day (QID) | INTRAMUSCULAR | Status: DC | PRN
  Administered 2023-02-03: 4 mg via INTRAVENOUS

## 2023-01-31 MED ORDER — METOPROLOL TARTRATE 5 MG/5ML IV SOLN: 5.0000 mg | INTRAVENOUS | Status: DC | PRN

## 2023-01-31 MED ORDER — SENNOSIDES-DOCUSATE SODIUM 8.6-50 MG PO TABS: 1.0000 | ORAL_TABLET | Freq: Every evening | ORAL | Status: DC | PRN

## 2023-01-31 MED ORDER — GUAIFENESIN 100 MG/5ML PO LIQD: 5.0000 mL | ORAL | Status: DC | PRN

## 2023-01-31 MED ORDER — IPRATROPIUM-ALBUTEROL 0.5-2.5 (3) MG/3ML IN SOLN: 3.0000 mL | RESPIRATORY_TRACT | Status: DC | PRN

## 2023-01-31 NOTE — Plan of Care (Signed)
  Problem: Education: Goal: Knowledge of General Education information will improve Description: Including pain rating scale, medication(s)/side effects and non-pharmacologic comfort measures Outcome: Progressing   Problem: Clinical Measurements: Goal: Ability to maintain clinical measurements within normal limits will improve Outcome: Progressing Goal: Diagnostic test results will improve Outcome: Progressing Goal: Respiratory complications will improve Outcome: Progressing Goal: Cardiovascular complication will be avoided Outcome: Progressing   Problem: Pain Management: Goal: General experience of comfort will improve Outcome: Progressing   Problem: Pain Management: Goal: General experience of comfort will improve Outcome: Progressing   Problem: Education: Goal: Ability to describe self-care measures that may prevent or decrease complications (Diabetes Survival Skills Education) will improve Outcome: Progressing Goal: Individualized Educational Video(s) Outcome: Progressing   Problem: Skin Integrity: Goal: Risk for impaired skin integrity will decrease Outcome: Progressing   Problem: Coping: Goal: Ability to adjust to condition or change in health will improve Outcome: Progressing   Problem: Fluid Volume: Goal: Ability to maintain a balanced intake and output will improve Outcome: Progressing   Problem: Nutritional: Goal: Maintenance of adequate nutrition will improve Outcome: Progressing Goal: Progress toward achieving an optimal weight will improve Outcome: Progressing   Problem: Metabolic: Goal: Ability to maintain appropriate glucose levels will improve Outcome: Progressing   Problem: Skin Integrity: Goal: Risk for impaired skin integrity will decrease Outcome: Progressing   Problem: Tissue Perfusion: Goal: Adequacy of tissue perfusion will improve Outcome: Progressing

## 2023-01-31 NOTE — Progress Notes (Signed)
PROGRESS NOTE    Curtis Watkins  ZOX:[id number] DOB: September 13, 1955 DOA: 01/30/2023 PCP: Rema Fendt, NP    Brief Narrative:  67 year old with history of left foot wound, diabetic foot ulcer status post amputation of Dr Myrle Sheng 24, DM2, HLD, s/p NSTEMI, history of CHF EF 25% initially presented to [facility] for fistula.  X-ray showed suspicion of osteomyelitis, respiratory panel positive for rhinovirus.  Vancomycin and cefepime and transferred here for further management   Assessment & Plan:  Principal Problem:   Osteomyelitis of foot, left, acute (HCC) Active Problems:   DM2 (diabetes mellitus, type 2) (HCC)   HTN (hypertension)   S/P CABG x 4   HFrEF (heart failure with reduced ejection fraction) (HCC)   Sepsis due to cellulitis (HCC)     Sepsis secondary to recurrent left foot wound/osteomyelitis -Currently on broad-spectrum antibiotic.  There is clinical evidence of osteomyelitis but will obtain MRI.  Dr. Lajoyce Corners has been consulted and is following.  MRI scan Current antibiotics: Daptomycin and cefepime  Coronary artery disease status post CABG Currently chest pain-free.  Continue aspirin, statin   Congestive heart failure with reduced EF, 25% Previously EF has shown 25%.  Continue home medications including aspirin, statin, losartan, Toprol, Aldactone  His previous echo showed EF of 25 to 30%.  He follows with cardiology.  Takes Lasix at home.  Currently Lasix on hold . He looks euvolemic also takes losartan and metoprolol which have been restarted because he is currently hypertensive.Also takes spironolactone.  History of recurrent pleural effusion.   CKD stage IIIb Creatinine at baseline 1.6   Type 2 diabetes A1c 7.6.  Supposed to be on Jardiance and glimepiride.  Currently on sliding scale and Accu-Cheks.    Hyperlipidemia  Continue statin.   Positive rhinovirus Diagnosed at outside hospital.  Supportive care   Smoker Denied nicotine patch    DVT  prophylaxis: heparin injection 5,000 Units Start: 01/30/23 2200 Code Status: Full code Family Communication:   Status is: Inpatient Remains inpatient appropriate because: Ongoing management for possible foot osteomyelitis    Subjective: No new complaints.  Awaiting his MRI Blood pressure initially elevated improved with IV medications.  Examination:  General exam: Appears calm and comfortable  Respiratory system: Clear to auscultation. Respiratory effort normal. Cardiovascular system: S1 & S2 heard, RRR. No JVD, murmurs, rubs, gallops or clicks. No pedal edema. Gastrointestinal system: Abdomen is nondistended, soft and nontender. No organomegaly or masses felt. Normal bowel sounds heard. Central nervous system: Alert and oriented. No focal neurological deficits. Extremities: Symmetric 5 x 5 power. Skin: No rashes, lesions or ulcers, left foot dressing in place Psychiatry: Judgement and insight appear normal. Mood & affect appropriate.                Diet Orders (From admission, onward)     Start     Ordered   01/30/23 1610  Diet Heart Room service appropriate? Yes; Fluid consistency: Thin  Diet effective now       Question Answer Comment  Room service appropriate? Yes   Fluid consistency: Thin      01/30/23 1611            Objective: Vitals:   01/31/23 0930 01/31/23 0932 01/31/23 0940 01/31/23 1005  BP: (!) 211/85 (!) 211/85 (!) 195/79 (!) 174/70  Pulse: 63  (!) 58 62  Resp: 14  18 13   Temp:      TempSrc:      SpO2: 100%  100% 100%  Weight:      Height:        Intake/Output Summary (Last 24 hours) at 01/31/2023 1156 Last data filed at 01/31/2023 0325 Gross per 24 hour  Intake 100.07 ml  Output 1200 ml  Net -1099.93 ml   Filed Weights   01/30/23 1555  Weight: 76 kg    Scheduled Meds:  aspirin  162 mg Oral Daily   atorvastatin  20 mg Oral Daily   dorzolamide-timolol  1 drop Left Eye BID   heparin  5,000 Units Subcutaneous Q8H   insulin  aspart  0-15 Units Subcutaneous TID WC   losartan  12.5 mg Oral Daily   metoprolol succinate  12.5 mg Oral Daily   spironolactone  12.5 mg Oral Daily   Continuous Infusions:  ceFEPime (MAXIPIME) IV 2 g (01/31/23 0606)   DAPTOmycin 700 mg (01/31/23 1044)    Nutritional status     Body mass index is 24.74 kg/m.  Data Reviewed:   CBC: Recent Labs  Lab 01/30/23 1631 01/31/23 0757  WBC 19.6* 14.4*  HGB 12.6* 13.5  HCT 37.6* 40.2  MCV 94.5 94.6  PLT 167 184   Basic Metabolic Panel: Recent Labs  Lab 01/30/23 1631 01/31/23 0757  NA 135 137  K 3.8 4.3  CL 107 107  CO2 19* 21*  GLUCOSE 184* 98  BUN 31* 25*  CREATININE 1.74* 1.60*  CALCIUM 8.2* 8.9   GFR: Estimated Creatinine Clearance: 44.8 mL/min (A) (by C-G formula based on SCr of 1.6 mg/dL (H)). Liver Function Tests: Recent Labs  Lab 01/30/23 1631  AST 16  ALT 13  ALKPHOS 62  BILITOT 0.7  PROT 6.3*  ALBUMIN 2.8*   No results for input(s): "LIPASE", "AMYLASE" in the last 168 hours. No results for input(s): "AMMONIA" in the last 168 hours. Coagulation Profile: No results for input(s): "INR", "PROTIME" in the last 168 hours. Cardiac Enzymes: Recent Labs  Lab 01/31/23 0757  CKTOTAL 91   BNP (last 3 results) No results for input(s): "PROBNP" in the last 8760 hours. HbA1C: Recent Labs    01/30/23 1646  HGBA1C 7.6*   CBG: Recent Labs  Lab 01/30/23 1558 01/30/23 2114 01/31/23 0555  GLUCAP 175* 227* 86   Lipid Profile: No results for input(s): "CHOL", "HDL", "LDLCALC", "TRIG", "CHOLHDL", "LDLDIRECT" in the last 72 hours. Thyroid Function Tests: No results for input(s): "TSH", "T4TOTAL", "FREET4", "T3FREE", "THYROIDAB" in the last 72 hours. Anemia Panel: No results for input(s): "VITAMINB12", "FOLATE", "FERRITIN", "TIBC", "IRON", "RETICCTPCT" in the last 72 hours. Sepsis Labs: No results for input(s): "PROCALCITON", "LATICACIDVEN" in the last 168 hours.  Recent Results (from the past 240  hour(s))  SARS Coronavirus 2 by RT PCR (hospital order, performed in [facility] hospital lab) *cepheid single result test* Anterior Nasal Swab     Status: None   Collection Time: 01/30/23  4:13 PM   Specimen: Anterior Nasal Swab  Result Value Ref Range Status   SARS Coronavirus 2 by RT PCR NEGATIVE NEGATIVE Final    Comment: Performed at [facility], [address] [place], [place] [address]  Respiratory (~20 pathogens) panel by PCR     Status: Abnormal   Collection Time: 01/30/23  4:14 PM   Specimen: Nasopharyngeal Swab; Respiratory  Result Value Ref Range Status   Adenovirus NOT DETECTED NOT DETECTED Final   Coronavirus 229E NOT DETECTED NOT DETECTED Final    Comment: (NOTE) The Coronavirus on the Respiratory Panel, DOES NOT test for the novel  Coronavirus (  2019 nCoV)    Coronavirus HKU1 NOT DETECTED NOT DETECTED Final   Coronavirus NL63 NOT DETECTED NOT DETECTED Final   Coronavirus OC43 NOT DETECTED NOT DETECTED Final   Metapneumovirus NOT DETECTED NOT DETECTED Final   Rhinovirus / Enterovirus DETECTED (A) NOT DETECTED Final   Influenza A NOT DETECTED NOT DETECTED Final   Influenza B NOT DETECTED NOT DETECTED Final   Parainfluenza Virus 1 NOT DETECTED NOT DETECTED Final   Parainfluenza Virus 2 NOT DETECTED NOT DETECTED Final   Parainfluenza Virus 3 NOT DETECTED NOT DETECTED Final   Parainfluenza Virus 4 NOT DETECTED NOT DETECTED Final   Respiratory Syncytial Virus NOT DETECTED NOT DETECTED Final   Bordetella pertussis NOT DETECTED NOT DETECTED Final   Bordetella Parapertussis NOT DETECTED NOT DETECTED Final   Chlamydophila pneumoniae NOT DETECTED NOT DETECTED Final   Mycoplasma pneumoniae NOT DETECTED NOT DETECTED Final    Comment: Performed at [facility], [address] [place], [place] [address]  Culture, blood (Routine X 2) w Reflex to ID Panel     Status: None (Preliminary result)   Collection Time: 01/30/23  4:44 PM   Specimen: BLOOD RIGHT ARM  Result  Value Ref Range Status   Specimen Description BLOOD RIGHT ARM  Final   Special Requests   Final    BOTTLES DRAWN AEROBIC AND ANAEROBIC Blood Culture results may not be optimal due to an inadequate volume of blood received in culture bottles   Culture   Final    NO GROWTH < 24 HOURS Performed at [facility], [address], [place], [place] [address]    Report Status PENDING  Incomplete  Culture, blood (Routine X 2) w Reflex to ID Panel     Status: None (Preliminary result)   Collection Time: 01/30/23  4:46 PM   Specimen: BLOOD RIGHT ARM  Result Value Ref Range Status   Specimen Description BLOOD RIGHT ARM  Final   Special Requests   Final    BOTTLES DRAWN AEROBIC AND ANAEROBIC Blood Culture results may not be optimal due to an inadequate volume of blood received in culture bottles   Culture   Final    NO GROWTH < 24 HOURS Performed at [facility], [address], [place], [place] [address]    Report Status PENDING  Incomplete         Radiology Studies: No results found.         LOS: 1 day   Time spent= 35 mins    Miguel Rota, MD [facility]  If 7PM-7AM, please contact night-coverage  01/31/2023, 11:56 AM

## 2023-01-31 NOTE — Progress Notes (Signed)
   01/31/23 0930  Assess: MEWS Score  BP (!) 211/85  MAP (mmHg) 121  Pulse Rate 63  ECG Heart Rate 62  Resp 14  SpO2 100 %  Assess: MEWS Score  MEWS Temp 0  MEWS Systolic 2  MEWS Pulse 0  MEWS RR 0  MEWS LOC 0  MEWS Score 2  MEWS Score Color Yellow  Assess: if the MEWS score is Yellow or Red  Were vital signs accurate and taken at a resting state? Yes  Does the patient meet 2 or more of the SIRS criteria? No  MEWS guidelines implemented  Yes, yellow  Treat  MEWS Interventions Considered administering scheduled or prn medications/treatments as ordered  Take Vital Signs  Increase Vital Sign Frequency  Yellow: Q2hr x1, continue Q4hrs until patient remains green for 12hrs  Escalate  MEWS: Escalate Yellow: Discuss with charge nurse and consider notifying provider and/or RRT  Notify: Charge Nurse/RN  Name of Charge Nurse/RN Notified Henriette Combs  Provider Notification  Provider Name/Title Dr. Nelson Chimes  Date Provider Notified 01/31/23  Time Provider Notified [phone]  Method of Notification Face-to-face  Notification Reason Change in status  Provider response At bedside  Date of Provider Response 01/31/23  Time of Provider Response 0934  Assess: SIRS CRITERIA  SIRS Temperature  0  SIRS Pulse 0  SIRS Respirations  0  SIRS WBC 1  SIRS Score Sum  1

## 2023-01-31 NOTE — Hospital Course (Addendum)
Brief Narrative:  67 year old with history of left foot wound, diabetic foot ulcer status post amputation of Dr Myrle Sheng 24, DM2, HLD, s/p NSTEMI, history of CHF EF 25% initially presented to [facility] for fistula.  X-ray showed suspicion of osteomyelitis, respiratory panel positive for rhinovirus.  Vancomycin and cefepime and transferred here for further management.  MRI positive for osteomyelitis, Dr. Lajoyce Corners planning on surgery Wednesday   Assessment & Plan:  Principal Problem:   Osteomyelitis of foot, left, acute (HCC) Active Problems:   DM2 (diabetes mellitus, type 2) (HCC)   HTN (hypertension)   S/P CABG x 4   HFrEF (heart failure with reduced ejection fraction) (HCC)   Sepsis due to cellulitis (HCC)     Sepsis secondary to recurrent left foot wound/osteomyelitis - MRI of the left foot is consistent with osteomyelitis.  Dr. Lajoyce Corners planning on surgery on Wednesday. Current antibiotics: Daptomycin and cefepime  Coronary artery disease status post CABG Currently chest pain-free.  Continue aspirin, statin   Congestive heart failure with reduced EF, 25% Uncontrolled HTN Previously EF has shown 25%.  Continue home medications including aspirin, statin, Toprol. Increase Losartan and Aldactone today   CKD stage IIIb Creatinine at baseline 1.6   Type 2 diabetes A1c 7.6.  Supposed to be on Jardiance and glimepiride.  Currently on sliding scale and Accu-Cheks.    Hyperlipidemia  Continue statin.   Positive rhinovirus Diagnosed at outside hospital.  Supportive care   Smoker Denied nicotine patch    DVT prophylaxis: heparin injection 5,000 Units Start: 01/30/23 2200 Code Status: Full code Family Communication:   Status is: Inpatient Remains inpatient appropriate because: Surgery on Wednesday, likely discharge on Thursday    Subjective: Feeling okay no complaints otherwise  Examination:  General exam: Appears calm and comfortable  Respiratory system: Clear to  auscultation. Respiratory effort normal. Cardiovascular system: S1 & S2 heard, RRR. No JVD, murmurs, rubs, gallops or clicks. No pedal edema. Gastrointestinal system: Abdomen is nondistended, soft and nontender. No organomegaly or masses felt. Normal bowel sounds heard. Central nervous system: Alert and oriented. No focal neurological deficits. Extremities: Symmetric 5 x 5 power. Skin: No rashes, lesions or ulcers, left foot dressing in place Psychiatry: Judgement and insight appear normal. Mood & affect appropriate.

## 2023-01-31 NOTE — Consult Note (Signed)
ORTHOPAEDIC CONSULTATION  REQUESTING PHYSICIAN: Miguel Rota, MD  Chief Complaint: Clear drainage from the left foot.  HPI: TRIG PARADY is a 67 y.o. male who presents with fever and chills.  By report the radiograph of the left foot showed some bony changes of the medial column.  Patient denies any symptoms at this time.  Past Medical History:  Diagnosis Date   Blind right eye    Carotid artery disease (HCC)    04/01/20: 60-79% RICA stenosis, 1-39% LICA stenosis   Chronic kidney disease    ckd stage 3 a per dr Ronald Lobo 02-05-2021 epic   Coronary artery disease    NSTEMI s/p CABG in 03/2020 (LIMA-->LAD/diag, L radial-->OM1/OM3); b.   DM type 2    Glaucoma of right eye, unspecified glaucoma type    HFrEF (heart failure with reduced ejection fraction) (HCC)    Echo 6/22: EF 45, global HK, mild MR, RVSP 31.9   Hypertension    Ischemic cardiomyopathy    PAD (peripheral artery disease) (HCC)    occluded right axillary artery 03/29/20    Past Surgical History:  Procedure Laterality Date   AMPUTATION Right 04/24/2020   Procedure: RIGHT 2ND AND 3RD TOE AMPUTATION;  Surgeon: Nadara Mustard, MD;  Location: [facility] OR;  Service: Orthopedics;  Laterality: Right;   AMPUTATION Right 10/22/2021   Procedure: RIGHT TRANSMETATARSAL AMPUTATION;  Surgeon: Nadara Mustard, MD;  Location[facility] OR;  Service: Orthopedics;  Laterality: Right;   AMPUTATION Left 09/04/2022   Procedure: LEFT 1ST RAY AMPUTATION;  Surgeon: Nadara Mustard, MD;  Location: [facility] OR;  Service: Orthopedics;  Laterality: Left;   APPLICATION OF WOUND VAC Right 10/22/2021   Procedure: APPLICATION OF WOUND VAC;  Surgeon: Nadara Mustard, MD;  Location: [facility] OR;  Service: Orthopedics;  Laterality: Right;   CENTRAL VENOUS CATHETER INSERTION Left 04/03/2020   Procedure: INSERTION OF FEMORAL ARTERIAL LINE;  Surgeon: Linden Dolin, MD;  Location: [facility] OR;  Service: Open Heart Surgery;  Laterality: Left;   CORONARY ARTERY BYPASS GRAFT N/A  04/03/2020   Procedure: CORONARY ARTERY BYPASS GRAFTING (CABG) TIMES FOUR USING LEFT INTERNAL MAMMARY ARTERY AND LEFT RADIAL ARTERY.;  Surgeon: Linden Dolin, MD;  Location: [facility] OR;  Service: Open Heart Surgery;  Laterality: N/A;   CORONARY PRESSURE/FFR WITH 3D MAPPING N/A 03/29/2020   Procedure: Coronary Pressure Wire/FFR w/3D Mapping;  Surgeon: Lyn Records, MD;  Location: [facility] INVASIVE CV LAB;  Service: Cardiovascular;  Laterality: N/A;   EYE SURGERY Right 01/21/2021   right eye cataract lens replacement surgery   I & D EXTREMITY Left 07/01/2022   Procedure: LEFT FOOT DEBRIDEMENT AND FOREIGN BODY REMOVAL;  Surgeon: Nadara Mustard, MD;  Location: [facility] OR;  Service: Orthopedics;  Laterality: Left;   IR THORACENTESIS ASP PLEURAL SPACE W/IMG GUIDE  05/03/2020   IR THORACENTESIS ASP PLEURAL SPACE W/IMG GUIDE  05/16/2020   LEFT HEART CATH AND CORONARY ANGIOGRAPHY N/A 03/29/2020   Procedure: LEFT HEART CATH AND CORONARY ANGIOGRAPHY;  Surgeon: Lyn Records, MD;  Location: [facility] INVASIVE CV LAB;  Service: Cardiovascular;  Laterality: N/A;   PENILE PROSTHESIS IMPLANT N/A 03/03/2021   Procedure: PENILE PROTHESIS INFLATABLE COLOPLAST;  Surgeon: Crista Elliot, MD;  Location: [facility];  Service: Urology;  Laterality: N/A;  REQUESTING 2 HRS   RADIAL ARTERY HARVEST Left 04/03/2020   Procedure: LEFT RADIAL ARTERY HARVEST;  Surgeon: Linden Dolin, MD;  Location: [facility] OR;  Service: Open  Heart Surgery;  Laterality: Left;   TEE WITHOUT CARDIOVERSION N/A 04/03/2020   Procedure: TRANSESOPHAGEAL ECHOCARDIOGRAM (TEE);  Surgeon: Linden Dolin, MD;  Location: [facility] OR;  Service: Open Heart Surgery;  Laterality: N/A;   UPPER EXTREMITY ANGIOGRAPHY  03/29/2020   Procedure: Upper Extremity Angiography;  Surgeon: Lyn Records, MD;  Location: [facility] INVASIVE CV LAB;  Service: Cardiovascular;;  rt.  arm   Social History   Socioeconomic History   Marital status: Significant Other    Spouse name: Not on  file   Number of children: 3   Years of education: Not on file   Highest education level: High school graduate  Occupational History   Occupation: retired d/t medical conditions  Tobacco Use   Smoking status: Every Day    Current packs/day: 1.00    Average packs/day: 1 pack/day for 0.5 years (0.5 ttl pk-yrs)    Types: Cigarettes    Passive exposure: Current   Smokeless tobacco: Never  Vaping Use   Vaping status: Never Used  Substance and Sexual Activity   Alcohol use: Not Currently    Alcohol/week: 1.0 standard drink of alcohol    Types: 1 Cans of beer per week    Comment: occassionally beer   Drug use: Not Currently    Types: Marijuana    Comment: marijuana last use 3 xweek last used 02-23-2021   Sexual activity: Not Currently  Other Topics Concern   Not on file  Social History Narrative   Not on file   Social Determinants of Health   Financial Resource Strain: High Risk (05/28/2020)   Overall Financial Resource Strain (CARDIA)    Difficulty of Paying Living Expenses: Hard  Food Insecurity: No Food Insecurity (01/30/2023)   Hunger Vital Sign    Worried About Running Out of Food in the Last Year: Never true    Ran Out of Food in the Last Year: Never true  Transportation Needs: No Transportation Needs (04/20/2022)   PRAPARE - Administrator, Civil Service (Medical): No    Lack of Transportation (Non-Medical): No  Physical Activity: Inactive (05/28/2020)   Exercise Vital Sign    Days of Exercise per Week: 0 days    Minutes of Exercise per Session: 0 min  Stress: Not on file  Social Connections: Unknown (02/10/2022)   [facility]   Social Network    Social Network: Not on file   Family History  Problem Relation Age of Onset   Diabetes Mother    - negative except otherwise stated in the family history section Allergies  Allergen Reactions   Vancomycin      01/30/2023 had discrete hives, only in the arm where he was getting the  infusion. Had brisk response to Benadryl. We then followed with a reduced rate of vancomycin, and he tolerated the entire dose with no further rash.      Prior to Admission medications   Medication Sig Start Date End Date Taking? Authorizing Provider  aspirin 325 MG tablet Take 162 mg by mouth in the morning. 04/07/20  Yes [provider]  atorvastatin (LIPITOR) 80 MG tablet Take 1 tablet (80 mg total) by mouth daily. Pt needs to schedule appt with provider for further refills 01/26/23  Yes Monge, Petra Kuba, NP  brimonidine (ALPHAGAN) 0.2 % ophthalmic solution Place 1 drop into the left eye 2 (two) times daily. 12/18/22  Yes   dorzolamide-timolol (COSOPT) 2-0.5 % ophthalmic solution Place 1 drop into the  left eye 2 (two) times daily. 12/18/22  Yes   empagliflozin (JARDIANCE) 25 MG TABS tablet Take 1 tablet (25 mg total) by mouth daily before breakfast. 07/28/22  Yes Shamleffer, Konrad Dolores, MD  furosemide (LASIX) 20 MG tablet Take 1 tablet (20 mg total) by mouth 3 (three) times a week. Take on Mondays, Wednesdays, and Fridays. 05/18/22  Yes Meriam Sprague, MD  glimepiride (AMARYL) 1 MG tablet Take 1 tablet (1 mg total) by mouth daily before breakfast. 07/28/22  Yes Shamleffer, Konrad Dolores, MD  losartan (COZAAR) 25 MG tablet Take 1/2 tablet (12.5 mg total) by mouth daily. 03/30/22  Yes Meriam Sprague, MD  metoprolol succinate (TOPROL XL) 25 MG 24 hr tablet Take 1/2 tablet (12.5 mg total) by mouth daily. 01/12/23  Yes Weaver, Scott T, PA-C  spironolactone (ALDACTONE) 25 MG tablet Take 1/2 tablet (12.5 mg total) by mouth daily. 01/20/23  Yes Gaston Islam., NP  blood glucose meter kit and supplies by Other route as directed. Dispense based on patient and insurance preference. Use up to 2x times daily as directed. (FOR ICD-10 E10.9, E11.9).    [provider]  erythromycin ophthalmic ointment Apply 1/2 inch strip to the right eye 4 (four) times daily as needed for eye  discomfort Patient not taking: Reported on 01/30/2023 12/18/22     oxyCODONE-acetaminophen (PERCOCET/ROXICET) 5-325 MG tablet Take 1 tablet by mouth every 6 (six) hours as needed. Patient not taking: Reported on 01/30/2023 09/12/22   Adonis Huguenin, NP   No results found. - pertinent xrays, CT, MRI studies were reviewed and independently interpreted  Positive ROS: All other systems have been reviewed and were otherwise negative with the exception of those mentioned in the HPI and as above.  Physical Exam: General: Alert, no acute distress Psychiatric: Patient is competent for consent with normal mood and affect Lymphatic: No axillary or cervical lymphadenopathy Cardiovascular: No pedal edema Respiratory: No cyanosis, no use of accessory musculature GI: No organomegaly, abdomen is soft and non-tender    Images:  @ENCIMAGES @  Labs:  Lab Results  Component Value Date   HGBA1C 7.6 (H) 01/30/2023   HGBA1C 7.6 (A) 07/28/2022   HGBA1C 6.9 (A) 11/28/2021   REPTSTATUS 07/06/2022 FINAL 07/01/2022   GRAMSTAIN  07/01/2022    RARE WBC PRESENT,BOTH PMN AND MONONUCLEAR FEW GRAM POSITIVE COCCI IN PAIRS IN CLUSTERS    CULT  07/01/2022    ABUNDANT STAPHYLOCOCCUS EPIDERMIDIS NO ANAEROBES ISOLATED Performed at [facility], [address], [place], [place] [address]    [facility] STAPHYLOCOCCUS EPIDERMIDIS 07/01/2022    Lab Results  Component Value Date   ALBUMIN 2.8 (L) 01/30/2023   ALBUMIN 4.0 08/02/2020        Latest Ref Rng & Units 01/31/2023    7:57 AM 01/30/2023    4:31 PM 09/04/2022    6:43 AM  CBC EXTENDED  WBC 4.0 - 10.5 K/uL 14.4  19.6  8.3   RBC 4.22 - 5.81 MIL/uL 4.25  3.98  3.49   Hemoglobin 13.0 - 17.0 g/dL 60.4  54.0  98.1   HCT 39.0 - 52.0 % 40.2  37.6  32.7   Platelets 150 - 400 K/uL 184  167  271     Neurologic: Patient does not have protective sensation bilateral lower extremities.   MUSCULOSKELETAL:   Skin: Examination there is no fluctuance no  cellulitis of the left foot.  There is no tenderness to palpation.  Patient has a strong palpable  dorsalis pedis pulse.  I can express a small amount of clear serosanguineous fluid no purulent drainage.  White cell count 14.4.  Hemoglobin A1c 7.6.  Previous wound cultures in May were positive for staph epi that was resistant to most antibiotics but sensitive to Cipro and vancomycin.  MRI scan pending.  Respiratory panel positive for rhinovirus. Assessment: Assessment: Swelling and clear serosanguineous drainage left foot, rule out bone infection.  Plan: Plan: MRI scan is pending.  Surgical intervention versus discharge on oral antibiotics pending the results of the MRI scan.  Thank you for the consult and the opportunity to see Mr. Curtis Fugett, MD [facility] [phone] 8:29 AM

## 2023-02-01 DIAGNOSIS — M86172 Other acute osteomyelitis, left ankle and foot: Secondary | ICD-10-CM | POA: Diagnosis not present

## 2023-02-01 LAB — BASIC METABOLIC PANEL
Anion gap: 9 (ref 5–15)
BUN: 23 mg/dL (ref 8–23)
CO2: 20 mmol/L — ABNORMAL LOW (ref 22–32)
Calcium: 8.4 mg/dL — ABNORMAL LOW (ref 8.9–10.3)
Chloride: 108 mmol/L (ref 98–111)
Creatinine, Ser: 1.22 mg/dL (ref 0.61–1.24)
GFR, Estimated: >60 mL/min (ref >60)
Glucose, Bld: 107 mg/dL — ABNORMAL HIGH (ref 70–99)
Potassium: 3.7 mmol/L (ref 3.5–5.1)
Sodium: 137 mmol/L (ref 135–145)

## 2023-02-01 LAB — CBC
HCT: 36.9 % — ABNORMAL LOW (ref 39.0–52.0)
Hemoglobin: 12.4 g/dL — ABNORMAL LOW (ref 13.0–17.0)
MCH: 31.2 pg (ref 26.0–34.0)
MCHC: 33.6 g/dL (ref 30.0–36.0)
MCV: 92.7 fL (ref 80.0–100.0)
Platelets: 176 10*3/uL (ref 150–400)
RBC: 3.98 MIL/uL — ABNORMAL LOW (ref 4.22–5.81)
RDW: 13.6 % (ref 11.5–15.5)
WBC: 10.2 10*3/uL (ref 4.0–10.5)
nRBC: 0 % (ref 0.0–0.2)

## 2023-02-01 LAB — GLUCOSE, CAPILLARY
Glucose-Capillary: 99 mg/dL (ref 70–99)
Glucose-Capillary: 150 mg/dL — ABNORMAL HIGH (ref 70–99)
Glucose-Capillary: 119 mg/dL — ABNORMAL HIGH (ref 70–99)
Glucose-Capillary: 149 mg/dL — ABNORMAL HIGH (ref 70–99)

## 2023-02-01 LAB — MAGNESIUM: Magnesium: 2.1 mg/dL (ref 1.7–2.4)

## 2023-02-01 LAB — PHOSPHORUS: Phosphorus: 2.7 mg/dL (ref 2.5–4.6)

## 2023-02-01 NOTE — Plan of Care (Signed)
  Problem: Education: Goal: Knowledge of General Education information will improve Description: Including pain rating scale, medication(s)/side effects and non-pharmacologic comfort measures Outcome: Progressing   Problem: Clinical Measurements: Goal: Ability to maintain clinical measurements within normal limits will improve Outcome: Progressing Goal: Diagnostic test results will improve Outcome: Progressing Goal: Respiratory complications will improve Outcome: Progressing Goal: Cardiovascular complication will be avoided Outcome: Progressing   Problem: Pain Management: Goal: General experience of comfort will improve Outcome: Progressing   Problem: Skin Integrity: Goal: Risk for impaired skin integrity will decrease Outcome: Progressing   Problem: Safety: Goal: Ability to remain free from injury will improve Outcome: Progressing   Problem: Education: Goal: Ability to describe self-care measures that may prevent or decrease complications (Diabetes Survival Skills Education) will improve Outcome: Progressing Goal: Individualized Educational Video(s) Outcome: Progressing   Problem: Coping: Goal: Ability to adjust to condition or change in health will improve Outcome: Progressing   Problem: Fluid Volume: Goal: Ability to maintain a balanced intake and output will improve Outcome: Progressing   Problem: Health Behavior/Discharge Planning: Goal: Ability to identify and utilize available resources and services will improve Outcome: Progressing Goal: Ability to manage health-related needs will improve Outcome: Progressing   Problem: Metabolic: Goal: Ability to maintain appropriate glucose levels will improve Outcome: Progressing   Problem: Nutritional: Goal: Maintenance of adequate nutrition will improve Outcome: Progressing Goal: Progress toward achieving an optimal weight will improve Outcome: Progressing   Problem: Skin Integrity: Goal: Risk for impaired skin  integrity will decrease Outcome: Progressing   Problem: Tissue Perfusion: Goal: Adequacy of tissue perfusion will improve Outcome: Progressing

## 2023-02-01 NOTE — Progress Notes (Signed)
Patient ID: Curtis Watkins, male   DOB: [birth year], 67 y.o.   MRN: [id number] Review of the MRI scan shows osteomyelitis of the medial cuneiform with a abscess extending from the medial cuneiform.  There is some early edema at the base of the second metatarsal and middle cuneiform.  Discussed with the patient recommendation to proceed with removal of the medial cuneiform placement of vancomycin powder and Kerecis micro graft.  Discussed the patient can discharge the day after surgery.  Patient agrees to proceeding with surgery on Wednesday.

## 2023-02-01 NOTE — H&P (View-Only) (Signed)
Patient ID: Curtis Watkins, male   DOB: [birth year], 67 y.o.   MRN: [id number] Review of the MRI scan shows osteomyelitis of the medial cuneiform with a abscess extending from the medial cuneiform.  There is some early edema at the base of the second metatarsal and middle cuneiform.  Discussed with the patient recommendation to proceed with removal of the medial cuneiform placement of vancomycin powder and Kerecis micro graft.  Discussed the patient can discharge the day after surgery.  Patient agrees to proceeding with surgery on Wednesday.

## 2023-02-01 NOTE — Plan of Care (Signed)
  Problem: Pain Management: Goal: General experience of comfort will improve Outcome: Progressing   Problem: Metabolic: Goal: Ability to maintain appropriate glucose levels will improve Outcome: Progressing   Problem: Nutritional: Goal: Maintenance of adequate nutrition will improve Outcome: Progressing   Problem: Tissue Perfusion: Goal: Adequacy of tissue perfusion will improve Outcome: Progressing

## 2023-02-01 NOTE — Progress Notes (Signed)
PROGRESS NOTE    Curtis Watkins  UXL:[id number] DOB: 1956/01/26 DOA: 01/30/2023 PCP: Rema Fendt, NP    Brief Narrative:  67 year old with history of left foot wound, diabetic foot ulcer status post amputation of Dr Myrle Sheng 24, DM2, HLD, s/p NSTEMI, history of CHF EF 25% initially presented to [facility] for fistula.  X-ray showed suspicion of osteomyelitis, respiratory panel positive for rhinovirus.  Vancomycin and cefepime and transferred here for further management.  MRI positive for osteomyelitis, Dr. Lajoyce Corners planning on surgery Wednesday   Assessment & Plan:  Principal Problem:   Osteomyelitis of foot, left, acute (HCC) Active Problems:   DM2 (diabetes mellitus, type 2) (HCC)   HTN (hypertension)   S/P CABG x 4   HFrEF (heart failure with reduced ejection fraction) (HCC)   Sepsis due to cellulitis (HCC)     Sepsis secondary to recurrent left foot wound/osteomyelitis - MRI of the left foot is consistent with osteomyelitis.  Dr. Lajoyce Corners planning on surgery on Wednesday. Current antibiotics: Daptomycin and cefepime  Coronary artery disease status post CABG Currently chest pain-free.  Continue aspirin, statin   Congestive heart failure with reduced EF, 25% Previously EF has shown 25%.  Continue home medications including aspirin, statin, losartan, Toprol, Aldactone   CKD stage IIIb Creatinine at baseline 1.6   Type 2 diabetes A1c 7.6.  Supposed to be on Jardiance and glimepiride.  Currently on sliding scale and Accu-Cheks.    Hyperlipidemia  Continue statin.   Positive rhinovirus Diagnosed at outside hospital.  Supportive care   Smoker Denied nicotine patch    DVT prophylaxis: heparin injection 5,000 Units Start: 01/30/23 2200 Code Status: Full code Family Communication:   Status is: Inpatient Remains inpatient appropriate because: Surgery on Wednesday, likely discharge on Thursday    Subjective: No complaints feeling okay  Examination:  General exam:  Appears calm and comfortable  Respiratory system: Clear to auscultation. Respiratory effort normal. Cardiovascular system: S1 & S2 heard, RRR. No JVD, murmurs, rubs, gallops or clicks. No pedal edema. Gastrointestinal system: Abdomen is nondistended, soft and nontender. No organomegaly or masses felt. Normal bowel sounds heard. Central nervous system: Alert and oriented. No focal neurological deficits. Extremities: Symmetric 5 x 5 power. Skin: No rashes, lesions or ulcers, left foot dressing in place Psychiatry: Judgement and insight appear normal. Mood & affect appropriate.                Diet Orders (From admission, onward)     Start     Ordered   01/30/23 1610  Diet Heart Room service appropriate? Yes; Fluid consistency: Thin  Diet effective now       Question Answer Comment  Room service appropriate? Yes   Fluid consistency: Thin      01/30/23 1611            Objective: Vitals:   01/31/23 2350 02/01/23 0411 02/01/23 0751 02/01/23 1100  BP: (!) 176/67 (!) 174/82 (!) 202/83 (!) 188/81  Pulse:  77 66 65  Resp:   14 19  Temp: 98.8 F (37.1 C) 98 F (36.7 C) 98.1 F (36.7 C) 98.1 F (36.7 C)  TempSrc: Oral Oral Oral Oral  SpO2:  100% 99% 100%  Weight:      Height:        Intake/Output Summary (Last 24 hours) at 02/01/2023 1121 Last data filed at 02/01/2023 0417 Gross per 24 hour  Intake 200 ml  Output 1000 ml  Net -800 ml   [employer]  01/30/23 1555  Weight: 76 kg    Scheduled Meds:  aspirin  162 mg Oral Daily   atorvastatin  20 mg Oral Daily   dorzolamide-timolol  1 drop Left Eye BID   heparin  5,000 Units Subcutaneous Q8H   insulin aspart  0-15 Units Subcutaneous TID WC   losartan  12.5 mg Oral Daily   metoprolol succinate  12.5 mg Oral Daily   spironolactone  12.5 mg Oral Daily   Continuous Infusions:  ceFEPime (MAXIPIME) IV 2 g (02/01/23 0612)   DAPTOmycin Stopped (01/31/23 1114)    Nutritional status     Body mass index is  24.74 kg/m.  Data Reviewed:   CBC: Recent Labs  Lab 01/30/23 1631 01/31/23 0757 02/01/23 0305  WBC 19.6* 14.4* 10.2  HGB 12.6* 13.5 12.4*  HCT 37.6* 40.2 36.9*  MCV 94.5 94.6 92.7  PLT 167 184 176   Basic Metabolic Panel: Recent Labs  Lab 01/30/23 1631 01/31/23 0757 02/01/23 0305  NA 135 137 137  K 3.8 4.3 3.7  CL 107 107 108  CO2 19* 21* 20*  GLUCOSE 184* 98 107*  BUN 31* 25* 23  CREATININE 1.74* 1.60* 1.22  CALCIUM 8.2* 8.9 8.4*  MG  --   --  2.1  PHOS  --   --  2.7   GFR: Estimated Creatinine Clearance: 58.8 mL/min (by C-G formula based on SCr of 1.22 mg/dL). Liver Function Tests: Recent Labs  Lab 01/30/23 1631  AST 16  ALT 13  ALKPHOS 62  BILITOT 0.7  PROT 6.3*  ALBUMIN 2.8*   No results for input(s): "LIPASE", "AMYLASE" in the last 168 hours. No results for input(s): "AMMONIA" in the last 168 hours. Coagulation Profile: No results for input(s): "INR", "PROTIME" in the last 168 hours. Cardiac Enzymes: Recent Labs  Lab 01/31/23 0757  CKTOTAL 91   BNP (last 3 results) No results for input(s): "PROBNP" in the last 8760 hours. HbA1C: Recent Labs    01/30/23 1646  HGBA1C 7.6*   CBG: Recent Labs  Lab 01/31/23 0555 01/31/23 1235 01/31/23 1615 01/31/23 2145 02/01/23 0610  GLUCAP 86 186* 127* 191* 99   Lipid Profile: No results for input(s): "CHOL", "HDL", "LDLCALC", "TRIG", "CHOLHDL", "LDLDIRECT" in the last 72 hours. Thyroid Function Tests: No results for input(s): "TSH", "T4TOTAL", "FREET4", "T3FREE", "THYROIDAB" in the last 72 hours. Anemia Panel: No results for input(s): "VITAMINB12", "FOLATE", "FERRITIN", "TIBC", "IRON", "RETICCTPCT" in the last 72 hours. Sepsis Labs: No results for input(s): "PROCALCITON", "LATICACIDVEN" in the last 168 hours.  Recent Results (from the past 240 hour(s))  SARS Coronavirus 2 by RT PCR (hospital order, performed in [facility] hospital lab) *cepheid single result test* Anterior Nasal Swab     Status:  None   Collection Time: 01/30/23  4:13 PM   Specimen: Anterior Nasal Swab  Result Value Ref Range Status   SARS Coronavirus 2 by RT PCR NEGATIVE NEGATIVE Final    Comment: Performed at [facility], [address], [place], [place] [address]  Respiratory (~20 pathogens) panel by PCR     Status: Abnormal   Collection Time: 01/30/23  4:14 PM   Specimen: Nasopharyngeal Swab; Respiratory  Result Value Ref Range Status   Adenovirus NOT DETECTED NOT DETECTED Final   Coronavirus 229E NOT DETECTED NOT DETECTED Final    Comment: (NOTE) The Coronavirus on the Respiratory Panel, DOES NOT test for the novel  Coronavirus (2019 nCoV)    Coronavirus HKU1 NOT DETECTED NOT DETECTED Final  Coronavirus NL63 NOT DETECTED NOT DETECTED Final   Coronavirus OC43 NOT DETECTED NOT DETECTED Final   Metapneumovirus NOT DETECTED NOT DETECTED Final   Rhinovirus / Enterovirus DETECTED (A) NOT DETECTED Final   Influenza A NOT DETECTED NOT DETECTED Final   Influenza B NOT DETECTED NOT DETECTED Final   Parainfluenza Virus 1 NOT DETECTED NOT DETECTED Final   Parainfluenza Virus 2 NOT DETECTED NOT DETECTED Final   Parainfluenza Virus 3 NOT DETECTED NOT DETECTED Final   Parainfluenza Virus 4 NOT DETECTED NOT DETECTED Final   Respiratory Syncytial Virus NOT DETECTED NOT DETECTED Final   Bordetella pertussis NOT DETECTED NOT DETECTED Final   Bordetella Parapertussis NOT DETECTED NOT DETECTED Final   Chlamydophila pneumoniae NOT DETECTED NOT DETECTED Final   Mycoplasma pneumoniae NOT DETECTED NOT DETECTED Final    Comment: Performed at [facility], [address], [place], [place] [address]  Culture, blood (Routine X 2) w Reflex to ID Panel     Status: None (Preliminary result)   Collection Time: 01/30/23  4:44 PM   Specimen: BLOOD RIGHT ARM  Result Value Ref Range Status   Specimen Description BLOOD RIGHT ARM  Final   Special Requests   Final    BOTTLES DRAWN AEROBIC AND ANAEROBIC Blood Culture results  may not be optimal due to an inadequate volume of blood received in culture bottles   Culture   Final    NO GROWTH 2 DAYS Performed at [facility], [address] [place], [place] [address]    Report Status PENDING  Incomplete  Culture, blood (Routine X 2) w Reflex to ID Panel     Status: None (Preliminary result)   Collection Time: 01/30/23  4:46 PM   Specimen: BLOOD RIGHT ARM  Result Value Ref Range Status   Specimen Description BLOOD RIGHT ARM  Final   Special Requests   Final    BOTTLES DRAWN AEROBIC AND ANAEROBIC Blood Culture results may not be optimal due to an inadequate volume of blood received in culture bottles   Culture   Final    NO GROWTH 2 DAYS Performed at [facility], [address], [place], [place] [address]    Report Status PENDING  Incomplete         Radiology Studies: MR FOOT LEFT WO CONTRAST  Result Date: 02/01/2023 CLINICAL DATA:  Osteomyelitis of the foot EXAM: MRI OF THE LEFT FOOT WITHOUT CONTRAST TECHNIQUE: Multiplanar, multisequence MR imaging of the left forefoot was performed. No intravenous contrast was administered. COMPARISON:  Radiographs 06/23/2022 FINDINGS: Bones/Joint/Cartilage Interval amputation the first digit including the first metatarsal and first digit phalanges. Diffuse abnormal edema in the medial cuneiform with a anterior erosion connecting to a tubular fluid collection in the soft tissues distal to the medial cuneiform. Subtle edema in the marrow of the middle cuneiform and in the base of the second metatarsal. Ligaments Lisfranc ligament intact. Muscles and Tendons Subtle diffuse muscular edema, possibly neurogenic. 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 Soft tissues 4.2 by 1.3 by 1.6 cm tubular abscess looping in the soft tissues distal to the medial cuneiform and abutting the distal margin of the medial cuneiform. Adjacent cutaneous and subcutaneous edema favoring local cellulitis.  IMPRESSION: 1. Osteomyelitis of the medial cuneiform with a tubular abscess looping in the soft tissues distal to the medial cuneiform and abutting the distal margin of the medial cuneiform. 2. Subtle edema in the marrow of the middle cuneiform and  base of the second metatarsal, equivocal for early osteomyelitis. 3. Interval amputation of the first digit including the first metatarsal and first digit phalanges. 4. Subtle diffuse muscular edema, possibly neurogenic. Electronically Signed   By: Gaylyn Rong M.D.   On: 02/01/2023 07:13           LOS: 2 days   Time spent= 35 mins    Miguel Rota, MD [facility]  If 7PM-7AM, please contact night-coverage  02/01/2023, 11:21 AM

## 2023-02-01 NOTE — Progress Notes (Signed)
Patient called to front desk to state that his IV was "leaking."  Nurse staff assessed and found that patient's IV had been removed.  This nurse attempted new IV placement twice in left hand and left forearm.  Patient's veins are small and have a tendency to "roll".  IV team consult placed.

## 2023-02-02 ENCOUNTER — Ambulatory Visit: Payer: 59 | Admitting: Orthopedic Surgery

## 2023-02-02 DIAGNOSIS — M86172 Other acute osteomyelitis, left ankle and foot: Secondary | ICD-10-CM | POA: Diagnosis not present

## 2023-02-02 LAB — SURGICAL PCR SCREEN
MRSA, PCR: NEGATIVE
Staphylococcus aureus: NEGATIVE

## 2023-02-02 LAB — CBC
HCT: 39.5 % (ref 39.0–52.0)
Hemoglobin: 13.4 g/dL (ref 13.0–17.0)
MCH: 31.2 pg (ref 26.0–34.0)
MCHC: 33.9 g/dL (ref 30.0–36.0)
MCV: 92.1 fL (ref 80.0–100.0)
Platelets: 180 10*3/uL (ref 150–400)
RBC: 4.29 MIL/uL (ref 4.22–5.81)
RDW: 13.5 % (ref 11.5–15.5)
WBC: 7.3 10*3/uL (ref 4.0–10.5)
nRBC: 0 % (ref 0.0–0.2)

## 2023-02-02 LAB — BASIC METABOLIC PANEL
Anion gap: 7 (ref 5–15)
BUN: 23 mg/dL (ref 8–23)
CO2: 21 mmol/L — ABNORMAL LOW (ref 22–32)
Calcium: 8.7 mg/dL — ABNORMAL LOW (ref 8.9–10.3)
Chloride: 108 mmol/L (ref 98–111)
Creatinine, Ser: 1.26 mg/dL — ABNORMAL HIGH (ref 0.61–1.24)
GFR, Estimated: >60 mL/min (ref >60)
Glucose, Bld: 124 mg/dL — ABNORMAL HIGH (ref 70–99)
Potassium: 3.9 mmol/L (ref 3.5–5.1)
Sodium: 136 mmol/L (ref 135–145)

## 2023-02-02 LAB — GLUCOSE, CAPILLARY
Glucose-Capillary: 122 mg/dL — ABNORMAL HIGH (ref 70–99)
Glucose-Capillary: 158 mg/dL — ABNORMAL HIGH (ref 70–99)
Glucose-Capillary: 192 mg/dL — ABNORMAL HIGH (ref 70–99)
Glucose-Capillary: 138 mg/dL — ABNORMAL HIGH (ref 70–99)

## 2023-02-02 LAB — MAGNESIUM: Magnesium: 2.2 mg/dL (ref 1.7–2.4)

## 2023-02-02 MED ORDER — LOSARTAN POTASSIUM 25 MG PO TABS
12.5000 mg | ORAL_TABLET | Freq: Once | ORAL | Status: AC
Start: 2023-02-02 — End: 2023-02-02
  Administered 2023-02-02: 12.5 mg via ORAL
  Filled 2023-02-02: qty 1

## 2023-02-02 MED ORDER — SPIRONOLACTONE 25 MG PO TABS
25.0000 mg | ORAL_TABLET | Freq: Every day | ORAL | Status: DC
  Administered 2023-02-04: 25 mg via ORAL
  Filled 2023-02-02 (×2): qty 1

## 2023-02-02 MED ORDER — LOSARTAN POTASSIUM 25 MG PO TABS
25.0000 mg | ORAL_TABLET | Freq: Every day | ORAL | Status: DC
  Administered 2023-02-04: 25 mg via ORAL
  Filled 2023-02-02 (×2): qty 1

## 2023-02-02 NOTE — Progress Notes (Signed)
PROGRESS NOTE    Curtis Watkins  ZOX:[id number] DOB: 03-16-1955 DOA: 01/30/2023 PCP: Rema Fendt, NP    Brief Narrative:  67 year old with history of left foot wound, diabetic foot ulcer status post amputation of Dr Myrle Sheng 24, DM2, HLD, s/p NSTEMI, history of CHF EF 25% initially presented to [facility] for fistula.  X-ray showed suspicion of osteomyelitis, respiratory panel positive for rhinovirus.  Vancomycin and cefepime and transferred here for further management.  MRI positive for osteomyelitis, Dr. Lajoyce Corners planning on surgery Wednesday   Assessment & Plan:  Principal Problem:   Osteomyelitis of foot, left, acute (HCC) Active Problems:   DM2 (diabetes mellitus, type 2) (HCC)   HTN (hypertension)   S/P CABG x 4   HFrEF (heart failure with reduced ejection fraction) (HCC)   Sepsis due to cellulitis (HCC)     Sepsis secondary to recurrent left foot wound/osteomyelitis - MRI of the left foot is consistent with osteomyelitis.  Dr. Lajoyce Corners planning on surgery on Wednesday. Current antibiotics: Daptomycin and cefepime  Coronary artery disease status post CABG Currently chest pain-free.  Continue aspirin, statin   Congestive heart failure with reduced EF, 25% Uncontrolled HTN Previously EF has shown 25%.  Continue home medications including aspirin, statin, Toprol. Increase Losartan and Aldactone today   CKD stage IIIb Creatinine at baseline 1.6   Type 2 diabetes A1c 7.6.  Supposed to be on Jardiance and glimepiride.  Currently on sliding scale and Accu-Cheks.    Hyperlipidemia  Continue statin.   Positive rhinovirus Diagnosed at outside hospital.  Supportive care   Smoker Denied nicotine patch    DVT prophylaxis: heparin injection 5,000 Units Start: 01/30/23 2200 Code Status: Full code Family Communication:   Status is: Inpatient Remains inpatient appropriate because: Surgery on Wednesday, likely discharge on Thursday    Subjective: Feeling okay no  complaints otherwise  Examination:  General exam: Appears calm and comfortable  Respiratory system: Clear to auscultation. Respiratory effort normal. Cardiovascular system: S1 & S2 heard, RRR. No JVD, murmurs, rubs, gallops or clicks. No pedal edema. Gastrointestinal system: Abdomen is nondistended, soft and nontender. No organomegaly or masses felt. Normal bowel sounds heard. Central nervous system: Alert and oriented. No focal neurological deficits. Extremities: Symmetric 5 x 5 power. Skin: No rashes, lesions or ulcers, left foot dressing in place Psychiatry: Judgement and insight appear normal. Mood & affect appropriate.                Diet Orders (From admission, onward)     Start     Ordered   01/30/23 1610  Diet Heart Room service appropriate? Yes; Fluid consistency: Thin  Diet effective now       Question Answer Comment  Room service appropriate? Yes   Fluid consistency: Thin      01/30/23 1611            Objective: Vitals:   02/02/23 0803 02/02/23 1011 02/02/23 1119 02/02/23 1250  BP: (!) 196/68 (!) 176/88 (!) 115/91 (!) 130/91  Pulse: 68  66   Resp: 16  15   Temp: 98 F (36.7 C)  97.8 F (36.6 C)   TempSrc: Oral  Oral   SpO2: 100%  100%   Weight:      Height:        Intake/Output Summary (Last 24 hours) at 02/02/2023 1303 Last data filed at 02/02/2023 1020 Gross per 24 hour  Intake 980 ml  Output 1300 ml  Net -320 ml   [employer]  01/30/23 1555  Weight: 76 kg    Scheduled Meds:  aspirin  162 mg Oral Daily   atorvastatin  20 mg Oral Daily   dorzolamide-timolol  1 drop Left Eye BID   heparin  5,000 Units Subcutaneous Q8H   insulin aspart  0-15 Units Subcutaneous TID WC   [START ON 02/03/2023] losartan  25 mg Oral Daily   metoprolol succinate  12.5 mg Oral Daily   [START ON 02/03/2023] spironolactone  25 mg Oral Daily   Continuous Infusions:  ceFEPime (MAXIPIME) IV Stopped (02/02/23 4098)   DAPTOmycin Stopped (02/01/23 1640)     Nutritional status     Body mass index is 24.74 kg/m.  Data Reviewed:   CBC: Recent Labs  Lab 01/30/23 1631 01/31/23 0757 02/01/23 0305 02/02/23 0316  WBC 19.6* 14.4* 10.2 7.3  HGB 12.6* 13.5 12.4* 13.4  HCT 37.6* 40.2 36.9* 39.5  MCV 94.5 94.6 92.7 92.1  PLT 167 184 176 180   Basic Metabolic Panel: Recent Labs  Lab 01/30/23 1631 01/31/23 0757 02/01/23 0305 02/02/23 0316  NA 135 137 137 136  K 3.8 4.3 3.7 3.9  CL 107 107 108 108  CO2 19* 21* 20* 21*  GLUCOSE 184* 98 107* 124*  BUN 31* 25* 23 23  CREATININE 1.74* 1.60* 1.22 1.26*  CALCIUM 8.2* 8.9 8.4* 8.7*  MG  --   --  2.1 2.2  PHOS  --   --  2.7  --    GFR: Estimated Creatinine Clearance: 56.9 mL/min (A) (by C-G formula based on SCr of 1.26 mg/dL (H)). Liver Function Tests: Recent Labs  Lab 01/30/23 1631  AST 16  ALT 13  ALKPHOS 62  BILITOT 0.7  PROT 6.3*  ALBUMIN 2.8*   No results for input(s): "LIPASE", "AMYLASE" in the last 168 hours. No results for input(s): "AMMONIA" in the last 168 hours. Coagulation Profile: No results for input(s): "INR", "PROTIME" in the last 168 hours. Cardiac Enzymes: Recent Labs  Lab 01/31/23 0757  CKTOTAL 91   BNP (last 3 results) No results for input(s): "PROBNP" in the last 8760 hours. HbA1C: Recent Labs    01/30/23 1646  HGBA1C 7.6*   CBG: Recent Labs  Lab 02/01/23 1112 02/01/23 1648 02/01/23 2104 02/02/23 0606 02/02/23 1121  GLUCAP 150* 119* 149* 122* 158*   Lipid Profile: No results for input(s): "CHOL", "HDL", "LDLCALC", "TRIG", "CHOLHDL", "LDLDIRECT" in the last 72 hours. Thyroid Function Tests: No results for input(s): "TSH", "T4TOTAL", "FREET4", "T3FREE", "THYROIDAB" in the last 72 hours. Anemia Panel: No results for input(s): "VITAMINB12", "FOLATE", "FERRITIN", "TIBC", "IRON", "RETICCTPCT" in the last 72 hours. Sepsis Labs: No results for input(s): "PROCALCITON", "LATICACIDVEN" in the last 168 hours.  Recent Results (from the past  240 hour(s))  SARS Coronavirus 2 by RT PCR (hospital order, performed in [facility] hospital lab) *cepheid single result test* Anterior Nasal Swab     Status: None   Collection Time: 01/30/23  4:13 PM   Specimen: Anterior Nasal Swab  Result Value Ref Range Status   SARS Coronavirus 2 by RT PCR NEGATIVE NEGATIVE Final    Comment: Performed at [facility], [address], [place], [place] [address]  Respiratory (~20 pathogens) panel by PCR     Status: Abnormal   Collection Time: 01/30/23  4:14 PM   Specimen: Nasopharyngeal Swab; Respiratory  Result Value Ref Range Status   Adenovirus NOT DETECTED NOT DETECTED Final   Coronavirus 229E NOT DETECTED NOT DETECTED Final  Comment: (NOTE) The Coronavirus on the Respiratory Panel, DOES NOT test for the novel  Coronavirus (2019 nCoV)    Coronavirus HKU1 NOT DETECTED NOT DETECTED Final   Coronavirus NL63 NOT DETECTED NOT DETECTED Final   Coronavirus OC43 NOT DETECTED NOT DETECTED Final   Metapneumovirus NOT DETECTED NOT DETECTED Final   Rhinovirus / Enterovirus DETECTED (A) NOT DETECTED Final   Influenza A NOT DETECTED NOT DETECTED Final   Influenza B NOT DETECTED NOT DETECTED Final   Parainfluenza Virus 1 NOT DETECTED NOT DETECTED Final   Parainfluenza Virus 2 NOT DETECTED NOT DETECTED Final   Parainfluenza Virus 3 NOT DETECTED NOT DETECTED Final   Parainfluenza Virus 4 NOT DETECTED NOT DETECTED Final   Respiratory Syncytial Virus NOT DETECTED NOT DETECTED Final   Bordetella pertussis NOT DETECTED NOT DETECTED Final   Bordetella Parapertussis NOT DETECTED NOT DETECTED Final   Chlamydophila pneumoniae NOT DETECTED NOT DETECTED Final   Mycoplasma pneumoniae NOT DETECTED NOT DETECTED Final    Comment: Performed at [facility], [address] [place], [place] [address]  Culture, blood (Routine X 2) w Reflex to ID Panel     Status: None (Preliminary result)   Collection Time: 01/30/23  4:44 PM   Specimen: BLOOD RIGHT ARM   Result Value Ref Range Status   Specimen Description BLOOD RIGHT ARM  Final   Special Requests   Final    BOTTLES DRAWN AEROBIC AND ANAEROBIC Blood Culture results may not be optimal due to an inadequate volume of blood received in culture bottles   Culture   Final    NO GROWTH 3 DAYS Performed at [facility], [address] [place], [place] [address]    Report Status PENDING  Incomplete  Culture, blood (Routine X 2) w Reflex to ID Panel     Status: None (Preliminary result)   Collection Time: 01/30/23  4:46 PM   Specimen: BLOOD RIGHT ARM  Result Value Ref Range Status   Specimen Description BLOOD RIGHT ARM  Final   Special Requests   Final    BOTTLES DRAWN AEROBIC AND ANAEROBIC Blood Culture results may not be optimal due to an inadequate volume of blood received in culture bottles   Culture   Final    NO GROWTH 3 DAYS Performed at [facility], [address] [place], [place] [address]    Report Status PENDING  Incomplete         Radiology Studies: MR FOOT LEFT WO CONTRAST  Result Date: 02/01/2023 CLINICAL DATA:  Osteomyelitis of the foot EXAM: MRI OF THE LEFT FOOT WITHOUT CONTRAST TECHNIQUE: Multiplanar, multisequence MR imaging of the left forefoot was performed. No intravenous contrast was administered. COMPARISON:  Radiographs 06/23/2022 FINDINGS: Bones/Joint/Cartilage Interval amputation the first digit including the first metatarsal and first digit phalanges. Diffuse abnormal edema in the medial cuneiform with a anterior erosion connecting to a tubular fluid collection in the soft tissues distal to the medial cuneiform. Subtle edema in the marrow of the middle cuneiform and in the base of the second metatarsal. Ligaments Lisfranc ligament intact. Muscles and Tendons Subtle diffuse muscular edema, possibly neurogenic. 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 Soft tissues 4.2 by 1.3 by 1.6 cm tubular abscess looping in the soft  tissues distal to the medial cuneiform and abutting the distal margin of the medial cuneiform. Adjacent cutaneous and subcutaneous edema favoring local cellulitis. IMPRESSION: 1. Osteomyelitis of the medial cuneiform with a tubular abscess  looping in the soft tissues distal to the medial cuneiform and abutting the distal margin of the medial cuneiform. 2. Subtle edema in the marrow of the middle cuneiform and base of the second metatarsal, equivocal for early osteomyelitis. 3. Interval amputation of the first digit including the first metatarsal and first digit phalanges. 4. Subtle diffuse muscular edema, possibly neurogenic. Electronically Signed   By: Gaylyn Rong M.D.   On: 02/01/2023 07:13           LOS: 3 days   Time spent= 35 mins    Miguel Rota, MD [facility]  If 7PM-7AM, please contact night-coverage  02/02/2023, 1:03 PM

## 2023-02-02 NOTE — Progress Notes (Addendum)
[facility] to [facility], DillonRN. Pt going to room 6.   Lawson Radar, RN

## 2023-02-03 ENCOUNTER — Encounter (HOSPITAL_COMMUNITY)
Admission: EM | Disposition: A | Discharge status: Home / Self Care | Payer: Self-pay | Source: Other Acute Inpatient Hospital | Attending: Internal Medicine

## 2023-02-03 ENCOUNTER — Encounter (HOSPITAL_COMMUNITY): Payer: Self-pay | Admitting: Internal Medicine

## 2023-02-03 ENCOUNTER — Other Ambulatory Visit: Payer: Self-pay

## 2023-02-03 ENCOUNTER — Inpatient Hospital Stay (HOSPITAL_COMMUNITY): Payer: 59

## 2023-02-03 ENCOUNTER — Ambulatory Visit: Payer: 59 | Admitting: Internal Medicine

## 2023-02-03 DIAGNOSIS — M869 Osteomyelitis, unspecified: Secondary | ICD-10-CM | POA: Diagnosis not present

## 2023-02-03 DIAGNOSIS — I10 Essential (primary) hypertension: Secondary | ICD-10-CM

## 2023-02-03 DIAGNOSIS — M86172 Other acute osteomyelitis, left ankle and foot: Secondary | ICD-10-CM | POA: Diagnosis not present

## 2023-02-03 DIAGNOSIS — L0390 Cellulitis, unspecified: Secondary | ICD-10-CM

## 2023-02-03 DIAGNOSIS — I5020 Unspecified systolic (congestive) heart failure: Secondary | ICD-10-CM

## 2023-02-03 DIAGNOSIS — E1169 Type 2 diabetes mellitus with other specified complication: Secondary | ICD-10-CM

## 2023-02-03 DIAGNOSIS — A419 Sepsis, unspecified organism: Secondary | ICD-10-CM | POA: Diagnosis not present

## 2023-02-03 HISTORY — PX: METATARSAL HEAD EXCISION: SHX5027

## 2023-02-03 LAB — CBC
HCT: 38.4 % — ABNORMAL LOW (ref 39.0–52.0)
Hemoglobin: 13.2 g/dL (ref 13.0–17.0)
MCH: 31.6 pg (ref 26.0–34.0)
MCHC: 34.4 g/dL (ref 30.0–36.0)
MCV: 91.9 fL (ref 80.0–100.0)
Platelets: 193 10*3/uL (ref 150–400)
RBC: 4.18 MIL/uL — ABNORMAL LOW (ref 4.22–5.81)
RDW: 13.5 % (ref 11.5–15.5)
WBC: 6.2 10*3/uL (ref 4.0–10.5)
nRBC: 0 % (ref 0.0–0.2)

## 2023-02-03 LAB — GLUCOSE, CAPILLARY
Glucose-Capillary: 133 mg/dL — ABNORMAL HIGH (ref 70–99)
Glucose-Capillary: 90 mg/dL (ref 70–99)
Glucose-Capillary: 100 mg/dL — ABNORMAL HIGH (ref 70–99)
Glucose-Capillary: 106 mg/dL — ABNORMAL HIGH (ref 70–99)
Glucose-Capillary: 260 mg/dL — ABNORMAL HIGH (ref 70–99)

## 2023-02-03 LAB — BASIC METABOLIC PANEL
Anion gap: 8 (ref 5–15)
BUN: 25 mg/dL — ABNORMAL HIGH (ref 8–23)
CO2: 18 mmol/L — ABNORMAL LOW (ref 22–32)
Calcium: 9.1 mg/dL (ref 8.9–10.3)
Chloride: 110 mmol/L (ref 98–111)
Creatinine, Ser: 1.39 mg/dL — ABNORMAL HIGH (ref 0.61–1.24)
GFR, Estimated: 56 mL/min — ABNORMAL LOW (ref >60)
Glucose, Bld: 129 mg/dL — ABNORMAL HIGH (ref 70–99)
Potassium: 3.8 mmol/L (ref 3.5–5.1)
Sodium: 136 mmol/L (ref 135–145)

## 2023-02-03 LAB — MAGNESIUM: Magnesium: 2.1 mg/dL (ref 1.7–2.4)

## 2023-02-03 LAB — SURGICAL PCR SCREEN
MRSA, PCR: NEGATIVE
Staphylococcus aureus: NEGATIVE

## 2023-02-03 SURGERY — EXCISION, METATARSAL BONE, HEAD
Anesthesia: Monitor Anesthesia Care | Site: Foot | Laterality: Left

## 2023-02-03 MED ORDER — ORAL CARE MOUTH RINSE: 15.0000 mL | Freq: Once | OROMUCOSAL | Status: DC

## 2023-02-03 MED ORDER — 0.9 % SODIUM CHLORIDE (POUR BTL) OPTIME
TOPICAL | Status: DC | PRN
Start: 2023-02-03 — End: 2023-02-03
  Administered 2023-02-03: 1000 mL

## 2023-02-03 MED ORDER — ACETAMINOPHEN 500 MG PO TABS
ORAL_TABLET | ORAL | Status: AC
  Filled 2023-02-03: qty 2

## 2023-02-03 MED ORDER — FENTANYL CITRATE (PF) 100 MCG/2ML IJ SOLN
INTRAMUSCULAR | Status: AC
  Filled 2023-02-03: qty 2

## 2023-02-03 MED ORDER — ONDANSETRON HCL 4 MG/2ML IJ SOLN: 4.0000 mg | Freq: Once | INTRAMUSCULAR | Status: DC | PRN

## 2023-02-03 MED ORDER — METHOCARBAMOL 500 MG PO TABS: 500.0000 mg | ORAL_TABLET | Freq: Four times a day (QID) | ORAL | Status: DC | PRN

## 2023-02-03 MED ORDER — PROPOFOL 500 MG/50ML IV EMUL
INTRAVENOUS | Status: DC | PRN
  Administered 2023-02-03: 50 ug/kg/min via INTRAVENOUS

## 2023-02-03 MED ORDER — MIDAZOLAM HCL 2 MG/2ML IJ SOLN
INTRAMUSCULAR | Status: DC | PRN
  Administered 2023-02-03 (×2): 1 mg via INTRAVENOUS

## 2023-02-03 MED ORDER — HYDROMORPHONE HCL 1 MG/ML IJ SOLN: 0.2500 mg | INTRAMUSCULAR | Status: DC | PRN

## 2023-02-03 MED ORDER — ACETAMINOPHEN 500 MG PO TABS
1000.0000 mg | ORAL_TABLET | Freq: Once | ORAL | Status: AC
Start: 2023-02-03 — End: 2023-02-03
  Administered 2023-02-03: 1000 mg via ORAL

## 2023-02-03 MED ORDER — CHLORHEXIDINE GLUCONATE 0.12 % MT SOLN
OROMUCOSAL | Status: AC
  Administered 2023-02-03: 15 mL via OROMUCOSAL
  Filled 2023-02-03: qty 15

## 2023-02-03 MED ORDER — VANCOMYCIN HCL 1000 MG IV SOLR
INTRAVENOUS | Status: AC
  Filled 2023-02-03: qty 20

## 2023-02-03 MED ORDER — CHLORHEXIDINE GLUCONATE 0.12 % MT SOLN: 15.0000 mL | Freq: Once | OROMUCOSAL | Status: AC

## 2023-02-03 MED ORDER — OXYCODONE HCL 5 MG PO TABS: 5.0000 mg | ORAL_TABLET | Freq: Once | ORAL | Status: DC | PRN

## 2023-02-03 MED ORDER — VANCOMYCIN HCL 1000 MG IV SOLR
INTRAVENOUS | Status: DC | PRN
  Administered 2023-02-03: 1000 mg

## 2023-02-03 MED ORDER — CEFAZOLIN SODIUM-DEXTROSE 2-4 GM/100ML-% IV SOLN
2.0000 g | INTRAVENOUS | Status: AC
Start: 2023-02-03 — End: 2023-02-03
  Administered 2023-02-03: 2 g via INTRAVENOUS
  Filled 2023-02-03: qty 100

## 2023-02-03 MED ORDER — MIDAZOLAM HCL 2 MG/2ML IJ SOLN
INTRAMUSCULAR | Status: AC
  Filled 2023-02-03: qty 2

## 2023-02-03 MED ORDER — LIDOCAINE HCL (PF) 1 % IJ SOLN
INTRAMUSCULAR | Status: DC | PRN
  Administered 2023-02-03: 30 mL

## 2023-02-03 MED ORDER — LACTATED RINGERS IV SOLN: INTRAVENOUS | Status: DC

## 2023-02-03 MED ORDER — OXYCODONE HCL 5 MG/5ML PO SOLN: 5.0000 mg | Freq: Once | ORAL | Status: DC | PRN

## 2023-02-03 MED ORDER — LIDOCAINE HCL (PF) 1 % IJ SOLN
INTRAMUSCULAR | Status: AC
  Filled 2023-02-03: qty 30

## 2023-02-03 MED ORDER — CHLORHEXIDINE GLUCONATE 4 % EX SOLN
60.0000 mL | Freq: Once | CUTANEOUS | Status: DC
  Filled 2023-02-03: qty 15

## 2023-02-03 MED ORDER — CEFAZOLIN SODIUM-DEXTROSE 2-4 GM/100ML-% IV SOLN
INTRAVENOUS | Status: AC
  Filled 2023-02-03: qty 100

## 2023-02-03 MED ORDER — INSULIN ASPART 100 UNIT/ML IJ SOLN: 0.0000 [IU] | INTRAMUSCULAR | Status: DC | PRN

## 2023-02-03 MED ORDER — FENTANYL CITRATE (PF) 100 MCG/2ML IJ SOLN
INTRAMUSCULAR | Status: DC | PRN
  Administered 2023-02-03: 50 ug via INTRAVENOUS
  Administered 2023-02-03: 25 ug via INTRAVENOUS

## 2023-02-03 MED ORDER — PHENYLEPHRINE HCL-NACL 20-0.9 MG/250ML-% IV SOLN
INTRAVENOUS | Status: DC | PRN
  Administered 2023-02-03: 30 ug/min via INTRAVENOUS

## 2023-02-03 MED ORDER — METHOCARBAMOL 1000 MG/10ML IJ SOLN: 500.0000 mg | Freq: Four times a day (QID) | INTRAMUSCULAR | Status: DC | PRN

## 2023-02-03 MED ORDER — CHLORHEXIDINE GLUCONATE 0.12 % MT SOLN: 15.0000 mL | Freq: Once | OROMUCOSAL | Status: DC

## 2023-02-03 MED ORDER — ORAL CARE MOUTH RINSE: 15.0000 mL | Freq: Once | OROMUCOSAL | Status: AC

## 2023-02-03 MED ORDER — SODIUM CHLORIDE 0.9 % IV SOLN: INTRAVENOUS | Status: DC

## 2023-02-03 MED ORDER — POVIDONE-IODINE 10 % EX SWAB
2.0000 | Freq: Once | CUTANEOUS | Status: AC
Start: 2023-02-03 — End: 2023-02-03
  Administered 2023-02-03: 2 via TOPICAL

## 2023-02-03 SURGICAL SUPPLY — 36 items
BAG COUNTER SPONGE SURGICOUNT (BAG) ×1 IMPLANT
BLADE SURG 21 STRL SS (BLADE) IMPLANT
BNDG COHESIVE 4X5 TAN STRL LF (GAUZE/BANDAGES/DRESSINGS) IMPLANT
BNDG ESMARK 4X9 LF (GAUZE/BANDAGES/DRESSINGS) ×1 IMPLANT
BNDG GAUZE DERMACEA FLUFF 4 (GAUZE/BANDAGES/DRESSINGS) IMPLANT
CLEANSER WND VASHE INSTL 34OZ (WOUND CARE) IMPLANT
CORD BIPOLAR FORCEPS 12FT (ELECTRODE) ×1 IMPLANT
COVER SURGICAL LIGHT HANDLE (MISCELLANEOUS) ×2 IMPLANT
DRAPE U-SHAPE 47X51 STRL (DRAPES) ×1 IMPLANT
DRSG ADAPTIC 3X8 NADH LF (GAUZE/BANDAGES/DRESSINGS) IMPLANT
DURAPREP 26ML APPLICATOR (WOUND CARE) ×1 IMPLANT
ELECT REM PT RETURN 9FT ADLT (ELECTROSURGICAL) ×1
ELECTRODE REM PT RTRN 9FT ADLT (ELECTROSURGICAL) ×1 IMPLANT
GAUZE PAD ABD 8X10 STRL (GAUZE/BANDAGES/DRESSINGS) IMPLANT
GAUZE SPONGE 4X4 12PLY STRL (GAUZE/BANDAGES/DRESSINGS) IMPLANT
GLOVE BIOGEL PI IND STRL 9 (GLOVE) ×1 IMPLANT
GLOVE SURG ORTHO 9.0 STRL STRW (GLOVE) ×1 IMPLANT
GOWN STRL REUS W/ TWL XL LVL3 (GOWN DISPOSABLE) ×2 IMPLANT
GRAFT SKIN WND MICRO 38 (Tissue) IMPLANT
KIT BASIN OR (CUSTOM PROCEDURE TRAY) ×1 IMPLANT
KIT TURNOVER KIT B (KITS) ×1 IMPLANT
MANIFOLD NEPTUNE II (INSTRUMENTS) ×1 IMPLANT
NDL HYPO 25X1 1.5 SAFETY (NEEDLE) IMPLANT
NEEDLE HYPO 25X1 1.5 SAFETY (NEEDLE) ×1 IMPLANT
NS IRRIG 1000ML POUR BTL (IV SOLUTION) ×1 IMPLANT
PACK ORTHO EXTREMITY (CUSTOM PROCEDURE TRAY) ×1 IMPLANT
PAD ARMBOARD 7.5X6 YLW CONV (MISCELLANEOUS) ×2 IMPLANT
SPECIMEN JAR SMALL (MISCELLANEOUS) ×1 IMPLANT
STOCKINETTE IMPERVIOUS LG (DRAPES) IMPLANT
SUCTION TUBE FRAZIER 10FR DISP (SUCTIONS) IMPLANT
SUT ETHILON 2 0 PSLX (SUTURE) IMPLANT
SYR CONTROL 10ML LL (SYRINGE) IMPLANT
TOWEL GREEN STERILE (TOWEL DISPOSABLE) ×1 IMPLANT
TOWEL GREEN STERILE FF (TOWEL DISPOSABLE) ×1 IMPLANT
TUBE CONNECTING 12X1/4 (SUCTIONS) IMPLANT
WATER STERILE IRR 1000ML POUR (IV SOLUTION) ×1 IMPLANT

## 2023-02-03 NOTE — Progress Notes (Signed)
[facility] Pre-op Review 1.Surgical orders: Consent orders:  Order in Epic Consent signed: waiting chat response from floor RN Patient alert and oriented: Antibiotic: Ancef 100mg  OCTOR Cefepime 2R 0558 Daptomycin 700mg  14/54 02/02/23 Pre-meds: Profend  2.  Pre-procedure checklist completed:  Completed by floor RN  3.  NPO:  waiting chat response from floor RN  4.  CHG Bath completed: waiting chat response from floor RN      Belongings removed and placed in clean gown:  5.  Labs Performed: CBC    Abnormal 02/03/23 CMP/BMP   Abnormal 02/03/23 PT/INR     HA1C   Abnormal 01/30/23 Type and Screen Surgical PCR:  Negative 02/03/23 Pregnancy:  NA EKG:    Active order from 01/31/23  Chest x-ray:   6.  Recent H&P or progress note if inpatient:  02/02/23  7.  Language Barrier:   NA  8.  Vital Signs:  Stable Oxygen:  RA Tele:   NA Can the patient travel without Tele  9.  Medications: Cardiac Drips:  NA Pain Medications:  Beta Blocker:   Metoprolol 12.5mg  due 0800 Anticoagulants:  Heparin 2241 02/02/23 GLP1:  10. IV access: 22 L FA  11. Diabetic:     CBG:   138   @    02/02/23 2234

## 2023-02-03 NOTE — Progress Notes (Signed)
[facility]                                                                               Curtis Watkins, is a 67 y.o. male, DOB - 01-17-1956, IHK:[id number] Admit date - 01/30/2023    Outpatient Primary MD for the patient is Zonia Kief, Salomon Fick, NP  LOS - 4  days    Brief summary   67 year old with history of left foot wound, diabetic foot ulcer status post amputation of Dr Myrle Sheng 24, DM2, HLD, s/p NSTEMI, history of CHF EF 25% initially presented to [facility] for fistula.  X-ray showed suspicion of osteomyelitis, respiratory panel positive for rhinovirus.  Vancomycin and cefepime and transferred here for further management.  MRI positive for osteomyelitis, Dr. Lajoyce Corners planning on surgery Wednesday     Assessment & Plan    Assessment and Plan:  Sepsis secondary to recurrent left foot wound/osteomyelitis Dr. Lajoyce Corners on board planning for OR today Continue with antibiotics and pain control   History of coronary artery disease s/p CABG Continue with aspirin and statin.    Congestive heart failure with reduced EF Patient appears euvolemic no signs of heart failure at this time Continue spironolactone, metoprolol, Cozaar, aspirin and Plavix   Essential hypertension Blood pressure parameters are optimal. Continue with Cozaar 25 mg daily, metoprolol 12.5 mg daily.    Stage IIIb CKD Creatinine at baseline at 1.6.   Type 2 diabetes mellitus with hyperglycemia Hemoglobin A1c at 7.6. CBG (last 3)  Recent Labs    02/02/23 2234 02/03/23 0817 02/03/23 1236  GLUCAP 138* 133* 90   Resume SSI.    Hyperlipidemia Continue with statin   Tobacco use disorder   Positive rhinovirus  Supportive care.     Estimated body mass index is 24.74 kg/m as calculated from the following:   Height as of this encounter: 5\' 9"  (1.753 m).   Weight as of this encounter: 76 kg.  Code Status: full code.  DVT Prophylaxis:  heparin injection 5,000 Units Start: 01/30/23  2200   Level of Care: Level of care: Med-Surg Family Communication: none at bedside  Disposition Plan:     Remains inpatient appropriate:  OR   Procedures:  Scheduled for OR today.   Consultants:   Dr Lajoyce Corners.   Antimicrobials:   Anti-infectives (From admission, onward)    Start     Dose/Rate Route Frequency Ordered Stop   02/03/23 0830  ceFAZolin (ANCEF) IVPB 2g/100 mL premix        2 g 200 mL/hr over 30 Minutes Intravenous On call to O.R. 02/03/23 0740 02/04/23 0559   01/31/23 0800  DAPTOmycin (CUBICIN) IVPB 700 mg/154mL premix        700 mg 200 mL/hr over 30 Minutes Intravenous Daily 01/30/23 1647     01/30/23 1800  ceFEPIme (MAXIPIME) 2 g in sodium chloride 0.9 % 100 mL IVPB        2 g 200 mL/hr over 30 Minutes Intravenous Every 12 hours 01/30/23 1647          Medications  Scheduled Meds:  aspirin  162 mg Oral Daily   atorvastatin  20 mg Oral Daily   chlorhexidine  60 mL Topical Once   dorzolamide-timolol  1 drop Left Eye BID   heparin  5,000 Units Subcutaneous Q8H   insulin aspart  0-15 Units Subcutaneous TID WC   losartan  25 mg Oral Daily   metoprolol succinate  12.5 mg Oral Daily   povidone-iodine  2 Application Topical Once   spironolactone  25 mg Oral Daily   Continuous Infusions:   ceFAZolin (ANCEF) IV     ceFEPime (MAXIPIME) IV 2 g (02/03/23 0558)   DAPTOmycin 700 mg (02/03/23 1324)   PRN Meds:.guaiFENesin, hydrALAZINE, HYDROmorphone (DILAUDID) injection, ipratropium-albuterol, metoprolol tartrate, ondansetron (ZOFRAN) IV, oxyCODONE-acetaminophen, polyethylene glycol, senna-docusate, traZODone    Subjective:   Curtis Watkins was seen and examined today.  Patient wants to talk to orthopedics regarding the procedure. Denies any pain.     Objective:   Vitals:   02/02/23 2232 02/03/23 0041 02/03/23 0457 02/03/23 0819  BP: 133/79 123/80 132/87 116/82  Pulse: 70 72 71 69  Resp: 17 17 18 18   Temp: 98 F (36.7 C) 98.1 F (36.7 C) 98.1 F (36.7 C) 98  F (36.7 C)  TempSrc: Oral Oral Oral Oral  SpO2: 100% 100% 100% 100%  Weight:      Height:        Intake/Output Summary (Last 24 hours) at 02/03/2023 1339 Last data filed at 02/03/2023 0819 Gross per 24 hour  Intake 439.93 ml  Output --  Net 439.93 ml   Filed Weights   01/30/23 1555  Weight: 76 kg     Exam General exam: Appears calm and comfortable  Respiratory system: Clear to auscultation. Respiratory effort normal. Cardiovascular system: S1 & S2 heard, RRR.  Gastrointestinal system: Abdomen is nondistended, soft and nontender. Central nervous system: Alert and oriented. Extremities:  left foot dressing in place.  Skin: No rashes, Psychiatry: mood is appropriate.    Data Reviewed:  I have personally reviewed following labs and imaging studies   CBC Lab Results  Component Value Date   WBC 6.2 02/03/2023   RBC 4.18 (L) 02/03/2023   HGB 13.2 02/03/2023   HCT 38.4 (L) 02/03/2023   MCV 91.9 02/03/2023   MCH 31.6 02/03/2023   PLT 193 02/03/2023   MCHC 34.4 02/03/2023   RDW 13.5 02/03/2023     Last metabolic panel Lab Results  Component Value Date   NA 136 02/03/2023   K 3.8 02/03/2023   CL 110 02/03/2023   CO2 18 (L) 02/03/2023   BUN 25 (H) 02/03/2023   CREATININE 1.39 (H) 02/03/2023   GLUCOSE 129 (H) 02/03/2023   GFRNONAA 56 (L) 02/03/2023   CALCIUM 9.1 02/03/2023   PHOS 2.7 02/01/2023   PROT 6.3 (L) 01/30/2023   ALBUMIN 2.8 (L) 01/30/2023   LABGLOB 4.2 08/02/2020   AGRATIO 1.0 (L) 08/02/2020   BILITOT 0.7 01/30/2023   ALKPHOS 62 01/30/2023   AST 16 01/30/2023   ALT 13 01/30/2023   ANIONGAP 8 02/03/2023    CBG (last 3)  Recent Labs    02/02/23 2234 02/03/23 0817 02/03/23 1236  GLUCAP 138* 133* 90      Coagulation Profile: No results for input(s): "INR", "PROTIME" in the last 168 hours.   Radiology Studies: No results found.     Kathlen Mody M.D. [facility] 02/03/2023, 1:39 PM  Available via Epic secure chat  7am-7pm After 7 pm, please refer to night coverage provider listed on amion.

## 2023-02-03 NOTE — Interval H&P Note (Signed)
History and Physical Interval Note:  02/03/2023 3:15 PM  Curtis Watkins  has presented today for surgery, with the diagnosis of Osteomyelitis Left Foot.  The various methods of treatment have been discussed with the patient and family. After consideration of risks, benefits and other options for treatment, the patient has consented to  Procedure(s): LEFT FOOT EXCISION MEDIAL CUNEIFORM (Left) as a surgical intervention.  The patient's history has been reviewed, patient examined, no change in status, stable for surgery.  I have reviewed the patient's chart and labs.  Questions were answered to the patient's satisfaction.     Nadara Mustard

## 2023-02-03 NOTE — Op Note (Signed)
02/03/2023  4:13 PM  PATIENT:  Curtis Watkins    PRE-OPERATIVE DIAGNOSIS:  Osteomyelitis Left Foot  POST-OPERATIVE DIAGNOSIS:  Same  PROCEDURE:  LEFT FOOT EXCISION MEDIAL CUNEIFORM Local tissue transfer for wound closure 11 x 4 cm. Application of topical vancomycin powder 1 g. Application of Kerecis micro graft 38 cm.  SURGEON:  Nadara Mustard, MD  PHYSICIAN ASSISTANT:None ANESTHESIA:   General  PREOPERATIVE INDICATIONS:  Curtis Watkins is a  67 y.o. male with a diagnosis of Osteomyelitis Left Foot who failed conservative measures and elected for surgical management.    The risks benefits and alternatives were discussed with the patient preoperatively including but not limited to the risks of infection, bleeding, nerve injury, cardiopulmonary complications, the need for revision surgery, among others, and the patient was willing to proceed.  OPERATIVE IMPLANTS:   Implant Name Type Inv. Item Serial No. Manufacturer Lot No. LRB No. Used Action  GRAFT SKIN WND MICRO 38 - [id number] Tissue GRAFT SKIN WND MICRO 38  [employer] [id number] Left 1 Implanted    @ENCIMAGES @  OPERATIVE FINDINGS: Tissue margins were clear.  Soft tissue sent for cultures.  OPERATIVE PROCEDURE: Patient was brought the operating room underwent a MAC anesthetic.  The left lower extremity was then prepped using DuraPrep draped into a sterile field a timeout was called.  Patient underwent a local block with 20 cc of 1% lidocaine plain.  Elliptical incision was made around the ulcerative tissue this was carried down along the medial column the necrotic tissue was sent for cultures.  The medial cuneiform was excised.  The wound was irrigated with Vashe irrigation and electrocautery was used for hemostasis.  The wound bed was filled with 1 g vancomycin powder and 38 cm Kerecis micro graft.  The tissue margins were undermined and local tissue rearrangement was used to close the wound 4 x 11 cm.  Sterile dressing was  applied patient was taken the PACU in stable condition.   DISCHARGE PLANNING:  Antibiotic duration: Continue current antibiotics for 24 hours then discontinue.  Weightbearing: Touchdown weightbearing on the left  Pain medication: Continue current pain medication  Dressing care/ Wound VAC: Dry dressing reinforce as needed  Ambulatory devices: Walker or crutches  Discharge to: Anticipate patient to discharge to home tomorrow.  Follow-up: In the office 1 week post operative.

## 2023-02-03 NOTE — Progress Notes (Signed)
Orthopedic Tech Progress Note Patient Details:  Curtis Watkins 06/17/1955 [id number]  Ortho Devices Type of Ortho Device: CAM walker Ortho Device/Splint Location: LLE Ortho Device/Splint Interventions: Application   Post Interventions Patient Tolerated: Well  Genelle Bal Theressa Piedra 02/03/2023, 6:35 PM

## 2023-02-03 NOTE — Anesthesia Preprocedure Evaluation (Signed)
Anesthesia Evaluation  Patient identified by MRN, date of birth, ID band Patient awake    Reviewed: Allergy & Precautions, H&P , NPO status , Patient's Chart, lab work & pertinent test results, reviewed documented beta blocker date and time   Airway Mallampati: II  TM Distance: >3 FB Neck ROM: Full    Dental  (+) Edentulous Upper   Pulmonary Current Smoker and Patient abstained from smoking.   Pulmonary exam normal breath sounds clear to auscultation       Cardiovascular hypertension (153/92 preop), Pt. on medications and Pt. on home beta blockers + CAD, + Past MI (NSTEMI 2022), + CABG (2022), + Peripheral Vascular Disease and +CHF (LVEF 45-50% recovered from 20% with NICM in 2022 2/2 NSTEMI)  Normal cardiovascular exam+ Valvular Problems/Murmurs (mild MR) MR  Rhythm:Regular Rate:Normal  Echo 02/2022  1. Left ventricular ejection fraction, by estimation, is 45 to 50%. The  left ventricle has mildly decreased function. The left ventricle  demonstrates global hypokinesis. Left ventricular diastolic parameters  were normal. The average left ventricular  global longitudinal strain is -17.8 %. The global longitudinal strain is  abnormal.   2. Right ventricular systolic function is normal. The right ventricular  size is normal. There is normal pulmonary artery systolic pressure. The  estimated right ventricular systolic pressure is 6.0 mmHg.   3. The mitral valve is grossly normal. Mild mitral valve regurgitation.  No evidence of mitral stenosis.   4. The aortic valve is tricuspid. Aortic valve regurgitation is not  visualized. No aortic stenosis is present.   5. The inferior vena cava is normal in size with greater than 50%  respiratory variability, suggesting right atrial pressure of 3 mmHg.      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)  negative psych ROS   GI/Hepatic negative GI ROS, Neg liver ROS,,,   Endo/Other  diabetes, Poorly Controlled, Type 2, Oral Hypoglycemic Agents    Renal/GU Renal Insufficiency and CRFRenal diseaseCr 1.39  negative genitourinary   Musculoskeletal Osteo L foot    Abdominal   Peds negative pediatric ROS (+)  Hematology negative hematology ROS (+)   Anesthesia Other Findings   Reproductive/Obstetrics negative OB ROS                              Anesthesia Physical Anesthesia Plan  ASA: 3  Anesthesia Plan: MAC   Post-op Pain Management: Tylenol PO (pre-op)*   Induction:   PONV Risk Score and Plan: 2 and Propofol infusion and TIVA  Airway Management Planned: Natural Airway and Simple Face Mask  Additional Equipment: None  Intra-op Plan:   Post-operative Plan:   Informed Consent: I have reviewed the patients History and Physical, chart, labs and discussed the procedure including the risks, benefits and alternatives for the proposed anesthesia with the patient or authorized representative who has indicated his/her understanding and acceptance.       Plan Discussed with: CRNA  Anesthesia Plan Comments:          Anesthesia Quick Evaluation

## 2023-02-03 NOTE — Interval H&P Note (Signed)
History and Physical Interval Note:  02/03/2023 6:50 AM  Curtis Watkins  has presented today for surgery, with the diagnosis of Osteomyelitis Left Foot.  The various methods of treatment have been discussed with the patient and family. After consideration of risks, benefits and other options for treatment, the patient has consented to  Procedure(s): LEFT FOOT EXCISION MEDIAL CUNEIFORM (Left) as a surgical intervention.  The patient's history has been reviewed, patient examined, no change in status, stable for surgery.  I have reviewed the patient's chart and labs.  Questions were answered to the patient's satisfaction.     Nadara Mustard

## 2023-02-03 NOTE — Transfer of Care (Signed)
Immediate Anesthesia Transfer of Care Note  Patient: Curtis Watkins  Procedure(s) Performed: LEFT FOOT EXCISION MEDIAL CUNEIFORM (Left: Foot)  Patient Location: PACU  Anesthesia Type:MAC  Level of Consciousness: awake, alert , and oriented  Airway & Oxygen Therapy: Patient Spontanous Breathing  Post-op Assessment: Report given to RN, Post -op Vital signs reviewed and stable, and Patient moving all extremities  Post vital signs: Reviewed and stable  Last Vitals:  Vitals Value Taken Time  BP 121/84 02/03/23 1608  Temp    Pulse 69 02/03/23 1613  Resp 13 02/03/23 1613  SpO2 100 % 02/03/23 1613  Vitals shown include unfiled device data.  Last Pain:  Vitals:   02/03/23 1428  TempSrc: Oral  PainSc:          Complications: No notable events documented.

## 2023-02-03 NOTE — Progress Notes (Signed)
Pharmacy Antibiotic Note  Curtis Watkins is a 67 y.o. male admitted on 01/30/2023 with  osteo .  Pharmacy has been consulted for dapto/cefepime dosing.  Pt presented to [facility] ED for osteo. 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 Subsequently, he was tx to [facility] for further management. D/w Dr Renford Dills and we will use Daptomycin here instead. Reduce his atorvastatin to 20mg  while on dapto.   Scheduled for OR today (12/11) for removal of the medial cuneiform placement of vancomycin powder and Kerecis micro graft.   Scr 1.39, CrCl = 51.6, CK 91 (12/8)  Plan: Daptomycin 700mg  IV qday (~9mg /kg) Cefepime 2g IV q12 Weekly ck  Height: 5\' 9"  (175.3 cm) Weight: 76 kg (167 lb 8.8 oz) IBW/kg (Calculated) : 70.7  Temp (24hrs), Avg:98 F (36.7 C), Min:97.8 F (36.6 C), Max:98.1 F (36.7 C)  Recent Labs  Lab 01/30/23 1631 01/31/23 0757 02/01/23 0305 02/02/23 0316 02/03/23 0607  WBC 19.6* 14.4* 10.2 7.3 6.2  CREATININE 1.74* 1.60* 1.22 1.26* 1.39*    Estimated Creatinine Clearance: 51.6 mL/min (A) (by C-G formula based on SCr of 1.39 mg/dL (H)).    Allergies  Allergen Reactions   Vancomycin      01/30/2023 had discrete hives, only in the arm where he was getting the infusion. Had brisk response to Benadryl. We then followed with a reduced rate of vancomycin, and he tolerated the entire dose with no further rash.       Antimicrobials this admission: 12/7 vanc 2g x1 12/7 zosyn x2 12/7 cefepime>> 12/7 dapto>>  Dose adjustments this admission:   Microbiology results: 12/7 blood ([place])>>  12/7 blood NGTD x 3 12/10 & 12/11 MRSA screen neg   Cedric Fishman, PharmD, BCPS, BCCCP Clinical Pharmacist

## 2023-02-03 NOTE — Plan of Care (Signed)
  Problem: Education: Goal: Knowledge of General Education information will improve Description: Including pain rating scale, medication(s)/side effects and non-pharmacologic comfort measures Outcome: Progressing   Problem: Safety: Goal: Ability to remain free from injury will improve Outcome: Progressing   Problem: Coping: Goal: Ability to adjust to condition or change in health will improve Outcome: Progressing   Problem: Skin Integrity: Goal: Risk for impaired skin integrity will decrease Outcome: Progressing

## 2023-02-04 ENCOUNTER — Encounter (HOSPITAL_COMMUNITY): Payer: Self-pay | Admitting: Orthopedic Surgery

## 2023-02-04 DIAGNOSIS — M86172 Other acute osteomyelitis, left ankle and foot: Secondary | ICD-10-CM | POA: Diagnosis not present

## 2023-02-04 DIAGNOSIS — E1169 Type 2 diabetes mellitus with other specified complication: Secondary | ICD-10-CM | POA: Diagnosis not present

## 2023-02-04 DIAGNOSIS — I10 Essential (primary) hypertension: Secondary | ICD-10-CM | POA: Diagnosis not present

## 2023-02-04 DIAGNOSIS — I5020 Unspecified systolic (congestive) heart failure: Secondary | ICD-10-CM | POA: Diagnosis not present

## 2023-02-04 LAB — MAGNESIUM: Magnesium: 2.1 mg/dL (ref 1.7–2.4)

## 2023-02-04 LAB — CBC
HCT: 35.2 % — ABNORMAL LOW (ref 39.0–52.0)
Hemoglobin: 11.9 g/dL — ABNORMAL LOW (ref 13.0–17.0)
MCH: 31.2 pg (ref 26.0–34.0)
MCHC: 33.8 g/dL (ref 30.0–36.0)
MCV: 92.4 fL (ref 80.0–100.0)
Platelets: 183 10*3/uL (ref 150–400)
RBC: 3.81 MIL/uL — ABNORMAL LOW (ref 4.22–5.81)
RDW: 13.4 % (ref 11.5–15.5)
WBC: 8.3 10*3/uL (ref 4.0–10.5)
nRBC: 0 % (ref 0.0–0.2)

## 2023-02-04 LAB — BASIC METABOLIC PANEL
Anion gap: 9 (ref 5–15)
BUN: 26 mg/dL — ABNORMAL HIGH (ref 8–23)
CO2: 17 mmol/L — ABNORMAL LOW (ref 22–32)
Calcium: 8.7 mg/dL — ABNORMAL LOW (ref 8.9–10.3)
Chloride: 109 mmol/L (ref 98–111)
Creatinine, Ser: 1.3 mg/dL — ABNORMAL HIGH (ref 0.61–1.24)
GFR, Estimated: >60 mL/min (ref >60)
Glucose, Bld: 121 mg/dL — ABNORMAL HIGH (ref 70–99)
Potassium: 4 mmol/L (ref 3.5–5.1)
Sodium: 135 mmol/L (ref 135–145)

## 2023-02-04 LAB — CULTURE, BLOOD (ROUTINE X 2)
Culture: NO GROWTH
Culture: NO GROWTH

## 2023-02-04 LAB — GLUCOSE, CAPILLARY: Glucose-Capillary: 249 mg/dL — ABNORMAL HIGH (ref 70–99)

## 2023-02-04 NOTE — Progress Notes (Signed)
Patient ID: Curtis Watkins, male   DOB: 24-Dec-1955, 67 y.o.   MRN: [id number] Patient is postoperative day 1 debridement abscess and excision of the medial cuneiform left foot.  Vancomycin was placed within the wound and Kerecis tissue graft.  The abscess tissue was sent for cultures.  Will plan on following up on this after discharge.  Patient may discharge to home at this time no antibiotics necessary.  Patient should minimize weightbearing on the left foot in the postoperative shoe.

## 2023-02-04 NOTE — Anesthesia Postprocedure Evaluation (Signed)
Anesthesia Post Note  Patient: Curtis Watkins  Procedure(s) Performed: LEFT FOOT EXCISION MEDIAL CUNEIFORM (Left: Foot)     Patient location during evaluation: PACU Anesthesia Type: MAC Level of consciousness: awake and alert Pain management: pain level controlled Vital Signs Assessment: post-procedure vital signs reviewed and stable Respiratory status: spontaneous breathing, nonlabored ventilation, respiratory function stable and patient connected to nasal cannula oxygen Cardiovascular status: stable and blood pressure returned to baseline Postop Assessment: no apparent nausea or vomiting Anesthetic complications: no   No notable events documented.  Last Vitals:  Vitals:   02/04/23 0050 02/04/23 0434  BP: 116/76 122/71  Pulse: 73 75  Resp: 17 16  Temp: 37 C 36.9 C  SpO2: 99% 99%    Last Pain:  Vitals:   02/04/23 0434  TempSrc: Oral  PainSc:                  [place]

## 2023-02-04 NOTE — Discharge Summary (Signed)
Physician Discharge Summary   Patient: Curtis Watkins MRN: [id number] DOB: [birth year]  Admit date:     01/30/2023  Discharge date: 02/04/2023  Discharge Physician: Kathlen Mody   PCP: Rema Fendt, NP   Recommendations at discharge:  Please follow up with orthopedics as recommended.    Discharge Diagnoses: Principal Problem:   Osteomyelitis of foot, left, acute (HCC) Active Problems:   DM2 (diabetes mellitus, type 2) (HCC)   HTN (hypertension)   S/P CABG x 4   HFrEF (heart failure with reduced ejection fraction) (HCC)   Sepsis due to cellulitis [facility])    Hospital Course: Brief Narrative:  67 year old with history of left foot wound, diabetic foot ulcer status post amputation of Dr Myrle Sheng 24, DM2, HLD, s/p NSTEMI, history of CHF EF 25% initially presented to [facility] for fistula.  X-ray showed suspicion of osteomyelitis, respiratory panel positive for rhinovirus.  Vancomycin and cefepime and transferred here for further management.  MRI positive for osteomyelitis, pt underwent   LEFT FOOT EXCISION MEDIAL CUNEIFORM. Local tissue transfer for wound closure 11 x 4 cm. Application of topical vancomycin powder 1 g. Application of Kerecis micro graft 38 cm.    Assessment and Plan:   Sepsis secondary to recurrent left foot wound/osteomyelitis Dr. Lajoyce Corners on board , and pt underwent left foot excision medial cuneiform.  Dr Lajoyce Corners recommended no indication of antibiotics on discharge.      History of coronary artery disease s/p CABG Continue with aspirin and statin.       Congestive heart failure with reduced EF Patient appears euvolemic no signs of heart failure at this time Continue spironolactone, metoprolol, Cozaar, aspirin and Plavix     Essential hypertension Blood pressure parameters are optimal. Continue with Cozaar 25 mg daily, metoprolol 12.5 mg daily.       Stage IIIb CKD Creatinine at baseline at 1.6.     Type 2 diabetes mellitus with  hyperglycemia Hemoglobin A1c at 7.6. resume home meds.      Hyperlipidemia Continue with statin     Tobacco use disorder     Positive rhinovirus  Supportive care.    Consultants: orthopedics.  Procedures performed: left foot excision medial cuneiform  Disposition: Home Diet recommendation:  Discharge Diet Orders (From admission, onward)     Start     Ordered   02/04/23 0000  Diet - low sodium heart healthy        02/04/23 1030           Carb modified diet DISCHARGE MEDICATION: Allergies as of 02/04/2023       Reactions   Vancomycin    01/30/2023 had discrete hives, only in the arm where he was getting the infusion. Had brisk response to Benadryl. We then followed with a reduced rate of vancomycin, and he tolerated the entire dose with no further rash.         Medication List     STOP taking these medications    erythromycin ophthalmic ointment   oxyCODONE-acetaminophen 5-325 MG tablet Commonly known as: PERCOCET/ROXICET       TAKE these medications    aspirin 325 MG tablet Take 162 mg by mouth in the morning.   atorvastatin 80 MG tablet Commonly known as: LIPITOR Take 1 tablet (80 mg total) by mouth daily. Pt needs to schedule appt with provider for further refills   blood glucose meter kit and supplies by Other route as directed. Dispense based on patient and insurance preference. Use up to  2x times daily as directed. (FOR ICD-10 E10.9, E11.9).   brimonidine 0.2 % ophthalmic solution Commonly known as: ALPHAGAN Place 1 drop into the left eye 2 (two) times daily.   dorzolamide-timolol 2-0.5 % ophthalmic solution Commonly known as: COSOPT Place 1 drop into the left eye 2 (two) times daily.   empagliflozin 25 MG Tabs tablet Commonly known as: JARDIANCE Take 1 tablet (25 mg total) by mouth daily before breakfast.   furosemide 20 MG tablet Commonly known as: LASIX Take 1 tablet (20 mg total) by mouth 3 (three) times a week. Take on Mondays,  Wednesdays, and Fridays.   glimepiride 1 MG tablet Commonly known as: AMARYL Take 1 tablet (1 mg total) by mouth daily before breakfast.   losartan 25 MG tablet Commonly known as: COZAAR Take 1/2 tablet (12.5 mg total) by mouth daily.   metoprolol succinate 25 MG 24 hr tablet Commonly known as: Toprol XL Take 1/2 tablet (12.5 mg total) by mouth daily.   spironolactone 25 MG tablet Commonly known as: ALDACTONE Take 1/2 tablet (12.5 mg total) by mouth daily.               Discharge Care Instructions  (From admission, onward)           Start     Ordered   02/03/23 0000  Touch down weight bearing       Question Answer Comment  Laterality left   Extremity Lower      02/03/23 1609            Follow-up Information     Nadara Mustard, MD Follow up in 1 week(s).   Specialty: Orthopedic Surgery Contact information: [address] [place] [address] [phone]                Discharge Exam: Ceasar Mons Weights   01/30/23 1555  Weight: 76 kg   General exam: Appears calm and comfortable  Respiratory system: Clear to auscultation. Respiratory effort normal. Cardiovascular system: S1 & S2 heard, RRR.  Gastrointestinal system: Abdomen is nondistended, soft and nontender.  Central nervous system: Alert and oriented. No focal neurological deficits. Extremities: Symmetric 5 x 5 power. Skin: No rashes, lesions or ulcers Psychiatry: Mood & affect appropriate.    Condition at discharge: fair  The results of significant diagnostics from this hospitalization (including imaging, microbiology, ancillary and laboratory) are listed below for reference.   Imaging Studies: MR FOOT LEFT WO CONTRAST Result Date: 02/01/2023 CLINICAL DATA:  Osteomyelitis of the foot EXAM: MRI OF THE LEFT FOOT WITHOUT CONTRAST TECHNIQUE: Multiplanar, multisequence MR imaging of the left forefoot was performed. No intravenous contrast was administered. COMPARISON:  Radiographs  06/23/2022 FINDINGS: Bones/Joint/Cartilage Interval amputation the first digit including the first metatarsal and first digit phalanges. Diffuse abnormal edema in the medial cuneiform with a anterior erosion connecting to a tubular fluid collection in the soft tissues distal to the medial cuneiform. Subtle edema in the marrow of the middle cuneiform and in the base of the second metatarsal. Ligaments Lisfranc ligament intact. Muscles and Tendons Subtle diffuse muscular edema, possibly neurogenic. 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 Soft tissues 4.2 by 1.3 by 1.6 cm tubular abscess looping in the soft tissues distal to the medial cuneiform and abutting the distal margin of the medial cuneiform. Adjacent cutaneous and subcutaneous edema favoring local cellulitis. IMPRESSION: 1. Osteomyelitis of the medial cuneiform with a tubular abscess looping in the soft tissues distal to the medial cuneiform and abutting  the distal margin of the medial cuneiform. 2. Subtle edema in the marrow of the middle cuneiform and base of the second metatarsal, equivocal for early osteomyelitis. 3. Interval amputation of the first digit including the first metatarsal and first digit phalanges. 4. Subtle diffuse muscular edema, possibly neurogenic. Electronically Signed   By: Gaylyn Rong M.D.   On: 02/01/2023 07:13    Microbiology: Results for orders placed or performed during the hospital encounter of 01/30/23  SARS Coronavirus 2 by RT PCR (hospital order, performed in [facility] hospital lab) *cepheid single result test* Anterior Nasal Swab     Status: None   Collection Time: 01/30/23  4:13 PM   Specimen: Anterior Nasal Swab  Result Value Ref Range Status   SARS Coronavirus 2 by RT PCR NEGATIVE NEGATIVE Final    Comment: Performed at [facility] Lab, [address] [place], [place] [address]  Respiratory (~20 pathogens) panel by PCR     Status: Abnormal   Collection Time:  01/30/23  4:14 PM   Specimen: Nasopharyngeal Swab; Respiratory  Result Value Ref Range Status   Adenovirus NOT DETECTED NOT DETECTED Final   Coronavirus 229E NOT DETECTED NOT DETECTED Final    Comment: (NOTE) The Coronavirus on the Respiratory Panel, DOES NOT test for the novel  Coronavirus (2019 nCoV)    Coronavirus HKU1 NOT DETECTED NOT DETECTED Final   Coronavirus NL63 NOT DETECTED NOT DETECTED Final   Coronavirus OC43 NOT DETECTED NOT DETECTED Final   Metapneumovirus NOT DETECTED NOT DETECTED Final   Rhinovirus / Enterovirus DETECTED (A) NOT DETECTED Final   Influenza A NOT DETECTED NOT DETECTED Final   Influenza B NOT DETECTED NOT DETECTED Final   Parainfluenza Virus 1 NOT DETECTED NOT DETECTED Final   Parainfluenza Virus 2 NOT DETECTED NOT DETECTED Final   Parainfluenza Virus 3 NOT DETECTED NOT DETECTED Final   Parainfluenza Virus 4 NOT DETECTED NOT DETECTED Final   Respiratory Syncytial Virus NOT DETECTED NOT DETECTED Final   Bordetella pertussis NOT DETECTED NOT DETECTED Final   Bordetella Parapertussis NOT DETECTED NOT DETECTED Final   Chlamydophila pneumoniae NOT DETECTED NOT DETECTED Final   Mycoplasma pneumoniae NOT DETECTED NOT DETECTED Final    Comment: Performed at [facility], [address], [place], [place] [address]  Culture, blood (Routine X 2) w Reflex to ID Panel     Status: None   Collection Time: 01/30/23  4:44 PM   Specimen: BLOOD RIGHT ARM  Result Value Ref Range Status   Specimen Description BLOOD RIGHT ARM  Final   Special Requests   Final    BOTTLES DRAWN AEROBIC AND ANAEROBIC Blood Culture results may not be optimal due to an inadequate volume of blood received in culture bottles   Culture   Final    NO GROWTH 5 DAYS Performed at [facility], [address], [place], [place] [address]    Report Status 02/04/2023 FINAL  Final  Culture, blood (Routine X 2) w Reflex to ID Panel     Status: None   Collection Time: 01/30/23  4:46 PM    Specimen: BLOOD RIGHT ARM  Result Value Ref Range Status   Specimen Description BLOOD RIGHT ARM  Final   Special Requests   Final    BOTTLES DRAWN AEROBIC AND ANAEROBIC Blood Culture results may not be optimal due to an inadequate volume of blood received in culture bottles   Culture   Final    NO GROWTH 5  DAYS Performed at [facility], [address] [place], [place] [address]    Report Status 02/04/2023 FINAL  Final  Surgical pcr screen     Status: None   Collection Time: 02/02/23  3:00 PM   Specimen: Nasal Mucosa; Nasal Swab  Result Value Ref Range Status   MRSA, PCR NEGATIVE NEGATIVE Final   Staphylococcus aureus NEGATIVE NEGATIVE Final    Comment: (NOTE) The Xpert SA Assay (FDA approved for NASAL specimens in patients 35 years of age and older), is one component of a comprehensive surveillance program. It is not intended to diagnose infection nor to guide or monitor treatment. Performed at [facility], [address] [place], [place] [address]   Surgical PCR screen     Status: None   Collection Time: 02/03/23  4:57 AM   Specimen: Nasal Mucosa; Nasal Swab  Result Value Ref Range Status   MRSA, PCR NEGATIVE NEGATIVE Final   Staphylococcus aureus NEGATIVE NEGATIVE Final    Comment: (NOTE) The Xpert SA Assay (FDA approved for NASAL specimens in patients 47 years of age and older), is one component of a comprehensive surveillance program. It is not intended to diagnose infection nor to guide or monitor treatment. Performed at [facility] Lab, [address], [place], [place] [address]     Labs: CBC: Recent Labs  Lab 01/31/23 0757 02/01/23 0305 02/02/23 0316 02/03/23 0607 02/04/23 0544  WBC 14.4* 10.2 7.3 6.2 8.3  HGB 13.5 12.4* 13.4 13.2 11.9*  HCT 40.2 36.9* 39.5 38.4* 35.2*  MCV 94.6 92.7 92.1 91.9 92.4  PLT 184 176 180 193 183   Basic Metabolic Panel: Recent Labs  Lab 01/31/23 0757 02/01/23 0305 02/02/23 0316 02/03/23 0607  02/04/23 0544  NA 137 137 136 136 135  K 4.3 3.7 3.9 3.8 4.0  CL 107 108 108 110 109  CO2 21* 20* 21* 18* 17*  GLUCOSE 98 107* 124* 129* 121*  BUN 25* 23 23 25* 26*  CREATININE 1.60* 1.22 1.26* 1.39* 1.30*  CALCIUM 8.9 8.4* 8.7* 9.1 8.7*  MG  --  2.1 2.2 2.1 2.1  PHOS  --  2.7  --   --   --    Liver Function Tests: Recent Labs  Lab 01/30/23 1631  AST 16  ALT 13  ALKPHOS 62  BILITOT 0.7  PROT 6.3*  ALBUMIN 2.8*   CBG: Recent Labs  Lab 02/03/23 1236 02/03/23 1408 02/03/23 1613 02/03/23 2003 02/04/23 0839  GLUCAP 90 100* 106* 260* 249*    Discharge time spent: 38 minutes.   Signed: Kathlen Mody, MD [facility] 02/04/2023

## 2023-02-04 NOTE — Progress Notes (Signed)
PT Cancellation Note  Patient Details Name: Curtis Watkins MRN: [id number] DOB: [birth year]   Cancelled Treatment:    Reason Eval/Treat Not Completed: PT screened, no needs identified, will sign off  Patient reports he has "been through this before" and knows what he needs to do. Inquired what MD told him about putting weight on his foot and he replied "limit how much I'm up on it." Educated in limiting his weight-bearing through left foot and need for walker. He reports he has a RW and knows what to do. States he has steps at home with rails and has no concerns re: stairs. Patient politely declined PT evaluation. RN updated.    Jerolyn Center, PT Acute Rehabilitation Services  Office [phone]   Zena Amos 02/04/2023, 12:01 PM

## 2023-02-04 NOTE — Progress Notes (Signed)
   02/04/23 1258  TOC Brief Assessment  Insurance and Status Reviewed  Patient has primary care physician Yes  Home environment has been reviewed wife  Prior level of function: independent  Prior/Current Home Services No current home services  Social Drivers of Health Review SDOH reviewed no interventions necessary  Readmission risk has been reviewed Yes  Transition of care needs no transition of care needs at this time   PT worked with patient : no needs identified

## 2023-02-04 NOTE — Plan of Care (Signed)
Discharge summary reviewed with patient and wife

## 2023-02-04 NOTE — Progress Notes (Signed)
Patient has been provided discharge instructions to include weight bearing and mobility precautions and follow up appointment. Patient verbalizes understanding of instructions.

## 2023-02-05 ENCOUNTER — Telehealth: Payer: Self-pay

## 2023-02-05 ENCOUNTER — Encounter (HOSPITAL_COMMUNITY): Payer: Self-pay | Admitting: Orthopedic Surgery

## 2023-02-05 NOTE — Transitions of Care (Post Inpatient/ED Visit) (Signed)
   02/05/2023  Name: Curtis Watkins MRN: [id number] DOB: 01/30/1956  Today's TOC FU Call Status: Today's TOC FU Call Status:: Successful TOC FU Call Completed TOC FU Call Complete Date: 02/05/23 Patient's Name and Date of Birth confirmed.  Transition Care Management Follow-up Telephone Call Date of Discharge: 02/04/23 Discharge Facility: [name]) Type of Discharge: Inpatient Admission Primary Inpatient Discharge Diagnosis:: Sepsis secondary to recurrent left foot wound/osteomyelitis, underwent left foot excision medial cunieform How have you been since you were released from the hospital?: Better Any questions or concerns?: No  Items Reviewed: Did you receive and understand the discharge instructions provided?: Yes Medications obtained,verified, and reconciled?: No (Transition of Care follow up call was brief - patient/significant other did not want to be questioned further - patient/significant other emphasized patient was fine and did not need anything.)  Medications Reviewed Today: Medications Reviewed Today   Medications were not reviewed in this encounter     Home Care and Equipment/Supplies:    Functional Questionnaire:    Follow up appointments reviewed:      Alyse Low, RN, BA, [facility], CRRN [facility] Population Health Care Management Coordinator, Transition of Care Ph # [phone]

## 2023-02-08 ENCOUNTER — Other Ambulatory Visit: Payer: Self-pay

## 2023-02-08 DIAGNOSIS — N1831 Chronic kidney disease, stage 3a: Secondary | ICD-10-CM

## 2023-02-08 DIAGNOSIS — I779 Disorder of arteries and arterioles, unspecified: Secondary | ICD-10-CM

## 2023-02-08 DIAGNOSIS — E1100 Type 2 diabetes mellitus with hyperosmolarity without nonketotic hyperglycemic-hyperosmolar coma (NKHHC): Secondary | ICD-10-CM

## 2023-02-08 DIAGNOSIS — I5022 Chronic systolic (congestive) heart failure: Secondary | ICD-10-CM

## 2023-02-08 DIAGNOSIS — Z720 Tobacco use: Secondary | ICD-10-CM

## 2023-02-08 DIAGNOSIS — I2510 Atherosclerotic heart disease of native coronary artery without angina pectoris: Secondary | ICD-10-CM

## 2023-02-08 DIAGNOSIS — I10 Essential (primary) hypertension: Secondary | ICD-10-CM

## 2023-02-08 DIAGNOSIS — I5020 Unspecified systolic (congestive) heart failure: Secondary | ICD-10-CM

## 2023-02-08 MED ORDER — FUROSEMIDE 20 MG PO TABS: 20.0000 mg | ORAL_TABLET | ORAL | 1 refills | Status: DC

## 2023-02-10 ENCOUNTER — Encounter: Payer: Self-pay | Admitting: Family

## 2023-02-10 ENCOUNTER — Ambulatory Visit (INDEPENDENT_AMBULATORY_CARE_PROVIDER_SITE_OTHER): Payer: 59 | Admitting: Family

## 2023-02-10 DIAGNOSIS — Z89432 Acquired absence of left foot: Secondary | ICD-10-CM

## 2023-02-10 LAB — AEROBIC/ANAEROBIC CULTURE W GRAM STAIN (SURGICAL/DEEP WOUND)

## 2023-02-10 NOTE — Progress Notes (Signed)
Post-Op Visit Note   Patient: Curtis Watkins           Date of Birth: 07-07-1955           MRN: [id number] Visit Date: 02/10/2023 PCP: Rema Fendt, NP  Chief Complaint:  Chief Complaint  Patient presents with   Left Foot - Routine Post Op    02/03/2023 left foot excision medial cuneiform    HPI:  HPI The patient is a 67 year old gentleman who is seen status post left foot excision of the medial cuneiform. Ortho Exam On examination left foot his incision is well-approximated no gaping or drainage no sign of infection  Visit Diagnoses: No diagnosis found.  Plan: Begin daily dose of cleansing.  Dry dressings.  Denies weightbearing.  Follow-Up Instructions: No follow-ups on file.   Imaging: No results found.  Orders:  No orders of the defined types were placed in this encounter.  No orders of the defined types were placed in this encounter.    PMFS History: Patient Active Problem List   Diagnosis Date Noted   Osteomyelitis of foot, left, acute (HCC) 01/30/2023   Sepsis due to cellulitis (HCC) 01/30/2023   Osteomyelitis of great toe of left foot (HCC) 09/04/2022   Cutaneous abscess of left foot 07/01/2022   Foreign body in left foot 06/23/2022   Influenza 03/06/2022   Elevated troponin 03/06/2022   Generalized weakness 03/05/2022   Trichomonas infection 07/31/2021   Erectile dysfunction 03/03/2021   HFrEF (heart failure with reduced ejection fraction) (HCC) 08/21/2020   Acute on chronic systolic and diastolic heart failure, NYHA class 3 (HCC) 05/25/2020   Pleural effusion    Gangrene of toe of right foot (HCC)    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   Coronary artery disease involving native coronary artery of native heart with unstable angina pectoris (HCC)    Acute congestive heart failure (HCC) 03/27/2020   Diabetic foot ulcers (HCC) 03/27/2020   DM2 (diabetes mellitus, type 2) (HCC) 03/27/2020   HTN (hypertension) 03/27/2020   Past Medical History:  Diagnosis Date    Blind right eye    Carotid artery disease (HCC)    04/01/20: 60-79% RICA stenosis, 1-39% LICA stenosis   Chronic kidney disease    ckd stage 3 a per dr Ronald Lobo 02-05-2021 epic   Coronary artery disease    NSTEMI s/p CABG in 03/2020 (LIMA-->LAD/diag, L radial-->OM1/OM3); b.   DM type 2    Glaucoma of right eye, unspecified glaucoma type    HFrEF (heart failure with reduced ejection fraction) (HCC)    Echo 6/22: EF 45, global HK, mild MR, RVSP 31.9   Hypertension    Ischemic cardiomyopathy    PAD (peripheral artery disease) (HCC)    occluded right axillary artery 03/29/20     Family History  Problem Relation Age of Onset   Diabetes Mother     Past Surgical History:  Procedure Laterality Date   AMPUTATION Right 04/24/2020   Procedure: RIGHT 2ND AND 3RD TOE AMPUTATION;  Surgeon: Nadara Mustard, MD;  [facility];  Service: Orthopedics;  Laterality: Right;   AMPUTATION Right 10/22/2021   Procedure: RIGHT TRANSMETATARSAL AMPUTATION;  Surgeon: Nadara Mustard, MD;  Location: [facility] OR;  Service: Orthopedics;  Laterality: Right;   AMPUTATION Left 09/04/2022   Procedure: LEFT 1ST RAY AMPUTATION;  Surgeon: Nadara Mustard, MD;  Location: [facility] OR;  Service: Orthopedics;  Laterality: Left;   APPLICATION OF WOUND VAC Right 10/22/2021   Procedure:  APPLICATION OF WOUND VAC;  Surgeon: Nadara Mustard, MD;  Location: [facility] OR;  Service: Orthopedics;  Laterality: Right;   CENTRAL VENOUS CATHETER INSERTION Left 04/03/2020   Procedure: INSERTION OF FEMORAL ARTERIAL LINE;  Surgeon: Linden Dolin, MD;  [facility]: [facility] OR;  Service: Open Heart Surgery;  Laterality: Left;   CORONARY ARTERY BYPASS GRAFT N/A 04/03/2020   Procedure: CORONARY ARTERY BYPASS GRAFTING (CABG) TIMES FOUR USING LEFT INTERNAL MAMMARY ARTERY AND LEFT RADIAL ARTERY.;  Surgeon: Linden Dolin, MD;  [facility]: [facility] OR;  Service: Open Heart Surgery;  Laterality: N/A;   CORONARY PRESSURE/FFR WITH 3D MAPPING N/A 03/29/2020   Procedure: Coronary  Pressure Wire/FFR w/3D Mapping;  Surgeon: Lyn Records, MD;  [facility] INVAS[facility] LAB;  Service: Cardiovascular;  Laterality: N/A;   EYE SURGERY Right 01/21/2021   right eye cataract lens replacement surgery   I & D EXTREMITY Left 07/01/2022   Procedure: LEFT FOOT DEBRIDEMENT AND FOREIGN BODY REMOVAL;  Surgeon: Nadara Mustard, MD;  Location: [facility] OR;  Service: Orthopedics;  Laterality: Left;   IR THORACENTESIS ASP PLEURAL SPACE W/IMG GUIDE  05/03/2020   IR THORACENTESIS ASP PLEURAL SPACE W/IMG GUIDE  05/16/2020   LEFT HEART CATH AND CORONARY ANGIOGRAPHY N/A 03/29/2020   Procedure: LEFT HEART CATH AND CORONARY ANGIOGRAPHY;  Surgeon: Lyn Records, MD;  [facility];  Service: Cardiovascular;  Laterality: N/A;   METATARSAL HEAD EXCISION Left 02/03/2023   Procedure: LEFT FOOT EXCISION MEDIAL CUNEIFORM;  Surgeon: Nadara Mustard, MD;  [facility] OR;  Service: Orthopedics;  Laterality: Left;   PENILE PROSTHESIS IMPLANT N/A 03/03/2021   Procedure: PENILE PROTHESIS INFLATABLE COLOPLAST;  Surgeon: Crista Elliot, MD;  Location: [facility];  Service: Urology;  Laterality: N/A;  REQUESTING 2 HRS   RADIAL ARTERY HARVEST Left 04/03/2020   Procedure: LEFT RADIAL ARTERY HARVEST;  Surgeon: Linden Dolin, MD;  [facility];  Service: Open Heart Surgery;  Laterality: Left;   TEE WITHOUT CARDIOVERSION N/A 04/03/2020   Procedure: TRANSESOPHAGEAL ECHOCARDIOGRAM (TEE);  Surgeon: Linden Dolin, MD;  Location: [facility] OR;  Service: Open Heart Surgery;  Laterality: N/A;   UPPER EXTREMITY ANGIOGRAPHY  03/29/2020   Procedure: Upper Extremity Angiography;  Surgeon: Lyn Records, MD;  Location: [facility] INVASIVE CV LAB;  Service: Cardiovascular;;  rt.  arm   Social History   Occupational History   Occupation: retired d/t medical conditions  Tobacco Use   Smoking status: Every Day    Current packs/day: 1.00    Average packs/day: 1 pack/day for 0.5 years (0.5 ttl pk-yrs)     Types: Cigarettes    Passive exposure: Current   Smokeless tobacco: Never  Vaping Use   Vaping status: Never Used  Substance and Sexual Activity   Alcohol use: Not Currently    Alcohol/week: 1.0 standard drink of alcohol    Types: 1 Cans of beer per week    Comment: occassionally beer   Drug use: Not Currently    Types: Marijuana    Comment: marijuana last use 3 xweek last used 02-23-2021   Sexual activity: Not Currently

## 2023-03-02 ENCOUNTER — Encounter: Payer: Self-pay | Admitting: Family

## 2023-03-02 ENCOUNTER — Ambulatory Visit (INDEPENDENT_AMBULATORY_CARE_PROVIDER_SITE_OTHER): Payer: 59 | Admitting: Family

## 2023-03-02 DIAGNOSIS — Z89432 Acquired absence of left foot: Secondary | ICD-10-CM

## 2023-03-02 MED ORDER — PENTOXIFYLLINE ER 400 MG PO TBCR: 400.0000 mg | EXTENDED_RELEASE_TABLET | Freq: Three times a day (TID) | ORAL | 3 refills | Status: DC

## 2023-03-02 NOTE — Progress Notes (Signed)
 Post-Op Visit Note   Patient: Curtis Watkins           Date of Birth: Oct 30, 1955           MRN: [id number] Visit Date: 03/02/2023 PCP: Lorren Greig PARAS, NP  Chief Complaint:  Chief Complaint  Patient presents with   Left Foot - Routine Post Op    02/03/2023 left foot excision medial cuneiform    HPI:  HPI The patient is a 68 year old gentleman seen status post left foot excision medial cuneiform December 11.  He has been nonweightbearing in a fracture boot denies any concerns Ortho Exam On examination left foot centrally there is 1 area that has not yet healed this is 3-1/2 cm x 1 cm filled in with full granulation this does probe medially 1 cm in depth.  Does not probe to bone.  Visit Diagnoses: No diagnosis found.  Plan: Continue daily dose of cleansing.  Given silver cell to pack open.  He will follow-up in 2 weeks sutures harvested today Follow-Up Instructions: No follow-ups on file.   Imaging: No results found.  Orders:  No orders of the defined types were placed in this encounter.  No orders of the defined types were placed in this encounter.    PMFS History: Patient Active Problem List   Diagnosis Date Noted   Osteomyelitis of foot, left, acute (HCC) 01/30/2023   Sepsis due to cellulitis (HCC) 01/30/2023   Osteomyelitis of great toe of left foot (HCC) 09/04/2022   Cutaneous abscess of left foot 07/01/2022   Foreign body in left foot 06/23/2022   Influenza 03/06/2022   Elevated troponin 03/06/2022   Generalized weakness 03/05/2022   Trichomonas infection 07/31/2021   Erectile dysfunction 03/03/2021   HFrEF (heart failure with reduced ejection fraction) (HCC) 08/21/2020   Acute on chronic systolic and diastolic heart failure, NYHA class 3 (HCC) 05/25/2020   Pleural effusion    Gangrene of toe of right foot (HCC)    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   Coronary artery disease involving native coronary artery of native heart with unstable angina pectoris (HCC)    Acute  congestive heart failure (HCC) 03/27/2020   Diabetic foot ulcers (HCC) 03/27/2020   DM2 (diabetes mellitus, type 2) (HCC) 03/27/2020   HTN (hypertension) 03/27/2020   Past Medical History:  Diagnosis Date   Blind right eye    Carotid artery disease (HCC)    04/01/20: 60-79% RICA stenosis, 1-39% LICA stenosis   Chronic kidney disease    ckd stage 3 a per dr hobart ronco 02-05-2021 epic   Coronary artery disease    NSTEMI s/p CABG in 03/2020 (LIMA-->LAD/diag, L radial-->OM1/OM3); b.   DM type 2    Glaucoma of right eye, unspecified glaucoma type    HFrEF (heart failure with reduced ejection fraction) (HCC)    Echo 6/22: EF 45, global HK, mild MR, RVSP 31.9   Hypertension    Ischemic cardiomyopathy    PAD (peripheral artery disease) (HCC)    occluded right axillary artery 03/29/20     Family History  Problem Relation Age of Onset   Diabetes Mother     Past Surgical History:  Procedure Laterality Date   AMPUTATION Right 04/24/2020   Procedure: RIGHT 2ND AND 3RD TOE AMPUTATION;  Surgeon: Harden Jerona GAILS, MD;  [facility];  Service: Orthopedics;  Laterality: Right;   AMPUTATION Right 10/22/2021   Procedure: RIGHT TRANSMETATARSAL AMPUTATION;  Surgeon: Harden Jerona GAILS, MD;  Location: [facility] OR;  Service: Orthopedics;  Laterality: Right;   AMPUTATION Left 09/04/2022   Procedure: LEFT 1ST RAY AMPUTATION;  Surgeon: Harden Jerona GAILS, MD;  Location: [facility] OR;  Service: Orthopedics;  Laterality: Left;   APPLICATION OF WOUND VAC Right 10/22/2021   Procedure: APPLICATION OF WOUND VAC;  Surgeon: Harden Jerona GAILS, MD;  [facility];  Service: Orthopedics;  Laterality: Right;   CENTRAL VENOUS CATHETER INSERTION Left 04/03/2020   Procedure: INSERTION OF FEMORAL ARTERIAL LINE;  Surgeon: German Bartlett PEDLAR, MD;  [facility];  Service: Open Heart Surgery;  Laterality: Left;   CORONARY ARTERY BYPASS GRAFT N/A 04/03/2020   Procedure: CORONARY ARTERY BYPASS GRAFTING (CABG) TIMES FOUR USING LEFT INTERNAL MAMMARY  ARTERY AND LEFT RADIAL ARTERY.;  Surgeon: German Bartlett PEDLAR, MD;  Location: [facility];  Service: Open Heart Surgery;  Laterality: N/A;   CORONARY PRESSURE/FFR WITH 3D MAPPING N/A 03/29/2020   Procedure: Coronary Pressure Wire/FFR w/3D Mapping;  Surgeon: Claudene Victory ORN, MD;  [facility];  Service: Cardiovascular;  Laterality: N/A;   EYE SURGERY Right 01/21/2021   right eye cataract lens replacement surgery   I & D EXTREMITY Left 07/01/2022   Procedure: LEFT FOOT DEBRIDEMENT AND FOREIGN BODY REMOVAL;  Surgeon: Harden Jerona GAILS, MD;  Location: [facility] OR;  Service: Orthopedics;  Laterality: Left;   IR THORACENTESIS ASP PLEURAL SPACE W/IMG GUIDE  05/03/2020   IR THORACENTESIS ASP PLEURAL SPACE W/IMG GUIDE  05/16/2020   LEFT HEART CATH AND CORONARY ANGIOGRAPHY N/A 03/29/2020   Procedure: LEFT HEART CATH AND CORONARY ANGIOGRAPHY;  Surgeon: Claudene Victory ORN, MD;  [facility];  Service: Cardiovascular;  Laterality: N/A;   METATARSAL HEAD EXCISION Left 02/03/2023   Procedure: LEFT FOOT EXCISION MEDIAL CUNEIFORM;  Surgeon: Harden Jerona GAILS, MD;  Location[facility];  Service: Orthopedics;  Laterality: Left;   PENILE PROSTHESIS IMPLANT N/A 03/03/2021   Procedure: PENILE PROTHESIS INFLATABLE COLOPLAST;  Surgeon: Carolee Sherwood JONETTA DOUGLAS, MD;  Location: [facility];  Service: Urology;  Laterality: N/A;  REQUESTING 2 HRS   RADIAL ARTERY HARVEST Left 04/03/2020   Procedure: LEFT RADIAL ARTERY HARVEST;  Surgeon: German Bartlett PEDLAR, MD;  Location: [facility] OR;  Service: Open Heart Surgery;  Laterality: Left;   TEE WITHOUT CARDIOVERSION N/A 04/03/2020   Procedure: TRANSESOPHAGEAL ECHOCARDIOGRAM (TEE);  Surgeon: German Bartlett PEDLAR, MD;  Location: [facility] OR;  Service: Open Heart Surgery;  Laterality: N/A;   UPPER EXTREMITY ANGIOGRAPHY  03/29/2020   Procedure: Upper Extremity Angiography;  Surgeon: Claudene Victory ORN, MD;  Location: [facility] INVASIVE CV LAB;  Service: Cardiovascular;;  rt.  arm   Social History    Occupational History   Occupation: retired d/t medical conditions  Tobacco Use   Smoking status: Every Day    Current packs/day: 1.00    Average packs/day: 1 pack/day for 0.5 years (0.5 ttl pk-yrs)    Types: Cigarettes    Passive exposure: Current   Smokeless tobacco: Never  Vaping Use   Vaping status: Never Used  Substance and Sexual Activity   Alcohol use: Not Currently    Alcohol/week: 1.0 standard drink of alcohol    Types: 1 Cans of beer per week    Comment: occassionally beer   Drug use: Not Currently    Types: Marijuana    Comment: marijuana last use 3 xweek last used 02-23-2021   Sexual activity: Not Currently

## 2023-03-16 ENCOUNTER — Ambulatory Visit (INDEPENDENT_AMBULATORY_CARE_PROVIDER_SITE_OTHER): Payer: 59 | Admitting: Family

## 2023-03-16 DIAGNOSIS — M86172 Other acute osteomyelitis, left ankle and foot: Secondary | ICD-10-CM

## 2023-03-17 ENCOUNTER — Encounter: Payer: Self-pay | Admitting: Family

## 2023-03-17 NOTE — Progress Notes (Signed)
Post-Op Visit Note   Patient: Curtis Watkins           Date of Birth: 29-Oct-1955           MRN: [id number] Visit Date: 03/16/2023 PCP: Rema Fendt, NP  Chief Complaint:  Chief Complaint  Patient presents with   Left Foot - Routine Post Op    02/03/2023 left foot excision medial cuneiform    HPI:  HPI The patient is a 68 year old gentleman who is seen status post left foot excision of the medial cuneiform December 11 he has been slow to he will heal.  There is 1 area in the center of his incision he has been packing open with silver cell.  No pain no new concerns Ortho Exam On examination left foot he has has sutures in place the send sure of his incision has 1 remaining open area 1 cm in diameter this probes 5 mm deep there is granulation in the wound bed there is no active drainage no warmth no edema Visit Diagnoses: No diagnosis found.  Plan: His incision was packed open with Kerecis micro powder, donation.  Kerecis micro powder applied to the satellite ulcer he will leave the scaffold dressing in place for 1 week.  Follow-Up Instructions: No follow-ups on file.   Imaging: No results found.  Orders:  No orders of the defined types were placed in this encounter.  No orders of the defined types were placed in this encounter.    PMFS History: Patient Active Problem List   Diagnosis Date Noted   Osteomyelitis of foot, left, acute (HCC) 01/30/2023   Sepsis due to cellulitis (HCC) 01/30/2023   Osteomyelitis of great toe of left foot (HCC) 09/04/2022   Cutaneous abscess of left foot 07/01/2022   Foreign body in left foot 06/23/2022   Influenza 03/06/2022   Elevated troponin 03/06/2022   Generalized weakness 03/05/2022   Trichomonas infection 07/31/2021   Erectile dysfunction 03/03/2021   HFrEF (heart failure with reduced ejection fraction) (HCC) 08/21/2020   Acute on chronic systolic and diastolic heart failure, NYHA class 3 (HCC) 05/25/2020   Pleural effusion     Gangrene of toe of right foot (HCC)    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   Coronary artery disease involving native coronary artery of native heart with unstable angina pectoris (HCC)    Acute congestive heart failure (HCC) 03/27/2020   Diabetic foot ulcers (HCC) 03/27/2020   DM2 (diabetes mellitus, type 2) (HCC) 03/27/2020   HTN (hypertension) 03/27/2020   Past Medical History:  Diagnosis Date   Blind right eye    Carotid artery disease (HCC)    04/01/20: 60-79% RICA stenosis, 1-39% LICA stenosis   Chronic kidney disease    ckd stage 3 a per dr Ronald Lobo 02-05-2021 epic   Coronary artery disease    NSTEMI s/p CABG in 03/2020 (LIMA-->LAD/diag, L radial-->OM1/OM3); b.   DM type 2    Glaucoma of right eye, unspecified glaucoma type    HFrEF (heart failure with reduced ejection fraction) (HCC)    Echo 6/22: EF 45, global HK, mild MR, RVSP 31.9   Hypertension    Ischemic cardiomyopathy    PAD (peripheral artery disease) (HCC)    occluded right axillary artery 03/29/20     Family History  Problem Relation Age of Onset   Diabetes Mother     Past Surgical History:  Procedure Laterality Date   AMPUTATION Right 04/24/2020   Procedure: RIGHT 2ND AND 3RD TOE  AMPUTATION;  Surgeon: Nadara Mustard, MD;  Location: [facility] OR;  Service: Orthopedics;  Laterality: Right;   AMPUTATION Right 10/22/2021   Procedure: RIGHT TRANSMETATARSAL AMPUTATION;  Surgeon: Nadara Mustard, MD;  Location: [facility] OR;  Service: Orthopedics;  Laterality: Right;   AMPUTATION Left 09/04/2022   Procedure: LEFT 1ST RAY AMPUTATION;  Surgeon: Nadara Mustard, MD;  Location: [facility] OR;  Service: Orthopedics;  Laterality: Left;   APPLICATION OF WOUND VAC Right 10/22/2021   Procedure: APPLICATION OF WOUND VAC;  Surgeon: Nadara Mustard, MD;  [facility];  Service: Orthopedics;  Laterality: Right;   CENTRAL VENOUS CATHETER INSERTION Left 04/03/2020   Procedure: INSERTION OF FEMORAL ARTERIAL LINE;  Surgeon: Linden Dolin, MD;  Location[facility] OR;   Service: Open Heart Surgery;  Laterality: Left;   CORONARY ARTERY BYPASS GRAFT N/A 04/03/2020   Procedure: CORONARY ARTERY BYPASS GRAFTING (CABG) TIMES FOUR USING LEFT INTERNAL MAMMARY ARTERY AND LEFT RADIAL ARTERY.;  Surgeon: Linden Dolin, MD;  Location: [facility] OR;  Service: Open Heart Surgery;  Laterality: N/A;   CORONARY PRESSURE/FFR WITH 3D MAPPING N/A 03/29/2020   Procedure: Coronary Pressure Wire/FFR w/3D Mapping;  Surgeon: Lyn Records, MD;  [facility] INVAS[facility] LAB;  Service: Cardiovascular;  Laterality: N/A;   EYE SURGERY Right 01/21/2021   right eye cataract lens replacement surgery   I & D EXTREMITY Left 07/01/2022   Procedure: LEFT FOOT DEBRIDEMENT AND FOREIGN BODY REMOVAL;  Surgeon: Nadara Mustard, MD;  Location: [facility] OR;  Service: Orthopedics;  Laterality: Left;   IR THORACENTESIS ASP PLEURAL SPACE W/IMG GUIDE  05/03/2020   IR THORACENTESIS ASP PLEURAL SPACE W/IMG GUIDE  05/16/2020   LEFT HEART CATH AND CORONARY ANGIOGRAPHY N/A 03/29/2020   Procedure: LEFT HEART CATH AND CORONARY ANGIOGRAPHY;  Surgeon: Lyn Records, MD;  [facility]: [facility] INVAS[facility] LAB;  Service: Cardiovascular;  Laterality: N/A;   METATARSAL HEAD EXCISION Left 02/03/2023   Procedure: LEFT FOOT EXCISION MEDIAL CUNEIFORM;  Surgeon: Nadara Mustard, MD;  Location: [facility] OR;  Service: Orthopedics;  Laterality: Left;   PENILE PROSTHESIS IMPLANT N/A 03/03/2021   Procedure: PENILE PROTHESIS INFLATABLE COLOPLAST;  Surgeon: Crista Elliot, MD;  Location: [facility];  Service: Urology;  Laterality: N/A;  REQUESTING 2 HRS   RADIAL ARTERY HARVEST Left 04/03/2020   Procedure: LEFT RADIAL ARTERY HARVEST;  Surgeon: Linden Dolin, MD;  Location: [facility] OR;  Service: Open Heart Surgery;  Laterality: Left;   TEE WITHOUT CARDIOVERSION N/A 04/03/2020   Procedure: TRANSESOPHAGEAL ECHOCARDIOGRAM (TEE);  Surgeon: Linden Dolin, MD;  Location: [facility] OR;  Service: Open Heart Surgery;  Laterality: N/A;   UPPER EXTREMITY  ANGIOGRAPHY  03/29/2020   Procedure: Upper Extremity Angiography;  Surgeon: Lyn Records, MD;  Location: [facility] INVASIVE CV LAB;  Service: Cardiovascular;;  rt.  arm   Social History   Occupational History   Occupation: retired d/t medical conditions  Tobacco Use   Smoking status: Every Day    Current packs/day: 1.00    Average packs/day: 1 pack/day for 0.5 years (0.5 ttl pk-yrs)    Types: Cigarettes    Passive exposure: Current   Smokeless tobacco: Never  Vaping Use   Vaping status: Never Used  Substance and Sexual Activity   Alcohol use: Not Currently    Alcohol/week: 1.0 standard drink of alcohol    Types: 1 Cans of beer per week    Comment: occassionally beer   Drug use: Not Currently  Types: Marijuana    Comment: marijuana last use 3 xweek last used 02-23-2021   Sexual activity: Not Currently

## 2023-03-22 ENCOUNTER — Other Ambulatory Visit: Payer: Self-pay | Admitting: Physician Assistant

## 2023-03-25 ENCOUNTER — Other Ambulatory Visit: Payer: Self-pay

## 2023-03-25 MED ORDER — LOSARTAN POTASSIUM 25 MG PO TABS: 12.5000 mg | ORAL_TABLET | Freq: Every day | ORAL | 1 refills | Status: DC

## 2023-03-30 ENCOUNTER — Other Ambulatory Visit: Payer: Self-pay | Admitting: Nurse Practitioner

## 2023-03-30 ENCOUNTER — Ambulatory Visit (INDEPENDENT_AMBULATORY_CARE_PROVIDER_SITE_OTHER): Payer: 59 | Admitting: Family

## 2023-03-30 ENCOUNTER — Encounter: Payer: Self-pay | Admitting: Family

## 2023-03-30 DIAGNOSIS — L02612 Cutaneous abscess of left foot: Secondary | ICD-10-CM

## 2023-03-30 NOTE — Progress Notes (Signed)
 Office Visit Note   Patient: Curtis Watkins           Date of Birth: 26-Jan-1956           MRN: [id number] Visit Date: 03/30/2023              Requested by: Lorren Greig PARAS, NP [address] [place],  [place] [address] PCP: Lorren Greig PARAS, NP  Chief Complaint  Patient presents with   Left Foot - Routine Post Op    02/03/2023 left foot excision medial cuneiform      HPI: The patient is a 68 year old gentleman seen status post left foot excision of the medial cuneiform December 11.  He received donation Kerecis micro powder at his last visit  He has no new concerns  Assessment & Plan: Visit Diagnoses: No diagnosis found.  Plan: Silver cell dressing changes daily may advance weightbearing as he tolerates  Follow-Up Instructions: Return in about 4 weeks (around 04/27/2023).   Ortho Exam  Patient is alert, oriented, no adenopathy, well-dressed, normal affect, normal respiratory effort. On examination of the left foot the remaining open area is now 8 mm x 1 mm without any depth there is no active drainage no surrounding erythema no cellulitis  Imaging: No results found.   Labs: Lab Results  Component Value Date   HGBA1C 7.6 (H) 01/30/2023   HGBA1C 7.6 (A) 07/28/2022   HGBA1C 6.9 (A) 11/28/2021   REPTSTATUS 02/10/2023 FINAL 02/03/2023   GRAMSTAIN  02/03/2023    RARE WBC PRESENT,BOTH PMN AND MONONUCLEAR NO ORGANISMS SEEN    CULT  02/03/2023    No growth aerobically or anaerobically. Performed at [facility], [address], [place], [place] [address]    [facility] STAPHYLOCOCCUS EPIDERMIDIS 07/01/2022     Lab Results  Component Value Date   ALBUMIN  2.8 (L) 01/30/2023   ALBUMIN  4.0 08/02/2020    Lab Results  Component Value Date   MG 2.1 02/04/2023   MG 2.1 02/03/2023   MG 2.2 02/02/2023   No results found for: VD25OH  No results found for: PREALBUMIN    Latest Ref Rng & Units 02/04/2023    5:44 AM 02/03/2023    6:07 AM 02/02/2023     3:16 AM  CBC EXTENDED  WBC 4.0 - 10.5 K/uL 8.3  6.2  7.3   RBC 4.22 - 5.81 MIL/uL 3.81  4.18  4.29   Hemoglobin 13.0 - 17.0 g/dL 88.0  86.7  86.5   HCT 39.0 - 52.0 % 35.2  38.4  39.5   Platelets 150 - 400 K/uL 183  193  180      There is no height or weight on file to calculate BMI.  Orders:  No orders of the defined types were placed in this encounter.  No orders of the defined types were placed in this encounter.    Procedures: No procedures performed  Clinical Data: No additional findings.  ROS:  All other systems negative, except as noted in the HPI. Review of Systems  Objective: Vital Signs: There were no vitals taken for this visit.  Specialty Comments:  No specialty comments available.  PMFS History: Patient Active Problem List   Diagnosis Date Noted   Osteomyelitis of foot, left, acute (HCC) 01/30/2023   Sepsis due to cellulitis (HCC) 01/30/2023   Osteomyelitis of great toe of left foot (HCC) 09/04/2022   Cutaneous abscess of left foot 07/01/2022   Foreign body in left foot 06/23/2022  Influenza 03/06/2022   Elevated troponin 03/06/2022   Generalized weakness 03/05/2022   Trichomonas infection 07/31/2021   Erectile dysfunction 03/03/2021   HFrEF (heart failure with reduced ejection fraction) (HCC) 08/21/2020   Acute on chronic systolic and diastolic heart failure, NYHA class 3 (HCC) 05/25/2020   Pleural effusion    Gangrene of toe of right foot (HCC)    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   Coronary artery disease involving native coronary artery of native heart with unstable angina pectoris (HCC)    Acute congestive heart failure (HCC) 03/27/2020   Diabetic foot ulcers (HCC) 03/27/2020   DM2 (diabetes mellitus, type 2) (HCC) 03/27/2020   HTN (hypertension) 03/27/2020   Past Medical History:  Diagnosis Date   Blind right eye    Carotid artery disease (HCC)    04/01/20: 60-79% RICA stenosis, 1-39% LICA stenosis   Chronic kidney disease    ckd stage 3 a per  dr hobart ronco 02-05-2021 epic   Coronary artery disease    NSTEMI s/p CABG in 03/2020 (LIMA-->LAD/diag, L radial-->OM1/OM3); b.   DM type 2    Glaucoma of right eye, unspecified glaucoma type    HFrEF (heart failure with reduced ejection fraction) (HCC)    Echo 6/22: EF 45, global HK, mild MR, RVSP 31.9   Hypertension    Ischemic cardiomyopathy    PAD (peripheral artery disease) (HCC)    occluded right axillary artery 03/29/20     Family History  Problem Relation Age of Onset   Diabetes Mother     Past Surgical History:  Procedure Laterality Date   AMPUTATION Right 04/24/2020   Procedure: RIGHT 2ND AND 3RD TOE AMPUTATION;  Surgeon: Harden Jerona GAILS, MD;  Location: [facility] OR;  Service: Orthopedics;  Laterality: Right;   AMPUTATION Right 10/22/2021   Procedure: RIGHT TRANSMETATARSAL AMPUTATION;  Surgeon: Harden Jerona GAILS, MD;  Location: [facility] OR;  Service: Orthopedics;  Laterality: Right;   AMPUTATION Left 09/04/2022   Procedure: LEFT 1ST RAY AMPUTATION;  Surgeon: Harden Jerona GAILS, MD;  Location: [facility] OR;  Service: Orthopedics;  Laterality: Left;   APPLICATION OF WOUND VAC Right 10/22/2021   Procedure: APPLICATION OF WOUND VAC;  Surgeon: Harden Jerona GAILS, MD;  Location: [facility] OR;  Service: Orthopedics;  Laterality: Right;   CENTRAL VENOUS CATHETER INSERTION Left 04/03/2020   Procedure: INSERTION OF FEMORAL ARTERIAL LINE;  Surgeon: German Bartlett PEDLAR, MD;  Location: [facility] OR;  Service: Open Heart Surgery;  Laterality: Left;   CORONARY ARTERY BYPASS GRAFT N/A 04/03/2020   Procedure: CORONARY ARTERY BYPASS GRAFTING (CABG) TIMES FOUR USING LEFT INTERNAL MAMMARY ARTERY AND LEFT RADIAL ARTERY.;  Surgeon: German Bartlett PEDLAR, MD;  Location: [facility] OR;  Service: Open Heart Surgery;  Laterality: N/A;   CORONARY PRESSURE/FFR WITH 3D MAPPING N/A 03/29/2020   Procedure: Coronary Pressure Wire/FFR w/3D Mapping;  Surgeon: Claudene Victory ORN, MD;  [facility] INVASIVE CV LAB;  Service: Cardiovascular;  Laterality: N/A;   EYE SURGERY  Right 01/21/2021   right eye cataract lens replacement surgery   I & D EXTREMITY Left 07/01/2022   Procedure: LEFT FOOT DEBRIDEMENT AND FOREIGN BODY REMOVAL;  Surgeon: Harden Jerona GAILS, MD;  Location: [facility] OR;  Service: Orthopedics;  Laterality: Left;   IR THORACENTESIS ASP PLEURAL SPACE W/IMG GUIDE  05/03/2020   IR THORACENTESIS ASP PLEURAL SPACE W/IMG GUIDE  05/16/2020   LEFT HEART CATH AND CORONARY ANGIOGRAPHY N/A 03/29/2020   Procedure: LEFT HEART CATH AND CORONARY ANGIOGRAPHY;  Surgeon: Claudene Victory ORN, MD;  [facility] LAB;  Service: Cardiovascular;  Laterality: N/A;   METATARSAL HEAD EXCISION Left 02/03/2023   Procedure: LEFT FOOT EXCISION MEDIAL CUNEIFORM;  Surgeon: Harden Jerona GAILS, MD;  Location[facility];  Service: Orthopedics;  Laterality: Left;   PENILE PROSTHESIS IMPLANT N/A 03/03/2021   Procedure: PENILE PROTHESIS INFLATABLE COLOPLAST;  Surgeon: Carolee Sherwood JONETTA DOUGLAS, MD;  Location: [facility];  Service: Urology;  Laterality: N/A;  REQUESTING 2 HRS   RADIAL ARTERY HARVEST Left 04/03/2020   Procedure: LEFT RADIAL ARTERY HARVEST;  Surgeon: German Bartlett PEDLAR, MD;  [facility];  Service: Open Heart Surgery;  Laterality: Left;   TEE WITHOUT CARDIOVERSION N/A 04/03/2020   Procedure: TRANSESOPHAGEAL ECHOCARDIOGRAM (TEE);  Surgeon: German Bartlett PEDLAR, MD;  Location: [facility] OR;  Service: Open Heart Surgery;  Laterality: N/A;   UPPER EXTREMITY ANGIOGRAPHY  03/29/2020   Procedure: Upper Extremity Angiography;  Surgeon: Claudene Victory ORN, MD;  Location: [facility] INVASIVE CV LAB;  Service: Cardiovascular;;  rt.  arm   Social History   Occupational History   Occupation: retired d/t medical conditions  Tobacco Use   Smoking status: Every Day    Current packs/day: 1.00    Average packs/day: 1 pack/day for 0.5 years (0.5 ttl pk-yrs)    Types: Cigarettes    Passive exposure: Current   Smokeless tobacco: Never  Vaping Use   Vaping status: Never Used  Substance and Sexual Activity    Alcohol use: Not Currently    Alcohol/week: 1.0 standard drink of alcohol    Types: 1 Cans of beer per week    Comment: occassionally beer   Drug use: Not Currently    Types: Marijuana    Comment: marijuana last use 3 xweek last used 02-23-2021   Sexual activity: Not Currently

## 2023-03-31 ENCOUNTER — Other Ambulatory Visit: Payer: Self-pay | Admitting: Family

## 2023-04-01 MED ORDER — FUROSEMIDE 20 MG PO TABS: 20.0000 mg | ORAL_TABLET | ORAL | 1 refills | Status: DC

## 2023-04-20 DIAGNOSIS — H2511 Age-related nuclear cataract, right eye: Secondary | ICD-10-CM | POA: Diagnosis not present

## 2023-04-20 DIAGNOSIS — H40052 Ocular hypertension, left eye: Secondary | ICD-10-CM | POA: Diagnosis not present

## 2023-04-20 DIAGNOSIS — E1136 Type 2 diabetes mellitus with diabetic cataract: Secondary | ICD-10-CM | POA: Diagnosis not present

## 2023-04-20 DIAGNOSIS — Z7984 Long term (current) use of oral hypoglycemic drugs: Secondary | ICD-10-CM | POA: Diagnosis not present

## 2023-04-20 DIAGNOSIS — H5461 Unqualified visual loss, right eye, normal vision left eye: Secondary | ICD-10-CM | POA: Diagnosis not present

## 2023-04-20 DIAGNOSIS — E113212 Type 2 diabetes mellitus with mild nonproliferative diabetic retinopathy with macular edema, left eye: Secondary | ICD-10-CM | POA: Diagnosis not present

## 2023-04-27 ENCOUNTER — Encounter: Payer: Self-pay | Admitting: Family

## 2023-04-27 ENCOUNTER — Ambulatory Visit (INDEPENDENT_AMBULATORY_CARE_PROVIDER_SITE_OTHER): Payer: 59 | Admitting: Family

## 2023-04-27 DIAGNOSIS — B351 Tinea unguium: Secondary | ICD-10-CM

## 2023-04-27 DIAGNOSIS — Z89412 Acquired absence of left great toe: Secondary | ICD-10-CM

## 2023-04-27 DIAGNOSIS — L97401 Non-pressure chronic ulcer of unspecified heel and midfoot limited to breakdown of skin: Secondary | ICD-10-CM

## 2023-04-27 DIAGNOSIS — E08621 Diabetes mellitus due to underlying condition with foot ulcer: Secondary | ICD-10-CM

## 2023-04-27 NOTE — Progress Notes (Signed)
 Office Visit Note   Patient: Curtis Watkins           Date of Birth: 12-24-1955           MRN: [id number] Visit Date: 04/27/2023              Requested by: Rema Fendt, NP [address] [place],  [place] [address] PCP: Rema Fendt, NP  Chief Complaint  Patient presents with   Left Foot - Routine Post Op    02/03/2023 left foot excision medial cuneiform      HPI: The patient is a 68 year old gentleman seen status post left foot excision of the medial cuneiform December 11.  He has received donation Kerecis micro powder during his recovery.  Has most recently been using silver cell for dressing changes  He has no new concerns  Assessment & Plan: Visit Diagnoses: No diagnosis found.  Plan: Left foot has fully healed.  He will follow-up in the office as needed.  Discussed close monitoring of the foot especially the second toe which has fixed clawing.  Follow-Up Instructions: No follow-ups on file.   Ortho Exam  Patient is alert, oriented, no adenopathy, well-dressed, normal affect, normal respiratory effort. On examination of the left foot the incision is now fully healed there is no erythema no drainage no open area.  He does have fixed clawing of the second toe.  There are thickened and discolored onychomycotic nails x 4.  He is unable to safely trim his nails these were trimmed today. Imaging: No results found.   Labs: Lab Results  Component Value Date   HGBA1C 7.6 (H) 01/30/2023   HGBA1C 7.6 (A) 07/28/2022   HGBA1C 6.9 (A) 11/28/2021   REPTSTATUS 02/10/2023 FINAL 02/03/2023   GRAMSTAIN  02/03/2023    RARE WBC PRESENT,BOTH PMN AND MONONUCLEAR NO ORGANISMS SEEN    CULT  02/03/2023    No growth aerobically or anaerobically. Performed at [facility], [address], [place], [place] [address]    [facility] STAPHYLOCOCCUS EPIDERMIDIS 07/01/2022     Lab Results  Component Value Date   ALBUMIN 2.8 (L) 01/30/2023   ALBUMIN 4.0 08/02/2020     Lab Results  Component Value Date   MG 2.1 02/04/2023   MG 2.1 02/03/2023   MG 2.2 02/02/2023   No results found for: "VD25OH"  No results found for: "PREALBUMIN"    Latest Ref Rng & Units 02/04/2023    5:44 AM 02/03/2023    6:07 AM 02/02/2023    3:16 AM  CBC EXTENDED  WBC 4.0 - 10.5 K/uL 8.3  6.2  7.3   RBC 4.22 - 5.81 MIL/uL 3.81  4.18  4.29   Hemoglobin 13.0 - 17.0 g/dL 78.4  69.6  29.5   HCT 39.0 - 52.0 % 35.2  38.4  39.5   Platelets 150 - 400 K/uL 183  193  180      There is no height or weight on file to calculate BMI.  Orders:  No orders of the defined types were placed in this encounter.  No orders of the defined types were placed in this encounter.    Procedures: No procedures performed  Clinical Data: No additional findings.  ROS:  All other systems negative, except as noted in the HPI. Review of Systems  Objective: Vital Signs: There were no vitals taken for this visit.  Specialty Comments:  No specialty comments available.  PMFS History: Patient Active Problem List  Diagnosis Date Noted   Osteomyelitis of foot, left, acute (HCC) 01/30/2023   Sepsis due to cellulitis (HCC) 01/30/2023   Osteomyelitis of great toe of left foot (HCC) 09/04/2022   Cutaneous abscess of left foot 07/01/2022   Foreign body in left foot 06/23/2022   Influenza 03/06/2022   Elevated troponin 03/06/2022   Generalized weakness 03/05/2022   Trichomonas infection 07/31/2021   Erectile dysfunction 03/03/2021   HFrEF (heart failure with reduced ejection fraction) (HCC) 08/21/2020   Acute on chronic systolic and diastolic heart failure, NYHA class 3 (HCC) 05/25/2020   Pleural effusion    Gangrene of toe of right foot (HCC)    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   Coronary artery disease involving native coronary artery of native heart with unstable angina pectoris (HCC)    Acute congestive heart failure (HCC) 03/27/2020   Diabetic foot ulcers (HCC) 03/27/2020   DM2 (diabetes  mellitus, type 2) (HCC) 03/27/2020   HTN (hypertension) 03/27/2020   Past Medical History:  Diagnosis Date   Blind right eye    Carotid artery disease (HCC)    04/01/20: 60-79% RICA stenosis, 1-39% LICA stenosis   Chronic kidney disease    ckd stage 3 a per dr Ronald Lobo 02-05-2021 epic   Coronary artery disease    NSTEMI s/p CABG in 03/2020 (LIMA-->LAD/diag, L radial-->OM1/OM3); b.   DM type 2    Glaucoma of right eye, unspecified glaucoma type    HFrEF (heart failure with reduced ejection fraction) (HCC)    Echo 6/22: EF 45, global HK, mild MR, RVSP 31.9   Hypertension    Ischemic cardiomyopathy    PAD (peripheral artery disease) (HCC)    occluded right axillary artery 03/29/20     Family History  Problem Relation Age of Onset   Diabetes Mother     Past Surgical History:  Procedure Laterality Date   AMPUTATION Right 04/24/2020   Procedure: RIGHT 2ND AND 3RD TOE AMPUTATION;  Surgeon: Nadara Mustard, MD;  [facility];  Service: Orthopedics;  Laterality: Right;   AMPUTATION Right 10/22/2021   Procedure: RIGHT TRANSMETATARSAL AMPUTATION;  Surgeon: Nadara Mustard, MD;  Location: [facility] OR;  Service: Orthopedics;  Laterality: Right;   AMPUTATION Left 09/04/2022   Procedure: LEFT 1ST RAY AMPUTATION;  Surgeon: Nadara Mustard, MD;  Location: [facility] OR;  Service: Orthopedics;  Laterality: Left;   APPLICATION OF WOUND VAC Right 10/22/2021   Procedure: APPLICATION OF WOUND VAC;  Surgeon: Nadara Mustard, MD;  [facility] OR;  Service: Orthopedics;  Laterality: Right;   CENTRAL VENOUS CATHETER INSERTION Left 04/03/2020   Procedure: INSERTION OF FEMORAL ARTERIAL LINE;  Surgeon: Linden Dolin, MD;  Location: [facility] OR;  Service: Open Heart Surgery;  Laterality: Left;   CORONARY ARTERY BYPASS GRAFT N/A 04/03/2020   Procedure: CORONARY ARTERY BYPASS GRAFTING (CABG) TIMES FOUR USING LEFT INTERNAL MAMMARY ARTERY AND LEFT RADIAL ARTERY.;  Surgeon: Linden Dolin, MD;  Location: [facility] OR;  Service: Open  Heart Surgery;  Laterality: N/A;   CORONARY PRESSURE/FFR WITH 3D MAPPING N/A 03/29/2020   Procedure: Coronary Pressure Wire/FFR w/3D Mapping;  Surgeon: Lyn Records, MD;  [facility] LAB;  Service: Cardiovascular;  Laterality: N/A;   EYE SURGERY Right 01/21/2021   right eye cataract lens replacement surgery   I & D EXTREMITY Left 07/01/2022   Procedure: LEFT FOOT DEBRIDEMENT AND FOREIGN BODY REMOVAL;  Surgeon: Nadara Mustard, MD;  Location: [facility] OR;  Service: Orthopedics;  Laterality:  Left;   IR THORACENTESIS ASP PLEURAL SPACE W/IMG GUIDE  05/03/2020   IR THORACENTESIS ASP PLEURAL SPACE W/IMG GUIDE  05/16/2020   LEFT HEART CATH AND CORONARY ANGIOGRAPHY N/A 03/29/2020   Procedure: LEFT HEART CATH AND CORONARY ANGIOGRAPHY;  Surgeon: Lyn Records, MD;  [facility]: [facility] LAB;  Service: Cardiovascular;  Laterality: N/A;   METATARSAL HEAD EXCISION Left 02/03/2023   Procedure: LEFT FOOT EXCISION MEDIAL CUNEIFORM;  Surgeon: Nadara Mustard, MD;  Location: [facility] OR;  Service: Orthopedics;  Laterality: Left;   PENILE PROSTHESIS IMPLANT N/A 03/03/2021   Procedure: PENILE PROTHESIS INFLATABLE COLOPLAST;  Surgeon: Crista Elliot, MD;  Location: [facility];  Service: Urology;  Laterality: N/A;  REQUESTING 2 HRS   RADIAL ARTERY HARVEST Left 04/03/2020   Procedure: LEFT RADIAL ARTERY HARVEST;  Surgeon: Linden Dolin, MD;  Location: [facility] OR;  Service: Open Heart Surgery;  Laterality: Left;   TEE WITHOUT CARDIOVERSION N/A 04/03/2020   Procedure: TRANSESOPHAGEAL ECHOCARDIOGRAM (TEE);  Surgeon: Linden Dolin, MD;  Location: [facility] OR;  Service: Open Heart Surgery;  Laterality: N/A;   UPPER EXTREMITY ANGIOGRAPHY  03/29/2020   Procedure: Upper Extremity Angiography;  Surgeon: Lyn Records, MD;  Location: [facility] INVASIVE CV LAB;  Service: Cardiovascular;;  rt.  arm   Social History   Occupational History   Occupation: retired d/t medical conditions  Tobacco Use   Smoking status:  Every Day    Current packs/day: 1.00    Average packs/day: 1 pack/day for 0.5 years (0.5 ttl pk-yrs)    Types: Cigarettes    Passive exposure: Current   Smokeless tobacco: Never  Vaping Use   Vaping status: Never Used  Substance and Sexual Activity   Alcohol use: Not Currently    Alcohol/week: 1.0 standard drink of alcohol    Types: 1 Cans of beer per week    Comment: occassionally beer   Drug use: Not Currently    Types: Marijuana    Comment: marijuana last use 3 xweek last used 02-23-2021   Sexual activity: Not Currently

## 2023-06-03 ENCOUNTER — Ambulatory Visit: Payer: 59

## 2023-06-03 VITALS — BP 122/71 | Ht 69.0 in | Wt 160.0 lb

## 2023-06-03 DIAGNOSIS — Z1211 Encounter for screening for malignant neoplasm of colon: Secondary | ICD-10-CM | POA: Diagnosis not present

## 2023-06-03 DIAGNOSIS — Z0000 Encounter for general adult medical examination without abnormal findings: Secondary | ICD-10-CM

## 2023-06-03 DIAGNOSIS — Z2821 Immunization not carried out because of patient refusal: Secondary | ICD-10-CM

## 2023-06-03 NOTE — Progress Notes (Signed)
 Because this visit was a virtual/telehealth visit,  certain criteria was not obtained, such a blood pressure, CBG if applicable, and timed get up and go. Any medications not marked as "taking" were not mentioned during the medication reconciliation part of the visit. Any vitals not documented were not able to be obtained due to this being a telehealth visit or patient was unable to self-report a recent blood pressure reading due to a lack of equipment at home via telehealth. Vitals that have been documented are verbally provided by the patient.   Subjective:   Curtis Watkins is a 68 y.o. who presents for a Medicare Wellness preventive visit.  Visit Complete: Virtual I connected with  [name] on 06/03/23 by a audio enabled telemedicine application and verified that I am speaking with the correct person using two identifiers.  Patient Location: Home  Provider Location: Home Office  I discussed the limitations of evaluation and management by telemedicine. The patient expressed understanding and agreed to proceed.  Vital Signs: Because this visit was a virtual/telehealth visit, some criteria may be missing or patient reported. Any vitals not documented were not able to be obtained and vitals that have been documented are patient reported.  VideoDeclined- This patient declined Librarian, academic. Therefore the visit was completed with audio only.  Persons Participating in Visit: Patient.  AWV Questionnaire: No: Patient Medicare AWV questionnaire was not completed prior to this visit.  Cardiac Risk Factors include: advanced age (>33men, >52 women);male gender;diabetes mellitus;hypertension     Objective:    Today's Vitals   06/03/23 1131  BP: 122/71  Weight: 160 lb (72.6 kg)  Height: 5\' 9"  (1.753 m)   Body mass index is 23.63 kg/m.     06/03/2023   11:40 AM 02/03/2023    2:24 PM 01/30/2023    4:17 PM 09/04/2022    7:02 AM 07/01/2022    7:21 AM 04/20/2022    10:26 AM 03/05/2022    6:44 PM  Advanced Directives  Does Patient Have a Medical Advance Directive? No No No No No No No  Would patient like information on creating a medical advance directive? No - Patient declined No - Patient declined No - Patient declined  No - Patient declined Yes (Inpatient - patient defers creating a medical advance directive at this time - Information given)     Current Medications (verified) Outpatient Encounter Medications as of 06/03/2023  Medication Sig   aspirin 325 MG tablet Take 162 mg by mouth in the morning.   atorvastatin (LIPITOR) 80 MG tablet Take 1 tablet (80 mg total) by mouth daily. Pt needs to schedule appt with provider for further refills   blood glucose meter kit and supplies by Other route as directed. Dispense based on patient and insurance preference. Use up to 2x times daily as directed. (FOR ICD-10 E10.9, E11.9).   brimonidine (ALPHAGAN) 0.2 % ophthalmic solution Place 1 drop into the left eye 2 (two) times daily.   dorzolamide-timolol (COSOPT) 2-0.5 % ophthalmic solution Place 1 drop into the left eye 2 (two) times daily.   empagliflozin (JARDIANCE) 25 MG TABS tablet Take 1 tablet (25 mg total) by mouth daily before breakfast.   furosemide (LASIX) 20 MG tablet Take 1 tablet (20 mg total) by mouth 3 (three) times a week. Take on Mondays, Wednesdays, and Fridays.   glimepiride (AMARYL) 1 MG tablet Take 1 tablet (1 mg total) by mouth daily before breakfast.   losartan (COZAAR) 25 MG tablet  Take 1/2 tablet (12.5 mg total) by mouth daily.   metoprolol succinate (TOPROL-XL) 25 MG 24 hr tablet Take 0.5 tablets (12.5 mg total) by mouth daily. Pt needs to keep upcoming appt in May for a year's supply   pentoxifylline (TRENTAL) 400 MG CR tablet TAKE 1 TABLET BY MOUTH 3 TIMES DAILY WITH MEALS.   spironolactone (ALDACTONE) 25 MG tablet TAKE ONE-HALF TABLET BY MOUTH  DAILY   No facility-administered encounter medications on file as of 06/03/2023.     Allergies (verified) Vancomycin   History: Past Medical History:  Diagnosis Date   Blind right eye    Carotid artery disease (HCC)    04/01/20: 60-79% RICA stenosis, 1-39% LICA stenosis   Chronic kidney disease    ckd stage 3 a per dr Ronald Lobo 02-05-2021 epic   Coronary artery disease    NSTEMI s/p CABG in 03/2020 (LIMA-->LAD/diag, L radial-->OM1/OM3); b.   DM type 2    Glaucoma of right eye, unspecified glaucoma type    HFrEF (heart failure with reduced ejection fraction) (HCC)    Echo 6/22: EF 45, global HK, mild MR, RVSP 31.9   Hypertension    Ischemic cardiomyopathy    PAD (peripheral artery disease) (HCC)    occluded right axillary artery 03/29/20    Past Surgical History:  Procedure Laterality Date   AMPUTATION Right 04/24/2020   Procedure: RIGHT 2ND AND 3RD TOE AMPUTATION;  Surgeon: Nadara Mustard, MD;  Location: [facility] OR;  Service: Orthopedics;  Laterality: Right;   AMPUTATION Right 10/22/2021   Procedure: RIGHT TRANSMETATARSAL AMPUTATION;  Surgeon: Nadara Mustard, MD;  Location: [facility] OR;  Service: Orthopedics;  Laterality: Right;   AMPUTATION Left 09/04/2022   Procedure: LEFT 1ST RAY AMPUTATION;  Surgeon: Nadara Mustard, MD;  Location: [facility] OR;  Service: Orthopedics;  Laterality: Left;   APPLICATION OF WOUND VAC Right 10/22/2021   Procedure: APPLICATION OF WOUND VAC;  Surgeon: Nadara Mustard, MD;  Location: [facility] OR;  Service: Orthopedics;  Laterality: Right;   CENTRAL VENOUS CATHETER INSERTION Left 04/03/2020   Procedure: INSERTION OF FEMORAL ARTERIAL LINE;  Surgeon: Linden Dolin, MD;  Location: [facility] OR;  Service: Open Heart Surgery;  Laterality: Left;   CORONARY ARTERY BYPASS GRAFT N/A 04/03/2020   Procedure: CORONARY ARTERY BYPASS GRAFTING (CABG) TIMES FOUR USING LEFT INTERNAL MAMMARY ARTERY AND LEFT RADIAL ARTERY.;  Surgeon: Linden Dolin, MD;  Location: [facility] OR;  Service: Open Heart Surgery;  Laterality: N/A;   CORONARY PRESSURE/FFR WITH 3D MAPPING N/A 03/29/2020    Procedure: Coronary Pressure Wire/FFR w/3D Mapping;  Surgeon: Lyn Records, MD;  Location: [facility] INVASIVE CV LAB;  Service: Cardiovascular;  Laterality: N/A;   EYE SURGERY Right 01/21/2021   right eye cataract lens replacement surgery   I & D EXTREMITY Left 07/01/2022   Procedure: LEFT FOOT DEBRIDEMENT AND FOREIGN BODY REMOVAL;  Surgeon: Nadara Mustard, MD;  Location: [facility] OR;  Service: Orthopedics;  Laterality: Left;   IR THORACENTESIS ASP PLEURAL SPACE W/IMG GUIDE  05/03/2020   IR THORACENTESIS ASP PLEURAL SPACE W/IMG GUIDE  05/16/2020   LEFT HEART CATH AND CORONARY ANGIOGRAPHY N/A 03/29/2020   Procedure: LEFT HEART CATH AND CORONARY ANGIOGRAPHY;  Surgeon: Lyn Records, MD;  [facility]: [facility] INV[facility];  Service: Cardiovascular;  Laterality: N/A;   METATARSAL HEAD EXCISION Left 02/03/2023   Procedure: LEFT FOOT EXCISION MEDIAL CUNEIFORM;  Surgeon: Nadara Mustard, MD;  Location: [facility] OR;  Service: Orthopedics;  Laterality: Left;  PENILE PROSTHESIS IMPLANT N/A 03/03/2021   Procedure: PENILE PROTHESIS INFLATABLE COLOPLAST;  Surgeon: Crista Elliot, MD;  Location: [facility];  Service: Urology;  Laterality: N/A;  REQUESTING 2 HRS   RADIAL ARTERY HARVEST Left 04/03/2020   Procedure: LEFT RADIAL ARTERY HARVEST;  Surgeon: Linden Dolin, MD;  Location: [facility] OR;  Service: Open Heart Surgery;  Laterality: Left;   TEE WITHOUT CARDIOVERSION N/A 04/03/2020   Procedure: TRANSESOPHAGEAL ECHOCARDIOGRAM (TEE);  Surgeon: Linden Dolin, MD;  Location: [facility] OR;  Service: Open Heart Surgery;  Laterality: N/A;   UPPER EXTREMITY ANGIOGRAPHY  03/29/2020   Procedure: Upper Extremity Angiography;  Surgeon: Lyn Records, MD;  Location: [facility] INVASIVE CV LAB;  Service: Cardiovascular;;  rt.  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/t medical conditions  Tobacco Use   Smoking status: Every Day    Current packs/day: 1.00    Average packs/day: 1 pack/day for 0.5 years (0.5 ttl pk-yrs)    Types: Cigarettes    Passive exposure: Current   Smokeless tobacco: Never  Vaping Use   Vaping status: Never Used  Substance and Sexual Activity   Alcohol use: Not Currently    Alcohol/week: 1.0 standard drink of alcohol    Types: 1 Cans of beer per week    Comment: occassionally beer   Drug use: Not Currently    Types: Marijuana    Comment: marijuana last use 3 xweek last used 02-23-2021   Sexual activity: Not Currently  Other Topics Concern   Not on file  Social History Narrative   Not on file   Social Drivers of Health   Financial Resource Strain: Low Risk  (06/03/2023)   Overall Financial Resource Strain (CARDIA)    Difficulty of Paying Living Expenses: Not very hard  Food Insecurity: No Food Insecurity (06/03/2023)   Hunger Vital Sign    Worried About Running Out of Food in the Last Year: Never true    Ran Out of Food in the Last Year: Never true  Transportation Needs: No Transportation Needs (06/03/2023)   PRAPARE - Administrator, Civil Service (Medical): No    Lack of Transportation (Non-Medical): No  Physical Activity: Inactive (06/03/2023)   Exercise Vital Sign    Days of Exercise per Week: 0 days    Minutes of Exercise per Session: 0 min  Stress: No Stress Concern Present (06/03/2023)   [employer] of Occupational Health - Occupational Stress Questionnaire    Feeling of Stress : Not at all  Social Connections: Moderately Isolated (06/03/2023)   Social Connection and Isolation Panel [NHANES]    Frequency of Communication with Friends and Family: More than three times a week    Frequency of Social Gatherings with Friends and Family: More than three times a week    Attends  Religious Services: Never    Database administrator or Organizations: No    Attends Banker Meetings: Never    Marital Status: Married    Tobacco Counseling Ready to quit: Not Answered Counseling given: Not Answered    Clinical Intake:  Pre-visit preparation completed: Yes  Pain : No/denies pain     BMI - recorded: 23.63 Nutritional Status: BMI 25 -29 Overweight Nutritional Risks: None Diabetes: Yes CBG done?: No Did pt. bring in CBG monitor from home?: No  Lab Results  Component Value Date   HGBA1C 7.6 (H) 01/30/2023   HGBA1C 7.6 (A) 07/28/2022   HGBA1C 6.9 (A) 11/28/2021     How often do you need to have someone help you when you read instructions, pamphlets, or other written materials from your doctor or pharmacy?: 1 - Never What is the last grade level you completed in school?: 12th grade     Information entered by :: [employer]   Activities of Daily Living     06/03/2023   11:39 AM 02/01/2023    8:20 PM  In your present state of health, do you have any difficulty performing the following activities:  Hearing? 0 0  Vision? 0 0  Difficulty concentrating or making decisions? 0 0  Walking or climbing stairs? 0   Dressing or bathing? 0   Doing errands, shopping? 0   Preparing Food and eating ? N   Using the Toilet? N   In the past six months, have you accidently leaked urine? N   Do you have problems with loss of bowel control? N   Managing your Medications? N   Managing your Finances? N   Housekeeping or managing your Housekeeping? N     Patient Care Team: Rema Fendt, NP as PCP - General (Nurse Practitioner) Meriam Sprague, MD (Inactive) as PCP - Cardiology (Cardiology) Kennon Rounds as Physician Assistant (Cardiology) Pccm, Md, MD (Pulmonary Disease)  Indicate any recent Medical Services you may have received from other than [facility] providers in the past year (date may be approximate).     Assessment:    This is a routine wellness examination for [place].  Hearing/Vision screen Hearing Screening - Comments:: Patient declined Vision Screening - Comments:: Patient states vision is fine   Goals Addressed             This Visit's Progress    Patient Stated       TO KEEP UP WITH HIS HEALTH       Depression Screen     06/03/2023   11:41 AM 05/15/2022    1:54 PM 04/20/2022   10:27 AM 11/28/2021    2:22 PM 08/02/2020    8:58 AM 07/05/2020   11:51 AM  PHQ 2/9 Scores  PHQ - 2 Score 0 0 0 0 0 0  PHQ- 9 Score 0    0 0    Fall Risk     06/03/2023   11:40 AM 04/20/2022   10:27 AM 07/30/2021    2:28 PM 07/05/2020   11:51 AM  Fall Risk   Falls in the past year? 0 0 0 0  Number falls in past yr: 0 0 0 0  Injury with Fall? 0 0 0 0  Risk for fall due to : No Fall Risks No Fall Risks No Fall Risks No Fall Risks  Follow up Falls prevention discussed;Falls evaluation completed Falls evaluation completed Falls evaluation completed Falls evaluation completed    MEDICARE RISK AT HOME:  Medicare Risk at Home Any stairs in or around the home?: Yes If so, are there any without handrails?: No Home free of loose throw rugs in walkways, pet beds, electrical cords, etc?: Yes Adequate lighting in your home to reduce risk of falls?: Yes Life alert?: No  Use of a cane, walker or w/c?: No Grab bars in the bathroom?: No Shower chair or bench in shower?: No Elevated toilet seat or a handicapped toilet?: No  TIMED UP AND GO:  Was the test performed?  No  Cognitive Function: 6CIT completed        06/03/2023   11:34 AM 04/20/2022   10:28 AM  6CIT Screen  What Year? 0 points 0 points  What month? 0 points 0 points  What time? 0 points 0 points  Count back from 20 0 points 0 points  Months in reverse 0 points 0 points  Repeat phrase 0 points 0 points  Total Score 0 points 0 points    Immunizations Immunization History  Administered Date(s) Administered   PFIZER(Purple Top)SARS-COV-2 Vaccination  05/10/2019, 06/05/2019, 02/01/2020   Tdap 10/13/2018    Screening Tests Health Maintenance  Topic Date Due   Pneumonia Vaccine 66+ Years old (1 of 2 - PCV) Never done   Colonoscopy  Never done   Zoster Vaccines- Shingrix (1 of 2) Never done   COVID-19 Vaccine (4 - 2024-25 season) 10/25/2022   Diabetic kidney evaluation - Urine ACR  07/07/2023   FOOT EXAM  07/28/2023   HEMOGLOBIN A1C  07/31/2023   INFLUENZA VACCINE  09/24/2023   OPHTHALMOLOGY EXAM  12/18/2023   Diabetic kidney evaluation - eGFR measurement  02/04/2024   Medicare Annual Wellness (AWV)  06/02/2024   DTaP/Tdap/Td (2 - Td or Tdap) 10/12/2028   Hepatitis C Screening  Completed   HPV VACCINES  Aged Out   Meningococcal B Vaccine  Aged Out    Health Maintenance  Health Maintenance Due  Topic Date Due   Pneumonia Vaccine 72+ Years old (1 of 2 - PCV) Never done   Colonoscopy  Never done   Zoster Vaccines- Shingrix (1 of 2) Never done   COVID-19 Vaccine (4 - 2024-25 season) 10/25/2022   Health Maintenance Items Addressed: Referral sent to GI for colonoscopy , patient was advised of the vaccinations due and declined.  Additional Screening:  Vision Screening: Recommended annual ophthalmology exams for early detection of glaucoma and other disorders of the eye.  Dental Screening: Recommended annual dental exams for proper oral hygiene  Community Resource Referral / Chronic Care Management: CRR required this visit?  No   CCM required this visit?  No     Plan:     I have personally reviewed and noted the following in the patient's chart:   Medical and social history Use of alcohol, tobacco or illicit drugs  Current medications and supplements including opioid prescriptions. Patient is not currently taking opioid prescriptions. Functional ability and status Nutritional status Physical activity Advanced directives List of other physicians Hospitalizations, surgeries, and ER visits in previous 12  months Vitals Screenings to include cognitive, depression, and falls Referrals and appointments  In addition, I have reviewed and discussed with patient certain preventive protocols, quality metrics, and best practice recommendations. A written personalized care plan for preventive services as well as general preventive health recommendations were provided to patient.     Rudi Heap, [place]   06/03/2023   After Visit Summary: (MyChart) Due to this being a telephonic visit, the after visit summary with patients personalized plan was offered to patient via MyChart   Notes: Nothing significant to report at this time.

## 2023-06-08 ENCOUNTER — Other Ambulatory Visit: Payer: Self-pay | Admitting: Nurse Practitioner

## 2023-07-15 ENCOUNTER — Other Ambulatory Visit (HOSPITAL_COMMUNITY): Payer: Self-pay

## 2023-08-03 ENCOUNTER — Ambulatory Visit (HOSPITAL_COMMUNITY): Admission: RE | Admit: 2023-08-03 | Payer: 59 | Source: Ambulatory Visit

## 2023-08-05 ENCOUNTER — Other Ambulatory Visit: Payer: Self-pay | Admitting: Nurse Practitioner

## 2023-08-05 DIAGNOSIS — I5020 Unspecified systolic (congestive) heart failure: Secondary | ICD-10-CM

## 2023-08-05 DIAGNOSIS — N1831 Chronic kidney disease, stage 3a: Secondary | ICD-10-CM

## 2023-08-05 DIAGNOSIS — Z720 Tobacco use: Secondary | ICD-10-CM

## 2023-08-05 DIAGNOSIS — I779 Disorder of arteries and arterioles, unspecified: Secondary | ICD-10-CM

## 2023-08-05 DIAGNOSIS — I10 Essential (primary) hypertension: Secondary | ICD-10-CM

## 2023-08-05 DIAGNOSIS — E1100 Type 2 diabetes mellitus with hyperosmolarity without nonketotic hyperglycemic-hyperosmolar coma (NKHHC): Secondary | ICD-10-CM

## 2023-08-05 DIAGNOSIS — I2510 Atherosclerotic heart disease of native coronary artery without angina pectoris: Secondary | ICD-10-CM

## 2023-08-05 DIAGNOSIS — I5022 Chronic systolic (congestive) heart failure: Secondary | ICD-10-CM

## 2023-08-07 ENCOUNTER — Other Ambulatory Visit: Payer: Self-pay | Admitting: Nurse Practitioner

## 2023-08-16 ENCOUNTER — Other Ambulatory Visit: Payer: Self-pay | Admitting: Nurse Practitioner

## 2023-08-17 ENCOUNTER — Other Ambulatory Visit: Payer: Self-pay

## 2023-08-17 MED ORDER — METOPROLOL SUCCINATE ER 25 MG PO TB24: 12.5000 mg | ORAL_TABLET | Freq: Every day | ORAL | 0 refills | Status: DC

## 2023-09-01 ENCOUNTER — Other Ambulatory Visit: Payer: Self-pay | Admitting: Nurse Practitioner

## 2023-09-01 DIAGNOSIS — E1100 Type 2 diabetes mellitus with hyperosmolarity without nonketotic hyperglycemic-hyperosmolar coma (NKHHC): Secondary | ICD-10-CM

## 2023-09-01 DIAGNOSIS — Z720 Tobacco use: Secondary | ICD-10-CM

## 2023-09-01 DIAGNOSIS — I5020 Unspecified systolic (congestive) heart failure: Secondary | ICD-10-CM

## 2023-09-01 DIAGNOSIS — N1831 Chronic kidney disease, stage 3a: Secondary | ICD-10-CM

## 2023-09-01 DIAGNOSIS — I10 Essential (primary) hypertension: Secondary | ICD-10-CM

## 2023-09-01 DIAGNOSIS — I5022 Chronic systolic (congestive) heart failure: Secondary | ICD-10-CM

## 2023-09-01 DIAGNOSIS — I779 Disorder of arteries and arterioles, unspecified: Secondary | ICD-10-CM

## 2023-09-01 DIAGNOSIS — I2510 Atherosclerotic heart disease of native coronary artery without angina pectoris: Secondary | ICD-10-CM

## 2023-09-11 ENCOUNTER — Other Ambulatory Visit: Payer: Self-pay | Admitting: Nurse Practitioner

## 2023-09-11 DIAGNOSIS — I10 Essential (primary) hypertension: Secondary | ICD-10-CM

## 2023-09-11 DIAGNOSIS — Z720 Tobacco use: Secondary | ICD-10-CM

## 2023-09-11 DIAGNOSIS — I2510 Atherosclerotic heart disease of native coronary artery without angina pectoris: Secondary | ICD-10-CM

## 2023-09-11 DIAGNOSIS — E1100 Type 2 diabetes mellitus with hyperosmolarity without nonketotic hyperglycemic-hyperosmolar coma (NKHHC): Secondary | ICD-10-CM

## 2023-09-11 DIAGNOSIS — N1831 Chronic kidney disease, stage 3a: Secondary | ICD-10-CM

## 2023-09-11 DIAGNOSIS — I779 Disorder of arteries and arterioles, unspecified: Secondary | ICD-10-CM

## 2023-09-11 DIAGNOSIS — I5020 Unspecified systolic (congestive) heart failure: Secondary | ICD-10-CM

## 2023-09-11 DIAGNOSIS — I5022 Chronic systolic (congestive) heart failure: Secondary | ICD-10-CM

## 2023-09-16 ENCOUNTER — Other Ambulatory Visit: Payer: Self-pay | Admitting: Nurse Practitioner

## 2023-09-16 DIAGNOSIS — I2510 Atherosclerotic heart disease of native coronary artery without angina pectoris: Secondary | ICD-10-CM

## 2023-09-16 DIAGNOSIS — I10 Essential (primary) hypertension: Secondary | ICD-10-CM

## 2023-09-16 DIAGNOSIS — E1100 Type 2 diabetes mellitus with hyperosmolarity without nonketotic hyperglycemic-hyperosmolar coma (NKHHC): Secondary | ICD-10-CM

## 2023-09-16 DIAGNOSIS — N1831 Chronic kidney disease, stage 3a: Secondary | ICD-10-CM

## 2023-09-16 DIAGNOSIS — I5020 Unspecified systolic (congestive) heart failure: Secondary | ICD-10-CM

## 2023-09-16 DIAGNOSIS — I779 Disorder of arteries and arterioles, unspecified: Secondary | ICD-10-CM

## 2023-09-16 DIAGNOSIS — Z720 Tobacco use: Secondary | ICD-10-CM

## 2023-09-16 DIAGNOSIS — I5022 Chronic systolic (congestive) heart failure: Secondary | ICD-10-CM

## 2023-09-22 ENCOUNTER — Other Ambulatory Visit: Payer: Self-pay | Admitting: Nurse Practitioner

## 2023-09-22 ENCOUNTER — Other Ambulatory Visit: Payer: Self-pay | Admitting: Internal Medicine

## 2023-09-22 DIAGNOSIS — N1831 Chronic kidney disease, stage 3a: Secondary | ICD-10-CM

## 2023-09-22 DIAGNOSIS — Z720 Tobacco use: Secondary | ICD-10-CM

## 2023-09-22 DIAGNOSIS — I10 Essential (primary) hypertension: Secondary | ICD-10-CM

## 2023-09-22 DIAGNOSIS — I5020 Unspecified systolic (congestive) heart failure: Secondary | ICD-10-CM

## 2023-09-22 DIAGNOSIS — I2510 Atherosclerotic heart disease of native coronary artery without angina pectoris: Secondary | ICD-10-CM

## 2023-09-22 DIAGNOSIS — E1100 Type 2 diabetes mellitus with hyperosmolarity without nonketotic hyperglycemic-hyperosmolar coma (NKHHC): Secondary | ICD-10-CM

## 2023-09-22 DIAGNOSIS — I779 Disorder of arteries and arterioles, unspecified: Secondary | ICD-10-CM

## 2023-09-22 DIAGNOSIS — I5022 Chronic systolic (congestive) heart failure: Secondary | ICD-10-CM

## 2023-10-02 ENCOUNTER — Other Ambulatory Visit: Payer: Self-pay | Admitting: General Practice

## 2023-10-02 DIAGNOSIS — I10 Essential (primary) hypertension: Secondary | ICD-10-CM

## 2023-10-02 DIAGNOSIS — Z720 Tobacco use: Secondary | ICD-10-CM

## 2023-10-02 DIAGNOSIS — E1100 Type 2 diabetes mellitus with hyperosmolarity without nonketotic hyperglycemic-hyperosmolar coma (NKHHC): Secondary | ICD-10-CM

## 2023-10-02 DIAGNOSIS — N1831 Chronic kidney disease, stage 3a: Secondary | ICD-10-CM

## 2023-10-02 DIAGNOSIS — I779 Disorder of arteries and arterioles, unspecified: Secondary | ICD-10-CM

## 2023-10-02 DIAGNOSIS — I5022 Chronic systolic (congestive) heart failure: Secondary | ICD-10-CM

## 2023-10-02 DIAGNOSIS — I2510 Atherosclerotic heart disease of native coronary artery without angina pectoris: Secondary | ICD-10-CM

## 2023-10-02 DIAGNOSIS — I5020 Unspecified systolic (congestive) heart failure: Secondary | ICD-10-CM

## 2023-10-04 ENCOUNTER — Other Ambulatory Visit: Payer: Self-pay

## 2023-10-04 ENCOUNTER — Telehealth: Payer: Self-pay | Admitting: Nurse Practitioner

## 2023-10-04 MED ORDER — LOSARTAN POTASSIUM 25 MG PO TABS: 12.5000 mg | ORAL_TABLET | Freq: Every day | ORAL | 0 refills | Status: DC

## 2023-10-04 NOTE — Telephone Encounter (Signed)
*  STAT* If patient is at the pharmacy, call can be transferred to refill team.   1. Which medications need to be refilled? (please list name of each medication and dose if known) losartan  (COZAAR ) 25 MG tablet  Take 1/2 tablet (12.5 mg total) by mouth daily.   2. Would you like to learn more about the convenience, safety, & potential cost savings by using the [facility]? No    3. Are you open to using the [facility] No   4. Which pharmacy/location (including street and city if local pharmacy) is medication to be sent to?[id number] - [place], [place] - [address]    5. Do they need a 30 day or 90 day supply? 90 Day Supply

## 2023-10-06 ENCOUNTER — Other Ambulatory Visit: Payer: Self-pay | Admitting: Physician Assistant

## 2023-10-06 DIAGNOSIS — I5020 Unspecified systolic (congestive) heart failure: Secondary | ICD-10-CM

## 2023-10-06 DIAGNOSIS — E1100 Type 2 diabetes mellitus with hyperosmolarity without nonketotic hyperglycemic-hyperosmolar coma (NKHHC): Secondary | ICD-10-CM

## 2023-10-06 DIAGNOSIS — I10 Essential (primary) hypertension: Secondary | ICD-10-CM

## 2023-10-06 DIAGNOSIS — N1831 Chronic kidney disease, stage 3a: Secondary | ICD-10-CM

## 2023-10-06 DIAGNOSIS — I2510 Atherosclerotic heart disease of native coronary artery without angina pectoris: Secondary | ICD-10-CM

## 2023-10-06 DIAGNOSIS — Z720 Tobacco use: Secondary | ICD-10-CM

## 2023-10-06 DIAGNOSIS — I779 Disorder of arteries and arterioles, unspecified: Secondary | ICD-10-CM

## 2023-10-06 DIAGNOSIS — I5022 Chronic systolic (congestive) heart failure: Secondary | ICD-10-CM

## 2023-10-12 MED ORDER — LOSARTAN POTASSIUM 25 MG PO TABS: 12.5000 mg | ORAL_TABLET | Freq: Every day | ORAL | 0 refills | Status: DC

## 2023-10-12 NOTE — Addendum Note (Signed)
 Addended by: DARIO IZETTA CROME on: 10/12/2023 10:42 AM   Modules accepted: Orders

## 2023-10-15 ENCOUNTER — Other Ambulatory Visit: Payer: Self-pay | Admitting: Internal Medicine

## 2023-10-19 ENCOUNTER — Telehealth: Payer: Self-pay

## 2023-10-19 ENCOUNTER — Other Ambulatory Visit: Payer: Self-pay | Admitting: Internal Medicine

## 2023-10-19 NOTE — Telephone Encounter (Signed)
 Contact patient for a follow up appointment

## 2023-10-19 NOTE — Telephone Encounter (Signed)
 Patient is scheduled for 11/09/2023 for follow up with Dr. Sam.

## 2023-10-29 ENCOUNTER — Other Ambulatory Visit: Payer: Self-pay | Admitting: Physician Assistant

## 2023-10-29 DIAGNOSIS — E1100 Type 2 diabetes mellitus with hyperosmolarity without nonketotic hyperglycemic-hyperosmolar coma (NKHHC): Secondary | ICD-10-CM

## 2023-10-29 DIAGNOSIS — I5020 Unspecified systolic (congestive) heart failure: Secondary | ICD-10-CM

## 2023-10-29 DIAGNOSIS — Z720 Tobacco use: Secondary | ICD-10-CM

## 2023-10-29 DIAGNOSIS — I10 Essential (primary) hypertension: Secondary | ICD-10-CM

## 2023-10-29 DIAGNOSIS — I5022 Chronic systolic (congestive) heart failure: Secondary | ICD-10-CM

## 2023-10-29 DIAGNOSIS — I2510 Atherosclerotic heart disease of native coronary artery without angina pectoris: Secondary | ICD-10-CM

## 2023-10-29 DIAGNOSIS — N1831 Chronic kidney disease, stage 3a: Secondary | ICD-10-CM

## 2023-10-29 DIAGNOSIS — I779 Disorder of arteries and arterioles, unspecified: Secondary | ICD-10-CM

## 2023-10-31 ENCOUNTER — Other Ambulatory Visit: Payer: Self-pay | Admitting: Physician Assistant

## 2023-11-03 NOTE — Progress Notes (Signed)
 " Cardiology Office Note    Patient Name: Curtis Watkins Date of Encounter: 11/04/2023  Primary Care Provider:  Lorren Greig PARAS, NP Primary Cardiologist:  None Primary Electrophysiologist: None   Past Medical History    Past Medical History:  Diagnosis Date   Blind right eye    Carotid artery disease (HCC)    04/01/20: 60-79% RICA stenosis, 1-39% LICA stenosis   Chronic kidney disease    ckd stage 3 a per dr hobart ronco 02-05-2021 epic   Coronary artery disease    NSTEMI s/p CABG in 03/2020 (LIMA-->LAD/diag, L radial-->OM1/OM3); b.   DM type 2    Glaucoma of right eye, unspecified glaucoma type    HFrEF (heart failure with reduced ejection fraction) (HCC)    Echo 6/22: EF 45, global HK, mild MR, RVSP 31.9   Hypertension    Ischemic cardiomyopathy    PAD (peripheral artery disease) (HCC)    occluded right axillary artery 03/29/20     History of Present Illness  Curtis Watkins is a 68 y.o. male with a PMH of CAD s/p NSTEMI 03/2018 through with CABG x 4 (LIMA-diagonal, LIMA-LAD, left radial artery to OM1, and OM 3), HTN, HLD, tobacco abuse, CKD stage IIIa DM type II, carotid artery disease who presents today for annual follow-up.  Curtis Watkins in 03/2020 with complaint of lower extremity edema and shortness of breath.  2D echo was completed showing EF of 40-45% with hypokinesis of the distal septal and distal inferior lateral wall.  He was ruled in for NSTEMI and underwent left heart cath that revealed severe two-vessel CAD at the LAD and circumflex.  He was referred to CVTS and underwent CABG x 4 on 04/03/2020.  He was discharged and later readmitted on 04/2020 with large left pleural effusion treated with thoracentesis on 05/12/2020.  He was readmitted on/1/22 to 05/28/2020 with worsening LV function and shortness of breath with volume overload.  He was diuresed 20 pounds and underwent repeat right thoracentesis with 1500 cc of fluid removed.  He underwent a transmetatarsal amputation on 09/2021 and was  seen initially by Dr. Hobart on 01/06/2022.  During his visit he was doing well overall with controlled blood pressures.  He was admitted on 03/06/2022 for NSTEMI hypotension with hypoxemia.  He was diagnosed with the flu with elevation in troponins felt to be related to demand ischemia and no intervention at that time.  He was last seen by Dr. Hobart on 07/14/2022 for follow-up visit.  During visit he was on antibiotics after stepping on a piece of glass and undergoing debridement by Dr. Harden.  He was noted to have soft BP and metoprolol  was decreased to 12.5 mg daily and no adjustments made to GDMT at that time.  He was admitted on 01/30/2023 with osteomyelitis and underwent amputation of toes.  Curtis Watkins presents today for annual follow-up. His last cardiology visit was in May 2024, during which he was on antibiotics for a foot injury and undergoing debridement. At that time, hypotension led to a reduction in his medications. Currently, he experiences elevated blood pressure, though it is usually low at home. He adheres to his prescribed medication regimen. In December, he underwent a foot procedure involving toe removal. He engages in light physical activities such as brief walks and household chores without experiencing fatigue. 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 He smokes about a pack of cigarettes daily and has previously attempted cessation with patches, but is not  currently interested in quitting. Patient denies chest pain, palpitations, dyspnea, PND, orthopnea, nausea, vomiting, dizziness, syncope, edema, weight gain, or early satiety.  Discussed the use of AI scribe software for clinical note transcription with the patient, who gave verbal consent to proceed.  History of Present Illness   Review of Systems  Please see the history of present illness.    All other systems reviewed and are otherwise negative except as noted above.  Physical Exam    Wt Readings from Last 3  Encounters:  11/04/23 152 lb (68.9 kg)  06/03/23 160 lb (72.6 kg)  01/30/23 167 lb 8.8 oz (76 kg)   VS: Vitals:   11/04/23 0945 11/04/23 1242  BP: (!) 190/80 (!) 154/84  Pulse: 73   SpO2: 98%   ,Body mass index is 22.45 kg/m. GEN: Well nourished, well developed in no acute distress Neck: No JVD; No carotid bruits Pulmonary: Clear to auscultation without rales, wheezing or rhonchi  Cardiovascular: Normal rate. Regular rhythm. Normal S1. Normal S2.   Murmurs: There is no murmur.  ABDOMEN: Soft, non-tender, non-distended EXTREMITIES:  No edema; toes removed on left foot  EKG/LABS/ Recent Cardiac Studies   ECG personally reviewed by me today -sinus rhythm with rate of 73 bpm and LVH with no acute changes consistent with previous EKG.  Risk Assessment/Calculations:          Lab Results  Component Value Date   WBC 8.3 02/04/2023   HGB 11.9 (L) 02/04/2023   HCT 35.2 (L) 02/04/2023   MCV 92.4 02/04/2023   PLT 183 02/04/2023   Lab Results  Component Value Date   CREATININE 1.30 (H) 02/04/2023   BUN 26 (H) 02/04/2023   NA 135 02/04/2023   K 4.0 02/04/2023   CL 109 02/04/2023   CO2 17 (L) 02/04/2023   Lab Results  Component Value Date   CHOL 100 08/02/2020   HDL 52 08/02/2020   LDLCALC 35 08/02/2020   TRIG 55 08/02/2020   CHOLHDL 1.9 08/02/2020    Lab Results  Component Value Date   HGBA1C 7.6 (H) 01/30/2023   Assessment & Plan    Assessment & Plan   1.  CAD: -s/p CABG x 4 in 03/2020 with LIMA-->LAD/diag, L radial-->OM1/OM3  - Patient reports no angina or chest pain since previous visit. - Continue current GDMT with ASA 81 mg, lipid Toprol -XL 25 mg  2.  HFrEF: Mildly decreased heart function as of last echocardiogram in January. No symptoms of swelling, shortness of breath, or chest pain. - Order echocardiogram to assess current heart function. - Consider transitioning to Entresto if heart function remains suboptimal and blood pressure is controlled. -  Continue current GDMT with Jardiance  25 mg times, Lasix  20 mg 3 times per week, losartan  12.5 mg, Toprol -XL 25 mg and spironolactone  25 mg - Will check CBC and CMET  3.  Essential hypertension: -HYPERTENSION CONTROL Vitals:   11/04/23 0945 11/04/23 1242  BP: (!) 190/80 (!) 154/84    The patient's blood pressure is elevated above target today.  In order to address the patient's elevated BP: Blood pressure will be monitored at home to determine if medication changes need to be made.    - Continue Toprol -XL 25 mg, spironolactone  25 mg, losartan  12.5 mg   4.  Hyperlipidemia: - Patient's last LDL cholesterol was 35 on 04/7975 - We will check LFT and lipids  5.  DM type II: - Patient's last hemoglobin A1c was 7.6 on 01/2023 - Continue Jardiance  25  mg and treatment plan per PCP  6.  Peripheral artery disease: - Continue current GDMT with ASA 81 mg, Lipitor  80 mg  7. Tobacco: Continues to smoke approximately one pack per day. Not interested in quitting but aware of health risks. - Discuss risks of smoking and encourage cessation. - Provide information on smoking cessation resources, including 1-800-QUIT-NOW.   Disposition: Follow-up with None or APP in 6 months    Signed, Wyn Raddle, Jackee Shove, NP 11/04/2023, 12:43 PM [facility] "

## 2023-11-04 ENCOUNTER — Ambulatory Visit: Payer: Self-pay | Attending: Nurse Practitioner | Admitting: Nurse Practitioner

## 2023-11-04 ENCOUNTER — Encounter: Payer: Self-pay | Admitting: Nurse Practitioner

## 2023-11-04 VITALS — BP 154/84 | HR 73 | Ht 69.0 in | Wt 152.0 lb

## 2023-11-04 DIAGNOSIS — I2510 Atherosclerotic heart disease of native coronary artery without angina pectoris: Secondary | ICD-10-CM

## 2023-11-04 DIAGNOSIS — I5022 Chronic systolic (congestive) heart failure: Secondary | ICD-10-CM

## 2023-11-04 DIAGNOSIS — E1100 Type 2 diabetes mellitus with hyperosmolarity without nonketotic hyperglycemic-hyperosmolar coma (NKHHC): Secondary | ICD-10-CM

## 2023-11-04 DIAGNOSIS — I10 Essential (primary) hypertension: Secondary | ICD-10-CM

## 2023-11-04 DIAGNOSIS — I779 Disorder of arteries and arterioles, unspecified: Secondary | ICD-10-CM

## 2023-11-04 LAB — LIPID PANEL

## 2023-11-04 NOTE — Patient Instructions (Addendum)
 Medication Instructions:  Your physician recommends that you continue on your current medications as directed. Please refer to the Current Medication list given to you today. *If you need a refill on your cardiac medications before your next appointment, please call your pharmacy*  Lab Work: TODAY-CBC, CMET, & LIPIDS If you have labs (blood work) drawn today and your tests are completely normal, you will receive your results only by: MyChart Message (if you have MyChart) OR A paper copy in the mail If you have any lab test that is abnormal or we need to change your treatment, we will call you to review the results.  Testing/Procedures: Your physician has requested that you have an echocardiogram. Echocardiography is a painless test that uses sound waves to create images of your heart. It provides your doctor with information about the size and shape of your heart and how well your heart's chambers and valves are working. This procedure takes approximately one hour. There are no restrictions for this procedure. Please do NOT wear cologne, perfume, aftershave, or lotions (deodorant is allowed). Please arrive 15 minutes prior to your appointment time.  Please note: We ask at that you not bring children with you during ultrasound (echo/ vascular) testing. Due to room size and safety concerns, children are not allowed in the ultrasound rooms during exams. Our front office staff cannot provide observation of children in our lobby area while testing is being conducted. An adult accompanying a patient to their appointment will only be allowed in the ultrasound room at the discretion of the ultrasound technician under special circumstances. We apologize for any inconvenience.  Follow-Up: At [facility], you and your health needs are our priority.  As part of our continuing mission to provide you with exceptional heart care, our providers are all part of one team.  This team includes your primary  Cardiologist (physician) and Advanced Practice Providers or APPs (Physician Assistants and Nurse Practitioners) who all work together to provide you with the care you need, when you need it.  Your next appointment:   6 month(s)  Provider:    Joelle Ren Ny, MD  We recommend signing up for the patient portal called MyChart.  Sign up information is provided on this After Visit Summary.  MyChart is used to connect with patients for Virtual Visits (Telemedicine).  Patients are able to view lab/test results, encounter notes, upcoming appointments, etc.  Non-urgent messages can be sent to your provider as well.   To learn more about what you can do with MyChart, go to [email or web address].   Other Instructions Check your blood pressure daily for 2 weeks, then contact the office with your readings.  Contact the office either by phone or MyChart with your readings.  Make sure to check your blood pressure 2 hours after taking your medications.   AVOID these things for 30 minutes before checking your blood pressure: No Drinking caffeine. No Drinking alcohol. No Eating. No Smoking. No Exercising.  Five minutes before checking your blood pressure: Pee. Sit in a dining chair. Avoid sitting in a soft couch or armchair. Be quiet. Do not talk.

## 2023-11-05 ENCOUNTER — Ambulatory Visit: Payer: Self-pay | Admitting: Nurse Practitioner

## 2023-11-05 LAB — CBC
Hematocrit: 46.1 % (ref 37.5–51.0)
Hemoglobin: 15.6 g/dL (ref 13.0–17.7)
MCH: 33 pg (ref 26.6–33.0)
MCHC: 33.8 g/dL (ref 31.5–35.7)
MCV: 98 fL — ABNORMAL HIGH (ref 79–97)
Platelets: 142 x10E3/uL — ABNORMAL LOW (ref 150–450)
RBC: 4.73 x10E6/uL (ref 4.14–5.80)
RDW: 12.8 % (ref 11.6–15.4)
WBC: 6.9 x10E3/uL (ref 3.4–10.8)

## 2023-11-05 LAB — LIPID PANEL
Cholesterol, Total: 101 mg/dL (ref 100–199)
HDL: 46 mg/dL (ref >39)
LDL CALC COMMENT:: 2.2 ratio (ref 0.0–5.0)
LDL Chol Calc (NIH): 40 mg/dL (ref 0–99)
Triglycerides: 73 mg/dL (ref 0–149)
VLDL Cholesterol Cal: 15 mg/dL (ref 5–40)

## 2023-11-05 LAB — COMPREHENSIVE METABOLIC PANEL WITH GFR
ALT: 10 IU/L (ref 0–44)
AST: 15 IU/L (ref 0–40)
Albumin: 4.2 g/dL (ref 3.9–4.9)
Alkaline Phosphatase: 104 IU/L (ref 44–121)
BUN/Creatinine Ratio: 14 (ref 10–24)
BUN: 21 mg/dL (ref 8–27)
Bilirubin Total: 0.6 mg/dL (ref 0.0–1.2)
CO2: 19 mmol/L — AB (ref 20–29)
Calcium: 9.4 mg/dL (ref 8.6–10.2)
Chloride: 105 mmol/L (ref 96–106)
Creatinine, Ser: 1.54 mg/dL — AB (ref 0.76–1.27)
Globulin, Total: 3.2 g/dL (ref 1.5–4.5)
Glucose: 108 mg/dL — AB (ref 70–99)
Potassium: 4.6 mmol/L (ref 3.5–5.2)
Sodium: 142 mmol/L (ref 134–144)
Total Protein: 7.4 g/dL (ref 6.0–8.5)
eGFR: 49 mL/min/1.73 — AB (ref >59)

## 2023-11-08 ENCOUNTER — Other Ambulatory Visit: Payer: Self-pay | Admitting: Physician Assistant

## 2023-11-08 ENCOUNTER — Other Ambulatory Visit: Payer: Self-pay | Admitting: Internal Medicine

## 2023-11-08 DIAGNOSIS — I2510 Atherosclerotic heart disease of native coronary artery without angina pectoris: Secondary | ICD-10-CM

## 2023-11-08 DIAGNOSIS — I779 Disorder of arteries and arterioles, unspecified: Secondary | ICD-10-CM

## 2023-11-08 DIAGNOSIS — E1100 Type 2 diabetes mellitus with hyperosmolarity without nonketotic hyperglycemic-hyperosmolar coma (NKHHC): Secondary | ICD-10-CM

## 2023-11-08 DIAGNOSIS — I10 Essential (primary) hypertension: Secondary | ICD-10-CM

## 2023-11-08 DIAGNOSIS — N1831 Chronic kidney disease, stage 3a: Secondary | ICD-10-CM

## 2023-11-08 DIAGNOSIS — I5022 Chronic systolic (congestive) heart failure: Secondary | ICD-10-CM

## 2023-11-08 DIAGNOSIS — I5020 Unspecified systolic (congestive) heart failure: Secondary | ICD-10-CM

## 2023-11-08 DIAGNOSIS — Z720 Tobacco use: Secondary | ICD-10-CM

## 2023-11-09 ENCOUNTER — Ambulatory Visit: Admitting: Internal Medicine

## 2023-11-09 ENCOUNTER — Encounter: Payer: Self-pay | Admitting: Internal Medicine

## 2023-11-09 VITALS — BP 132/80 | HR 76 | Ht 69.0 in | Wt 156.0 lb

## 2023-11-09 DIAGNOSIS — E1142 Type 2 diabetes mellitus with diabetic polyneuropathy: Secondary | ICD-10-CM

## 2023-11-09 DIAGNOSIS — E1100 Type 2 diabetes mellitus with hyperosmolarity without nonketotic hyperglycemic-hyperosmolar coma (NKHHC): Secondary | ICD-10-CM

## 2023-11-09 DIAGNOSIS — E785 Hyperlipidemia, unspecified: Secondary | ICD-10-CM

## 2023-11-09 DIAGNOSIS — E1122 Type 2 diabetes mellitus with diabetic chronic kidney disease: Secondary | ICD-10-CM

## 2023-11-09 DIAGNOSIS — E113212 Type 2 diabetes mellitus with mild nonproliferative diabetic retinopathy with macular edema, left eye: Secondary | ICD-10-CM

## 2023-11-09 DIAGNOSIS — N1831 Chronic kidney disease, stage 3a: Secondary | ICD-10-CM

## 2023-11-09 DIAGNOSIS — Z7984 Long term (current) use of oral hypoglycemic drugs: Secondary | ICD-10-CM

## 2023-11-09 LAB — POCT GLYCOSYLATED HEMOGLOBIN (HGB A1C): Hemoglobin A1C: 6.6 % — AB (ref 4.0–5.6)

## 2023-11-09 LAB — POCT GLUCOSE (DEVICE FOR HOME USE): POC Glucose: 236 mg/dL — AB (ref 70–99)

## 2023-11-09 MED ORDER — EMPAGLIFLOZIN 25 MG PO TABS: 25.0000 mg | ORAL_TABLET | Freq: Every day | ORAL | 3 refills | Status: AC

## 2023-11-09 NOTE — Patient Instructions (Signed)
  Continue Jardiance  25 mg , 1 tablet before Breakfast      HOW TO TREAT LOW BLOOD SUGARS (Blood sugar LESS THAN 70 MG/DL) Please follow the RULE OF 15 for the treatment of hypoglycemia treatment (when your (blood sugars are less than 70 mg/dL)   STEP 1: Take 15 grams of carbohydrates when your blood sugar is low, which includes:  3-4 GLUCOSE TABS  OR 3-4 OZ OF JUICE OR REGULAR SODA OR ONE TUBE OF GLUCOSE GEL    STEP 2: RECHECK blood sugar in 15 MINUTES STEP 3: If your blood sugar is still low at the 15 minute recheck --> then, go back to STEP 1 and treat AGAIN with another 15 grams of carbohydrates.

## 2023-11-09 NOTE — Progress Notes (Signed)
 Name: Curtis Watkins  Age/ Sex: 68 y.o., male   MRN/ DOB: [id number], May 01, 1955     PCP: Lorren Greig PARAS, NP   Reason for Endocrinology Evaluation: Type 2 Diabetes Mellitus  Initial Endocrine Consultative Visit: 11/01/2020    PATIENT IDENTIFIER: Curtis Watkins is a 68 y.o. male with a past medical history of DM, CAD, CHF, dyslipidemia, and HTN. The patient has followed with Endocrinology clinic since 11/01/2020 for consultative assistance with management of his diabetes.  DIABETIC HISTORY:  Curtis Watkins was diagnosed with DM 2020, he has not been on insulin . His hemoglobin A1c has ranged from 6.7% in 2022, peaking at 8.1% in 2022.  DKA: never Pancreatitis: never   He was followed by Dr. Kassie in 2022 and was lost to follow-up until his return to our clinic in 07/2022, upon then his A1c was 7.6% he was on Jardiance  only, he had stopped Ozempic  due to weight loss   Patient establish care with me in June, 2024 with an A1c of 7.6%, he was on Jardiance  at the time, I added to glimepiride   Patient was lost to follow-up for~15 months after his initial visit with me, until his return in September, 2025 but then he has already been off glimepiride  due to weight loss and decreased appetite???  SUBJECTIVE:   During the last visit (07/28/2022): A1c 7.6%  Today (11/09/2023): Curtis Watkins is here for follow-up on diabetes management.  He checks his blood sugars occasionally . No hypoglycemia    He has NOT been to our clinic in 15 months  Patient follows with Dr. 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 NPDR with macular edemaon the left  Denies:  Last Eye Exam: Completed 04/20/2023  Macrovascular complications:  CAD Denies: CVA, PVD   HISTORY:  Past Medical History:  Past Medical History:  Diagnosis Date   Blind right eye    Carotid artery disease (HCC)    04/01/20: 60-79% RICA stenosis, 1-39% LICA stenosis   Chronic kidney disease    ckd stage 3 a per dr hobart ronco 02-05-2021 epic   Coronary artery disease    NSTEMI s/p CABG in 03/2020 (LIMA-->LAD/diag, L radial-->OM1/OM3); b.   DM type 2    Glaucoma of right eye, unspecified glaucoma type    HFrEF (heart failure with reduced ejection fraction) (HCC)    Echo 6/22: EF 45, global HK, mild MR, RVSP 31.9   Hypertension    Ischemic cardiomyopathy    PAD (peripheral artery disease) (HCC)    occluded right axillary artery 03/29/20    Past Surgical History:  Past Surgical History:  Procedure Laterality Date   AMPUTATION Right 04/24/2020   Procedure: RIGHT 2ND AND 3RD TOE AMPUTATION;  Surgeon: Harden Jerona GAILS, MD;  Location: [facility] OR;  Service: Orthopedics;  Laterality: Right;   AMPUTATION Right 10/22/2021   Procedure: RIGHT TRANSMETATARSAL AMPUTATION;  Surgeon: Harden Jerona GAILS, MD;  Location:  [facility] OR;  Service: Orthopedics;  Laterality: Right;   AMPUTATION Left 09/04/2022   Procedure: LEFT 1ST RAY AMPUTATION;  Surgeon: Harden Jerona GAILS, MD;  Location: [facility] OR;  Service: Orthopedics;  Laterality: Left;   APPLICATION OF WOUND VAC Right 10/22/2021   Procedure: APPLICATION OF WOUND VAC;  Surgeon: Harden Jerona GAILS, MD;  Location: [facility] OR;  Service: Orthopedics;  Laterality: Right;   CENTRAL VENOUS CATHETER INSERTION Left 04/03/2020   Procedure: INSERTION OF FEMORAL ARTERIAL LINE;  Surgeon: German Bartlett PEDLAR, MD;  Location: [facility] OR;  Service: Open Heart Surgery;   Laterality: Left;   CORONARY ARTERY BYPASS GRAFT N/A 04/03/2020   Procedure: CORONARY ARTERY BYPASS GRAFTING (CABG) TIMES FOUR USING LEFT INTERNAL MAMMARY ARTERY AND LEFT RADIAL ARTERY.;  Surgeon: German Bartlett PEDLAR, MD;  Location: [facility] OR;  Service: Open Heart Surgery;  Laterality: N/A;   CORONARY PRESSURE/FFR WITH 3D MAPPING N/A 03/29/2020   Procedure: Coronary Pressure Wire/FFR w/3D Mapping;  Surgeon: Claudene Victory ORN, MD;  Location: [facility] INVASIVE CV LAB;  Service: Cardiovascular;  Laterality: N/A;   EYE SURGERY Right 01/21/2021   right eye cataract lens replacement surgery   I & D EXTREMITY Left 07/01/2022   Procedure: LEFT FOOT DEBRIDEMENT AND FOREIGN BODY REMOVAL;  Surgeon: Harden Jerona GAILS, MD;  Location: [facility] OR;  Service: Orthopedics;  Laterality: Left;   IR THORACENTESIS ASP PLEURAL SPACE W/IMG GUIDE  05/03/2020   IR THORACENTESIS ASP PLEURAL SPACE W/IMG GUIDE  05/16/2020   LEFT HEART CATH AND CORONARY ANGIOGRAPHY N/A 03/29/2020   Procedure: LEFT HEART CATH AND CORONARY ANGIOGRAPHY;  Surgeon: Claudene Victory ORN, MD;  [facility]: [facility];  Service: Cardiovascular;  Laterality: N/A;   METATARSAL HEAD EXCISION Left 02/03/2023   Procedure: LEFT FOOT EXCISION MEDIAL CUNEIFORM;  Surgeon: Harden Jerona GAILS, MD;  Location: [facility] OR;  Service: Orthopedics;  Laterality: Left;   PENILE PROSTHESIS IMPLANT N/A 03/03/2021   Procedure: PENILE PROTHESIS INFLATABLE COLOPLAST;  Surgeon: Carolee Sherwood JONETTA DOUGLAS, MD;  [facility];  Service: Urology;  Laterality: N/A;  REQUESTING 2 HRS   RADIAL ARTERY HARVEST Left 04/03/2020   Procedure: LEFT RADIAL ARTERY HARVEST;  Surgeon: German Bartlett PEDLAR, MD;  Location: [facility] OR;  Service: Open Heart Surgery;  Laterality: Left;   TEE WITHOUT CARDIOVERSION N/A 04/03/2020   Procedure: TRANSESOPHAGEAL ECHOCARDIOGRAM (TEE);  Surgeon: German Bartlett PEDLAR, MD;  Location[facility] OR;  Service: Open Heart Surgery;  Laterality: N/A;   UPPER EXTREMITY ANGIOGRAPHY  03/29/2020    Procedure: Upper Extremity Angiography;  Surgeon: Claudene Victory ORN, MD;  Location: [facility] INVASIVE CV LAB;  Service: Cardiovascular;;  rt.  arm   Social History:  reports that he has been smoking cigarettes. He has a 0.5 pack-year smoking history. He has been exposed to tobacco smoke. He has never used smokeless tobacco. He reports that he does not currently use alcohol after a past usage of about 1.0 standard drink of alcohol per week. He reports that he does not currently use drugs after having used the following drugs: Marijuana. Family History:  Family History  Problem Relation Age of Onset   Diabetes Mother      HOME MEDICATIONS: Allergies as of 11/09/2023       Reactions   Vancomycin     01/30/2023 had discrete hives, only in the arm where he was getting the infusion. Had brisk response to Benadryl . We then followed with a reduced rate of vancomycin , and he tolerated the entire dose with no further rash.  Medication List        Accurate as of November 09, 2023 11:08 AM. If you have any questions, ask your nurse or doctor.          aspirin  325 MG tablet Take 162 mg by mouth in the morning.   atorvastatin  80 MG tablet Commonly known as: LIPITOR  TAKE 1 TABLET BY MOUTH EVERY DAY   blood glucose meter kit and supplies by Other route as directed. Dispense based on patient and insurance preference. Use up to 2x times daily as directed. (FOR ICD-10 E10.9, E11.9).   brimonidine  0.2 % ophthalmic solution Commonly known as: ALPHAGAN  Place 1 drop into the left eye 2 (two) times daily.   dorzolamide -timolol  2-0.5 % ophthalmic solution Commonly known as: COSOPT  Place 1 drop into the left eye 2 (two) times daily.   furosemide  20 MG tablet Commonly known as: LASIX  TAKE 1 TABLET BY MOUTH 3 TIMES  WEEKLY ON MONDAYS, WEDNESDAYS,  AND FRIDAYS   glimepiride  1 MG tablet Commonly known as: AMARYL  TAKE 1 TABLET BY MOUTH EVERY DAY BEFORE BREAKFAST   Jardiance  25 MG Tabs  tablet Generic drug: empagliflozin  TAKE 1 TABLET BY MOUTH EVERY DAY BEFORE BREAKFAST   losartan  25 MG tablet Commonly known as: COZAAR  Take 1/2 tablet (12.5 mg total) by mouth daily.   metoprolol  succinate 25 MG 24 hr tablet Commonly known as: TOPROL -XL TAKE ONE-HALF TABLET BY MOUTH  DAILY   pentoxifylline  400 MG CR tablet Commonly known as: TRENTAL  TAKE 1 TABLET BY MOUTH 3 TIMES DAILY WITH MEALS.   spironolactone  25 MG tablet Commonly known as: ALDACTONE  TAKE ONE-HALF TABLET BY MOUTH  DAILY         OBJECTIVE:   Vital Signs: There were no vitals taken for this visit.  Wt Readings from Last 3 Encounters:  11/04/23 152 lb (68.9 kg)  06/03/23 160 lb (72.6 kg)  01/30/23 167 lb 8.8 oz (76 kg)     Exam: General: Pt appears well and is in NAD  Neck: General: Supple without adenopathy. Thyroid : Thyroid  size normal.  No goiter or nodules appreciated.   Lungs: Clear with good BS bilat   Heart: RRR   Extremities: No pretibial edema.   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      DATA REVIEWED:  Lab Results  Component Value Date   HGBA1C 7.6 (H) 01/30/2023   HGBA1C 7.6 (A) 07/28/2022   HGBA1C 6.9 (A) 11/28/2021    Latest Reference Range & Units 11/04/23 11:03  Sodium 134 - 144 mmol/L 142  Potassium 3.5 - 5.2 mmol/L 4.6  Chloride 96 - 106 mmol/L 105  CO2 20 - 29 mmol/L 19 (L)  Glucose 70 - 99 mg/dL 891 (H)  BUN 8 - 27 mg/dL 21  Creatinine 9.23 - 8.72 mg/dL 8.45 (H)  Calcium  8.6 - 10.2 mg/dL 9.4  BUN/Creatinine Ratio 10 - 24  14  eGFR >59 mL/min/1.73 49 (L)  Alkaline Phosphatase 44 - 121 IU/L 104  Albumin  3.9 - 4.9 g/dL 4.2  AST 0 - 40 IU/L 15  ALT 0 - 44 IU/L 10  Total Protein 6.0 - 8.5 g/dL 7.4  Total Bilirubin 0.0 - 1.2 mg/dL 0.6    Latest Reference Range & Units 11/04/23 11:03  Total CHOL/HDL Ratio 0.0 - 5.0 ratio 2.2   Cholesterol, Total 100 - 199 mg/dL 898  HDL Cholesterol >  39 mg/dL 46  Triglycerides 0 - 850 mg/dL 73  VLDL Cholesterol Cal 5 - 40 mg/dL 15  LDL Chol Calc (NIH) 0 - 99 mg/dL 40     Old records , labs and images have been reviewed.  In office BG 236 mg/DL  ASSESSMENT / PLAN / RECOMMENDATIONS:   1) Type 2 Diabetes Mellitus, optimally controlled, With neuropathic, CKD III, and macrovascular complications - Most recent A1c of 6.6 %. Goal A1c < 7.0 %.    -A1c is optimal -I started him on glimepiride  in 2024 but he discontinued due to weight loss and decreased appetite? -Patient on Jardiance  only -He stopped Ozempic  due to extreme weight loss   MEDICATIONS: Continue Jardiance  25 mg, 1 tablet before breakfast  EDUCATION / INSTRUCTIONS: BG monitoring instructions: Patient is instructed to check his blood sugars 1 times a day. Call [place] [facility] if: BG persistently < 70  I reviewed the Rule of 15 for the treatment of hypoglycemia in detail with the patient. Literature supplied.    2) Diabetic complications:  Eye: Does  have known diabetic retinopathy.  Neuro/ Feet: Does  have known diabetic peripheral neuropathy .  Renal: Patient does  have known baseline CKD.    2) Dyslipidemia :  -LDL has been at goal -Patient to continue atorvastatin  80 mg daily    F/U in 6 months       Signed electronically by: Stefano Redgie Butts, MD  [facility] Endocrinology  [facility] [address] [place], [place] [address] Phone: [phone] FAX: [phone]   CC: Lorren Greig PARAS, NP [address] [place] [address] Phone: [phone]  Fax: [phone]  Return to Endocrinology clinic as below: Future Appointments  Date Time Provider Department Center  12/06/2023 11:35 AM HVC-ECHO 5 HVC-ECHO H&V  06/15/2024 11:30 AM PCE-ANNUAL WELLNESS VISIT PCE-PCE [address]

## 2023-11-19 ENCOUNTER — Other Ambulatory Visit: Payer: Self-pay | Admitting: Internal Medicine

## 2023-11-19 ENCOUNTER — Other Ambulatory Visit: Payer: Self-pay | Admitting: Nurse Practitioner

## 2023-11-25 ENCOUNTER — Other Ambulatory Visit: Payer: Self-pay | Admitting: Nurse Practitioner

## 2023-11-26 MED ORDER — SPIRONOLACTONE 25 MG PO TABS: 12.5000 mg | ORAL_TABLET | Freq: Every day | ORAL | 3 refills | Status: AC

## 2023-12-06 ENCOUNTER — Ambulatory Visit (HOSPITAL_COMMUNITY): Payer: Self-pay

## 2023-12-20 ENCOUNTER — Other Ambulatory Visit: Payer: Self-pay | Admitting: Internal Medicine

## 2023-12-27 ENCOUNTER — Encounter: Payer: Self-pay | Admitting: Radiology

## 2023-12-30 ENCOUNTER — Inpatient Hospital Stay (HOSPITAL_COMMUNITY)

## 2023-12-30 ENCOUNTER — Encounter (HOSPITAL_COMMUNITY): Payer: Self-pay | Admitting: Hospitalist

## 2023-12-30 ENCOUNTER — Other Ambulatory Visit: Payer: Self-pay

## 2023-12-30 ENCOUNTER — Encounter (HOSPITAL_COMMUNITY): Payer: Self-pay

## 2023-12-30 ENCOUNTER — Observation Stay (HOSPITAL_COMMUNITY)

## 2023-12-30 ENCOUNTER — Observation Stay (HOSPITAL_COMMUNITY)
Admission: EM | Admit: 2023-12-30 | Discharge: 2024-01-01 | Disposition: A | Discharge status: Home / Self Care | Source: Ambulatory Visit | Attending: Family Medicine | Admitting: Family Medicine

## 2023-12-30 DIAGNOSIS — R112 Nausea with vomiting, unspecified: Secondary | ICD-10-CM | POA: Insufficient documentation

## 2023-12-30 DIAGNOSIS — I739 Peripheral vascular disease, unspecified: Secondary | ICD-10-CM | POA: Insufficient documentation

## 2023-12-30 DIAGNOSIS — N183 Chronic kidney disease, stage 3 unspecified: Secondary | ICD-10-CM | POA: Diagnosis present

## 2023-12-30 DIAGNOSIS — N1832 Chronic kidney disease, stage 3b: Secondary | ICD-10-CM | POA: Diagnosis not present

## 2023-12-30 DIAGNOSIS — E1100 Type 2 diabetes mellitus with hyperosmolarity without nonketotic hyperglycemic-hyperosmolar coma (NKHHC): Principal | ICD-10-CM

## 2023-12-30 DIAGNOSIS — I10 Essential (primary) hypertension: Secondary | ICD-10-CM | POA: Diagnosis present

## 2023-12-30 DIAGNOSIS — I214 Non-ST elevation (NSTEMI) myocardial infarction: Secondary | ICD-10-CM | POA: Diagnosis not present

## 2023-12-30 DIAGNOSIS — Z7982 Long term (current) use of aspirin: Secondary | ICD-10-CM | POA: Insufficient documentation

## 2023-12-30 DIAGNOSIS — I779 Disorder of arteries and arterioles, unspecified: Secondary | ICD-10-CM

## 2023-12-30 DIAGNOSIS — E1169 Type 2 diabetes mellitus with other specified complication: Secondary | ICD-10-CM

## 2023-12-30 DIAGNOSIS — Z720 Tobacco use: Secondary | ICD-10-CM

## 2023-12-30 DIAGNOSIS — I2510 Atherosclerotic heart disease of native coronary artery without angina pectoris: Secondary | ICD-10-CM

## 2023-12-30 DIAGNOSIS — E1122 Type 2 diabetes mellitus with diabetic chronic kidney disease: Secondary | ICD-10-CM | POA: Diagnosis not present

## 2023-12-30 DIAGNOSIS — Z743 Need for continuous supervision: Secondary | ICD-10-CM | POA: Diagnosis not present

## 2023-12-30 DIAGNOSIS — R079 Chest pain, unspecified: Secondary | ICD-10-CM | POA: Diagnosis present

## 2023-12-30 DIAGNOSIS — E1139 Type 2 diabetes mellitus with other diabetic ophthalmic complication: Secondary | ICD-10-CM | POA: Insufficient documentation

## 2023-12-30 DIAGNOSIS — N1831 Chronic kidney disease, stage 3a: Secondary | ICD-10-CM

## 2023-12-30 DIAGNOSIS — I130 Hypertensive heart and chronic kidney disease with heart failure and stage 1 through stage 4 chronic kidney disease, or unspecified chronic kidney disease: Secondary | ICD-10-CM | POA: Insufficient documentation

## 2023-12-30 DIAGNOSIS — Z87891 Personal history of nicotine dependence: Secondary | ICD-10-CM | POA: Insufficient documentation

## 2023-12-30 DIAGNOSIS — R7989 Other specified abnormal findings of blood chemistry: Secondary | ICD-10-CM | POA: Diagnosis present

## 2023-12-30 DIAGNOSIS — E119 Type 2 diabetes mellitus without complications: Secondary | ICD-10-CM

## 2023-12-30 DIAGNOSIS — I25110 Atherosclerotic heart disease of native coronary artery with unstable angina pectoris: Secondary | ICD-10-CM | POA: Diagnosis present

## 2023-12-30 DIAGNOSIS — Z79899 Other long term (current) drug therapy: Secondary | ICD-10-CM | POA: Insufficient documentation

## 2023-12-30 DIAGNOSIS — I4581 Long QT syndrome: Secondary | ICD-10-CM | POA: Insufficient documentation

## 2023-12-30 DIAGNOSIS — R9431 Abnormal electrocardiogram [ECG] [EKG]: Secondary | ICD-10-CM | POA: Diagnosis present

## 2023-12-30 DIAGNOSIS — R531 Weakness: Secondary | ICD-10-CM | POA: Diagnosis not present

## 2023-12-30 DIAGNOSIS — W19XXXA Unspecified fall, initial encounter: Secondary | ICD-10-CM | POA: Insufficient documentation

## 2023-12-30 DIAGNOSIS — K529 Noninfective gastroenteritis and colitis, unspecified: Secondary | ICD-10-CM | POA: Diagnosis present

## 2023-12-30 DIAGNOSIS — I5020 Unspecified systolic (congestive) heart failure: Secondary | ICD-10-CM | POA: Diagnosis present

## 2023-12-30 DIAGNOSIS — I5022 Chronic systolic (congestive) heart failure: Secondary | ICD-10-CM

## 2023-12-30 DIAGNOSIS — S91302A Unspecified open wound, left foot, initial encounter: Secondary | ICD-10-CM | POA: Insufficient documentation

## 2023-12-30 LAB — BLOOD GAS, VENOUS
Acid-base deficit: 3.5 mmol/L — ABNORMAL HIGH (ref 0.0–2.0)
Bicarbonate: 20.6 mmol/L (ref 20.0–28.0)
Drawn by: 32061
O2 Saturation: 100 %
Patient temperature: 36.7
pCO2, Ven: 34 mmHg — ABNORMAL LOW (ref 44–60)
pH, Ven: 7.39 (ref 7.25–7.43)
pO2, Ven: 157 mmHg — ABNORMAL HIGH (ref 32–45)

## 2023-12-30 LAB — MAGNESIUM: Magnesium: 2.1 mg/dL (ref 1.7–2.4)

## 2023-12-30 LAB — COMPREHENSIVE METABOLIC PANEL WITH GFR
ALT: 12 U/L (ref 0–44)
AST: 15 U/L (ref 15–41)
Albumin: 3.6 g/dL (ref 3.5–5.0)
Alkaline Phosphatase: 72 U/L (ref 38–126)
Anion gap: 11 (ref 5–15)
BUN: 32 mg/dL — ABNORMAL HIGH (ref 8–23)
CO2: 20 mmol/L — ABNORMAL LOW (ref 22–32)
Calcium: 8.9 mg/dL (ref 8.9–10.3)
Chloride: 108 mmol/L (ref 98–111)
Creatinine, Ser: 1.56 mg/dL — ABNORMAL HIGH (ref 0.61–1.24)
GFR, Estimated: 48 mL/min — ABNORMAL LOW (ref >60)
Glucose, Bld: 112 mg/dL — ABNORMAL HIGH (ref 70–99)
Potassium: 3.9 mmol/L (ref 3.5–5.1)
Sodium: 139 mmol/L (ref 135–145)
Total Bilirubin: 0.9 mg/dL (ref 0.0–1.2)
Total Protein: 7.1 g/dL (ref 6.5–8.1)

## 2023-12-30 LAB — CBC WITH DIFFERENTIAL/PLATELET
Abs Immature Granulocytes: 0.04 K/uL (ref 0.00–0.07)
Basophils Absolute: 0 K/uL (ref 0.0–0.1)
Basophils Relative: 0 %
Eosinophils Absolute: 0 K/uL (ref 0.0–0.5)
Eosinophils Relative: 0 %
HCT: 42.3 % (ref 39.0–52.0)
Hemoglobin: 14.7 g/dL (ref 13.0–17.0)
Immature Granulocytes: 0 %
Lymphocytes Relative: 17 %
Lymphs Abs: 1.7 K/uL (ref 0.7–4.0)
MCH: 32.5 pg (ref 26.0–34.0)
MCHC: 34.8 g/dL (ref 30.0–36.0)
MCV: 93.6 fL (ref 80.0–100.0)
Monocytes Absolute: 0.7 K/uL (ref 0.1–1.0)
Monocytes Relative: 7 %
Neutro Abs: 7.6 K/uL (ref 1.7–7.7)
Neutrophils Relative %: 76 %
Platelets: 181 K/uL (ref 150–400)
RBC: 4.52 MIL/uL (ref 4.22–5.81)
RDW: 13 % (ref 11.5–15.5)
WBC: 10 K/uL (ref 4.0–10.5)
nRBC: 0 % (ref 0.0–0.2)

## 2023-12-30 LAB — LACTIC ACID, PLASMA
Lactic Acid, Venous: 1.1 mmol/L (ref 0.5–1.9)
Lactic Acid, Venous: 1.2 mmol/L (ref 0.5–1.9)

## 2023-12-30 LAB — CK: Total CK: 100 U/L (ref 49–397)

## 2023-12-30 LAB — GLUCOSE, CAPILLARY: Glucose-Capillary: 103 mg/dL — ABNORMAL HIGH (ref 70–99)

## 2023-12-30 LAB — HEMOGLOBIN A1C
Hgb A1c MFr Bld: 6.2 % — ABNORMAL HIGH (ref 4.8–5.6)
Mean Plasma Glucose: 131.24 mg/dL

## 2023-12-30 LAB — TSH: TSH: 0.689 u[IU]/mL (ref 0.350–4.500)

## 2023-12-30 LAB — PHOSPHORUS: Phosphorus: 3.9 mg/dL (ref 2.5–4.6)

## 2023-12-30 MED ORDER — METOPROLOL SUCCINATE ER 25 MG PO TB24
12.5000 mg | ORAL_TABLET | Freq: Every day | ORAL | Status: DC
  Administered 2023-12-31 – 2024-01-01 (×2): 12.5 mg via ORAL
  Filled 2023-12-30 (×2): qty 1

## 2023-12-30 MED ORDER — SODIUM CHLORIDE 0.9 % IV SOLN: INTRAVENOUS | Status: DC

## 2023-12-30 MED ORDER — INSULIN ASPART 100 UNIT/ML IJ SOLN
0.0000 [IU] | INTRAMUSCULAR | Status: DC
  Administered 2023-12-31: 2 [IU] via SUBCUTANEOUS
  Administered 2023-12-31: 1 [IU] via SUBCUTANEOUS
  Filled 2023-12-30: qty 2
  Filled 2023-12-30: qty 1
  Filled 2023-12-30: qty 2

## 2023-12-30 MED ORDER — HYDROCODONE-ACETAMINOPHEN 5-325 MG PO TABS: 1.0000 | ORAL_TABLET | ORAL | Status: DC | PRN

## 2023-12-30 MED ORDER — ONDANSETRON HCL 4 MG PO TABS: 4.0000 mg | ORAL_TABLET | Freq: Four times a day (QID) | ORAL | Status: DC | PRN

## 2023-12-30 MED ORDER — CLOPIDOGREL BISULFATE 75 MG PO TABS
75.0000 mg | ORAL_TABLET | Freq: Every day | ORAL | Status: DC
  Administered 2023-12-30 – 2024-01-01 (×3): 75 mg via ORAL
  Filled 2023-12-30 (×3): qty 1

## 2023-12-30 MED ORDER — ACETAMINOPHEN 325 MG PO TABS: 650.0000 mg | ORAL_TABLET | Freq: Four times a day (QID) | ORAL | Status: DC | PRN

## 2023-12-30 MED ORDER — BRIMONIDINE TARTRATE 0.2 % OP SOLN
1.0000 [drp] | Freq: Two times a day (BID) | OPHTHALMIC | Status: DC
  Administered 2023-12-30 – 2024-01-01 (×4): 1 [drp] via OPHTHALMIC
  Filled 2023-12-30: qty 5

## 2023-12-30 MED ORDER — ATORVASTATIN CALCIUM 80 MG PO TABS
80.0000 mg | ORAL_TABLET | Freq: Every day | ORAL | Status: DC
  Administered 2023-12-31 – 2024-01-01 (×2): 80 mg via ORAL
  Filled 2023-12-30 (×2): qty 1

## 2023-12-30 MED ORDER — ASPIRIN 81 MG PO TBEC
81.0000 mg | DELAYED_RELEASE_TABLET | Freq: Every day | ORAL | Status: DC
  Administered 2023-12-31 – 2024-01-01 (×2): 81 mg via ORAL
  Filled 2023-12-30 (×3): qty 1

## 2023-12-30 MED ORDER — ACETAMINOPHEN 650 MG RE SUPP: 650.0000 mg | Freq: Four times a day (QID) | RECTAL | Status: DC | PRN

## 2023-12-30 MED ORDER — FENTANYL CITRATE (PF) 50 MCG/ML IJ SOSY: 12.5000 ug | PREFILLED_SYRINGE | INTRAMUSCULAR | Status: DC | PRN

## 2023-12-30 MED ORDER — DORZOLAMIDE HCL-TIMOLOL MAL 2-0.5 % OP SOLN
1.0000 [drp] | Freq: Two times a day (BID) | OPHTHALMIC | Status: DC
  Administered 2023-12-30 – 2024-01-01 (×4): 1 [drp] via OPHTHALMIC
  Filled 2023-12-30: qty 10

## 2023-12-30 MED ORDER — ONDANSETRON HCL 4 MG/2ML IJ SOLN: 4.0000 mg | Freq: Four times a day (QID) | INTRAMUSCULAR | Status: DC | PRN

## 2023-12-30 NOTE — Assessment & Plan Note (Signed)
 -  Order Sensitive  SSI     -  check TSH and HgA1C  - Hold by mouth medications*

## 2023-12-30 NOTE — Assessment & Plan Note (Signed)
 In the setting of N/V down form baseline will continue to cycle  ECG non acute will obtain serial ECG  Trop and echo in AM  Cardiology consult pending further workup

## 2023-12-30 NOTE — Subjective & Objective (Signed)
 Patient with CP and emesis Patient has known history of PAD and CAD status post CABG Reported midsternal indigestion-like pain generalized fatigue noted to be diaphoretic and generally weak Abdomen nontender noted to have diarrhea EKG nonacute Nausea and vomiting improved with Reglan and Protonix   found to have elevated troponin trop I 0.075 ->0.09 Of note back in 2024 trop was 0.133 WBC 9.0 hg 16.3 Na 140  Bicarb 18 Cr 1.8 LFT wnl ED gave aspirin  ER provider initially discussed case with cardiology Dr. Delford who felt that chest pain was unlikely to be a ACS and recommended hospitalist admission Patient refused heparin 

## 2023-12-30 NOTE — H&P (Signed)
 Curtis Watkins FMW:[id number] DOB: 12/14/1955 DOA: 12/30/2023     PCP: Jaycee Greig PARAS, NP   Outpatient Specialists:  CARDS: Jackee Alberts NP Patient arrived to ER on  at  Referred by Attending Georgina Basket, MD   Patient coming from:    home Lives  With family    Chief Complaint: chest pain nausea and vomiting   HPI: Curtis Watkins is a 68 y.o. male with medical history significant of CAD, PAD, HTN HLD, DM2  CKD 3a, systolic CHF, glaucoma, tobacco abuse  Presented with  n/v/D and chest pain Patient with CP and emesis Patient has known history of PAD and CAD status post CABG Reported midsternal indigestion-like pain generalized fatigue noted to be diaphoretic and generally weak Abdomen nontender noted to have diarrhea EKG nonacute Nausea and vomiting improved with Reglan and Protonix   found to have elevated troponin trop I 0.075 ->0.09 Of note back in 2024 trop was 0.133 WBC 9.0 hg 16.3 Na 140  Bicarb 18 Cr 1.8 LFT wnl ED gave aspirin  ER provider initially discussed case with cardiology Dr. Delford who felt that chest pain was unlikely to be a ACS and recommended hospitalist admission Patient refused heparin       Reports chest pain this morning after he was vomiting felt like acid reflux  It lasted about 1 hour Reports soft stool No unusual foods  No sick contacts  No changes in medications   Denies significant ETOH intake   Does  smoke  but interested in quitting       Regarding pertinent Chronic problems:    Hyperlipidemia - on statins Lipitor  (atorvastatin )  Lipid Panel     Component Value Date/Time   CHOL 101 11/04/2023 1103   TRIG 73 11/04/2023 1103   HDL 46 11/04/2023 1103   CHOLHDL 2.2 11/04/2023 1103   CHOLHDL 2.8 03/29/2020 0600   VLDL 12 03/29/2020 0600   LDLCALC 40 11/04/2023 1103   LABVLDL 15 11/04/2023 1103     HTN on hydrochlorothiazide, cozaar , toprol  spironolactone    chronic CHF  systolic - last echo on lasix  Recent Results (from the past  43800 hours)  ECHOCARDIOGRAM COMPLETE   Collection Time: 03/06/22 10:04 AM  Result Value   BP 99/71   Single Plane A2C EF 34.0   Single Plane A4C EF 48.2   Calc EF 40.8   S' Lateral 3.50   AR max vel 2.66   AV Area VTI 2.61   AV Mean grad 2.0   AV Peak grad 4.5   Ao pk vel 1.06   Area-P 1/2 3.91   MV M vel 5.56   AV Area mean vel 2.50   MV Peak grad 123.8   Est EF 45 - 50%   Narrative      ECHOCARDIOGRAM REPORT       1. Left ventricular ejection fraction, by estimation, is 45 to 50%. The left ventricle has mildly decreased function. The left ventricle demonstrates global hypokinesis. Left ventricular diastolic parameters were normal. The average left ventricular  global longitudinal strain is -17.8 %. The global longitudinal strain is abnormal.  2. Right ventricular systolic function is normal. The right ventricular size is normal. There is normal pulmonary artery systolic pressure. The estimated right ventricular systolic pressure is 6.0 mmHg.  3. The mitral valve is grossly normal. Mild mitral valve regurgitation. No evidence of mitral stenosis.  4. The aortic valve is tricuspid. Aortic valve regurgitation is not visualized. No aortic stenosis is present.  5. The inferior vena cava is normal in size with greater than 50% respiratory variability, suggesting right atrial pressure of 3 mmHg.  Comparison(s): No significant change from prior study.           CAD  - On Aspirin , statin, betablocker, Plavix                  -  followed by cardiology                - last cardiac cath 2022    Left Anterior Descending  There is mild diffuse disease throughout the vessel.  Mid LAD lesion is 85% stenosed.    First Diagonal Branch  Vessel is small in size. There is mild disease in the vessel.    Second Diagonal Branch  There is moderate disease in the vessel.    Third Diagonal Branch  There is moderate disease in the vessel.    Left Circumflex  Prox Cx to Mid Cx lesion is 80%  stenosed.    First Obtuse Marginal Branch  1st Mrg lesion is 90% stenosed.    Right Coronary Artery  There is moderate diffuse disease throughout the vessel.    Right Posterior Descending Artery  There is moderate disease in the vessel.    Right Posterior Atrioventricular Artery  There is moderate disease in the vessel.        DM 2 -  Lab Results  Component Value Date   HGBA1C 6.6 (A) 11/09/2023   PO meds only,  jardiance     CKD stage IIIa baseline Cr 1.5 CrCl cannot be calculated (Patient's most recent lab result is older than the maximum 21 days allowed.).  Lab Results  Component Value Date   CREATININE 1.54 (H) 11/04/2023   CREATININE 1.30 (H) 02/04/2023   CREATININE 1.39 (H) 02/03/2023   Lab Results  Component Value Date   NA 142 11/04/2023   CL 105 11/04/2023   K 4.6 11/04/2023   CO2 19 (L) 11/04/2023   BUN 21 11/04/2023   CREATININE 1.54 (H) 11/04/2023   EGFR 49 (L) 11/04/2023   CALCIUM  9.4 11/04/2023   PHOS 2.7 02/01/2023   ALBUMIN  4.2 11/04/2023   GLUCOSE 108 (H) 11/04/2023        Lab Orders  No laboratory test(s) ordered today   CXR - ***NON acute  CTabd/pelvis - ***nonacute  CTA chest - ***nonacute, no PE, * no evidence of infiltrate  Following Medications were ordered in ER: Medications - No data to display  _______________________________________________________ ER Provider Called:     Cardiology  Dr. Delford  They Recommend admit to medicine         ED Triage Vitals  Encounter Vitals Group     BP 12/30/23 1800 112/74     Girls Systolic BP Percentile --      Girls Diastolic BP Percentile --      Boys Systolic BP Percentile --      Boys Diastolic BP Percentile --      Pulse Rate 12/30/23 1800 74     Resp 12/30/23 1800 18     Temp 12/30/23 1800 98.1 F (36.7 C)     Temp Source 12/30/23 1800 Oral     SpO2 12/30/23 1800 100 %     Weight 12/30/23 1750 143 lb 9.6 oz (65.1 kg)     Height --      Head Circumference --      Peak  Flow --  Pain Score 12/30/23 1800 0     Pain Loc --      Pain Education --      Exclude from Growth Chart --   UFJK(75)@     _________________________________________ Significant initial  Findings: Abnormal Labs Reviewed - No data to display    _________________________ Troponin ***ordered Cardiac Panel (last 3 results) No results for input(s): CKTOTAL, CKMB, TROPONINIHS, RELINDX in the last 72 hours.   ECG: Ordered Personally reviewed and interpreted by me showing: HR : *** Rhythm: *NSR, Sinus tachycardia * A.fib. W RVR, RBBB, LBBB, Paced Ischemic changes*nonspecific changes, no evidence of ischemic changes QTC*    The recent clinical data is shown below. Vitals:   12/30/23 1750 12/30/23 1800  BP:  112/74  Pulse:  74  Resp:  18  Temp:  98.1 F (36.7 C)  TempSrc:  Oral  SpO2:  100%  Weight: 65.1 kg     WBC     Component Value Date/Time   WBC 6.9 11/04/2023 1103   WBC 8.3 02/04/2023 0544        Lactic Acid, Venous No results found for: LATICACIDVEN   Lactic Acid, Venous No results found for: LATICACIDVEN  Procalcitonin *** Ordered      UA *** no evidence of UTI  ***Pending ***not ordered   Urine analysis:    Component Value Date/Time   COLORURINE YELLOW 04/02/2020 0501   APPEARANCEUR CLEAR 04/02/2020 0501   LABSPEC 1.015 04/02/2020 0501   PHURINE 6.0 04/02/2020 0501   GLUCOSEU NEGATIVE 04/02/2020 0501   HGBUR MODERATE (A) 04/02/2020 0501   BILIRUBINUR NEGATIVE 04/02/2020 0501   KETONESUR NEGATIVE 04/02/2020 0501   PROTEINUR 100 (A) 04/02/2020 0501   NITRITE NEGATIVE 04/02/2020 0501   LEUKOCYTESUR NEGATIVE 04/02/2020 0501    Results for orders placed or performed during the hospital encounter of 01/30/23  SARS Coronavirus 2 by RT PCR (hospital order, performed in [facility] hospital lab) *cepheid single result test* Anterior Nasal Swab     Status: None   Collection Time: 01/30/23  4:13 PM   Specimen: Anterior Nasal Swab   Result Value Ref Range Status   SARS Coronavirus 2 by RT PCR NEGATIVE NEGATIVE Final    Comment: Performed at [facility] Lab, [address] [place], [place] [address]  Respiratory (~20 pathogens) panel by PCR     Status: Abnormal   Collection Time: 01/30/23  4:14 PM   Specimen: Nasopharyngeal Swab; Respiratory  Result Value Ref Range Status   Adenovirus NOT DETECTED NOT DETECTED Final   Coronavirus 229E NOT DETECTED NOT DETECTED Final    Comment: (NOTE) The Coronavirus on the Respiratory Panel, DOES NOT test for the novel  Coronavirus (2019 nCoV)    Coronavirus HKU1 NOT DETECTED NOT DETECTED Final   Coronavirus NL63 NOT DETECTED NOT DETECTED Final   Coronavirus OC43 NOT DETECTED NOT DETECTED Final   Metapneumovirus NOT DETECTED NOT DETECTED Final   Rhinovirus / Enterovirus DETECTED (A) NOT DETECTED Final   Influenza A NOT DETECTED NOT DETECTED Final   Influenza B NOT DETECTED NOT DETECTED Final   Parainfluenza Virus 1 NOT DETECTED NOT DETECTED Final   Parainfluenza Virus 2 NOT DETECTED NOT DETECTED Final   Parainfluenza Virus 3 NOT DETECTED NOT DETECTED Final   Parainfluenza Virus 4 NOT DETECTED NOT DETECTED Final   Respiratory Syncytial Virus NOT DETECTED NOT DETECTED Final   Bordetella pertussis NOT DETECTED NOT DETECTED Final   Bordetella Parapertussis NOT DETECTED NOT DETECTED Final   Chlamydophila  pneumoniae NOT DETECTED NOT DETECTED Final   Mycoplasma pneumoniae NOT DETECTED NOT DETECTED Final    Comment: Performed at [facility], [address] [place], [place] [address]  Culture, blood (Routine X 2) w Reflex to ID Panel     Status: None   Collection Time: 01/30/23  4:44 PM   Specimen: BLOOD RIGHT ARM  Result Value Ref Range Status   Specimen Description BLOOD RIGHT ARM  Final   Special Requests   Final    BOTTLES DRAWN AEROBIC AND ANAEROBIC Blood Culture results may not be optimal due to an inadequate volume of blood received in culture bottles   Culture    Final    NO GROWTH 5 DAYS Performed at [facility] Lab, [address] [place], [place] [address]    Report Status 02/04/2023 FINAL  Final  Culture, blood (Routine X 2) w Reflex to ID Panel     Status: None   Collection Time: 01/30/23  4:46 PM   Specimen: BLOOD RIGHT ARM  Result Value Ref Range Status   Specimen Description BLOOD RIGHT ARM  Final   Special Requests   Final    BOTTLES DRAWN AEROBIC AND ANAEROBIC Blood Culture results may not be optimal due to an inadequate volume of blood received in culture bottles   Culture   Final    NO GROWTH 5 DAYS Performed at [facility], [address] [place], [place] [address]    Report Status 02/04/2023 FINAL  Final  Surgical pcr screen     Status: None   Collection Time: 02/02/23  3:00 PM   Specimen: Nasal Mucosa; Nasal Swab  Result Value Ref Range Status   MRSA, PCR NEGATIVE NEGATIVE Final   Staphylococcus aureus NEGATIVE NEGATIVE Final    Comment: (NOTE) The Xpert SA Assay (FDA approved for NASAL specimens in patients 13 years of age and older), is one component of a comprehensive surveillance program. It is not intended to diagnose infection nor to guide or monitor treatment. Performed at [facility], [address], [place], [place] [address]   Surgical PCR screen     Status: None   Collection Time: 02/03/23  4:57 AM   Specimen: Nasal Mucosa; Nasal Swab  Result Value Ref Range Status   MRSA, PCR NEGATIVE NEGATIVE Final   Staphylococcus aureus NEGATIVE NEGATIVE Final    Comment: (NOTE) The Xpert SA Assay (FDA approved for NASAL specimens in patients 32 years of age and older), is one component of a comprehensive surveillance program. It is not intended to diagnose infection nor to guide or monitor treatment. Performed at [facility], [address] [place], [place] [address]   Aerobic/Anaerobic Culture w Gram Stain (surgical/deep wound)     Status: None   Collection Time: 02/03/23  3:45 PM    Specimen: PATH Benign ortho; Tissue  Result Value Ref Range Status   Specimen Description TISSUE  Final   Special Requests LEFT FOOT ABSCESS TISSUE  Final   Gram Stain   Final    RARE WBC PRESENT,BOTH PMN AND MONONUCLEAR NO ORGANISMS SEEN    Culture   Final    No growth aerobically or anaerobically. Performed at [facility], [address], [place], [place] [address]    Report Status 02/10/2023 FINAL  Final    ________________________________________________________________  Arterial ***Venous  Blood Gas result:  pH *** pCO2 ***; pO2 ***;     %O2 Sat ***.  ABG    Component Value Date/Time   PHART 7.332 (L) 04/03/2020 2009   PCO2ART 36.8 04/03/2020 2009   PO2ART 87 04/03/2020 2009   HCO3 19.7 (L) 04/03/2020 2009   TCO2 23 03/03/2021 0850   ACIDBASEDEF 6.0 (H) 04/03/2020 2009   O2SAT 67.0 04/05/2020 0210       __________________________________________________________ No results for input(s): NA, K, CO2, GLUCOSE, BUN, CREATININE, CALCIUM , MG, PHOS in the last 168 hours.  Cr  * stable,  Up from baseline see below Lab Results  Component Value Date   CREATININE 1.54 (H) 11/04/2023   CREATININE 1.30 (H) 02/04/2023   CREATININE 1.39 (H) 02/03/2023    No results for input(s): AST, ALT, ALKPHOS, BILITOT, PROT, ALBUMIN  in the last 168 hours. Lab Results  Component Value Date   CALCIUM  9.4 11/04/2023   PHOS 2.7 02/01/2023          Plt: Lab Results  Component Value Date   PLT 142 (L) 11/04/2023        No results for input(s): WBC, NEUTROABS, HGB, HCT, MCV, PLT in the last 168 hours.  HG/HCT * stable,  Down *Up from baseline see below    Component Value Date/Time   HGB 15.6 11/04/2023 1103   HCT 46.1 11/04/2023 1103   MCV 98 (H) 11/04/2023 1103      No results for input(s): LIPASE, AMYLASE in the last 168 hours. No results for input(s): AMMONIA in the last 168 hours.     _______________________________________________ Hospitalist was called for admission for n/vD   The following Work up has been ordered so far:  Orders Placed This Encounter  Procedures   EKG   EKG     OTHER Significant initial  Findings:  labs showing:     DM  labs:  HbA1C: Recent Labs    01/30/23 1646 11/09/23 1117  HGBA1C 7.6* 6.6*       CBG (last 3)  No results for input(s): GLUCAP in the last 72 hours.        Cultures:    Component Value Date/Time   SDES TISSUE 02/03/2023 1545   SPECREQUEST LEFT FOOT ABSCESS TISSUE 02/03/2023 1545   CULT  02/03/2023 1545    No growth aerobically or anaerobically. Performed at [facility] Lab, [address], [place], [place] [address]    REPTSTATUS 02/10/2023 FINAL 02/03/2023 1545     Radiological Exams on Admission: No results found. _______________________________________________________________________________________________________ Latest  Blood pressure 112/74, pulse 74, temperature 98.1 F (36.7 C), temperature source Oral, resp. rate 18, weight 65.1 kg, SpO2 100%.   Vitals  labs and radiology finding personally reviewed  Review of Systems:    Pertinent positives include: nausea, vomiting, diarrhea chest pain,  Constitutional:  No weight loss, night sweats, Fevers, chills, fatigue, weight loss  HEENT:  No headaches, Difficulty swallowing,Tooth/dental problems,Sore throat,  No sneezing, itching, ear ache, nasal congestion, post nasal drip,  Cardio-vascular:  No Orthopnea, PND, anasarca, dizziness, palpitations.no Bilateral lower extremity swelling  GI:  No heartburn, indigestion, abdominal pain, , change in bowel habits, loss of appetite, melena, blood in stool, hematemesis Resp:  no shortness of breath at rest. No dyspnea on exertion, No excess mucus, no productive cough, No non-productive cough, No coughing up of blood.No change in color of mucus.No wheezing. Skin:  no rash or lesions. No  jaundice GU:  no dysuria, change in color of urine, no urgency or frequency. No straining to urinate.  No flank pain.  Musculoskeletal:  No joint  pain or no joint swelling. No decreased range of motion. No back pain.  Psych:  No change in mood or affect. No depression or anxiety. No memory loss.  Neuro: no localizing neurological complaints, no tingling, no weakness, no double vision, no gait abnormality, no slurred speech, no confusion  All systems reviewed and apart from HOPI all are negative _______________________________________________________________________________________________ Past Medical History:   Past Medical History:  Diagnosis Date   Blind right eye    Carotid artery disease    04/01/20: 60-79% RICA stenosis, 1-39% LICA stenosis   Chronic kidney disease    ckd stage 3 a per dr hobart ronco 02-05-2021 epic   Coronary artery disease    NSTEMI s/p CABG in 03/2020 (LIMA-->LAD/diag, L radial-->OM1/OM3); b.   DM type 2    Glaucoma of right eye, unspecified glaucoma type    HFrEF (heart failure with reduced ejection fraction) (HCC)    Echo 6/22: EF 45, global HK, mild MR, RVSP 31.9   Hypertension    Ischemic cardiomyopathy    PAD (peripheral artery disease)    occluded right axillary artery 03/29/20       Past Surgical History:  Procedure Laterality Date   AMPUTATION Right 04/24/2020   Procedure: RIGHT 2ND AND 3RD TOE AMPUTATION;  Surgeon: Harden Jerona GAILS, MD;  Location: [facility] OR;  Service: Orthopedics;  Laterality: Right;   AMPUTATION Right 10/22/2021   Procedure: RIGHT TRANSMETATARSAL AMPUTATION;  Surgeon: Harden Jerona GAILS, MD;  Location: [facility] OR;  Service: Orthopedics;  Laterality: Right;   AMPUTATION Left 09/04/2022   Procedure: LEFT 1ST RAY AMPUTATION;  Surgeon: Harden Jerona GAILS, MD;  Location: [facility] OR;  Service: Orthopedics;  Laterality: Left;   APPLICATION OF WOUND VAC Right 10/22/2021   Procedure: APPLICATION OF WOUND VAC;  Surgeon: Harden Jerona GAILS, MD;  Location: [facility] OR;   Service: Orthopedics;  Laterality: Right;   CENTRAL VENOUS CATHETER INSERTION Left 04/03/2020   Procedure: INSERTION OF FEMORAL ARTERIAL LINE;  Surgeon: German Bartlett PEDLAR, MD;  Location: [facility] OR;  Service: Open Heart Surgery;  Laterality: Left;   CORONARY ARTERY BYPASS GRAFT N/A 04/03/2020   Procedure: CORONARY ARTERY BYPASS GRAFTING (CABG) TIMES FOUR USING LEFT INTERNAL MAMMARY ARTERY AND LEFT RADIAL ARTERY.;  Surgeon: German Bartlett PEDLAR, MD;  Location: [facility] OR;  Service: Open Heart Surgery;  Laterality: N/A;   CORONARY PRESSURE/FFR WITH 3D MAPPING N/A 03/29/2020   Procedure: Coronary Pressure Wire/FFR w/3D Mapping;  Surgeon: Claudene Victory ORN, MD;  Location: [facility] INVASIVE CV LAB;  Service: Cardiovascular;  Laterality: N/A;   EYE SURGERY Right 01/21/2021   right eye cataract lens replacement surgery   I & D EXTREMITY Left 07/01/2022   Procedure: LEFT FOOT DEBRIDEMENT AND FOREIGN BODY REMOVAL;  Surgeon: Harden Jerona GAILS, MD;  Location: [facility] OR;  Service: Orthopedics;  Laterality: Left;   IR THORACENTESIS ASP PLEURAL SPACE W/IMG GUIDE  05/03/2020   IR THORACENTESIS ASP PLEURAL SPACE W/IMG GUIDE  05/16/2020   LEFT HEART CATH AND CORONARY ANGIOGRAPHY N/A 03/29/2020   Procedure: LEFT HEART CATH AND CORONARY ANGIOGRAPHY;  Surgeon: Claudene Victory ORN, MD;  Location: [facility];  Service: Cardiovascular;  Laterality: N/A;   METATARSAL HEAD EXCISION Left 02/03/2023   Procedure: LEFT FOOT EXCISION MEDIAL CUNEIFORM;  Surgeon: Harden Jerona GAILS, MD;  Location: [facility] OR;  Service: Orthopedics;  Laterality: Left;   PENILE PROSTHESIS IMPLANT N/A 03/03/2021   Procedure: PENILE PROTHESIS INFLATABLE COLOPLAST;  Surgeon: Carolee Sherwood JONETTA DOUGLAS, MD;  Location: [facility];  Service: Urology;  Laterality: N/A;  REQUESTING 2 HRS   RADIAL ARTERY HARVEST Left 04/03/2020   Procedure: LEFT RADIAL ARTERY HARVEST;  Surgeon: German Bartlett PEDLAR, MD;  Location: [facility] OR;  Service: Open Heart Surgery;  Laterality: Left;   TEE WITHOUT  CARDIOVERSION N/A 04/03/2020   Procedure: TRANSESOPHAGEAL ECHOCARDIOGRAM (TEE);  Surgeon: German Bartlett PEDLAR, MD;  Location: [facility] OR;  Service: Open Heart Surgery;  Laterality: N/A;   UPPER EXTREMITY ANGIOGRAPHY  03/29/2020   Procedure: Upper Extremity Angiography;  Surgeon: Claudene Victory ORN, MD;  Location: [facility] INVASIVE CV LAB;  Service: Cardiovascular;;  rt.  arm    Social History:  Ambulatory  independently   reports that he has been smoking cigarettes. He has a 0.5 pack-year smoking history. He has been exposed to tobacco smoke. He has never used smokeless tobacco. He reports that he does not currently use alcohol after a past usage of about 1.0 standard drink of alcohol per week. He reports that he does not currently use drugs after having used the following drugs: Marijuana. Family History:  Family History  Problem Relation Age of Onset   Diabetes Mother    ______________________________________________________________________________________________ Allergies: Allergies  Allergen Reactions   Vancomycin       01/30/2023 had discrete hives, only in the arm where he was getting the infusion. Had brisk response to Benadryl . We then followed with a reduced rate of vancomycin , and he tolerated the entire dose with no further rash.        Prior to Admission medications   Medication Sig Start Date End Date Taking? Authorizing Provider  aspirin  325 MG tablet Take 162 mg by mouth in the morning. 04/07/20   [provider]  atorvastatin  (LIPITOR ) 80 MG tablet TAKE 1 TABLET BY MOUTH EVERY DAY 11/08/23   Lelon Hamilton T, PA-C  blood glucose meter kit and supplies by Other route as directed. Dispense based on patient and insurance preference. Use up to 2x times daily as directed. (FOR ICD-10 E10.9, E11.9).    [provider]  brimonidine  (ALPHAGAN ) 0.2 % ophthalmic solution Place 1 drop into the left eye 2 (two) times daily. 12/18/22     dorzolamide -timolol  (COSOPT ) 2-0.5 %  ophthalmic solution Place 1 drop into the left eye 2 (two) times daily. 12/18/22     empagliflozin  (JARDIANCE ) 25 MG TABS tablet Take 1 tablet (25 mg total) by mouth daily. 11/09/23   Shamleffer, Ibtehal Jaralla, MD  furosemide  (LASIX ) 20 MG tablet TAKE 1 TABLET BY MOUTH 3 TIMES  WEEKLY ON MONDAYS, WEDNESDAYS,  AND FRIDAYS 10/04/23   Monge, Damien BROCKS, NP  losartan  (COZAAR ) 25 MG tablet TAKE 1/2 TABLET BY MOUTH DAILY. 11/19/23   Wyn Jackee VEAR Mickey., NP  metoprolol  succinate (TOPROL -XL) 25 MG 24 hr tablet TAKE 1/2 TABLET BY MOUTH EVERY DAY 11/26/23   Dick, Ernest H Jr., NP  pentoxifylline  (TRENTAL ) 400 MG CR tablet TAKE 1 TABLET BY MOUTH 3 TIMES DAILY WITH MEALS. Patient not taking: Reported on 11/09/2023 03/31/23   Harden Jerona GAILS, MD  spironolactone  (ALDACTONE ) 25 MG tablet Take 0.5 tablets (12.5 mg total) by mouth daily. 11/26/23   Wyn Jackee VEAR Mickey., NP    ___________________________________________________________________________________________________ Physical Exam:    12/30/2023    6:00 PM 12/30/2023    5:50 PM 11/09/2023   11:09 AM  Vitals with BMI  Height   5' 9  Weight  143 lbs 10 oz 156 lbs  BMI   23.03  Systolic 112  132  Diastolic 74  80  Pulse 74  76     1. General:  in No Acute distress   Chronically ill -appearing 2. Psychological: Alert and  Oriented 3. Head/ENT:   Dry Mucous Membranes                          Head Non traumatic, neck supple                    Poor Dentition 4. SKIN: normal *** decreased Skin turgor,  Skin clean Dry and intact no rash    5. Heart: Regular rate and rhythm no*** Murmur, no Rub or gallop 6. Lungs: ***Clear to auscultation bilaterally, no wheezes or crackles   7. Abdomen: Soft, ***non-tender, Non distended *** obese ***bowel sounds present 8. Lower extremities: no clubbing, cyanosis, no ***edema 9. Neurologically Grossly intact, moving all 4 extremities equally *** strength 5 out of 5 in all 4 extremities cranial nerves II through XII  intact 10. MSK: Normal range of motion    Chart has been reviewed  ______________________________________________________________________________________________  Assessment/Plan  ***  Admitted for  N/V/D chest pain    Present on Admission: **None**     No problem-specific Assessment & Plan notes found for this encounter.    Other plan as per orders.  DVT prophylaxis:  SCD       Code Status:    Code Status: Prior FULL CODE   as per patient  I had personally discussed CODE STATUS with patient   ACP   none    Family Communication:   Family not at  Bedside    Diet heart healthy/diabetic   Disposition Plan:     To home once workup is complete and patient is stable   Following barriers for discharge:                             Chest pain  work up is complete                            Electrolytes corrected                                                         Will need consultants to evaluate patient prior to discharge                                               Consults called: ***  NONE   Admission status:  ED Disposition     None        Obs***  ***  inpatient     I Expect 2 midnight stay secondary to severity of patient's current illness need for inpatient interventions justified by the following: ***hemodynamic instability despite optimal treatment (tachycardia *hypotension * tachypnea *hypoxia, hypercapnia) *** Severe lab/radiological/exam abnormalities including:    There are no diagnoses linked to this encounter. and extensive comorbidities including: *substance abuse  *Chronic pain *DM2  * CHF * CAD  * COPD/asthma *Morbid Obesity * CKD *dementia *liver disease *history of stroke with residual deficits *  malignancy, *  sickle cell disease  History of amputation Chronic anticoagulation  That are currently affecting medical management.   I expect  patient to be hospitalized for 2 midnights requiring inpatient medical  care.  Patient is at high risk for adverse outcome (such as loss of life or disability) if not treated.  Indication for inpatient stay as follows:  Severe change from baseline regarding mental status Hemodynamic instability despite maximal medical therapy,  severe pain requiring acute inpatient management,  inability to maintain oral hydration   persistent chest pain despite medical management Need for operative/procedural  intervention New or worsening hypoxia ongoing suicidal ideations   Need for IV antibiotics, IV fluids,, IV pain medications, IV anticoagulation,  IV rate controling medications, IV antihypertensives need for biPAP Frequent labs    Level of care     tele  For  24H    Marvelous Woolford 12/30/2023, 7:39 PM ***  [facility]     after 2 AM please page floor coverage   If 7AM-7PM, please contact the day team taking care of the patient using [email or web address]

## 2023-12-30 NOTE — Assessment & Plan Note (Signed)
-  chronic avoid nephrotoxic medications such as NSAIDs, Vanco Zosyn combo,  avoid hypotension, continue to follow renal function

## 2023-12-30 NOTE — Assessment & Plan Note (Signed)
-   currently appears to be slightly on the dry side, hold home diuretics for tonight and restart when appears euvolemic, carefuly follow fluid status and Cr  

## 2023-12-30 NOTE — Assessment & Plan Note (Signed)
 Allow permissive htn ?

## 2023-12-30 NOTE — Plan of Care (Signed)
 Hospital Medicine Transfer Accept Note Patient Name/Age: Curtis Watkins / 68 y.o. MRN: [id number] Admission Date: (Not on file)  Once successfully transferred to the appropriate floor, [facility] will assume care for the patient above.  A/P: 21M h/o 4 vessel CABG, HFpEF (EF 45%), PAD, HTN, HLD, and CKD3 p/w cp and emesis and found to have elevated troponins c/w NSTEMI. OSH EDP administered ASA, and pt deferred hep gtt. Will need Cards eval on arrival.  Marsha Ada, MD Attending Physician Division of [facility] December 30, 2023 1:55 PM

## 2023-12-30 NOTE — Assessment & Plan Note (Signed)
 Continue lipitor  80 mg po q day, toprol  12.5 mg and Plavix 

## 2023-12-30 NOTE — ED Provider Notes (Signed)
 [facility] Emergency Department Provider Note    ED Clinical Impression   Final diagnoses:  Nausea and vomiting, unspecified vomiting type (Primary)    ED Course    6:58 AM Initial suspicion is likely gastroenteritis with hyperactive bowel sounds and somewhat loose stool, but with the indigestion type pain in his chest and significant cardiac risk factors, ACS needs to remain high on our differential diagnosis.  His EKG is overall reassuring, I do suspect limb lead reversal, but it was a very difficult EKG to obtain.  7:05 AM patient says he is feeling a little better after the Reglan was given.  The Reglan was given before the EKG so we do need to keep in mind his prolonged QTc.  I have also written for Protonix .  Will do a troponin.  Initial labs show creatinine of 1.8.  Looking in care everywhere I do see a 1.6 from 01/31/2023 but most of his labs including 11/04/2023 have been a little better, his creatinine September 11 was 1.54.  I am hoping he continues to feel better.  Will sign patient over to Dr. Arley Hora at 7:30 AM.   History   Chief Complaint  Emesis   HPI   Please note, this note was created using voice recognition technology.  I have made efforts to catch any phonetic/typographical errors but these may not always be found. Such errors, especially pronoun confusion, do not reflect on the standard of medical care.   Curtis Watkins is a 68 y.o. 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  6:35 AM nursing asked me to see this patient because they cannot get an EKG.  Patient is in too much distress moaning and moving and dry heaving.  Patient tells me this symptom started maybe 3 hours ago with indigestion in his chest, then nausea and vomiting.  No hematemesis, just brown emesis.  No abdominal pain.  Was not  having any shortness of breath but at this moment he says he is feeling short of breath.  Diaphoretic only when he is vomiting.  Says the pain is gone at this time and he says it was not like a pain.  The pain did not radiate to his neck/jaw/arm or back.  Cannot really describe the pain, just says it feels like an acid reflux pain.  Says the his bowel movements have been mostly soft brown stool, not exactly diarrhea.  Urinating normally.  No back pain, no neck pain.  Says he was feeling perfectly fine when he went to bed.  Has been having some chills.  Says he feels like he is having a panic attack.  SH: He smokes marijuana and cigarettes, occasional alcohol but not on a regular basis, did not have any alcohol last night.  No other drugs.   Available old records in [place] were reviewed for this visit.   Past Medical History[1]  Past Surgical History[2]  No current facility-administered medications for this encounter.  Current Outpatient Medications:  .  atorvastatin  (LIPITOR ) 80 MG tablet, Take 1 tablet (80 mg total) by mouth daily., Disp: , Rfl:  .  clopidogrel  (PLAVIX ) 75 mg tablet, Take 1 tablet (75 mg total) by mouth daily., Disp: , Rfl:  .  empagliflozin  (JARDIANCE ) 25 mg tablet, Take 1 tablet (25 mg total) by mouth daily., Disp: , Rfl:  .  furosemide  (LASIX ) 20 MG tablet, Take 1 tablet (20 mg total) by mouth daily., Disp: , Rfl:  .  hydroCHLOROthiazide (HYDRODIURIL) 25 MG tablet, Take 1 tablet (25 mg total) by mouth daily., Disp: , Rfl:  .  losartan  (COZAAR ) 25 MG tablet, Take 0.5 tablets (12.5 mg total) by mouth daily., Disp: , Rfl:  .  metoPROLOL  succinate (TOPROL -XL) 25 MG 24 hr tablet, Take 1 tablet (25 mg total) by mouth daily., Disp: , Rfl:  .  spironolactone  (ALDACTONE ) 25 MG tablet, Take 1 tablet (25 mg total) by mouth daily., Disp: , Rfl:   Allergies Vancomycin  analogues  Family History[3]  Social History Short Social History[4]  Review of Systems Review of Systems   HENT:  Negative for rhinorrhea and sore throat.   Respiratory:  Positive for cough (Chronic and baseline).   Neurological:  Negative for headaches.    Physical Exam   Vital Signs BP 118/98   Pulse 69   Temp 36.3 C (97.3 F) (Oral)   Resp 20   SpO2 100%   Physical Exam Physical Exam Vitals and nursing note reviewed.  Constitutional:      General: He is in acute distress (I had to be very direct with patient to get him to cooperate so we can make sure he is not having an MI, at this point he was able to calm down).     Appearance: He is normal weight. He is ill-appearing and diaphoretic.  HENT:     Mouth/Throat:     Mouth: Mucous membranes are moist.  Eyes:     General: No scleral icterus.    Conjunctiva/sclera: Conjunctivae normal.     Comments: Proptosis, 1 to 2 mm pupils  Cardiovascular:     Rate and Rhythm: Normal rate and regular rhythm.     Heart sounds: Normal heart sounds.     Comments: Good pedal pulses but left radial pulses difficult to palpate.  He says that they always have trouble feeling this.  The right radial pulse was okay. Pulmonary:     Effort: Pulmonary effort is normal.     Breath sounds: Normal breath sounds.  Abdominal:     Comments: Hyperactive bowel sounds.  Soft, nontender.  Musculoskeletal:        General: Normal range of motion.     Cervical back: Normal range of motion and neck supple.     Right lower leg: No edema.     Left lower leg: No edema.  Skin:    General: Skin is warm.  Neurological:     General: No focal deficit present.     Mental Status: He is alert and oriented to person, place, and time.  Psychiatric:     Comments: Anxious     Pertinent EKG, Labs, and Radiology  EKG performed at 6:38 AM shows sinus rhythm at 75 bpm.  Large deflection in lead I suspect lead limb reversal.  QTc 491 ms otherwise normal intervals.  Compared to the prior tracing 01/30/2023 the axis is very different.  Results for orders placed or performed  during the hospital encounter of 12/30/23  Comprehensive Metabolic Panel  Result Value Ref Range   Sodium 140 135 - 145 mmol/L   Potassium 3.7 3.5 - 5.0 mmol/L   Chloride 109 (H) 98 - 107 mmol/L   CO2 18.0 (L) 22.0 - 32.0 mmol/L   Anion Gap 13 5 - 14 mmol/L   BUN 40 (H) 7 - 21 mg/dL   Creatinine 8.19 (H) 9.29 -  1.30 mg/dL   BUN/Creatinine Ratio 22    eGFR CKD-EPI (2021) Male 40 (L) >=60 mL/min/1.28m2   Glucose 171 (H) 74 - 106 mg/dL   Calcium  9.4 8.5 - 10.2 mg/dL   Albumin  4.9 3.4 - 5.0 g/dL   Total Protein 8.5 (H) 6.5 - 8.3 g/dL   Total Bilirubin 1.1 0.1 - 1.2 mg/dL   AST 22 19 - 55 U/L   ALT 15 <50 U/L   Alkaline Phosphatase 98 38 - 126 U/L  Lipase Level  Result Value Ref Range   Lipase 173 44 - 232 U/L  ECG 12 Lead  Result Value Ref Range   EKG Systolic BP  mmHg   EKG Diastolic BP  mmHg   EKG Ventricular Rate 75 BPM   EKG Atrial Rate 75 BPM   EKG P-R Interval 128 ms   EKG QRS Duration 84 ms   EKG Q-T Interval 440 ms   EKG QTC Calculation 491 ms   EKG Calculated P Axis 97 degrees   EKG Calculated R Axis 118 degrees   EKG Calculated T Axis 135 degrees   QTC Fredericia 473 ms  CBC w/ Differential  Result Value Ref Range   WBC 9.0 3.6 - 11.2 10*9/L   RBC 4.93 4.26 - 5.60 10*12/L   HGB 16.3 12.9 - 16.5 g/dL   HCT 53.0 60.9 - 51.9 %   MCV 95.2 77.6 - 95.7 fL   MCH 33.1 (H) 25.9 - 32.4 pg   MCHC 34.8 32.0 - 36.0 g/dL   RDW 86.4 87.7 - 84.7 %   MPV 7.3 6.8 - 10.7 fL   Platelet 187 150 - 450 10*9/L   nRBC 0 <=4 /100 WBCs   Neutrophils % 69.8 %   Lymphocytes % 21.0 %   Monocytes % 8.2 %   Eosinophils % 0.4 %   Basophils % 0.6 %   Absolute Neutrophils 6.3 1.8 - 7.8 10*9/L   Absolute Lymphocytes 1.9 1.1 - 3.6 10*9/L   Absolute Monocytes 0.7 0.3 - 0.8 10*9/L   Absolute Eosinophils 0.0 0.0 - 0.5 10*9/L   Absolute Basophils 0.1 0.0 - 0.1 10*9/L            [1] Past Medical History: Diagnosis Date  . Diabetes mellitus (CMS-HCC)   [2] No past surgical  history on file. [3] History reviewed. No pertinent family history. [4] Social History Tobacco Use  . Smoking status: Every Day    Types: Cigarettes  Substance Use Topics  . Alcohol use: Yes    Comment: occassional  . Drug use: Yes    Types: Marijuana   Perla Ozell Hamilton, MD 12/30/23 [phone]

## 2023-12-31 ENCOUNTER — Observation Stay (HOSPITAL_COMMUNITY)

## 2023-12-31 DIAGNOSIS — N1831 Chronic kidney disease, stage 3a: Secondary | ICD-10-CM | POA: Diagnosis not present

## 2023-12-31 DIAGNOSIS — R079 Chest pain, unspecified: Secondary | ICD-10-CM

## 2023-12-31 DIAGNOSIS — K529 Noninfective gastroenteritis and colitis, unspecified: Secondary | ICD-10-CM | POA: Diagnosis present

## 2023-12-31 DIAGNOSIS — R112 Nausea with vomiting, unspecified: Secondary | ICD-10-CM | POA: Diagnosis not present

## 2023-12-31 DIAGNOSIS — I214 Non-ST elevation (NSTEMI) myocardial infarction: Secondary | ICD-10-CM | POA: Diagnosis not present

## 2023-12-31 DIAGNOSIS — N1832 Chronic kidney disease, stage 3b: Secondary | ICD-10-CM | POA: Diagnosis not present

## 2023-12-31 DIAGNOSIS — E1122 Type 2 diabetes mellitus with diabetic chronic kidney disease: Secondary | ICD-10-CM | POA: Diagnosis not present

## 2023-12-31 DIAGNOSIS — R9431 Abnormal electrocardiogram [ECG] [EKG]: Secondary | ICD-10-CM | POA: Diagnosis present

## 2023-12-31 LAB — COMPREHENSIVE METABOLIC PANEL WITH GFR
ALT: 9 U/L (ref 0–44)
AST: 15 U/L (ref 15–41)
Albumin: 3.4 g/dL — ABNORMAL LOW (ref 3.5–5.0)
Alkaline Phosphatase: 64 U/L (ref 38–126)
Anion gap: 12 (ref 5–15)
BUN: 32 mg/dL — ABNORMAL HIGH (ref 8–23)
CO2: 20 mmol/L — ABNORMAL LOW (ref 22–32)
Calcium: 8.7 mg/dL — ABNORMAL LOW (ref 8.9–10.3)
Chloride: 105 mmol/L (ref 98–111)
Creatinine, Ser: 1.6 mg/dL — ABNORMAL HIGH (ref 0.61–1.24)
GFR, Estimated: 47 mL/min — ABNORMAL LOW (ref >60)
Glucose, Bld: 97 mg/dL (ref 70–99)
Potassium: 3.7 mmol/L (ref 3.5–5.1)
Sodium: 137 mmol/L (ref 135–145)
Total Bilirubin: 1 mg/dL (ref 0.0–1.2)
Total Protein: 6.7 g/dL (ref 6.5–8.1)

## 2023-12-31 LAB — CREATININE, URINE, RANDOM: Creatinine, Urine: 101 mg/dL

## 2023-12-31 LAB — ECHOCARDIOGRAM COMPLETE
AR max vel: 2.79 cm2
AV Area VTI: 2.69 cm2
AV Area mean vel: 3.16 cm2
AV Mean grad: 2 mmHg
AV Peak grad: 4.8 mmHg
Ao pk vel: 1.1 m/s
Area-P 1/2: 2.47 cm2
Calc EF: 75.1 %
Height: 69 in
MV VTI: 2.71 cm2
S' Lateral: 2.9 cm
Single Plane A2C EF: 76.2 %
Single Plane A4C EF: 70.1 %
Weight: 2296.31 [oz_av]

## 2023-12-31 LAB — CBC
HCT: 40.6 % (ref 39.0–52.0)
Hemoglobin: 14.2 g/dL (ref 13.0–17.0)
MCH: 32.7 pg (ref 26.0–34.0)
MCHC: 35 g/dL (ref 30.0–36.0)
MCV: 93.5 fL (ref 80.0–100.0)
Platelets: 157 K/uL (ref 150–400)
RBC: 4.34 MIL/uL (ref 4.22–5.81)
RDW: 13 % (ref 11.5–15.5)
WBC: 9.7 K/uL (ref 4.0–10.5)
nRBC: 0 % (ref 0.0–0.2)

## 2023-12-31 LAB — GLUCOSE, CAPILLARY
Glucose-Capillary: 111 mg/dL — ABNORMAL HIGH (ref 70–99)
Glucose-Capillary: 113 mg/dL — ABNORMAL HIGH (ref 70–99)
Glucose-Capillary: 135 mg/dL — ABNORMAL HIGH (ref 70–99)
Glucose-Capillary: 148 mg/dL — ABNORMAL HIGH (ref 70–99)
Glucose-Capillary: 156 mg/dL — ABNORMAL HIGH (ref 70–99)
Glucose-Capillary: 160 mg/dL — ABNORMAL HIGH (ref 70–99)

## 2023-12-31 LAB — HIV ANTIBODY (ROUTINE TESTING W REFLEX): HIV Screen 4th Generation wRfx: NONREACTIVE

## 2023-12-31 LAB — PHOSPHORUS: Phosphorus: 2.9 mg/dL (ref 2.5–4.6)

## 2023-12-31 LAB — TROPONIN I (HIGH SENSITIVITY)
Troponin I (High Sensitivity): 246 ng/L (ref <18)
Troponin I (High Sensitivity): 246 ng/L (ref <18)
Troponin I (High Sensitivity): 253 ng/L (ref <18)

## 2023-12-31 LAB — SODIUM, URINE, RANDOM: Sodium, Ur: 72 mmol/L

## 2023-12-31 LAB — MAGNESIUM: Magnesium: 2.1 mg/dL (ref 1.7–2.4)

## 2023-12-31 MED ORDER — HEPARIN (PORCINE) 25000 UT/250ML-% IV SOLN
800.0000 [IU]/h | INTRAVENOUS | Status: DC
  Administered 2023-12-31: 800 [IU]/h via INTRAVENOUS
  Filled 2023-12-31: qty 250

## 2023-12-31 MED ORDER — HEPARIN BOLUS VIA INFUSION
4000.0000 [IU] | Freq: Once | INTRAVENOUS | Status: AC
  Administered 2023-12-31: 4000 [IU] via INTRAVENOUS
  Filled 2023-12-31: qty 4000

## 2023-12-31 MED ORDER — LOSARTAN POTASSIUM 25 MG PO TABS
12.5000 mg | ORAL_TABLET | Freq: Every day | ORAL | Status: DC
  Administered 2023-12-31 – 2024-01-01 (×2): 12.5 mg via ORAL
  Filled 2023-12-31 (×2): qty 1

## 2023-12-31 MED ORDER — SPIRONOLACTONE 12.5 MG HALF TABLET
12.5000 mg | ORAL_TABLET | Freq: Every day | ORAL | Status: DC
  Administered 2023-12-31 – 2024-01-01 (×2): 12.5 mg via ORAL
  Filled 2023-12-31 (×2): qty 1

## 2023-12-31 NOTE — Progress Notes (Signed)
  Echocardiogram 2D Echocardiogram has been performed.  Norleen ORN [facility] 12/31/2023, 9:14 AM

## 2023-12-31 NOTE — Progress Notes (Signed)
 Patient's family has brought him his upper partial dentures.

## 2023-12-31 NOTE — Progress Notes (Addendum)
 PHARMACY - ANTICOAGULATION CONSULT NOTE  Pharmacy Consult for heparin  Indication: chest pain/ACS  Patient Measurements: Height: 5' 9 (175.3 cm) Weight: 65.1 kg (143 lb 8.3 oz) IBW/kg (Calculated) : 70.7 HEPARIN  DW (KG): 65.1  Vital Signs: Temp: 98.9 F (37.2 C) (11/07 0011) Temp Source: Oral (11/07 0011) BP: 109/66 (11/07 0600) Pulse Rate: 66 (11/07 0600)  Labs: Recent Labs    12/30/23 1959 12/31/23 0404  HGB 14.7 14.2  HCT 42.3 40.6  PLT 181 157  CREATININE 1.56* 1.60*  CKTOTAL 100  --   TROPONINIHS  --  246*    Estimated Creatinine Clearance: 40.7 mL/min (A) (by C-G formula based on SCr of 1.6 mg/dL (H)).   Medical History: Past Medical History:  Diagnosis Date   Blind right eye    Carotid artery disease    04/01/20: 60-79% RICA stenosis, 1-39% LICA stenosis   Chronic kidney disease    ckd stage 3 a per dr hobart ronco 02-05-2021 epic   Coronary artery disease    NSTEMI s/p CABG in 03/2020 (LIMA-->LAD/diag, L radial-->OM1/OM3); b.   DM type 2    Glaucoma of right eye, unspecified glaucoma type    HFrEF (heart failure with reduced ejection fraction) (HCC)    Echo 6/22: EF 45, global HK, mild MR, RVSP 31.9   Hypertension    Ischemic cardiomyopathy    PAD (peripheral artery disease)    occluded right axillary artery 03/29/20    Assessment: 9 yoM presented with chest pain, N/V. Pharmacy consulted to dose heparin  for ACS. PMH includes CAD (s/p 4vCABG in 2022), HFmrEF (LVEF 45%), PAD, HTN, HLD.  -CBC WNL -Trop 246 -No oral anticoagulation PTA  Goal of Therapy:  Heparin  level 0.3-0.7 units/ml Monitor platelets by anticoagulation protocol: Yes   Plan:  Give 4000 units bolus x 1 Start heparin  infusion at 800 units/hr Check anti-Xa level in 8 hours and daily while on heparin  Continue to monitor H&H and platelets F/u cards consult/eval  Lynwood Poplar, PharmD, BCPS Clinical Pharmacist 12/31/2023 6:35 AM

## 2023-12-31 NOTE — Progress Notes (Signed)
 PT Cancellation Note  Patient Details Name: Curtis Watkins MRN: [id number] DOB: 09-19-1955   Cancelled Treatment:    Reason Eval/Treat Not Completed: PT screened, no needs identified, will sign off.  Per OT, can screen out. No PT needs. 12/31/2023  [place] HERO., PT Acute Rehabilitation Services [phone]  (office)   Vinie GAILS [name] 12/31/2023, 2:13 PM

## 2023-12-31 NOTE — Plan of Care (Signed)
  Problem: Clinical Measurements: Goal: Ability to maintain clinical measurements within normal limits will improve Outcome: Progressing Goal: Cardiovascular complication will be avoided Outcome: Progressing   Problem: Pain Managment: Goal: General experience of comfort will improve and/or be controlled Outcome: Progressing   Problem: Safety: Goal: Ability to remain free from injury will improve Outcome: Progressing

## 2023-12-31 NOTE — Assessment & Plan Note (Signed)
-  will monitor on tele avoid QT prolonging medications, rehydrate correct electrolytes ? ?

## 2023-12-31 NOTE — Evaluation (Signed)
 Occupational Therapy Evaluation and Discharge Patient Details Name: Curtis Watkins MRN: [id number] DOB: 19-Jul-1955 Today's Date: 12/31/2023   History of Present Illness   Curtis Watkins is a 68 y.o. male presented with N/V/D, chest pain, and emesis.Midsternal indigestion-like pain generalized fatigue noted to be diaphoretic and generally weak. PHMx:CAD, PAD, HTN HLD, DM2,  CKD 3a, systolic CHF, glaucoma, tobacco abuse, CABG, blind R eye, RLE transmetatarsal amputation, LLE first ray amputation     Clinical Impressions This 68 yo male admitted with above presents to acute OT with PLOF of being independent with basic ADLs and mobility. Pt currently at his baseline for ADLs and mobility, no further OT needs and no PT needs identified--they have been made aware through secure chat. Acute OT will sign off.      Functional Status Assessment   Patient has not had a recent decline in their functional status     Equipment Recommendations    none     Recommendations for Other Services    none     Precautions/Restrictions   Precautions Precautions: None Restrictions Weight Bearing Restrictions Per Provider Order: No     Mobility Bed Mobility Overal bed mobility: Independent                  Transfers Overall transfer level: Independent Equipment used: None                      Balance Overall balance assessment: Independent                                         ADL either performed or assessed with clinical judgement   ADL Overall ADL's : Independent                                             Vision Baseline Vision/History: 1 Wears glasses Ability to See in Adequate Light: 0 Adequate Patient Visual Report: No change from baseline              Pertinent Vitals/Pain Pain Assessment Pain Assessment: No/denies pain     Extremity/Trunk Assessment Upper Extremity Assessment Upper Extremity Assessment:  Overall WFL for tasks assessed           Communication Communication Communication: No apparent difficulties   Cognition Arousal: Alert Behavior During Therapy: WFL for tasks assessed/performed Cognition: No apparent impairments                               Following commands: Intact                  Home Living Family/patient expects to be discharged to:: Private residence Living Arrangements: Children Available Help at Discharge: Family;Available 24 hours/day Type of Home: Apartment Home Access: Stairs to enter Entrance Stairs-Number of Steps: 5 Entrance Stairs-Rails: Right Home Layout: One level     Bathroom Shower/Tub: Chief Strategy Officer: Standard     Home Equipment: None          Prior Functioning/Environment Prior Level of Function : Independent/Modified Independent  OT Goals(Current goals can be found in the care plan section)   Acute Rehab OT Goals Patient Stated Goal: did not state         AM-PAC OT 6 Clicks Daily Activity     Outcome Measure Help from another person eating meals?: None Help from another person taking care of personal grooming?: None Help from another person toileting, which includes using toliet, bedpan, or urinal?: None Help from another person bathing (including washing, rinsing, drying)?: None Help from another person to put on and taking off regular upper body clothing?: None Help from another person to put on and taking off regular lower body clothing?: None 6 Click Score: 24   End of Session    Activity Tolerance: Patient tolerated treatment well Patient left: in bed;with call bell/phone within reach                   Time: 1236-1245 OT Time Calculation (min): 9 min Charges:  OT General Charges $OT Visit: 1 Visit OT Evaluation $OT Eval Low Complexity: 1 Low  Curtis Watkins OT Acute Rehabilitation Services Office [phone]    Rodgers Dorothyann Distel 12/31/2023, 12:55 PM

## 2023-12-31 NOTE — Progress Notes (Signed)
 Stool sample sent to lab for Gastrointestinal Panel by PCR and to r/o C. Diff.

## 2023-12-31 NOTE — Assessment & Plan Note (Signed)
 Nausea vomiting diarrhea currently resolved gastric panel ordered pending

## 2023-12-31 NOTE — Progress Notes (Signed)
 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 Sam last seen 11/09/2023 noted to be on Jardiance  and not taking Amaryl  Left foot wound status post left medial cuneiform excision during hospitalization 12/7-12/01/2023 Dr. Harden CABG X4----04/03/20 with HFrEF occasional hypotension HTN CKD 3B follows with Dr. Tobie of nephrology Still smoker 1 pack/day precontemplative  As below admitted with nausea vomiting chest pain  Chronology  11/04/2023 seen for annual cardiology visit-noted to be stable 11/6 evaluation [facility] for profuse nausea vomiting indigestion nausea vomiting no hematemesis no shortness of breath diaphoretic when vomiting pudding-like stool  Cardiology was discussed patient troponin found to be 246 (last troponins 03/06/2022  were 160-EKG nonsuggestive of damage pattern with PR interval 0.12 QRS axis 60 degrees hyperacute T waves with suggestion of LVH slightly prolonged QTc no damage pattern no T wave inversion to my view   Pertinent imaging/studies till date  Echocardiogram-.   Assessment  & Plan :    Nausea vomiting diarrheal illness Etiology probably foodborne disease as 8 a suspicious sandwich on 11/5 and got sick subsequently No further diarrhea able to keep down some fluids but waiting on dentures Do not suspect will need GI pathogen panel or C. difficile if does not have any further diarrhea Only symptomatic management Reported chest pain--NSTEMI on admission Previous CABG X4 2022 with HFrEF and complication of hypotension Troponin is elevated but trend is flat he is having no chest pain whatsoever I have kept him on heparin  until we get the echo and if this shows wall motion abnormalities may need to consult cardiology  formally Cardiology was informed of the patient's case by [facility] and felt this was possibly an NSTEMI-we will consult if needed See below regarding meds continue Toprol -XL 12.5 Plavix  75 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 Harden Wound looks clean outpatient follow-up CKD stage IIIb Outpatient follow-up with Dr. Tobie Still smoke  Data Reviewed today:  Sodium 137 potassium 3.7 bicarb 20 BUN/creatinine 32/1.6 LFTs relatively normal GFR 47 troponin trend in the 250s HIV nonreactive Chest abdomen pelvis x-ray no acute abnormality identified   DVT prophylaxis: Currently heparin  GTT  Status is: Observation The patient will require care spanning > 2 midnights and should be moved to inpatient because:   Monitor trends closely    Dispo/Global plan: Inpatient -if all stable expected can discharge home tomorrow  Time 30   Subjective:   Pleasant awake coherent sitting up in bed quite happy-not nervous anxious no chest pain whatsoever no arm pain no neck pain He has not had any further diarrhea    Objective + exam Vitals:   12/30/23 1750 12/30/23 1800 12/30/23 2043 12/31/23 0011  BP:  112/74 111/73 132/84  Pulse:  74 76 65  Resp:  18 20 18   Temp:  98.1 F (36.7 C) 99 F (37.2 C) 98.9 F (37.2 C)  TempSrc:  Oral Oral Oral  SpO2:  100% 98% 100%  Weight: 65.1 kg      Filed Weights   12/30/23 1750  Weight: 65.1 kg  Examination: EOMI NCAT looks younger than stated age-pleasant No JVD S1-S2 no murmur-on monitors sinus with PVC Abdomen nondistended no rebound no guarding Left foot examined stump seems clean No edema Neuro intact  Scheduled Meds:  aspirin  EC  81 mg Oral Daily   atorvastatin   80 mg Oral Daily   brimonidine   1 drop Left Eye BID   clopidogrel   75 mg  Oral Daily   dorzolamide -timolol   1 drop Left Eye BID   insulin  aspart  0-9 Units Subcutaneous Q4H   losartan   12.5 mg Oral Daily   metoprolol  succinate  12.5 mg Oral Daily   spironolactone   12.5 mg Oral Daily   Continuous Infusions:  heparin  800 Units/hr (12/31/23 0935)   acetaminophen  **OR** acetaminophen , fentaNYL  (SUBLIMAZE ) injection, HYDROcodone -acetaminophen   Jai-Gurmukh Natahlia Hoggard, MD  [facility]

## 2023-12-31 NOTE — H&P (Incomplete)
 Curtis Watkins FMW:[id number] DOB: 02-12-1956 DOA: 12/30/2023     PCP: Jaycee Greig PARAS, NP   Outpatient Specialists:  CARDS: Jackee Alberts NP Patient arrived to ER on  at  Referred by Attending Georgina Basket, MD   Patient coming from:    home Lives  With family    Chief Complaint: chest pain nausea and vomiting   HPI: Curtis Watkins is a 68 y.o. male with medical history significant of CAD, PAD, HTN HLD, DM2  CKD 3a, systolic CHF, glaucoma, tobacco abuse  Presented with  n/v/D and chest pain Patient with CP and emesis Patient has known history of PAD and CAD status post CABG Reported midsternal indigestion-like pain generalized fatigue noted to be diaphoretic and generally weak Abdomen nontender noted to have diarrhea EKG nonacute Nausea and vomiting improved with Reglan and Protonix   found to have elevated troponin trop I 0.075 ->0.09 Of note back in 2024 trop was 0.133 WBC 9.0 hg 16.3 Na 140  Bicarb 18 Cr 1.8 LFT wnl ED gave aspirin  ER provider initially discussed case with cardiology Dr. Delford who felt that chest pain was unlikely to be a ACS and recommended hospitalist admission Patient refused heparin       Reports chest pain this morning after he was vomiting felt like acid reflux  It lasted about 1 hour Reports soft stool No unusual foods  No sick contacts  No changes in medications   Denies significant ETOH intake   Does  smoke  but interested in quitting       Regarding pertinent Chronic problems:    Hyperlipidemia - on statins Lipitor  (atorvastatin )  Lipid Panel     Component Value Date/Time   CHOL 101 11/04/2023 1103   TRIG 73 11/04/2023 1103   HDL 46 11/04/2023 1103   CHOLHDL 2.2 11/04/2023 1103   CHOLHDL 2.8 03/29/2020 0600   VLDL 12 03/29/2020 0600   LDLCALC 40 11/04/2023 1103   LABVLDL 15 11/04/2023 1103     HTN on hydrochlorothiazide, cozaar , toprol  spironolactone    chronic CHF  systolic - last echo on lasix  Recent Results (from the past  43800 hours)  ECHOCARDIOGRAM COMPLETE   Collection Time: 03/06/22 10:04 AM  Result Value   BP 99/71   Single Plane A2C EF 34.0   Single Plane A4C EF 48.2   Calc EF 40.8   S' Lateral 3.50   AR max vel 2.66   AV Area VTI 2.61   AV Mean grad 2.0   AV Peak grad 4.5   Ao pk vel 1.06   Area-P 1/2 3.91   MV M vel 5.56   AV Area mean vel 2.50   MV Peak grad 123.8   Est EF 45 - 50%   Narrative      ECHOCARDIOGRAM REPORT       1. Left ventricular ejection fraction, by estimation, is 45 to 50%. The left ventricle has mildly decreased function. The left ventricle demonstrates global hypokinesis. Left ventricular diastolic parameters were normal. The average left ventricular  global longitudinal strain is -17.8 %. The global longitudinal strain is abnormal.  2. Right ventricular systolic function is normal. The right ventricular size is normal. There is normal pulmonary artery systolic pressure. The estimated right ventricular systolic pressure is 6.0 mmHg.  3. The mitral valve is grossly normal. Mild mitral valve regurgitation. No evidence of mitral stenosis.  4. The aortic valve is tricuspid. Aortic valve regurgitation is not visualized. No aortic stenosis is present.  5. The inferior vena cava is normal in size with greater than 50% respiratory variability, suggesting right atrial pressure of 3 mmHg.  Comparison(s): No significant change from prior study.           CAD  - On Aspirin , statin, betablocker, Plavix                  -  followed by cardiology                - last cardiac cath 2022    Left Anterior Descending  There is mild diffuse disease throughout the vessel.  Mid LAD lesion is 85% stenosed.    First Diagonal Branch  Vessel is small in size. There is mild disease in the vessel.    Second Diagonal Branch  There is moderate disease in the vessel.    Third Diagonal Branch  There is moderate disease in the vessel.    Left Circumflex  Prox Cx to Mid Cx lesion is 80%  stenosed.    First Obtuse Marginal Branch  1st Mrg lesion is 90% stenosed.    Right Coronary Artery  There is moderate diffuse disease throughout the vessel.    Right Posterior Descending Artery  There is moderate disease in the vessel.    Right Posterior Atrioventricular Artery  There is moderate disease in the vessel.        DM 2 -  Lab Results  Component Value Date   HGBA1C 6.6 (A) 11/09/2023   PO meds only,  jardiance     CKD stage IIIa baseline Cr 1.5 CrCl cannot be calculated (Patient's most recent lab result is older than the maximum 21 days allowed.).  Lab Results  Component Value Date   CREATININE 1.54 (H) 11/04/2023   CREATININE 1.30 (H) 02/04/2023   CREATININE 1.39 (H) 02/03/2023   Lab Results  Component Value Date   NA 142 11/04/2023   CL 105 11/04/2023   K 4.6 11/04/2023   CO2 19 (L) 11/04/2023   BUN 21 11/04/2023   CREATININE 1.54 (H) 11/04/2023   EGFR 49 (L) 11/04/2023   CALCIUM  9.4 11/04/2023   PHOS 2.7 02/01/2023   ALBUMIN  4.2 11/04/2023   GLUCOSE 108 (H) 11/04/2023        Lab Orders  No laboratory test(s) ordered today   CXR -  NON acute    Following Medications were ordered in ER: Medications - No data to display  _______________________________________________________ ER Provider Called:     Cardiology  Dr. Delford  They Recommend admit to medicine         ED Triage Vitals  Encounter Vitals Group     BP 12/30/23 1800 112/74     Girls Systolic BP Percentile --      Girls Diastolic BP Percentile --      Boys Systolic BP Percentile --      Boys Diastolic BP Percentile --      Pulse Rate 12/30/23 1800 74     Resp 12/30/23 1800 18     Temp 12/30/23 1800 98.1 F (36.7 C)     Temp Source 12/30/23 1800 Oral     SpO2 12/30/23 1800 100 %     Weight 12/30/23 1750 143 lb 9.6 oz (65.1 kg)     Height --      Head Circumference --      Peak Flow --      Pain Score 12/30/23 1800 0     Pain Loc --  Pain Education --      Exclude  from Growth Chart --   [id number]     _________________________________________ Significant initial  Findings: Abnormal Labs Reviewed  COMPREHENSIVE METABOLIC PANEL WITH GFR - Abnormal; Notable for the following components:      Result Value   CO2 20 (*)    Glucose, Bld 112 (*)    BUN 32 (*)    Creatinine, Ser 1.56 (*)    GFR, Estimated 48 (*)    All other components within normal limits  HEMOGLOBIN A1C - Abnormal; Notable for the following components:   Hgb A1c MFr Bld 6.2 (*)    All other components within normal limits  BLOOD GAS, VENOUS - Abnormal; Notable for the following components:   pCO2, Ven 34 (*)    pO2, Ven 157 (*)    Acid-base deficit 3.5 (*)    All other components within normal limits  GLUCOSE, CAPILLARY - Abnormal; Notable for the following components:   Glucose-Capillary 103 (*)    All other components within normal limits      _________________________ Troponin  ordered Cardiac Panel (last 3 results) Recent Labs    12/30/23 1959  CKTOTAL 100     ECG: Ordered Personally reviewed and interpreted by me showing: HR : 74 Rhythm:  NSR, LVH no evidence of ischemic changes QTC 494    The recent clinical data is shown below. Vitals:   12/30/23 1750 12/30/23 1800  BP:  112/74  Pulse:  74  Resp:  18  Temp:  98.1 F (36.7 C)  TempSrc:  Oral  SpO2:  100%  Weight: 65.1 kg     WBC     Component Value Date/Time   WBC 10.0 12/30/2023 1959   LYMPHSABS 1.7 12/30/2023 1959   MONOABS 0.7 12/30/2023 1959   EOSABS 0.0 12/30/2023 1959   BASOSABS 0.0 12/30/2023 1959         UA   ordered     Results for orders placed or performed during the hospital encounter of 01/30/23  SARS Coronavirus 2 by RT PCR (hospital order, performed in [facility] hospital lab) *cepheid single result test* Anterior Nasal Swab     Status: None   Collection Time: 01/30/23  4:13 PM   Specimen: Anterior Nasal Swab  Result Value Ref Range Status   SARS Coronavirus 2 by RT PCR  NEGATIVE NEGATIVE Final    Comment: Performed at [facility] Lab, [address] [place], [place] [address]  Respiratory (~20 pathogens) panel by PCR     Status: Abnormal   Collection Time: 01/30/23  4:14 PM   Specimen: Nasopharyngeal Swab; Respiratory  Result Value Ref Range Status   Adenovirus NOT DETECTED NOT DETECTED Final   Coronavirus 229E NOT DETECTED NOT DETECTED Final    Comment: (NOTE) The Coronavirus on the Respiratory Panel, DOES NOT test for the novel  Coronavirus (2019 nCoV)    Coronavirus HKU1 NOT DETECTED NOT DETECTED Final   Coronavirus NL63 NOT DETECTED NOT DETECTED Final   Coronavirus OC43 NOT DETECTED NOT DETECTED Final   Metapneumovirus NOT DETECTED NOT DETECTED Final   Rhinovirus / Enterovirus DETECTED (A) NOT DETECTED Final   Influenza A NOT DETECTED NOT DETECTED Final   Influenza B NOT DETECTED NOT DETECTED Final   Parainfluenza Virus 1 NOT DETECTED NOT DETECTED Final   Parainfluenza Virus 2 NOT DETECTED NOT DETECTED Final   Parainfluenza Virus 3 NOT DETECTED NOT DETECTED Final   Parainfluenza Virus 4 NOT DETECTED NOT  DETECTED Final   Respiratory Syncytial Virus NOT DETECTED NOT DETECTED Final   Bordetella pertussis NOT DETECTED NOT DETECTED Final   Bordetella Parapertussis NOT DETECTED NOT DETECTED Final   Chlamydophila pneumoniae NOT DETECTED NOT DETECTED Final   Mycoplasma pneumoniae NOT DETECTED NOT DETECTED Final    Comment: Performed at [facility] Lab, [address] [place], [place] [address]  Culture, blood (Routine X 2) w Reflex to ID Panel     Status: None   Collection Time: 01/30/23  4:44 PM   Specimen: BLOOD RIGHT ARM  Result Value Ref Range Status   Specimen Description BLOOD RIGHT ARM  Final   Special Requests   Final    BOTTLES DRAWN AEROBIC AND ANAEROBIC Blood Culture results may not be optimal due to an inadequate volume of blood received in culture bottles   Culture   Final    NO GROWTH 5 DAYS Performed at [facility]  Lab, [address] [place], [place] [address]    Report Status 02/04/2023 FINAL  Final  Culture, blood (Routine X 2) w Reflex to ID Panel     Status: None   Collection Time: 01/30/23  4:46 PM   Specimen: BLOOD RIGHT ARM  Result Value Ref Range Status   Specimen Description BLOOD RIGHT ARM  Final   Special Requests   Final    BOTTLES DRAWN AEROBIC AND ANAEROBIC Blood Culture results may not be optimal due to an inadequate volume of blood received in culture bottles   Culture   Final    NO GROWTH 5 DAYS Performed at [facility] Lab, [address], [place], [place] [address]    Report Status 02/04/2023 FINAL  Final  Surgical pcr screen     Status: None   Collection Time: 02/02/23  3:00 PM   Specimen: Nasal Mucosa; Nasal Swab  Result Value Ref Range Status   MRSA, PCR NEGATIVE NEGATIVE Final   Staphylococcus aureus NEGATIVE NEGATIVE Final    Comment: (NOTE) The Xpert SA Assay (FDA approved for NASAL specimens in patients 61 years of age and older), is one component of a comprehensive surveillance program. It is not intended to diagnose infection nor to guide or monitor treatment. Performed at [facility] Lab, [address] [place], [place] [address]   Surgical PCR screen     Status: None   Collection Time: 02/03/23  4:57 AM   Specimen: Nasal Mucosa; Nasal Swab  Result Value Ref Range Status   MRSA, PCR NEGATIVE NEGATIVE Final   Staphylococcus aureus NEGATIVE NEGATIVE Final    Comment: (NOTE) The Xpert SA Assay (FDA approved for NASAL specimens in patients 55 years of age and older), is one component of a comprehensive surveillance program. It is not intended to diagnose infection nor to guide or monitor treatment. Performed at [facility] Lab, [address], [place], [place] [address]   Aerobic/Anaerobic Culture w Gram Stain (surgical/deep wound)     Status: None   Collection Time: 02/03/23  3:45 PM   Specimen: PATH Benign ortho; Tissue  Result Value Ref Range  Status   Specimen Description TISSUE  Final   Special Requests LEFT FOOT ABSCESS TISSUE  Final   Gram Stain   Final    RARE WBC PRESENT,BOTH PMN AND MONONUCLEAR NO ORGANISMS SEEN    Culture   Final    No growth aerobically or anaerobically. Performed at [facility] Lab, [address] [place], [place] [address]  Report Status 02/10/2023 FINAL  Final    ________________________________________________________________  Arterial ***Venous  Blood Gas result:  pH *** pCO2 ***; pO2 ***;     %O2 Sat ***.  ABG    Component Value Date/Time   PHART 7.332 (L) 04/03/2020 2009   PCO2ART 36.8 04/03/2020 2009   PO2ART 87 04/03/2020 2009   HCO3 19.7 (L) 04/03/2020 2009   TCO2 23 03/03/2021 0850   ACIDBASEDEF 6.0 (H) 04/03/2020 2009   O2SAT 67.0 04/05/2020 0210       __________________________________________________________ No results for input(s): NA, K, CO2, GLUCOSE, BUN, CREATININE, CALCIUM , MG, PHOS in the last 168 hours.  Cr  * stable,  Up from baseline see below Lab Results  Component Value Date   CREATININE 1.54 (H) 11/04/2023   CREATININE 1.30 (H) 02/04/2023   CREATININE 1.39 (H) 02/03/2023    No results for input(s): AST, ALT, ALKPHOS, BILITOT, PROT, ALBUMIN  in the last 168 hours. Lab Results  Component Value Date   CALCIUM  9.4 11/04/2023   PHOS 2.7 02/01/2023          Plt: Lab Results  Component Value Date   PLT 142 (L) 11/04/2023        No results for input(s): WBC, NEUTROABS, HGB, HCT, MCV, PLT in the last 168 hours.  HG/HCT * stable,  Down *Up from baseline see below    Component Value Date/Time   HGB 15.6 11/04/2023 1103   HCT 46.1 11/04/2023 1103   MCV 98 (H) 11/04/2023 1103      No results for input(s): LIPASE, AMYLASE in the last 168 hours. No results for input(s): AMMONIA in the last 168 hours.    _______________________________________________ Hospitalist was called for admission for  n/vD   The following Work up has been ordered so far:  Orders Placed This Encounter  Procedures  . EKG  . EKG     OTHER Significant initial  Findings:  labs showing:     DM  labs:  HbA1C: Recent Labs    01/30/23 1646 11/09/23 1117  HGBA1C 7.6* 6.6*       CBG (last 3)  No results for input(s): GLUCAP in the last 72 hours.        Cultures:    Component Value Date/Time   SDES TISSUE 02/03/2023 1545   SPECREQUEST LEFT FOOT ABSCESS TISSUE 02/03/2023 1545   CULT  02/03/2023 1545    No growth aerobically or anaerobically. Performed at [facility] Lab, [address] [place], [place] [address]    REPTSTATUS 02/10/2023 FINAL 02/03/2023 1545     Radiological Exams on Admission: No results found. _______________________________________________________________________________________________________ Latest  Blood pressure 112/74, pulse 74, temperature 98.1 F (36.7 C), temperature source Oral, resp. rate 18, weight 65.1 kg, SpO2 100%.   Vitals  labs and radiology finding personally reviewed  Review of Systems:    Pertinent positives include: nausea, vomiting, diarrhea chest pain,  Constitutional:  No weight loss, night sweats, Fevers, chills, fatigue, weight loss  HEENT:  No headaches, Difficulty swallowing,Tooth/dental problems,Sore throat,  No sneezing, itching, ear ache, nasal congestion, post nasal drip,  Cardio-vascular:  No Orthopnea, PND, anasarca, dizziness, palpitations.no Bilateral lower extremity swelling  GI:  No heartburn, indigestion, abdominal pain, , change in bowel habits, loss of appetite, melena, blood in stool, hematemesis Resp:  no shortness of breath at rest. No dyspnea on exertion, No excess mucus, no productive cough, No non-productive cough, No coughing up of blood.No change in color of mucus.No wheezing. Skin:  no  rash or lesions. No jaundice GU:  no dysuria, change in color of urine, no urgency or frequency. No straining to  urinate.  No flank pain.  Musculoskeletal:  No joint pain or no joint swelling. No decreased range of motion. No back pain.  Psych:  No change in mood or affect. No depression or anxiety. No memory loss.  Neuro: no localizing neurological complaints, no tingling, no weakness, no double vision, no gait abnormality, no slurred speech, no confusion  All systems reviewed and apart from HOPI all are negative _______________________________________________________________________________________________ Past Medical History:   Past Medical History:  Diagnosis Date  . Blind right eye   . Carotid artery disease    04/01/20: 60-79% RICA stenosis, 1-39% LICA stenosis  . Chronic kidney disease    ckd stage 3 a per dr hobart ronco 02-05-2021 epic  . Coronary artery disease    NSTEMI s/p CABG in 03/2020 (LIMA-->LAD/diag, L radial-->OM1/OM3); b.  . DM type 2   . Glaucoma of right eye, unspecified glaucoma type   . HFrEF (heart failure with reduced ejection fraction) (HCC)    Echo 6/22: EF 45, global HK, mild MR, RVSP 31.9  . Hypertension   . Ischemic cardiomyopathy   . PAD (peripheral artery disease)    occluded right axillary artery 03/29/20       Past Surgical History:  Procedure Laterality Date  . AMPUTATION Right 04/24/2020   Procedure: RIGHT 2ND AND 3RD TOE AMPUTATION;  Surgeon: Harden Jerona GAILS, MD;  Location: [facility] OR;  Service: Orthopedics;  Laterality: Right;  . AMPUTATION Right 10/22/2021   Procedure: RIGHT TRANSMETATARSAL AMPUTATION;  Surgeon: Harden Jerona GAILS, MD;  Location[facility] OR;  Service: Orthopedics;  Laterality: Right;  . AMPUTATION Left 09/04/2022   Procedure: LEFT 1ST RAY AMPUTATION;  Surgeon: Harden Jerona GAILS, MD;  Location: [facility] OR;  Service: Orthopedics;  Laterality: Left;  . APPLICATION OF WOUND VAC Right 10/22/2021   Procedure: APPLICATION OF WOUND VAC;  Surgeon: Harden Jerona GAILS, MD;  Location: [facility] OR;  Service: Orthopedics;  Laterality: Right;  . CENTRAL VENOUS CATHETER INSERTION  Left 04/03/2020   Procedure: INSERTION OF FEMORAL ARTERIAL LINE;  Surgeon: German Bartlett PEDLAR, MD;  Location: [facility] OR;  Service: Open Heart Surgery;  Laterality: Left;  . CORONARY ARTERY BYPASS GRAFT N/A 04/03/2020   Procedure: CORONARY ARTERY BYPASS GRAFTING (CABG) TIMES FOUR USING LEFT INTERNAL MAMMARY ARTERY AND LEFT RADIAL ARTERY.;  Surgeon: German Bartlett PEDLAR, MD;  Location: [facility] OR;  Service: Open Heart Surgery;  Laterality: N/A;  . CORONARY PRESSURE/FFR WITH 3D MAPPING N/A 03/29/2020   Procedure: Coronary Pressure Wire/FFR w/3D Mapping;  Surgeon: Claudene Victory ORN, MD;  Location: [facility] INVASIVE CV LAB;  Service: Cardiovascular;  Laterality: N/A;  . EYE SURGERY Right 01/21/2021   right eye cataract lens replacement surgery  . I & D EXTREMITY Left 07/01/2022   Procedure: LEFT FOOT DEBRIDEMENT AND FOREIGN BODY REMOVAL;  Surgeon: Harden Jerona GAILS, MD;  Location: [facility] OR;  Service: Orthopedics;  Laterality: Left;  . IR THORACENTESIS ASP PLEURAL SPACE W/IMG GUIDE  05/03/2020  . IR THORACENTESIS ASP PLEURAL SPACE W/IMG GUIDE  05/16/2020  . LEFT HEART CATH AND CORONARY ANGIOGRAPHY N/A 03/29/2020   Procedure: LEFT HEART CATH AND CORONARY ANGIOGRAPHY;  Surgeon: Claudene Victory ORN, MD;  Location: [facility] INVASIVE CV LAB;  Service: Cardiovascular;  Laterality: N/A;  . METATARSAL HEAD EXCISION Left 02/03/2023   Procedure: LEFT FOOT EXCISION MEDIAL CUNEIFORM;  Surgeon: Harden Jerona GAILS, MD;  Location: [facility] OR;  Service: Orthopedics;  Laterality: Left;  . PENILE PROSTHESIS IMPLANT N/A 03/03/2021   Procedure: PENILE PROTHESIS INFLATABLE COLOPLAST;  Surgeon: Carolee Sherwood JONETTA DOUGLAS, MD;  Location: [facility];  Service: Urology;  Laterality: N/A;  REQUESTING 2 HRS  . RADIAL ARTERY HARVEST Left 04/03/2020   Procedure: LEFT RADIAL ARTERY HARVEST;  Surgeon: German Bartlett PEDLAR, MD;  Location: [facility] OR;  Service: Open Heart Surgery;  Laterality: Left;  . TEE WITHOUT CARDIOVERSION N/A 04/03/2020   Procedure: TRANSESOPHAGEAL  ECHOCARDIOGRAM (TEE);  Surgeon: German Bartlett PEDLAR, MD;  Location: [facility] OR;  Service: Open Heart Surgery;  Laterality: N/A;  . UPPER EXTREMITY ANGIOGRAPHY  03/29/2020   Procedure: Upper Extremity Angiography;  Surgeon: Claudene Victory ORN, MD;  Location: [facility] INVASIVE CV LAB;  Service: Cardiovascular;;  rt.  arm    Social History:  Ambulatory  independently   reports that he has been smoking cigarettes. He has a 0.5 pack-year smoking history. He has been exposed to tobacco smoke. He has never used smokeless tobacco. He reports that he does not currently use alcohol after a past usage of about 1.0 standard drink of alcohol per week. He reports that he does not currently use drugs after having used the following drugs: Marijuana. Family History:  Family History  Problem Relation Age of Onset  . Diabetes Mother    ______________________________________________________________________________________________ Allergies: Allergies  Allergen Reactions  . Vancomycin       01/30/2023 had discrete hives, only in the arm where he was getting the infusion. Had brisk response to Benadryl . We then followed with a reduced rate of vancomycin , and he tolerated the entire dose with no further rash.        Prior to Admission medications   Medication Sig Start Date End Date Taking? Authorizing Provider  aspirin  325 MG tablet Take 162 mg by mouth in the morning. 04/07/20   [provider]  atorvastatin  (LIPITOR ) 80 MG tablet TAKE 1 TABLET BY MOUTH EVERY DAY 11/08/23   Lelon Hamilton T, PA-C  blood glucose meter kit and supplies by Other route as directed. Dispense based on patient and insurance preference. Use up to 2x times daily as directed. (FOR ICD-10 E10.9, E11.9).    [provider]  brimonidine  (ALPHAGAN ) 0.2 % ophthalmic solution Place 1 drop into the left eye 2 (two) times daily. 12/18/22     dorzolamide -timolol  (COSOPT ) 2-0.5 % ophthalmic solution Place 1 drop into the left eye 2 (two) times  daily. 12/18/22     empagliflozin  (JARDIANCE ) 25 MG TABS tablet Take 1 tablet (25 mg total) by mouth daily. 11/09/23   Shamleffer, Ibtehal Jaralla, MD  furosemide  (LASIX ) 20 MG tablet TAKE 1 TABLET BY MOUTH 3 TIMES  WEEKLY ON MONDAYS, WEDNESDAYS,  AND FRIDAYS 10/04/23   Monge, Damien C, NP  losartan  (COZAAR ) 25 MG tablet TAKE 1/2 TABLET BY MOUTH DAILY. 11/19/23   Wyn Jackee VEAR Mickey., NP  metoprolol  succinate (TOPROL -XL) 25 MG 24 hr tablet TAKE 1/2 TABLET BY MOUTH EVERY DAY 11/26/23   Dick, Ernest H Jr., NP  pentoxifylline  (TRENTAL ) 400 MG CR tablet TAKE 1 TABLET BY MOUTH 3 TIMES DAILY WITH MEALS. Patient not taking: Reported on 11/09/2023 03/31/23   Harden Jerona GAILS, MD  spironolactone  (ALDACTONE ) 25 MG tablet Take 0.5 tablets (12.5 mg total) by mouth daily. 11/26/23   Wyn Jackee VEAR Mickey., NP    ___________________________________________________________________________________________________ Physical Exam:    12/30/2023    6:00 PM 12/30/2023    5:50 PM 11/09/2023   11:09  AM  Vitals with BMI  Height   5' 9  Weight  143 lbs 10 oz 156 lbs  BMI   23.03  Systolic 112  132  Diastolic 74  80  Pulse 74  76     1. General:  in No Acute distress   Chronically ill -appearing 2. Psychological: Alert and  Oriented 3. Head/ENT:   Dry Mucous Membranes                          Head Non traumatic, neck supple                    Poor Dentition 4. SKIN: normal *** decreased Skin turgor,  Skin clean Dry and intact no rash    5. Heart: Regular rate and rhythm no*** Murmur, no Rub or gallop 6. Lungs: ***Clear to auscultation bilaterally, no wheezes or crackles   7. Abdomen: Soft, ***non-tender, Non distended *** obese ***bowel sounds present 8. Lower extremities: no clubbing, cyanosis, no ***edema 9. Neurologically Grossly intact, moving all 4 extremities equally *** strength 5 out of 5 in all 4 extremities cranial nerves II through XII intact 10. MSK: Normal range of motion    Chart has been  reviewed  ______________________________________________________________________________________________  Assessment/Plan  ***  Admitted for  N/V/D chest pain    Present on Admission: **None**     No problem-specific Assessment & Plan notes found for this encounter.    Other plan as per orders.  DVT prophylaxis:  SCD       Code Status:    Code Status: Prior FULL CODE   as per patient  I had personally discussed CODE STATUS with patient   ACP   none    Family Communication:   Family not at  Bedside    Diet heart healthy/diabetic   Disposition Plan:     To home once workup is complete and patient is stable   Following barriers for discharge:                             Chest pain  work up is complete                            Electrolytes corrected                                                         Will need consultants to evaluate patient prior to discharge                                               Consults called: ***  NONE   Admission status:  ED Disposition     None        Obs***  ***  inpatient     I Expect 2 midnight stay secondary to severity of patient's current illness need for inpatient interventions justified by the following: ***hemodynamic instability despite optimal treatment (tachycardia *hypotension * tachypnea *hypoxia, hypercapnia) *** Severe lab/radiological/exam abnormalities including:    There are no diagnoses linked to this encounter.  and extensive comorbidities including: *substance abuse  *Chronic pain *DM2  * CHF * CAD  * COPD/asthma *Morbid Obesity * CKD *dementia *liver disease *history of stroke with residual deficits *  malignancy, * sickle cell disease  History of amputation Chronic anticoagulation  That are currently affecting medical management.   I expect  patient to be hospitalized for 2 midnights requiring inpatient medical care.  Patient is at high risk for adverse outcome (such as loss of  life or disability) if not treated.  Indication for inpatient stay as follows:  Severe change from baseline regarding mental status Hemodynamic instability despite maximal medical therapy,  severe pain requiring acute inpatient management,  inability to maintain oral hydration   persistent chest pain despite medical management Need for operative/procedural  intervention New or worsening hypoxia ongoing suicidal ideations   Need for IV antibiotics, IV fluids,, IV pain medications, IV anticoagulation,  IV rate controling medications, IV antihypertensives need for biPAP Frequent labs    Level of care     tele  For  24H    Allayah Raineri 12/30/2023, 7:39 PM ***  [facility]     after 2 AM please page floor coverage   If 7AM-7PM, please contact the day team taking care of the patient using [email or web address]

## 2023-12-31 NOTE — Care Management Obs Status (Signed)
 MEDICARE OBSERVATION STATUS NOTIFICATION   Patient Details  Name: Curtis Watkins MRN: [id number] Date of Birth: 1955/05/23   Medicare Observation Status Notification Given:  Yes    Vonzell Arrie Sharps 12/31/2023, 3:07 PM

## 2024-01-01 ENCOUNTER — Other Ambulatory Visit (HOSPITAL_COMMUNITY): Payer: Self-pay

## 2024-01-01 DIAGNOSIS — N1831 Chronic kidney disease, stage 3a: Secondary | ICD-10-CM | POA: Diagnosis not present

## 2024-01-01 LAB — GLUCOSE, CAPILLARY
Glucose-Capillary: 100 mg/dL — ABNORMAL HIGH (ref 70–99)
Glucose-Capillary: 117 mg/dL — ABNORMAL HIGH (ref 70–99)
Glucose-Capillary: 99 mg/dL (ref 70–99)

## 2024-01-01 LAB — GASTROINTESTINAL PANEL BY PCR, STOOL (REPLACES STOOL CULTURE)

## 2024-01-01 MED ORDER — ASPIRIN 81 MG PO TBEC
81.0000 mg | DELAYED_RELEASE_TABLET | Freq: Every day | ORAL | 12 refills | Status: AC
  Filled 2024-01-01: qty 30, 30d supply, fill #0

## 2024-01-01 MED ORDER — ATORVASTATIN CALCIUM 80 MG PO TABS
80.0000 mg | ORAL_TABLET | Freq: Every day | ORAL | 1 refills | Status: AC
  Filled 2024-01-01: qty 90, 90d supply, fill #0

## 2024-01-01 NOTE — Discharge Summary (Addendum)
 Physician Discharge Summary  Curtis Watkins FMW:[id number] DOB: 1955/08/23 DOA: 12/30/2023  PCP: Jaycee Greig PARAS, NP  Admit date: 12/30/2023 Discharge date: 01/01/2024  Time spent: 25 minutes  Recommendations for Outpatient Follow-up:  Recommend outpatient follow-up with nephrology as well as with endocrinology Should be seen in the outpatient setting for [facility] cardiology visit probably within 2 weeks not emergently-please stress compliance with medical therapy, smoking cessation etc. 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 Sam last seen 11/09/2023 noted to be on Jardiance  and not taking Amaryl  Left foot wound status post left medial cuneiform excision during hospitalization 12/7-12/01/2023 Dr. Harden CABG X4----04/03/20 with HFrEF occasional hypotension HTN CKD 3B follows with Dr. Tobie of nephrology Still smoker 1 pack/day precontemplative  As below admitted with nausea vomiting chest pain  Chronology  11/04/2023 seen for annual cardiology visit-noted to be stable 11/6 evaluation [facility] for profuse nausea vomiting indigestion nausea vomiting no hematemesis no shortness of breath diaphoretic when vomiting pudding-like stool  Case was discussed with Dr. Delford cardiology secondary to troponin found to be 246 (last troponins 03/06/2022  were 160-EKG nonsuggestive of damage pattern with PR interval 0.12 QRS axis 60  degrees hyperacute T waves with suggestion of LVH slightly prolonged QTc no damage pattern no T wave inversion    Pertinent imaging/studies till date  11/7 echocardiogram-.  EF 60-65% normal diastolic dysfunction no LV abnormalities or WM issues -- mitral valve normal no stenosis or aortic valvular issues.   Assessment  & Plan :    Nausea vomiting diarrheal illness from foodborne illness C. difficile negative pathogen panel pending His symptoms however have completely resolved and he has not had a stool since admission I think that this is a self-limiting diarrheal illness and that he can go home without any further workup or antibiotics at this time Reported chest pain--NSTEMI on admission Previous CABG X4 2022 with HFrEF and complication of hypotension Troponin is elevated on admission trend flat ---echo as above not concerning for WM anomaly-- so heparin  stopped 11/7 Toprol -XL 12.5 Plavix  75 resumed Aldactone  0.5 tablets daily, Lasix  20 Monday Wednesday Friday at discharge Apparently he is taking 2 aspirin  a day instead of 81 mg so that has been corrected on his discharge MAR I see no mention of Plavix  in the last cardiology progress note from 10/2023 so we would not prescribe this for him We will call in the atorvastatin  to his pharmacy here at: Because he has been having trouble obtaining this at [facility] 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 Harden Wound looks clean outpatient follow-up CKD stage IIIb Outpatient follow-up with Dr. Rosan is directing his diuretics Still smoke  Discharge Exam: Vitals:   01/01/24 0449 01/01/24 0742  BP: 124/77   Pulse: 64   Resp: 16 17  Temp: 98.5 F (36.9 C) 98 F (36.7 C)  SpO2: 97%  Subj on day of d/c   Awake coherent looks well feels fair no distress No chest pain No chills no fever no nausea no  vomiting Ambulatory in the room   General Exam on discharge  EOMI NCAT no focal deficit no icterus no pallor Chest clear no added sound thin asthenic black male no distress ROM intact Abdomen soft no rebound no guarding S1-S2 no murmur sinus rhythm on monitors  Discharge Instructions    Allergies as of 01/01/2024       Reactions   Vancomycin     01/30/2023 had discrete hives, only in the arm where he was getting the infusion. Had brisk response to Benadryl . We then followed with a reduced rate of vancomycin , and he tolerated the entire dose with no further rash.         Medication List     STOP taking these medications    aspirin  325 MG tablet Replaced by: aspirin  EC 81 MG tablet       TAKE these medications    aspirin  EC 81 MG tablet Take 1 tablet (81 mg total) by mouth daily. Swallow whole. Replaces: aspirin  325 MG tablet   atorvastatin  80 MG tablet Commonly known as: LIPITOR  Take 1 tablet (80 mg total) by mouth daily.   blood glucose meter kit and supplies by Other route as directed. Dispense based on patient and insurance preference. Use up to 2x times daily as directed. (FOR ICD-10 E10.9, E11.9).   brimonidine  0.2 % ophthalmic solution Commonly known as: ALPHAGAN  Place 1 drop into the left eye 2 (two) times daily.   dorzolamide -timolol  2-0.5 % ophthalmic solution Commonly known as: COSOPT  Place 1 drop into the left eye 2 (two) times daily.   empagliflozin  25 MG Tabs tablet Commonly known as: Jardiance  Take 1 tablet (25 mg total) by mouth daily.   furosemide  20 MG tablet Commonly known as: LASIX  TAKE 1 TABLET BY MOUTH 3 TIMES  WEEKLY ON MONDAYS, WEDNESDAYS,  AND FRIDAYS   losartan  25 MG tablet Commonly known as: COZAAR  TAKE 1/2 TABLET BY MOUTH DAILY.   metoprolol  succinate 25 MG 24 hr tablet Commonly known as: TOPROL -XL TAKE 1/2 TABLET BY MOUTH EVERY DAY   spironolactone  25 MG tablet Commonly known as: ALDACTONE  Take 0.5 tablets (12.5 mg  total) by mouth daily.       Allergies  Allergen Reactions   Vancomycin       01/30/2023 had discrete hives, only in the arm where he was getting the infusion. Had brisk response to Benadryl . We then followed with a reduced rate of vancomycin , and he tolerated the entire dose with no further rash.         The results of significant diagnostics from this hospitalization (including imaging, microbiology, ancillary and laboratory) are listed below for reference.    Significant Diagnostic Studies: ECHOCARDIOGRAM COMPLETE Result Date: 12/31/2023    ECHOCARDIOGRAM REPORT   Patient Name:   Curtis Watkins Date of Exam: 12/31/2023 Medical Rec #:  [id number]   Height:       69.0 in Accession #:    [id number]  Weight:       143.5 lb Date of Birth:  Sep 17, 1955  BSA:          1.794 m Patient Age:    68 years    BP:           109/66 mmHg Patient Gender: M           HR:  78 bpm. Exam Location:  Inpatient Procedure: 2D Echo (Both Spectral and Color Flow Doppler were utilized during            procedure). Indications:    CP  History:        Patient has prior history of Echocardiogram examinations. CAD;                 Signs/Symptoms:Chest Pain.  Sonographer:    Norleen Amour Referring Phys: ANASTASSIA DOUTOVA IMPRESSIONS  1. Left ventricular ejection fraction, by estimation, is 60 to 65%. The left ventricle has normal function. The left ventricle has no regional wall motion abnormalities. Left ventricular diastolic parameters were normal.  2. Right ventricular systolic function is mildly reduced. The right ventricular size is normal. A is visualized in the 3. Tricuspid regurgitation signal is inadequate for assessing PA pressure.  3. The mitral valve is normal in structure. No evidence of mitral valve regurgitation. No evidence of mitral stenosis.  4. The aortic valve is tricuspid. Aortic valve regurgitation is not visualized. No aortic stenosis is present.  5. The inferior vena cava is normal in size with  greater than 50% respiratory variability, suggesting right atrial pressure of 3 mmHg. FINDINGS  Left Ventricle: Left ventricular ejection fraction, by estimation, is 60 to 65%. The left ventricle has normal function. The left ventricle has no regional wall motion abnormalities. The left ventricular internal cavity size was normal in size. There is  no left ventricular hypertrophy of the basal-septal segment. Left ventricular diastolic parameters were normal. Right Ventricle: The right ventricular size is normal. No increase in right ventricular wall thickness. Right ventricular systolic function is mildly reduced. Tricuspid regurgitation signal is inadequate for assessing PA pressure. Left Atrium: Left atrial size was normal in size. Right Atrium: Right atrial size was normal in size. Pericardium: There is no evidence of pericardial effusion. Mitral Valve: The mitral valve is normal in structure. No evidence of mitral valve regurgitation. No evidence of mitral valve stenosis. MV peak gradient, 4.2 mmHg. The mean mitral valve gradient is 1.0 mmHg. Tricuspid Valve: The tricuspid valve is normal in structure. Tricuspid valve regurgitation is trivial. No evidence of tricuspid stenosis. Aortic Valve: The aortic valve is tricuspid. Aortic valve regurgitation is not visualized. No aortic stenosis is present. Aortic valve mean gradient measures 2.0 mmHg. Aortic valve peak gradient measures 4.8 mmHg. Aortic valve area, by VTI measures 2.69 cm. Pulmonic Valve: The pulmonic valve was normal in structure. Pulmonic valve regurgitation is not visualized. No evidence of pulmonic stenosis. Aorta: The aortic root and ascending aorta are structurally normal, with no evidence of dilitation. Venous: The inferior vena cava is normal in size with greater than 50% respiratory variability, suggesting right atrial pressure of 3 mmHg. IAS/Shunts: No atrial level shunt detected by color flow Doppler. Additional Comments: A is visualized in  the 3.  LEFT VENTRICLE PLAX 2D LVIDd:         4.20 cm      Diastology LVIDs:         2.90 cm      LV e' medial:    5.98 cm/s LV PW:         0.80 cm      LV E/e' medial:  12.0 LV IVS:        0.90 cm      LV e' lateral:   6.20 cm/s LVOT diam:     2.00 cm      LV E/e' lateral: 11.6 LV SV:  67 LV SV Index:   37 LVOT Area:     3.14 cm LV IVRT:       109 msec  LV Volumes (MOD) LV vol d, MOD A2C: 143.0 ml LV vol d, MOD A4C: 108.0 ml LV vol s, MOD A2C: 34.0 ml LV vol s, MOD A4C: 32.3 ml LV SV MOD A2C:     109.0 ml LV SV MOD A4C:     108.0 ml LV SV MOD BP:      99.5 ml RIGHT VENTRICLE            IVC RV Basal diam:  2.90 cm    IVC diam: 1.40 cm RV S prime:     9.14 cm/s TAPSE (M-mode): 1.3 cm     PULMONARY VEINS                            Diastolic Velocity: 39.80 cm/s                            S/D Velocity:       1.10                            Systolic Velocity:  45.60 cm/s LEFT ATRIUM             Index        RIGHT ATRIUM           Index LA diam:        3.10 cm 1.73 cm/m   RA Area:     11.30 cm LA Vol (A2C):   53.1 ml 29.59 ml/m  RA Volume:   26.00 ml  14.49 ml/m LA Vol (A4C):   42.8 ml 23.85 ml/m LA Biplane Vol: 49.6 ml 27.64 ml/m  AORTIC VALVE                    PULMONIC VALVE AV Area (Vmax):    2.79 cm     PV Vmax:       0.93 m/s AV Area (Vmean):   3.16 cm     PV Peak grad:  3.4 mmHg AV Area (VTI):     2.69 cm AV Vmax:           110.00 cm/s AV Vmean:          66.500 cm/s AV VTI:            0.249 m AV Peak Grad:      4.8 mmHg AV Mean Grad:      2.0 mmHg LVOT Vmax:         97.80 cm/s LVOT Vmean:        66.800 cm/s LVOT VTI:          0.213 m LVOT/AV VTI ratio: 0.86  AORTA Ao Root diam: 3.00 cm Ao Asc diam:  2.30 cm MITRAL VALVE MV Area (PHT): 2.47 cm     SHUNTS MV Area VTI:   2.71 cm     Systemic VTI:  0.21 m MV Peak grad:  4.2 mmHg     Systemic Diam: 2.00 cm MV Mean grad:  1.0 mmHg MV Vmax:       1.03 m/s MV Vmean:      49.8 cm/s MV Decel Time: 307 msec MV E velocity: 71.80 cm/s MV A velocity: 116.00  cm/s MV E/A ratio:  0.62 Sunit Tolia Electronically signed by Madonna Large Signature Date/Time: 12/31/2023/3:22:40 PM    Final    DG ABD ACUTE 2+V W 1V CHEST Result Date: 12/31/2023 EXAM: UPRIGHT AND SUPINE XRAY VIEWS OF THE ABDOMEN AND 4 VIEW(S) OF THE CHEST 12/31/2023 06:47:00 AM COMPARISON: 12/30/2023 CLINICAL HISTORY: Vomiting FINDINGS: LUNGS AND PLEURA: No consolidation or pulmonary edema. No pleural effusion or pneumothorax. HEART AND MEDIASTINUM: Post CABG changes. Aortic atherosclerosis. Sternotomy wires. BOWEL: The bowel gas pattern is nonobstructed,  unremarkable. . No bowel obstruction. PERITONEUM AND SOFT TISSUES: Vascular calcifications are noted within the pelvis. No free air. BONES: No acute osseous abnormality. IMPRESSION: 1. No acute cardiopulmonary or abdominal process identified. Electronically signed by: Helayne Hurst MD 12/31/2023 06:55 AM EST RP Workstation: [id number]   DG Chest Port 1 View Result Date: 12/30/2023 EXAM: 1 VIEW(S) XRAY OF THE CHEST 12/30/2023 08:35:00 PM COMPARISON: 1 / 11 / 24. CLINICAL HISTORY: Dyspnea FINDINGS: LUNGS AND PLEURA: No focal pulmonary opacity. No pulmonary edema. No pleural effusion. No pneumothorax. HEART AND MEDIASTINUM: Post median sternotomy and CABG. Aortic atherosclerosis. BONES AND SOFT TISSUES: Thoracic degenerative changes. Median sternotomy wires noted. IMPRESSION: 1. No acute cardiopulmonary process. Electronically signed by: Norman Gatlin MD 12/30/2023 08:58 PM EST RP Workstation: [id number]    Microbiology: No results found for this or any previous visit (from the past 240 hours).   Labs: Basic Metabolic Panel: Recent Labs  Lab 12/30/23 1959 12/31/23 0404  NA 139 137  K 3.9 3.7  CL 108 105  CO2 20* 20*  GLUCOSE 112* 97  BUN 32* 32*  CREATININE 1.56* 1.60*  CALCIUM  8.9 8.7*  MG 2.1 2.1  PHOS 3.9 2.9   Liver Function Tests: Recent Labs  Lab 12/30/23 1959 12/31/23 0404  AST 15 15  ALT 12 9  ALKPHOS 72 64  BILITOT 0.9  1.0  PROT 7.1 6.7  ALBUMIN  3.6 3.4*   No results for input(s): LIPASE, AMYLASE in the last 168 hours. No results for input(s): AMMONIA in the last 168 hours. CBC: Recent Labs  Lab 12/30/23 1959 12/31/23 0404  WBC 10.0 9.7  NEUTROABS 7.6  --   HGB 14.7 14.2  HCT 42.3 40.6  MCV 93.6 93.5  PLT 181 157   Cardiac Enzymes: Recent Labs  Lab 12/30/23 1959  CKTOTAL 100   BNP: BNP (last 3 results) No results for input(s): BNP in the last 8760 hours.  ProBNP (last 3 results) No results for input(s): PROBNP in the last 8760 hours.  CBG: Recent Labs  Lab 12/31/23 1636 12/31/23 1941 01/01/24 0032 01/01/24 0501 01/01/24 0744  GLUCAP 156* 160* 117* 100* 99    Signed:  Colen Grimes MD   [facility] 01/01/2024, 8:56 AM

## 2024-01-03 ENCOUNTER — Ambulatory Visit: Payer: Self-pay | Admitting: Family

## 2024-01-14 ENCOUNTER — Ambulatory Visit (HOSPITAL_COMMUNITY): Payer: Self-pay

## 2024-01-15 NOTE — Progress Notes (Deleted)
 Cardiology Office Note:  .   Date:  01/15/2024  ID:  Curtis Watkins, DOB 1955-07-30, MRN [id number] PCP: Jaycee Greig PARAS, NP  [facility] Providers Cardiologist:  None Cardiology APP:  Lelon Glendia DASEN, PA-C {  History of Present Illness: .   Curtis Watkins is a 68 y.o. male  with PMHx of CAD s/p NSTEMI 03/2020 with CABG x 4 (LIMA-diagonal, LIMA-LAD, left radial artery to OM1, and OM 3), pleural effusion s/p thoracentesis in 04/2020, HimpEF (EF as low as 25-30% in 05/2020 via ECHO with most recent EF of 60-65% in 12/2023), carotid artery stenosis (2022: 60-79% RICA, 1-39% LICA stenosis), PAD (Upper extremity angiography 2022: occluded right axillary artery), HTN, HLD, tobacco abuse, CKD stage IIIa, DM type II who reports to [facility] office for hospital follow up.   Last seen in heartcare OV 11/04/2023 by Jackee Alberts, NP for follow up. Doing well from cardiac standpoint with out any cardiac complaints. BP was elevated in office. Recommended home BP log and consider switching Losartan  to Entresto if BP remained elevated and heart function on ECHO remain suboptimals. Continued on ASA 81 mg daily, Lipitor  80 mg daily, Jardiance  25 mg daily, Lasix  20 mg MWF, Losartan  12.5 mg daily, Toprol  XL 12.5 mg daily, and Sprio 12.5 mg daily.  Ordered follow up ECHO, which was completed in hospitalization below.   Recent hospitalization at [facility] 11/6-09/2023 for profuse n/v, indigestion, and chest pain. Diagnosed with NSTEMI due to elevated tn 246 but flat trend. ED discussed case with cardiology Dr. Sydney who felt ACS was unlikely. Echo 11/7 showed EF 60-65%, normal diastolic dysfunction, no LV abnormalities, no RWMA, mildly reduced RV function, and no significant valvular abnormalities. Continued on PTA cardiology meds.   Today, reports ### and denies ###.  Denies chest pain, shortness of breath, palpitations, syncope, presyncope, dizziness, orthopnea, PND, swelling or significant weight changes, acute bleeding,  or claudication.   Reports compliance with medications.  Dietary habitats:  Activity level:  Social: Denies tobacco use/alcohol/drug use  Denies any recent hospitalizations or visits to the emergency department.   Coronary artery disease involving native coronary artery of native heart without angina pectoris S/P CABG x 4 Hyperlipidemia LDL goal <55 s/p NSTEMI 03/2020 with CABG x 4 (LIMA-diagonal, LIMA-LAD, left radial artery to OM1, and OM 3) Denies angina symptoms. No need for further ischemic evaluation at this time.  LDL? LFT? Continue ASA 81 mg, Lipitor  80 mg daily, Losartan  12.5 mg daily, Toprol  XL 12.5 mg daily  Heart failure with improved ejection fraction (HFimpEF) (HCC) EF as low as 25-30% in 05/2020 via ECHO with most recent EF of 60-65% in 12/2023 ECHO 12/31/2023 showed EF 60-65%, normal diastolic dysfunction, no LV abnormalities, no RWMA, mildly reduced RV function, and no significant valvular abnormalities. Denies SOB, edema, orthopnea, or PND. Appears Euvolemic on exam.  Continue on Losartan  12.5 mg daily, Toprol  XL 12.5 mg daily, Sprio 12.5 mg daily, Jardiance  25 mg daily  GDMT limited due to low blood pressure of .SABRA or AKI/CKD Encouraged low sodium diet, fluid restriction <2L, and daily weights.  Educated to contact our office for weight gain of 2 lbs overnight or 5 lbs in one week. ED precautions discussed.    Essential hypertension Reports well controlled Home BP:  BP this OV well controlled today:  Managed by GDMT as above.  Encourage physical activity for 150 minutes per week and heart healthy low sodium diet. Discussed limiting sodium intake to < 2 grams daily.  PAD (peripheral artery disease) Upper extremity angiography 2022: occluded right axillary artery. Continue current GDMT with ASA 81 mg, Lipitor  80 mg   Bilateral carotid artery disease, unspecified type Carotid Stenosis  Carotid US  2022: 60-79% RICA, 1-39% LICA stenosis No bruits on exam. Denies  presyncope/syncope.  Continue to monitor.  Order Carotid US .   Tobacco use Currently smokes # PPD  Dicussed cessation but no desire for cessation     ROS: 10 point review of system has been reviewed and considered negative except ones been listed in the HPI.   Studies Reviewed: SABRA   Cath 2022 Diagnostic Dominance: Right  Diabetic with severe two-vessel disease including the LAD and complex disease in the circumflex. Ischemic cardiomyopathy with LVEF 35 to 40%.  LVEDP is less than 10 mmHg. Left main is widely patent Right coronary is widely patent but contains diffuse atherosclerosis without focal narrowing. 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   RECOMMENDATIONS: Continue IV heparin  Continue to treat heart failure Watch kidney function T CTS evaluation to consider coronary artery bypass grafting with LIMA to the LAD.  SVG to circumflex or arterial graft assuming that the left radial has flow.SABRA Catena US  Doppler Pre- CABG 2022 Summary:  Right Carotid: Velocities in the right ICA are consistent with a 60-79% stenosis.  Left Carotid: Velocities in the left ICA are consistent with a 1-39% stenosis.  Vertebrals: Bilateral vertebral arteries demonstrate antegrade flow.  Right ABI: Resting right ankle-brachial index indicates noncompressible  right lower extremity arteries.  Left ABI: Resting left ankle-brachial index indicates noncompressible left  lower extremity arteries.  Right Upper Extremity: Doppler waveforms decrease 50% with right radial  compression. Doppler waveforms decrease 50% with right ulnar compression.  Left Upper Extremity: Doppler waveforms remain within normal limits with  left radial compression. Doppler waveforms remain within normal limits  with left ulnar compression.   ECHO IMPRESSIONS 12/31/2023  1. Left ventricular ejection fraction, by estimation, is 60 to 65%. The left ventricle has normal function. The left ventricle has no regional wall motion  abnormalities. Left ventricular diastolic parameters were normal.   2. Right ventricular systolic function is mildly reduced. The right  ventricular size is normal. A is visualized in the 3. Tricuspid  regurgitation signal is inadequate for assessing PA pressure.   3. The mitral valve is normal in structure. No evidence of mitral valve regurgitation. No evidence of mitral stenosis.   4. The aortic valve is tricuspid. Aortic valve regurgitation is not  visualized. No aortic stenosis is present.   5. The inferior vena cava is normal in size with greater than 50% respiratory variability, suggesting right atrial pressure of 3 mmHg.  Risk Assessment/Calculations:   {Does this patient have ATRIAL FIBRILLATION?:[phone]} No BP recorded.  {Refresh Note OR Click here to enter BP  :1}***       Physical Exam:   VS:  There were no vitals taken for this visit.   Wt Readings from Last 3 Encounters:  12/31/23 143 lb 8.3 oz (65.1 kg)  11/09/23 156 lb (70.8 kg)  11/04/23 152 lb (68.9 kg)    GEN: Well nourished, well developed in no acute distress while sitting in chair.  NECK: No JVD; No carotid bruits CARDIAC: ***RRR, no murmurs, rubs, gallops RESPIRATORY:  Clear to auscultation without rales, wheezing or rhonchi  ABDOMEN: Soft, non-tender, non-distended EXTREMITIES:  No edema; No deformity   ASSESSMENT AND PLAN: .   ***    {Are you ordering a CV Procedure (e.g.  stress test, cath, DCCV, TEE, etc)?   Press F2        :[id number]}  Dispo: ***  Signed, Lorette CINDERELLA Kapur, PA-C

## 2024-01-17 ENCOUNTER — Ambulatory Visit: Admitting: Physician Assistant

## 2024-01-17 DIAGNOSIS — Z951 Presence of aortocoronary bypass graft: Secondary | ICD-10-CM

## 2024-01-17 DIAGNOSIS — Z720 Tobacco use: Secondary | ICD-10-CM

## 2024-01-17 DIAGNOSIS — I5020 Unspecified systolic (congestive) heart failure: Secondary | ICD-10-CM

## 2024-01-17 DIAGNOSIS — I2510 Atherosclerotic heart disease of native coronary artery without angina pectoris: Secondary | ICD-10-CM

## 2024-01-17 DIAGNOSIS — I739 Peripheral vascular disease, unspecified: Secondary | ICD-10-CM

## 2024-01-17 DIAGNOSIS — I10 Essential (primary) hypertension: Secondary | ICD-10-CM

## 2024-01-17 DIAGNOSIS — E785 Hyperlipidemia, unspecified: Secondary | ICD-10-CM

## 2024-01-17 DIAGNOSIS — I779 Disorder of arteries and arterioles, unspecified: Secondary | ICD-10-CM

## 2024-05-03 ENCOUNTER — Ambulatory Visit

## 2024-05-09 ENCOUNTER — Ambulatory Visit: Admitting: Internal Medicine

## 2024-06-15 ENCOUNTER — Ambulatory Visit
